# Patient Record
Sex: Male | Born: 1935 | Race: Black or African American | Hispanic: No | Marital: Married | Smoking: Former smoker
Health system: Southern US, Community
[De-identification: ages and names within clinical notes are randomized; demographics above are authoritative.]

## PROBLEM LIST (undated history)

## (undated) DIAGNOSIS — T7840XA Allergy, unspecified, initial encounter: Secondary | ICD-10-CM

## (undated) DIAGNOSIS — K2970 Gastritis, unspecified, without bleeding: Secondary | ICD-10-CM

## (undated) DIAGNOSIS — K449 Diaphragmatic hernia without obstruction or gangrene: Secondary | ICD-10-CM

## (undated) DIAGNOSIS — R7309 Other abnormal glucose: Secondary | ICD-10-CM

## (undated) DIAGNOSIS — K219 Gastro-esophageal reflux disease without esophagitis: Secondary | ICD-10-CM

## (undated) DIAGNOSIS — C159 Malignant neoplasm of esophagus, unspecified: Secondary | ICD-10-CM

## (undated) DIAGNOSIS — Z9289 Personal history of other medical treatment: Secondary | ICD-10-CM

## (undated) DIAGNOSIS — E785 Hyperlipidemia, unspecified: Secondary | ICD-10-CM

## (undated) DIAGNOSIS — Z9221 Personal history of antineoplastic chemotherapy: Secondary | ICD-10-CM

## (undated) DIAGNOSIS — K5730 Diverticulosis of large intestine without perforation or abscess without bleeding: Secondary | ICD-10-CM

## (undated) DIAGNOSIS — I499 Cardiac arrhythmia, unspecified: Secondary | ICD-10-CM

## (undated) DIAGNOSIS — J449 Chronic obstructive pulmonary disease, unspecified: Secondary | ICD-10-CM

## (undated) DIAGNOSIS — E039 Hypothyroidism, unspecified: Secondary | ICD-10-CM

## (undated) DIAGNOSIS — C61 Malignant neoplasm of prostate: Secondary | ICD-10-CM

## (undated) DIAGNOSIS — F419 Anxiety disorder, unspecified: Secondary | ICD-10-CM

## (undated) DIAGNOSIS — I2510 Atherosclerotic heart disease of native coronary artery without angina pectoris: Secondary | ICD-10-CM

## (undated) DIAGNOSIS — K222 Esophageal obstruction: Secondary | ICD-10-CM

## (undated) DIAGNOSIS — Z923 Personal history of irradiation: Secondary | ICD-10-CM

## (undated) HISTORY — PX: OTHER SURGICAL HISTORY: SHX169

## (undated) HISTORY — DX: Other abnormal glucose: R73.09

## (undated) HISTORY — PX: COLONOSCOPY: SHX174

## (undated) HISTORY — DX: Personal history of irradiation: Z92.3

## (undated) HISTORY — DX: Hypothyroidism, unspecified: E03.9

## (undated) HISTORY — PX: TONSILLECTOMY: SUR1361

## (undated) HISTORY — DX: Allergy, unspecified, initial encounter: T78.40XA

## (undated) HISTORY — DX: Personal history of other medical treatment: Z92.89

## (undated) HISTORY — DX: Diaphragmatic hernia without obstruction or gangrene: K44.9

## (undated) HISTORY — DX: Malignant neoplasm of prostate: C61

## (undated) HISTORY — DX: Chronic obstructive pulmonary disease, unspecified: J44.9

## (undated) HISTORY — DX: Hyperlipidemia, unspecified: E78.5

## (undated) HISTORY — PX: POLYPECTOMY: SHX149

## (undated) HISTORY — DX: Gastritis, unspecified, without bleeding: K29.70

## (undated) HISTORY — DX: Gastro-esophageal reflux disease without esophagitis: K21.9

## (undated) HISTORY — DX: Anxiety disorder, unspecified: F41.9

## (undated) HISTORY — DX: Esophageal obstruction: K22.2

## (undated) HISTORY — DX: Diverticulosis of large intestine without perforation or abscess without bleeding: K57.30

## (undated) HISTORY — DX: Malignant neoplasm of esophagus, unspecified: C15.9

## (undated) HISTORY — DX: Cardiac arrhythmia, unspecified: I49.9

## (undated) HISTORY — DX: Atherosclerotic heart disease of native coronary artery without angina pectoris: I25.10

## (undated) HISTORY — PX: UPPER GASTROINTESTINAL ENDOSCOPY: SHX188

---

## 1998-07-26 ENCOUNTER — Ambulatory Visit (HOSPITAL_COMMUNITY): Admission: RE | Admit: 1998-07-26 | Discharge: 1998-07-26 | Payer: Self-pay | Admitting: Internal Medicine

## 1998-07-26 ENCOUNTER — Encounter: Payer: Self-pay | Admitting: Internal Medicine

## 2001-06-08 ENCOUNTER — Encounter: Payer: Self-pay | Admitting: Internal Medicine

## 2001-09-24 ENCOUNTER — Encounter (INDEPENDENT_AMBULATORY_CARE_PROVIDER_SITE_OTHER): Payer: Self-pay | Admitting: *Deleted

## 2001-10-02 ENCOUNTER — Encounter (INDEPENDENT_AMBULATORY_CARE_PROVIDER_SITE_OTHER): Payer: Self-pay | Admitting: *Deleted

## 2001-10-02 ENCOUNTER — Encounter: Payer: Self-pay | Admitting: Gastroenterology

## 2001-10-07 ENCOUNTER — Ambulatory Visit (HOSPITAL_COMMUNITY): Admission: RE | Admit: 2001-10-07 | Discharge: 2001-10-07 | Payer: Self-pay | Admitting: Gastroenterology

## 2001-10-07 ENCOUNTER — Encounter: Payer: Self-pay | Admitting: Gastroenterology

## 2001-10-27 ENCOUNTER — Encounter (INDEPENDENT_AMBULATORY_CARE_PROVIDER_SITE_OTHER): Payer: Self-pay | Admitting: *Deleted

## 2003-01-29 ENCOUNTER — Inpatient Hospital Stay (HOSPITAL_COMMUNITY): Admission: RE | Admit: 2003-01-29 | Discharge: 2003-01-31 | Payer: Self-pay | Admitting: Orthopedic Surgery

## 2004-02-09 ENCOUNTER — Ambulatory Visit: Payer: Self-pay | Admitting: Internal Medicine

## 2004-02-24 ENCOUNTER — Ambulatory Visit: Payer: Self-pay | Admitting: Internal Medicine

## 2005-02-14 ENCOUNTER — Ambulatory Visit: Payer: Self-pay | Admitting: Internal Medicine

## 2005-02-19 ENCOUNTER — Ambulatory Visit: Payer: Self-pay | Admitting: Internal Medicine

## 2006-02-20 ENCOUNTER — Ambulatory Visit: Payer: Self-pay | Admitting: Internal Medicine

## 2006-02-26 ENCOUNTER — Ambulatory Visit: Payer: Self-pay | Admitting: Internal Medicine

## 2006-02-26 LAB — CONVERTED CEMR LAB
AST: 32 units/L (ref 0–37)
Basophils Absolute: 0 10*3/uL (ref 0.0–0.1)
HDL: 56 mg/dL (ref >39.0)
Ketones, ur: NEGATIVE mg/dL
Leukocytes, UA: NEGATIVE
Lymphocytes Relative: 26.2 % (ref 12.0–46.0)
MCV: 87.7 fL (ref 78.0–100.0)
Monocytes Relative: 10.1 % (ref 3.0–11.0)
Neutro Abs: 3.1 10*3/uL (ref 1.4–7.7)
PSA: 1.28 ng/mL (ref 0.10–4.00)
Platelets: 262 10*3/uL (ref 150–400)
Potassium: 3.8 meq/L (ref 3.5–5.1)
Sodium: 137 meq/L (ref 135–145)
TSH: 2.48 microintl units/mL (ref 0.35–5.50)
Triglyceride fasting, serum: 120 mg/dL (ref 0–149)
pH: 5.5 (ref 5.0–8.0)

## 2006-04-03 ENCOUNTER — Ambulatory Visit: Payer: Self-pay | Admitting: Internal Medicine

## 2006-11-25 ENCOUNTER — Emergency Department (HOSPITAL_COMMUNITY): Admission: EM | Admit: 2006-11-25 | Discharge: 2006-11-25 | Payer: Self-pay | Admitting: Emergency Medicine

## 2006-12-03 ENCOUNTER — Emergency Department (HOSPITAL_COMMUNITY): Admission: EM | Admit: 2006-12-03 | Discharge: 2006-12-03 | Payer: Self-pay | Admitting: Emergency Medicine

## 2007-01-27 ENCOUNTER — Encounter: Payer: Self-pay | Admitting: Internal Medicine

## 2007-01-27 DIAGNOSIS — I10 Essential (primary) hypertension: Secondary | ICD-10-CM | POA: Insufficient documentation

## 2007-01-27 DIAGNOSIS — K5730 Diverticulosis of large intestine without perforation or abscess without bleeding: Secondary | ICD-10-CM | POA: Insufficient documentation

## 2007-01-27 DIAGNOSIS — E785 Hyperlipidemia, unspecified: Secondary | ICD-10-CM

## 2007-02-10 ENCOUNTER — Encounter: Payer: Self-pay | Admitting: Internal Medicine

## 2007-02-28 ENCOUNTER — Ambulatory Visit: Payer: Self-pay | Admitting: Internal Medicine

## 2007-02-28 DIAGNOSIS — J019 Acute sinusitis, unspecified: Secondary | ICD-10-CM | POA: Insufficient documentation

## 2007-02-28 DIAGNOSIS — K219 Gastro-esophageal reflux disease without esophagitis: Secondary | ICD-10-CM | POA: Insufficient documentation

## 2007-03-06 ENCOUNTER — Ambulatory Visit: Payer: Self-pay | Admitting: Internal Medicine

## 2007-03-09 LAB — CONVERTED CEMR LAB
Basophils Relative: 0.7 % (ref 0.0–1.0)
Bilirubin Urine: NEGATIVE
Bilirubin, Direct: 0.2 mg/dL (ref 0.0–0.3)
CO2: 33 meq/L — ABNORMAL HIGH (ref 19–32)
Creatinine, Ser: 1.3 mg/dL (ref 0.4–1.5)
Eosinophils Relative: 6.5 % — ABNORMAL HIGH (ref 0.0–5.0)
GFR calc Af Amer: 70 mL/min
Glucose, Bld: 107 mg/dL — ABNORMAL HIGH (ref 70–99)
HCT: 40 % (ref 39.0–52.0)
Hemoglobin: 13.6 g/dL (ref 13.0–17.0)
Leukocytes, UA: NEGATIVE
Lymphocytes Relative: 24.7 % (ref 12.0–46.0)
Monocytes Absolute: 0.6 10*3/uL (ref 0.2–0.7)
Neutro Abs: 3.2 10*3/uL (ref 1.4–7.7)
Neutrophils Relative %: 58.1 % (ref 43.0–77.0)
Nitrite: NEGATIVE
Potassium: 3.7 meq/L (ref 3.5–5.1)
Specific Gravity, Urine: 1.015 (ref 1.000–1.03)
TSH: 2.15 microintl units/mL (ref 0.35–5.50)
Total Bilirubin: 0.8 mg/dL (ref 0.3–1.2)
Total Protein: 7.7 g/dL (ref 6.0–8.3)
Triglycerides: 168 mg/dL — ABNORMAL HIGH (ref 0–149)
Urobilinogen, UA: 0.2 (ref 0.0–1.0)
WBC: 5.6 10*3/uL (ref 4.5–10.5)

## 2007-05-15 ENCOUNTER — Telehealth: Payer: Self-pay | Admitting: Internal Medicine

## 2007-06-05 ENCOUNTER — Encounter: Payer: Self-pay | Admitting: Internal Medicine

## 2007-09-04 ENCOUNTER — Ambulatory Visit: Payer: Self-pay | Admitting: Internal Medicine

## 2007-09-04 DIAGNOSIS — R739 Hyperglycemia, unspecified: Secondary | ICD-10-CM

## 2007-09-04 DIAGNOSIS — R252 Cramp and spasm: Secondary | ICD-10-CM

## 2008-02-05 ENCOUNTER — Ambulatory Visit: Payer: Self-pay | Admitting: Internal Medicine

## 2008-02-05 LAB — CONVERTED CEMR LAB
ALT: 19 units/L (ref 0–53)
AST: 29 units/L (ref 0–37)
Albumin: 4 g/dL (ref 3.5–5.2)
Alkaline Phosphatase: 66 units/L (ref 39–117)
BUN: 14 mg/dL (ref 6–23)
Bacteria, UA: NEGATIVE
Basophils Relative: 0.5 % (ref 0.0–3.0)
CO2: 31 meq/L (ref 19–32)
Chloride: 99 meq/L (ref 96–112)
Cholesterol: 139 mg/dL (ref 0–200)
Creatinine, Ser: 1.4 mg/dL (ref 0.4–1.5)
Eosinophils Absolute: 0.3 10*3/uL (ref 0.0–0.7)
Eosinophils Relative: 5.4 % — ABNORMAL HIGH (ref 0.0–5.0)
GFR calc non Af Amer: 53 mL/min
Glucose, Bld: 119 mg/dL — ABNORMAL HIGH (ref 70–99)
Hgb A1c MFr Bld: 6.5 % — ABNORMAL HIGH (ref 4.6–6.0)
LDL Cholesterol: 71 mg/dL (ref 0–99)
Lymphocytes Relative: 24.3 % (ref 12.0–46.0)
MCV: 89.7 fL (ref 78.0–100.0)
Monocytes Relative: 8.9 % (ref 3.0–12.0)
Neutrophils Relative %: 60.9 % (ref 43.0–77.0)
Nitrite: NEGATIVE
Platelets: 207 10*3/uL (ref 150–400)
Potassium: 3.9 meq/L (ref 3.5–5.1)
RBC: 4.45 M/uL (ref 4.22–5.81)
Specific Gravity, Urine: 1.015 (ref 1.000–1.03)
Total Protein, Urine: NEGATIVE mg/dL
Urine Glucose: NEGATIVE mg/dL
Urobilinogen, UA: 0.2 (ref 0.0–1.0)
WBC: 5.1 10*3/uL (ref 4.5–10.5)

## 2008-03-04 ENCOUNTER — Ambulatory Visit: Payer: Self-pay | Admitting: Internal Medicine

## 2008-03-04 DIAGNOSIS — J309 Allergic rhinitis, unspecified: Secondary | ICD-10-CM | POA: Insufficient documentation

## 2008-03-16 ENCOUNTER — Telehealth: Payer: Self-pay | Admitting: Internal Medicine

## 2008-03-21 ENCOUNTER — Emergency Department (HOSPITAL_COMMUNITY): Admission: EM | Admit: 2008-03-21 | Discharge: 2008-03-21 | Payer: Self-pay | Admitting: Emergency Medicine

## 2008-03-23 ENCOUNTER — Ambulatory Visit: Payer: Self-pay | Admitting: Internal Medicine

## 2008-03-23 DIAGNOSIS — R21 Rash and other nonspecific skin eruption: Secondary | ICD-10-CM

## 2008-07-08 ENCOUNTER — Encounter: Payer: Self-pay | Admitting: Internal Medicine

## 2008-07-24 DIAGNOSIS — Z9289 Personal history of other medical treatment: Secondary | ICD-10-CM

## 2008-07-24 HISTORY — DX: Personal history of other medical treatment: Z92.89

## 2008-08-19 ENCOUNTER — Encounter (INDEPENDENT_AMBULATORY_CARE_PROVIDER_SITE_OTHER): Payer: Self-pay | Admitting: *Deleted

## 2008-08-20 ENCOUNTER — Encounter: Payer: Self-pay | Admitting: Internal Medicine

## 2008-08-24 HISTORY — PX: CARDIAC CATHETERIZATION: SHX172

## 2008-08-31 ENCOUNTER — Encounter: Admission: RE | Admit: 2008-08-31 | Discharge: 2008-08-31 | Payer: Self-pay | Admitting: Cardiovascular Disease

## 2008-09-02 ENCOUNTER — Ambulatory Visit: Payer: Self-pay | Admitting: Internal Medicine

## 2008-09-08 ENCOUNTER — Ambulatory Visit (HOSPITAL_COMMUNITY): Admission: RE | Admit: 2008-09-08 | Discharge: 2008-09-08 | Payer: Self-pay | Admitting: Cardiovascular Disease

## 2008-09-21 ENCOUNTER — Ambulatory Visit: Payer: Self-pay | Admitting: Internal Medicine

## 2008-09-30 ENCOUNTER — Telehealth: Payer: Self-pay | Admitting: Internal Medicine

## 2008-12-22 ENCOUNTER — Encounter: Payer: Self-pay | Admitting: Internal Medicine

## 2009-03-01 ENCOUNTER — Ambulatory Visit: Payer: Self-pay | Admitting: Internal Medicine

## 2009-03-01 ENCOUNTER — Encounter (INDEPENDENT_AMBULATORY_CARE_PROVIDER_SITE_OTHER): Payer: Self-pay | Admitting: *Deleted

## 2009-03-01 DIAGNOSIS — Z87891 Personal history of nicotine dependence: Secondary | ICD-10-CM

## 2009-03-26 DIAGNOSIS — C159 Malignant neoplasm of esophagus, unspecified: Secondary | ICD-10-CM

## 2009-03-26 HISTORY — DX: Malignant neoplasm of esophagus, unspecified: C15.9

## 2009-03-26 HISTORY — PX: CREATION OF CUTANEOUS STOMA: SHX1411

## 2009-04-06 ENCOUNTER — Ambulatory Visit: Payer: Self-pay | Admitting: Gastroenterology

## 2009-04-06 ENCOUNTER — Encounter (INDEPENDENT_AMBULATORY_CARE_PROVIDER_SITE_OTHER): Payer: Self-pay | Admitting: *Deleted

## 2009-04-06 DIAGNOSIS — R634 Abnormal weight loss: Secondary | ICD-10-CM | POA: Insufficient documentation

## 2009-04-06 DIAGNOSIS — K222 Esophageal obstruction: Secondary | ICD-10-CM | POA: Insufficient documentation

## 2009-04-06 DIAGNOSIS — R1319 Other dysphagia: Secondary | ICD-10-CM

## 2009-04-07 ENCOUNTER — Ambulatory Visit: Payer: Self-pay | Admitting: Gastroenterology

## 2009-04-08 ENCOUNTER — Ambulatory Visit: Payer: Self-pay | Admitting: Internal Medicine

## 2009-04-10 LAB — CONVERTED CEMR LAB
ALT: 16 units/L (ref 0–53)
AST: 19 units/L (ref 0–37)
Alkaline Phosphatase: 57 units/L (ref 39–117)
Basophils Absolute: 0.1 10*3/uL (ref 0.0–0.1)
Creatinine, Ser: 1.1 mg/dL (ref 0.4–1.5)
Eosinophils Absolute: 0.1 10*3/uL (ref 0.0–0.7)
HCT: 34 % — ABNORMAL LOW (ref 39.0–52.0)
Hemoglobin: 11.3 g/dL — ABNORMAL LOW (ref 13.0–17.0)
Lymphocytes Relative: 17.1 % (ref 12.0–46.0)
Lymphs Abs: 1.6 10*3/uL (ref 0.7–4.0)
MCHC: 33.2 g/dL (ref 30.0–36.0)
Neutro Abs: 6.7 10*3/uL (ref 1.4–7.7)
Platelets: 383 10*3/uL (ref 150.0–400.0)
Prothrombin Time: 13.6 s — ABNORMAL HIGH (ref 9.1–11.7)
RDW: 13.1 % (ref 11.5–14.6)
Sodium: 130 meq/L — ABNORMAL LOW (ref 135–145)
Total Bilirubin: 0.7 mg/dL (ref 0.3–1.2)

## 2009-04-11 ENCOUNTER — Telehealth: Payer: Self-pay | Admitting: Gastroenterology

## 2009-04-12 ENCOUNTER — Ambulatory Visit: Payer: Self-pay | Admitting: Oncology

## 2009-04-13 ENCOUNTER — Ambulatory Visit: Payer: Self-pay | Admitting: Gastroenterology

## 2009-04-18 ENCOUNTER — Encounter: Payer: Self-pay | Admitting: Gastroenterology

## 2009-04-20 ENCOUNTER — Ambulatory Visit: Admission: RE | Admit: 2009-04-20 | Discharge: 2009-06-16 | Payer: Self-pay | Admitting: Radiation Oncology

## 2009-04-20 ENCOUNTER — Ambulatory Visit (HOSPITAL_COMMUNITY): Admission: RE | Admit: 2009-04-20 | Discharge: 2009-04-20 | Payer: Self-pay | Admitting: Oncology

## 2009-04-21 ENCOUNTER — Encounter: Payer: Self-pay | Admitting: Internal Medicine

## 2009-04-26 ENCOUNTER — Encounter: Payer: Self-pay | Admitting: Gastroenterology

## 2009-04-28 LAB — CBC WITH DIFFERENTIAL/PLATELET
BASO%: 1.7 % (ref 0.0–2.0)
EOS%: 3.9 % (ref 0.0–7.0)
LYMPH%: 15.7 % (ref 14.0–49.0)
MCH: 29.5 pg (ref 27.2–33.4)
MCHC: 33.7 g/dL (ref 32.0–36.0)
MCV: 87.6 fL (ref 79.3–98.0)
MONO#: 0.9 10*3/uL (ref 0.1–0.9)
MONO%: 10.6 % (ref 0.0–14.0)
NEUT%: 68.1 % (ref 39.0–75.0)
Platelets: 384 10*3/uL (ref 140–400)
RBC: 3.63 10*6/uL — ABNORMAL LOW (ref 4.20–5.82)
WBC: 8.2 10*3/uL (ref 4.0–10.3)

## 2009-04-28 LAB — COMPREHENSIVE METABOLIC PANEL
ALT: 10 U/L (ref 0–53)
Alkaline Phosphatase: 61 U/L (ref 39–117)
Creatinine, Ser: 1.21 mg/dL (ref 0.40–1.50)
Sodium: 132 mEq/L — ABNORMAL LOW (ref 135–145)
Total Bilirubin: 0.2 mg/dL — ABNORMAL LOW (ref 0.3–1.2)
Total Protein: 7.8 g/dL (ref 6.0–8.3)

## 2009-05-06 ENCOUNTER — Ambulatory Visit (HOSPITAL_COMMUNITY): Admission: RE | Admit: 2009-05-06 | Discharge: 2009-05-07 | Payer: Self-pay | Admitting: Surgery

## 2009-05-06 ENCOUNTER — Encounter (INDEPENDENT_AMBULATORY_CARE_PROVIDER_SITE_OTHER): Payer: Self-pay | Admitting: *Deleted

## 2009-05-10 ENCOUNTER — Encounter: Payer: Self-pay | Admitting: Gastroenterology

## 2009-05-10 LAB — COMPREHENSIVE METABOLIC PANEL
Albumin: 3.1 g/dL — ABNORMAL LOW (ref 3.5–5.2)
Alkaline Phosphatase: 52 U/L (ref 39–117)
BUN: 18 mg/dL (ref 6–23)
CO2: 31 mEq/L (ref 19–32)
Glucose, Bld: 124 mg/dL — ABNORMAL HIGH (ref 70–99)
Potassium: 4.1 mEq/L (ref 3.5–5.3)
Sodium: 132 mEq/L — ABNORMAL LOW (ref 135–145)
Total Protein: 7.7 g/dL (ref 6.0–8.3)

## 2009-05-10 LAB — CBC WITH DIFFERENTIAL/PLATELET
Basophils Absolute: 0 10*3/uL (ref 0.0–0.1)
Eosinophils Absolute: 0.2 10*3/uL (ref 0.0–0.5)
HGB: 10.4 g/dL — ABNORMAL LOW (ref 13.0–17.1)
LYMPH%: 7.8 % — ABNORMAL LOW (ref 14.0–49.0)
MCV: 84 fL (ref 79.3–98.0)
MONO#: 0.3 10*3/uL (ref 0.1–0.9)
MONO%: 4.4 % (ref 0.0–14.0)
NEUT#: 4.7 10*3/uL (ref 1.5–6.5)
Platelets: 366 10*3/uL (ref 140–400)
RBC: 3.74 10*6/uL — ABNORMAL LOW (ref 4.20–5.82)
RDW: 13.3 % (ref 11.0–14.6)
WBC: 5.7 10*3/uL (ref 4.0–10.3)

## 2009-05-12 ENCOUNTER — Ambulatory Visit: Payer: Self-pay | Admitting: Oncology

## 2009-05-17 LAB — CBC WITH DIFFERENTIAL/PLATELET
BASO%: 0.3 % (ref 0.0–2.0)
Basophils Absolute: 0 10*3/uL (ref 0.0–0.1)
EOS%: 2 % (ref 0.0–7.0)
Eosinophils Absolute: 0.1 10*3/uL (ref 0.0–0.5)
HCT: 33.3 % — ABNORMAL LOW (ref 38.4–49.9)
HGB: 11.3 g/dL — ABNORMAL LOW (ref 13.0–17.1)
LYMPH%: 13.2 % — ABNORMAL LOW (ref 14.0–49.0)
MCH: 29.2 pg (ref 27.2–33.4)
MCHC: 33.8 g/dL (ref 32.0–36.0)
MCV: 86.4 fL (ref 79.3–98.0)
MONO#: 0.3 10*3/uL (ref 0.1–0.9)
MONO%: 10.5 % (ref 0.0–14.0)
NEUT#: 1.9 10*3/uL (ref 1.5–6.5)
NEUT%: 74 % (ref 39.0–75.0)
Platelets: 285 10*3/uL (ref 140–400)
RBC: 3.86 10*6/uL — ABNORMAL LOW (ref 4.20–5.82)
RDW: 14.1 % (ref 11.0–14.6)
WBC: 2.6 10*3/uL — ABNORMAL LOW (ref 4.0–10.3)
lymph#: 0.3 10*3/uL — ABNORMAL LOW (ref 0.9–3.3)

## 2009-05-18 ENCOUNTER — Encounter: Payer: Self-pay | Admitting: Gastroenterology

## 2009-05-24 ENCOUNTER — Encounter: Payer: Self-pay | Admitting: Gastroenterology

## 2009-05-24 LAB — CBC WITH DIFFERENTIAL/PLATELET
Basophils Absolute: 0 10*3/uL (ref 0.0–0.1)
EOS%: 1.7 % (ref 0.0–7.0)
HCT: 32.9 % — ABNORMAL LOW (ref 38.4–49.9)
HGB: 11.1 g/dL — ABNORMAL LOW (ref 13.0–17.1)
MCH: 29.4 pg (ref 27.2–33.4)
MONO#: 0.2 10*3/uL (ref 0.1–0.9)
NEUT#: 1.6 10*3/uL (ref 1.5–6.5)
NEUT%: 79.7 % — ABNORMAL HIGH (ref 39.0–75.0)
RDW: 14.2 % (ref 11.0–14.6)
WBC: 2 10*3/uL — ABNORMAL LOW (ref 4.0–10.3)
lymph#: 0.1 10*3/uL — ABNORMAL LOW (ref 0.9–3.3)

## 2009-05-24 LAB — COMPREHENSIVE METABOLIC PANEL
ALT: 16 U/L (ref 0–53)
AST: 22 U/L (ref 0–37)
Albumin: 3.8 g/dL (ref 3.5–5.2)
BUN: 20 mg/dL (ref 6–23)
CO2: 31 mEq/L (ref 19–32)
Calcium: 9.7 mg/dL (ref 8.4–10.5)
Chloride: 91 mEq/L — ABNORMAL LOW (ref 96–112)
Creatinine, Ser: 0.89 mg/dL (ref 0.40–1.50)
Potassium: 4.3 mEq/L (ref 3.5–5.3)

## 2009-05-25 ENCOUNTER — Encounter: Payer: Self-pay | Admitting: Internal Medicine

## 2009-05-26 ENCOUNTER — Ambulatory Visit (HOSPITAL_COMMUNITY): Admission: RE | Admit: 2009-05-26 | Discharge: 2009-05-26 | Payer: Self-pay | Admitting: Surgery

## 2009-05-31 LAB — CBC WITH DIFFERENTIAL/PLATELET
BASO%: 1.4 % (ref 0.0–2.0)
EOS%: 2.8 % (ref 0.0–7.0)
HGB: 9.9 g/dL — ABNORMAL LOW (ref 13.0–17.1)
MCH: 28.3 pg (ref 27.2–33.4)
MCHC: 34.3 g/dL (ref 32.0–36.0)
MONO#: 0.2 10*3/uL (ref 0.1–0.9)
RDW: 15.1 % — ABNORMAL HIGH (ref 11.0–14.6)
WBC: 1.4 10*3/uL — ABNORMAL LOW (ref 4.0–10.3)
lymph#: 0.1 10*3/uL — ABNORMAL LOW (ref 0.9–3.3)
nRBC: 0 % (ref 0–0)

## 2009-06-03 ENCOUNTER — Ambulatory Visit: Payer: Self-pay | Admitting: Oncology

## 2009-06-03 ENCOUNTER — Inpatient Hospital Stay (HOSPITAL_COMMUNITY): Admission: EM | Admit: 2009-06-03 | Discharge: 2009-06-07 | Payer: Self-pay | Admitting: Emergency Medicine

## 2009-06-13 ENCOUNTER — Ambulatory Visit: Payer: Self-pay | Admitting: Oncology

## 2009-06-13 ENCOUNTER — Ambulatory Visit: Payer: Self-pay | Admitting: Internal Medicine

## 2009-06-13 DIAGNOSIS — R000 Tachycardia, unspecified: Secondary | ICD-10-CM

## 2009-06-13 DIAGNOSIS — C154 Malignant neoplasm of middle third of esophagus: Secondary | ICD-10-CM | POA: Insufficient documentation

## 2009-06-13 DIAGNOSIS — D509 Iron deficiency anemia, unspecified: Secondary | ICD-10-CM | POA: Insufficient documentation

## 2009-06-13 DIAGNOSIS — R509 Fever, unspecified: Secondary | ICD-10-CM | POA: Insufficient documentation

## 2009-06-13 LAB — COMPREHENSIVE METABOLIC PANEL
ALT: 16 U/L (ref 0–53)
Alkaline Phosphatase: 81 U/L (ref 39–117)
Glucose, Bld: 139 mg/dL — ABNORMAL HIGH (ref 70–99)
Sodium: 130 mEq/L — ABNORMAL LOW (ref 135–145)
Total Bilirubin: 0.3 mg/dL (ref 0.3–1.2)
Total Protein: 7 g/dL (ref 6.0–8.3)

## 2009-06-13 LAB — CBC WITH DIFFERENTIAL/PLATELET
BASO%: 0 % (ref 0.0–2.0)
LYMPH%: 7.5 % — ABNORMAL LOW (ref 14.0–49.0)
MCHC: 34.1 g/dL (ref 32.0–36.0)
MCV: 88.8 fL (ref 79.3–98.0)
MONO%: 15.4 % — ABNORMAL HIGH (ref 0.0–14.0)
Platelets: 336 10*3/uL (ref 140–400)
RBC: 3.27 10*6/uL — ABNORMAL LOW (ref 4.20–5.82)

## 2009-06-14 ENCOUNTER — Encounter: Payer: Self-pay | Admitting: Internal Medicine

## 2009-06-14 LAB — CBC WITH DIFFERENTIAL/PLATELET
Eosinophils Absolute: 0 10*3/uL (ref 0.0–0.5)
HCT: 29.4 % — ABNORMAL LOW (ref 38.4–49.9)
LYMPH%: 11.6 % — ABNORMAL LOW (ref 14.0–49.0)
MCV: 86.2 fL (ref 79.3–98.0)
MONO%: 15.2 % — ABNORMAL HIGH (ref 0.0–14.0)
NEUT#: 3.2 10*3/uL (ref 1.5–6.5)
NEUT%: 72.2 % (ref 39.0–75.0)
Platelets: 289 10*3/uL (ref 140–400)
RBC: 3.41 10*6/uL — ABNORMAL LOW (ref 4.20–5.82)

## 2009-06-24 HISTORY — PX: TRANSTHORACIC ECHOCARDIOGRAM: SHX275

## 2009-06-27 ENCOUNTER — Encounter: Payer: Self-pay | Admitting: Gastroenterology

## 2009-06-27 LAB — CBC WITH DIFFERENTIAL/PLATELET
Eosinophils Absolute: 0.1 10*3/uL (ref 0.0–0.5)
HCT: 30.4 % — ABNORMAL LOW (ref 38.4–49.9)
HGB: 10.4 g/dL — ABNORMAL LOW (ref 13.0–17.1)
LYMPH%: 8.8 % — ABNORMAL LOW (ref 14.0–49.0)
MONO#: 0.7 10*3/uL (ref 0.1–0.9)
NEUT#: 3.5 10*3/uL (ref 1.5–6.5)
NEUT%: 72.9 % (ref 39.0–75.0)
Platelets: 321 10*3/uL (ref 140–400)
WBC: 4.8 10*3/uL (ref 4.0–10.3)
lymph#: 0.4 10*3/uL — ABNORMAL LOW (ref 0.9–3.3)

## 2009-06-27 LAB — BASIC METABOLIC PANEL
CO2: 26 mEq/L (ref 19–32)
Calcium: 9.9 mg/dL (ref 8.4–10.5)
Chloride: 90 mEq/L — ABNORMAL LOW (ref 96–112)
Creatinine, Ser: 0.82 mg/dL (ref 0.40–1.50)
Glucose, Bld: 120 mg/dL — ABNORMAL HIGH (ref 70–99)

## 2009-07-01 ENCOUNTER — Ambulatory Visit (HOSPITAL_COMMUNITY): Admission: RE | Admit: 2009-07-01 | Discharge: 2009-07-01 | Payer: Self-pay | Admitting: Oncology

## 2009-07-04 ENCOUNTER — Encounter: Payer: Self-pay | Admitting: Internal Medicine

## 2009-07-05 ENCOUNTER — Ambulatory Visit (HOSPITAL_COMMUNITY): Admission: RE | Admit: 2009-07-05 | Discharge: 2009-07-05 | Payer: Self-pay | Admitting: Oncology

## 2009-07-06 LAB — URINALYSIS, MICROSCOPIC - CHCC
Bilirubin (Urine): NEGATIVE
Blood: NEGATIVE
Ketones: NEGATIVE mg/dL
Protein: 30 mg/dL
Specific Gravity, Urine: 1.005 (ref 1.003–1.035)
pH: 8 (ref 4.6–8.0)

## 2009-07-06 LAB — CBC WITH DIFFERENTIAL/PLATELET
BASO%: 0.1 % (ref 0.0–2.0)
EOS%: 5.8 % (ref 0.0–7.0)
HCT: 30.5 % — ABNORMAL LOW (ref 38.4–49.9)
MCH: 30.1 pg (ref 27.2–33.4)
MCHC: 34 g/dL (ref 32.0–36.0)
MCV: 88.5 fL (ref 79.3–98.0)
MONO%: 7.9 % (ref 0.0–14.0)
NEUT%: 73.7 % (ref 39.0–75.0)
lymph#: 0.7 10*3/uL — ABNORMAL LOW (ref 0.9–3.3)

## 2009-07-06 LAB — COMPREHENSIVE METABOLIC PANEL
ALT: 20 U/L (ref 0–53)
AST: 25 U/L (ref 0–37)
Alkaline Phosphatase: 89 U/L (ref 39–117)
BUN: 18 mg/dL (ref 6–23)
Calcium: 9.7 mg/dL (ref 8.4–10.5)
Chloride: 89 mEq/L — ABNORMAL LOW (ref 96–112)
Creatinine, Ser: 0.83 mg/dL (ref 0.40–1.50)
Total Bilirubin: 0.3 mg/dL (ref 0.3–1.2)

## 2009-07-07 ENCOUNTER — Encounter: Payer: Self-pay | Admitting: Internal Medicine

## 2009-07-07 LAB — URINE CULTURE

## 2009-07-11 ENCOUNTER — Encounter: Payer: Self-pay | Admitting: Gastroenterology

## 2009-07-14 ENCOUNTER — Ambulatory Visit: Payer: Self-pay | Admitting: Oncology

## 2009-07-15 ENCOUNTER — Encounter: Payer: Self-pay | Admitting: Gastroenterology

## 2009-07-15 ENCOUNTER — Ambulatory Visit (HOSPITAL_COMMUNITY): Admission: RE | Admit: 2009-07-15 | Discharge: 2009-07-15 | Payer: Self-pay | Admitting: Oncology

## 2009-07-19 ENCOUNTER — Encounter: Payer: Self-pay | Admitting: Gastroenterology

## 2009-08-03 ENCOUNTER — Ambulatory Visit (HOSPITAL_COMMUNITY): Admission: RE | Admit: 2009-08-03 | Discharge: 2009-08-03 | Payer: Self-pay | Admitting: Radiation Oncology

## 2009-08-04 ENCOUNTER — Encounter: Payer: Self-pay | Admitting: Gastroenterology

## 2009-08-04 ENCOUNTER — Telehealth: Payer: Self-pay | Admitting: Gastroenterology

## 2009-08-05 ENCOUNTER — Ambulatory Visit: Payer: Self-pay | Admitting: Gastroenterology

## 2009-08-10 ENCOUNTER — Encounter: Payer: Self-pay | Admitting: Gastroenterology

## 2009-08-15 ENCOUNTER — Telehealth (INDEPENDENT_AMBULATORY_CARE_PROVIDER_SITE_OTHER): Payer: Self-pay | Admitting: *Deleted

## 2009-08-15 ENCOUNTER — Emergency Department (HOSPITAL_COMMUNITY): Admission: EM | Admit: 2009-08-15 | Discharge: 2009-08-15 | Payer: Self-pay | Admitting: Emergency Medicine

## 2009-08-17 ENCOUNTER — Ambulatory Visit: Payer: Self-pay | Admitting: Internal Medicine

## 2009-08-17 DIAGNOSIS — R259 Unspecified abnormal involuntary movements: Secondary | ICD-10-CM | POA: Insufficient documentation

## 2009-08-24 ENCOUNTER — Ambulatory Visit: Payer: Self-pay | Admitting: Gastroenterology

## 2009-08-31 ENCOUNTER — Ambulatory Visit: Payer: Self-pay | Admitting: Oncology

## 2009-09-02 ENCOUNTER — Encounter: Payer: Self-pay | Admitting: Gastroenterology

## 2009-09-14 ENCOUNTER — Telehealth: Payer: Self-pay | Admitting: Gastroenterology

## 2009-09-24 ENCOUNTER — Emergency Department (HOSPITAL_COMMUNITY): Admission: EM | Admit: 2009-09-24 | Discharge: 2009-09-24 | Payer: Self-pay | Admitting: Emergency Medicine

## 2009-09-30 ENCOUNTER — Ambulatory Visit: Payer: Self-pay | Admitting: Oncology

## 2009-09-30 ENCOUNTER — Encounter: Payer: Self-pay | Admitting: Gastroenterology

## 2009-10-24 ENCOUNTER — Ambulatory Visit: Payer: Self-pay | Admitting: Internal Medicine

## 2009-12-19 ENCOUNTER — Telehealth: Payer: Self-pay | Admitting: Internal Medicine

## 2009-12-20 ENCOUNTER — Ambulatory Visit: Payer: Self-pay | Admitting: Internal Medicine

## 2009-12-20 DIAGNOSIS — L723 Sebaceous cyst: Secondary | ICD-10-CM

## 2009-12-27 ENCOUNTER — Ambulatory Visit: Payer: Self-pay | Admitting: Oncology

## 2010-01-12 ENCOUNTER — Encounter: Payer: Self-pay | Admitting: Internal Medicine

## 2010-01-23 ENCOUNTER — Encounter: Payer: Self-pay | Admitting: Internal Medicine

## 2010-02-10 ENCOUNTER — Ambulatory Visit: Payer: Self-pay | Admitting: Internal Medicine

## 2010-02-17 ENCOUNTER — Ambulatory Visit: Payer: Self-pay | Admitting: Oncology

## 2010-02-24 ENCOUNTER — Ambulatory Visit: Payer: Self-pay | Admitting: Internal Medicine

## 2010-02-24 DIAGNOSIS — R059 Cough, unspecified: Secondary | ICD-10-CM | POA: Insufficient documentation

## 2010-02-24 DIAGNOSIS — R05 Cough: Secondary | ICD-10-CM

## 2010-02-24 DIAGNOSIS — J441 Chronic obstructive pulmonary disease with (acute) exacerbation: Secondary | ICD-10-CM | POA: Insufficient documentation

## 2010-03-16 ENCOUNTER — Telehealth: Payer: Self-pay | Admitting: Internal Medicine

## 2010-04-13 ENCOUNTER — Ambulatory Visit: Payer: Self-pay | Admitting: Oncology

## 2010-04-13 ENCOUNTER — Encounter: Payer: Self-pay | Admitting: Internal Medicine

## 2010-04-16 ENCOUNTER — Encounter: Payer: Self-pay | Admitting: Oncology

## 2010-04-17 ENCOUNTER — Encounter: Payer: Self-pay | Admitting: Internal Medicine

## 2010-04-23 LAB — CONVERTED CEMR LAB
ALT: 20 units/L (ref 0–53)
AST: 31 units/L (ref 0–37)
Hgb A1c MFr Bld: 6.8 % — ABNORMAL HIGH (ref 4.6–6.5)
Total Bilirubin: 0.7 mg/dL (ref 0.3–1.2)
Total Protein: 7.8 g/dL (ref 6.0–8.3)

## 2010-04-25 NOTE — Letter (Signed)
[facility]   Imported By: Sherian Rein 05/06/2009 16:13:27  _____________________________________________________________________  External Attachment:    Type:   Image     Comment:   External Document

## 2010-04-25 NOTE — Letter (Signed)
[facility]   Imported By: Lester [place] 07/19/2009 08:21:20  _____________________________________________________________________  External Attachment:    Type:   Image     Comment:   External Document

## 2010-04-25 NOTE — Op Note (Signed)
Summary: Diagnostic laparoscopy and PEG placement  NAME:  Aaron [name], Aaron [name]             ACCOUNT NO.:  [id number]      MEDICAL RECORD NO.:  [id number]          PATIENT TYPE:  AMB      LOCATION:  DAY                          FACILITY:  [facility]      PHYSICIAN:  Ardeth Sportsman, MD     DATE OF BIRTH:  06/23/1935      DATE OF PROCEDURE:  05/06/2009   DATE OF DISCHARGE:                                  OPERATIVE REPORT      PRIMARY CARE PHYSICIAN:  Dr. Posey Rea.      ONCOLOGIST:  Dr. Nena Alexander.      GASTROENTEROLOGIST:  Dr. Claudette Head.      THORACIC ONCOLOGIC SURGEON:  Dr. Karle Plumber.      RADIATION ONCOLOGIST:  Dr. Marveen Reeks.      SURGEON:  Dr. Karie Soda.      ASSISTANT:  R.N.      PREOPERATIVE DIAGNOSES:  Squamous cell carcinoma of the mid-esophagus   with possible lymphadenopathy and worsening dysphagia.      POSTOPERATIVE DIAGNOSES:  Squamous cell carcinoma of the mid-esophagus   with possible lymphadenopathy and worsening dysphagia.      PROCEDURE PERFORMED:   1. Diagnostic laparoscopy.   2. Laparoscopic ligation of the short gastric and right       gastric blood vessels.   3. Excisional biopsy of retroperitoneal lymph nodes along the right       gastric artery.   4. Placement of an 18-French feeding tube and MIC bolus feeding tube.      ANESTHESIA:   1. General anesthesia.   2. Local anesthetic and field block around the port sites.      ESTIMATED BLOOD LOSS:  Less than 20 mL.      COMPLICATIONS:  None apparent.      INDICATIONS:  Aaron [name] is a pleasant 75 year old gentleman with   worsening symptoms of dysphasia and found by Dr. Russella Dar on endoscopy to   have an obstructing mass in the esophagus.  The biopsy came back   suspicious for squamous cell carcinoma.  He has had evaluations with CAT   scan and suspicion of possible mediastinal lymphadenopathy.      Recommendations were made for neoadjuvant chemoradiation therapy with   followup, to  see if he is a potentially resectable candidate, as he is   not at this time.  The placement of feeding jejunostomy was requested to   tolerate these procedures, as he has poor p.o. tolerance.  The patient   was sent to be.  I recommend diagnostic laparoscopy.  I also recommend   consideration of the ligation of some of the gastric vessels to beef up   his gastroepiploic vessels on the greater curvature, to help beef that   up and decrease the risk of an anastomotic leak, should the patient   undergo esophagogastrectomy with gastric pull-up.      INFORMED CONSENT:  The risks, benefits and alternatives were discussed.   Questions answered and he agreed to proceed.  An informed consent  was   obtained.      OPERATIVE FINDINGS:  He had no diffuse evidence of metastatic disease.   He had some mildly enlarged lymph nodes along his right gastric artery   but not severely suspicious for cancer.  He has clips on the right   gastric artery vessels.  It is an 18-French MIC feeding tube.      DESCRIPTION OF PROCEDURE:  The patient received IV clindamycin and   gentamicin, given his AMOXICILLIN allergy.  He voided just prior to   being taken to the operating room.  He underwent general anesthesia   without any difficulty.  He was positioned supine and a split leg with   the arms tucked.  His abdomen was prepped and draped in a sterile   fashion.  A surgical time-out confirmed our plan.  He had sequential   compression devices active during the entire case.      I placed a 5 mm port in the left upper quadrant using optical entry   technique with the patient in deep Trendelenburg  with the left side up.   This was the site of the anticipated jejunostomy tube site.  Camera   inspection revealed no evidence of diffuse metastatic disease or   peritoneal studding.  I placed 5 mm ports in the left subxiphoid region   and removed that and used an Omega shaped retractor to lift the left   lateral sector of  the liver anteriorly, to better expose the esophageal   hiatus.  Again, I could not see evidence of any diffuse metastatic   disease.  I placed a final port in through the umbilicus and later up-   sized that to a 10 mm port and placed our final ports in the right lower   quadrant and right mid-abdomen.      Camera inspection again revealed no evidence of any disease on the   visceral peritoneal surfaces.  Because of this, therefore I was hopeful   that he could be potentially resectable after neoadjuvant chemoradiation   therapy, I went ahead and made efforts to help beef up his   gastroepiploic vessel on the greater curvature.  Therefore I freed the   greater omentum off the stomach, staying about 5 cm off the greater   curvature, and his structures were thin enough that I could easily see   the gastroepiploic vessel running, and I stayed lateral to that.  I   followed that all the way up from the antrum proximally up to the   fundus, and freed off the splenic artery vessels. I did that until I got up to the level of the   crura.  I did avoid any over-dissection there.  I freed the greater   curvature more distally, until I got into the distal antrum.      Next, I elevated the posterior gastric wall anteriorly and followed the   posterior stomach over towards the lesser curvature.  I did a careful   dissection and easily isolated the right gastric artery and vein.  I was   able to lay the stomach flat and opened the gastrohepatic ligament and   see that again anteriorly.  There was no replaced right vessels or any   abnormalities.  I skeletonized these and ligated these using clips.   While I was looking there I could see some mildly enlarged lymph nodes   running along the right hepatic vessels, right gastric vessels.  Therefore I took those using ultrasonic dissection and placed this in an   EndoCatch bag removed that and sent that off for specimen.      Next, I turned attention  towards placement of the feeding jejunostomy   tube.  I reflected the transverse colon and omentum cephalad.  I easily   identified the ligament of Treitz and measured down 30 cm distal to the   ligament of Treitz.  I secured this lobe to the anterior abdominal wall   and the left upper quadrant for port site, using a 2-0 Vicryl stitch,   using extra portal suturing to help hold it up.  I placed 2-0 silk   interrupted stitches x4 around the edges.      Next, I took initially a #18 gauge, and then upgraded to a #14 gauge   Angiocath, and punctured through the abdominal wall port site, into the   jejunum.  I placed a glide wire and easily fed distally.  I used a 20-   [place] dilator with sheath and placed that into the small bowel over the   wire using a Seldinger technique without any difficulty.  I removed the   dilator out.  I took the 18-French MIC-Key fedding tube and cut the balloon off,   and advanced that inside the sheath.  It was a little stiff in there,   because I think the initial balloon tip had increased the diameter, but   eventually I was able to get it in, and once I got it through the distal   end of the sheath, it fed very easily.  I peeled the sheath away.  The   catheter flushed and aspirated well.  I closed the jejunum around the   opening by wrapping 2-0 silk around the tube itself and tying it down   for an internal tie, to keep it from being accidently pulled out.  I   also placed two more stitches between the pins and the tube, so that   there was six stitches circumferentially covering around the jejunostomy   tube site.  Again, I flushed well.      On camera inspection I found no other abnormalities.  Hemostasis was   excellent up in the spleen and other areas.  I evacuated the   capnoperitoneum and removed the ports.  I secured the jejunostomy tube   using 2-0 silk interrupted stitches x4 on the flange of the MIC key   apparatus.  I also closed the umbilical  wound using 0 Vicryl stitch.  I   closed the skin using 4-0 Monocryl stitch.  A sterile dressing was   applied.      The patient was extubated and taken to the recovery room in stable   condition.  I am about to discuss the postoperative findings with the   patient's family.               Ardeth Sportsman, MD            SCG/MEDQ  D:  05/06/2009  T:  05/06/2009  Job:  [id number]      cc:   Karle Plumber, M.D.      Mancel Bale, M.D.         Electronically Signed by Karie Soda MD on 05/14/2009 08:08:49 AM

## 2010-04-25 NOTE — Letter (Signed)
Summary: [facility]  [facility]   Imported By: Sherian Rein 02/03/2010 08:03:04  _____________________________________________________________________  External Attachment:    Type:   Image     Comment:   External Document

## 2010-04-25 NOTE — Assessment & Plan Note (Signed)
Summary: 2 MO ROV /NWS  #   Vital Signs:  Patient profile:   75 year old male Height:      72 inches Weight:      186 pounds BMI:     25.32 O2 Sat:      98 % on Room air Temp:     98.2 degrees F oral Pulse rate:   64 / minute Pulse rhythm:   regular Resp:     16 per minute BP sitting:   130 / 62  (left arm) Cuff size:   regular  Vitals Entered By: Lanier Prude, CMA(AAMA) (October 24, 2009 1:41 PM)  O2 Flow:  Room air CC: 39mo f/u Is Patient Diabetic? No Comments pt is not taking Loratadine, Fluticasone, Senna, Colace, Prochlorperazine, Zolpidem, Oxycodone, Carafate or Hydroco/Acetamin.  please remove from list   Primary Care Provider:  Sula Soda, MD   CC:  39mo f/u.  History of Present Illness: The patient presents for a follow up of hypertension, CAD, hyperlipidemia, cramps   Current Medications (verified): 1)  Omeprazole 20 Mg Cpdr (Omeprazole) .Marland Kitchen.. 1 Once Daily 2)  Vitamin D3 1000 Unit  Tabs (Cholecalciferol) .Marland Kitchen.. 1 Qd 3)  Adult Aspirin Low Strength 81 Mg  Tbdp (Aspirin) .... Once Daily 4)  Loratadine 10 Mg  Tabs (Loratadine) .... Once Daily As Needed Allergies 5)  Fluticasone Propionate 50 Mcg/act  Susp (Fluticasone Propionate) .... 2 Sprays Each Nostril Once Daily 6)  Vitamin D 1000 Unit Tabs (Cholecalciferol) .... Once Daily 7)  Senna 187 Mg Tabs (Senna) .... As Needed 8)  Colace 100 Mg Caps (Docusate Sodium) .... As Needed 9)  Prochlorperazine Maleate 10 Mg Tabs (Prochlorperazine Maleate) .... As Needed For Nausea 10)  Zolpidem Tartrate 5 Mg Tabs (Zolpidem Tartrate) .... At Bedtime 11)  Oxycodone Hcl 5 Mg Tabs (Oxycodone Hcl) .Marland Kitchen.. 1-2 Per Tube Bid As Needed 12)  Carafate 1 Gm/76ml Susp (Sucralfate) .... Once Daily 13)  Hydrocodone-Acetaminophen 7.5-500 Mg Tabs (Hydrocodone-Acetaminophen) .... As Needed 14)  Simvastatin 20 Mg Tabs (Simvastatin) .Marland Kitchen.. 1 By Mouth Once Daily For Cholesterol 15)  Omeprazole 40 Mg  Cpdr (Omeprazole) .Marland Kitchen.. 1 Each Day 30 Minutes  Before Meal 16)  Lorazepam 1 Mg Tabs (Lorazepam) .Marland Kitchen.. 1 Every 4 To 6 Hours As Needed Anxiety or Shaking  Allergies (verified): 1)  ! 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    Physical Exam  General:  alert and well-nourished.   Ears:  External ear exam shows no significant lesions or deformities.  Otoscopic examination reveals clear canals, tympanic membranes are intact bilaterally without bulging, retraction, inflammation or discharge. Hearing is grossly normal bilaterally. Nose:  External nasal examination shows no deformity or inflammation. Nasal mucosa are pink and moist without lesions or exudates. Mouth:  Oral mucosa and oropharynx without lesions or exudates.  Teeth in good repair. Neck:  No deformities, masses, or  tenderness noted. Lungs:  Normal respiratory effort, chest expands symmetrically. Lungs are clear to auscultation, no crackles or wheezes. Heart:  Normal rate and regular rhythm. S1 and S2 normal without gallop, murmur, click, rub or other extra sounds. Abdomen:  Bowel sounds positive,abdomen soft and non-tender without masses, organomegaly or hernias noted. Msk:  No deformity or scoliosis noted of thoracic or lumbar spine.   Extremities:  no  edema, no ulcers  Neurologic:  No cranial nerve deficits noted. Station and gait are normal. Plantar reflexes are down-going bilaterally. DTRs are symmetrical throughout. Sensory, motor and coordinative functions appear intact. Skin:  mild rash to lower legs only - o/w no rash to head or torso Psych:  Cognition and judgment appear intact. Alert and cooperative with normal attention span and concentration. No apparent delusions, illusions, hallucinations   Impression & Recommendations:  Problem # 1:  INVOLUNTARY MOVEMENTS, ABNORMAL (ICD-781.0) - resolved Assessment Improved  Problem # 2:  MALIGNANT NEOPLASM OF THORACIC ESOPHAGUS (ICD-150.1) Assessment: Comment Only  Problem # 3:  WEIGHT LOSS-ABNORMAL (ICD-783.21) Assessment: Improved PEG tube got out in the shower 2 months ago - eating OK now  Problem # 4:  TACHYCARDIA (ICD-785.0) Assessment: Improved  Problem # 5:  HYPERTENSION (ICD-401.9) Assessment: Unchanged  BP today: 130/62 Prior BP: 100/64 (08/17/2009)  Labs Reviewed: K+: 4.0 (04/07/2009) Creat: : 1.1 (04/07/2009)   Chol: 139 (02/05/2008)   HDL: 45.3 (02/05/2008)   LDL: 71 (02/05/2008)   TG: 112 (02/05/2008)  Complete Medication List: 1)  Omeprazole 20 Mg Cpdr (Omeprazole) .Marland Kitchen.. 1 once daily 2)  Vitamin D3 1000 Unit Tabs (Cholecalciferol) .Marland Kitchen.. 1 qd 3)  Adult Aspirin Low Strength 81 Mg Tbdp (Aspirin) .... Once daily 4)  Loratadine 10 Mg Tabs (Loratadine) .... Once daily as needed allergies 5)  Fluticasone Propionate 50 Mcg/act Susp (Fluticasone propionate) .... 2 sprays each nostril once daily 6)  Vitamin D 1000 Unit Tabs (Cholecalciferol) .... Once daily 7)  Senna 187 Mg Tabs (Senna) .... As needed 8)  Colace 100 Mg Caps (Docusate sodium) .... As needed 9)  Prochlorperazine Maleate 10 Mg Tabs (Prochlorperazine maleate) .... As needed for nausea 10)  Zolpidem Tartrate 5 Mg Tabs (Zolpidem tartrate) .... At bedtime 11)  Oxycodone Hcl 5 Mg Tabs (Oxycodone hcl) .Marland Kitchen.. 1-2 per  tube bid as needed 12)  Carafate 1 Gm/59ml Susp (Sucralfate) .... Once daily 13)  Hydrocodone-acetaminophen 7.5-500 Mg Tabs (Hydrocodone-acetaminophen) .... As needed 14)  Simvastatin 20 Mg Tabs (Simvastatin) .Marland Kitchen.. 1 by mouth once daily for cholesterol 15)  Omeprazole 40 Mg Cpdr (Omeprazole) .Marland Kitchen.. 1 each day 30 minutes before meal 16)  Lorazepam 1 Mg Tabs (Lorazepam) .Marland Kitchen.. 1 every 4 to 6 hours as needed anxiety or shaking  Patient Instructions: 1)  Please schedule a follow-up appointment in 4 months well w/labs v70.0 and Vit B12 782.0.

## 2010-04-25 NOTE — Letter (Signed)
[facility]   Imported By: Lennie Odor 09/22/2009 14:35:48  _____________________________________________________________________  External Attachment:    Type:   Image     Comment:   External Document

## 2010-04-25 NOTE — Letter (Signed)
[facility]   Imported By: Lester [place] 05/14/2009 10:29:33  _____________________________________________________________________  External Attachment:    Type:   Image     Comment:   External Document

## 2010-04-25 NOTE — Procedures (Signed)
Summary: Upper Endoscopy w/DIL  Patient: Aaron [name] Note: All result statuses are Final unless otherwise noted.  Tests: (1) Upper Endoscopy w/DIL (UED)  UED Upper Endoscopy w/DIL                             DONE (C)     [facility]     [address]     [place], [place]  [address]           ENDOSCOPY PROCEDURE REPORT           PATIENT:  Aaron [name], Aaron [name]  MR#:  [id number]     BIRTHDATE:  May 21, 1935, 73 yrs. old  GENDER:  male           ENDOSCOPIST:  Judie Petit T. Russella Dar, MD, [facility]           PROCEDURE DATE:  04/07/2009     PROCEDURE:  EGD with foreign body removal, EGD with biopsy     ASA CLASS:  Class II     INDICATIONS:  1) dysphagia  2) GERD  3) weight loss           MEDICATIONS:  Fentanyl 75 mcg IV, Versed 9 mg IV     TOPICAL ANESTHETIC:  Exactacain Spray           DESCRIPTION OF PROCEDURE:   After the risks benefits and     alternatives of the procedure were thoroughly explained, informed     consent was obtained.  The [id number] endoscope was     introduced through the mouth and advanced to the second portion of     the duodenum, without limitations.  The instrument was slowly     withdrawn as the mucosa was carefully examined.     <<PROCEDUREIMAGES>>           Retained food was present in the proximal esophagus. The retained     food was removed by suctioning and gently advancing the food into     the stomach.  A mass was found in the proximal esophagus. It was     malignant appearing, friable and partially obstructing. It was 10     cm in length from 20-30cm. Multiple biopsies were obtained and     sent to pathology.  Psuedodiverticulosis was noted in the distal     esophagus. A stricture was found in the distal esophagus which was     benign appearing.  The stomach was entered and closely examined.     The pylorus, antrum, angularis, and lesser curvature were well     visualized, including a retroflexed view of the cardia and fundus.     The  stomach wall was normally distensable. The scope passed easily     through the pylorus into the duodenum.  The duodenal bulb was     normal in appearance, as was the postbulbar duodenum.           COMPLICATIONS:  None           ENDOSCOPIC IMPRESSION:     1) Food, retained in the proximal esophagus     2) Mass in the proximal esophagus     3) Stricture in the distal esophagus     4) Esophageal intramural psuedodiverticulosis           RECOMMENDATIONS:     1) await pathology results     2) Chest and abd CT scan  3) CBC, CMET, PT/PTT today           Vedanshi Massaro T. Russella Dar, MD, [facility]           CC:  Linda Hedges. Plotnikov, M.D.           n.     REVISED:  04/13/2009 12:06 PM     eSIGNED:   Judie Petit T. Ahmaya Ostermiller at 04/13/2009 12:06 PM           [name], [phone]  Note: An exclamation mark (!) indicates a result that was not dispersed into the flowsheet. Document Creation Date: 04/13/2009 12:07 PM _______________________________________________________________________  (1) Order result status: Final Collection or observation date-time: 04/07/2009 09:09 Requested date-time:  Receipt date-time:  Reported date-time:  Referring Physician:   Ordering Physician: Claudette Head [phone]) Specimen Source:  Source: Launa Grill Order Number: [id number] Lab site:

## 2010-04-25 NOTE — Letter (Signed)
[facility]   Imported By: Sherian Rein 08/04/2009 13:23:19  _____________________________________________________________________  External Attachment:    Type:   Image     Comment:   External Document

## 2010-04-25 NOTE — Progress Notes (Signed)
Summary: GI Dietitian  GI Office Note/[facility]   Imported By: Sherian Rein 04/08/2009 08:54:54  _____________________________________________________________________  External Attachment:    Type:   Image     Comment:   External Document

## 2010-04-25 NOTE — Letter (Signed)
[facility]   Imported By: Sherian Rein 08/25/2009 11:04:14  _____________________________________________________________________  External Attachment:    Type:   Image     Comment:   External Document

## 2010-04-25 NOTE — Letter (Signed)
Summary: [facility]  [facility]   Imported By: Lennie Odor 02/07/2010 11:04:15  _____________________________________________________________________  External Attachment:    Type:   Image     Comment:   External Document

## 2010-04-25 NOTE — Letter (Signed)
Summary: [facility]  [facility]   Imported By: Sherian Rein 04/11/2009 09:16:51  _____________________________________________________________________  External Attachment:    Type:   Image     Comment:   External Document

## 2010-04-25 NOTE — Letter (Signed)
[facility]   Imported By: Lennie Odor 06/03/2009 13:55:39  _____________________________________________________________________  External Attachment:    Type:   Image     Comment:   External Document

## 2010-04-25 NOTE — Miscellaneous (Signed)
Summary: Cyst on Neck/[facility]  Cyst on Neck/[facility]   Imported By: Sherian Rein 12/23/2009 09:03:49  _____________________________________________________________________  External Attachment:    Type:   Image     Comment:   External Document

## 2010-04-25 NOTE — Procedures (Signed)
Summary: Upper Endoscopy w/DIL  Patient: Aaron [name] Note: All result statuses are Final unless otherwise noted.  Tests: (1) Upper Endoscopy w/DIL (UED)  UED Upper Endoscopy w/DIL                             DONE (C)     [facility]     [address]     [place], [place]  [address]           ENDOSCOPY PROCEDURE REPORT           PATIENT:  Aaron [name], Aaron [name]  MR#:  [id number]     BIRTHDATE:  Nov 10, 1935, 73 yrs. old  GENDER:  male     ENDOSCOPIST:  Judie Petit T. 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  The LB-GIF-H180 [id number] endoscope was     introduced through the mouth and advanced to the second portion of     the duodenum, without limitations.  The instrument was slowly     withdrawn as the mucosa was carefully examined.     <<PROCEDUREIMAGES>>           A stricture was found in the proximal esophagus. It was focal,     benign appearing and circumferential. Liklely radiation induced.     It was 9 mm in diameter and I was unable to pass the stricture. It     was located at 19 cm from incisors. Following the dilation I was     able to easily advance the endoscope.  Nodular mucosa was found in     the mid esophagus from 24-26 cm. There was focal nodularity with     scarring-R/O residual tumor vs. scar tissue. Distal to the nodular     area there were scattered exudates. Multiple biopsies were     obtained and sent to pathology. The stomach was entered and  closely examined. The pylorus, antrum, angularis, and lesser     curvature were well visualized, including a retroflexed view of     the cardia and fundus. The stomach wall was normally distensable.     The scope passed easily through the pylorus into the duodenum. The     duodenal bulb was normal in appearance, as was the postbulbar     duodenum.  Esophagitis was found in the distal esophagus. It was     erosive.  LA Classication Grade A. Dilation was then performed at     the proximal esophagus:           1) Dilator:  Balloon inflated for 30 seconds  Sizes:  10, 11, 12     mm     Resistance:  minimal  Heme:  none  Appearance:  adequate           COMPLICATIONS: desaturation to 60% following completion of the     procedure: corrected with O2, Narcan and Romazicon           ENDOSCOPIC IMPRESSION:     1) 9 mm stricture in the proximal esophagus     2) Nodular mucosa in the mid esophagus     3) Erosive esophagitis in the distal esophagus           RECOMMENDATIONS:     1) await pathology results     2) PPI qam: omperazole 40 mg po qam, #30, 11 refills     3) post dilation instructions     4) repeat endoscopy with dilation in 2-3 weeks     5) Antireflux measures long term           CC: Aaron Bale, MD           Venita Lick. Russella Dar, MD, [facility]           n.     REVISED:  08/05/2009 01:52 PM     eSIGNED:   Venita Lick. Halaina Vanduzer at 08/05/2009 01:52 PM           [name], [id number]  Note: An exclamation mark (!) indicates a result that was not dispersed into the flowsheet. Document Creation Date: 08/05/2009 1:52 PM _______________________________________________________________________  (1) Order result status: Final Collection or observation date-time: 08/05/2009 11:49 Requested date-time:  Receipt date-time:  Reported date-time:  Referring Physician:   Ordering Physician: Claudette Head [phone]) Specimen Source:  Source: Launa Grill Order Number: [id number] Lab site:    Appended Document: Upper Endoscopy w/DIL EGD was scheduled from the [facility] for 08/24/09 11:30.  new instructions mailed to the patient's home.  Patient 's wife is notified I have canceled the pre-visit that was scheduled for 08/15/09

## 2010-04-25 NOTE — Procedures (Signed)
Summary: Colonoscopy   Colonoscopy  Procedure date:  10/02/2001  Findings:      Results: Hemorrhoids.     Results: Diverticulosis.       Location:  [facility].    Procedures Next Due Date:    Colonoscopy: 09/2006  Colonoscopy  Procedure date:  10/02/2001  Findings:      Results: Hemorrhoids.     Results: Diverticulosis.       Location:  [facility].    Procedures Next Due Date:    Colonoscopy: 09/2006 Patient Name: Aaron [name], Aaron [name] MRN:  Procedure Procedures: Colonoscopy CPT: [address].  Colorectal cancer screening, average risk CPT: G0121.  Personnel: Endoscopist: Ulyess Mort, MD.  Referred By: Linda Hedges Plotnikov, MD.  Exam Location: Exam performed in Outpatient Clinic. Outpatient  Patient Consent: Procedure, Alternatives, Risks and Benefits discussed, consent obtained, from patient. Consent was obtained by the RN.  Indications  Average Risk Screening Routine.  History  Pre-Exam Physical: Performed Oct 02, 2001. Rectal exam, Abdominal exam, Extremity exam, Mental status exam WNL.  Exam Exam: Extent of exam reached: Cecum, extent intended: Cecum.  The cecum was identified by appendiceal orifice and IC valve. Colon retroflexion performed. Images were not taken. ASA Classification: II. Tolerance: good.  Monitoring: Pulse and BP monitoring, Oximetry used. Supplemental O2 given.  Colon Prep Prep results: good.  Sedation Meds: Patient assessed and found to be appropriate for moderate (conscious) sedation. Fentanyl 100 mcg. given IV. Versed 7 mg.  Findings - DIVERTICULOSIS: Transverse Colon to Sigmoid Colon. ICD9: Diverticulosis: 562.10. Comments: MILD TO MODERATE DIVERTICULOSIS.  - NOT SEEN ON EXAM: Cecum to Rectum. Polyps, AVM's, Colitis, Tumors, Melanosis, Crohn's,  - HEMORRHOIDS: Internal and External. Size: Grade II. Not bleeding. ICD9: Hemorrhoids, Internal and  External: 455.6. Comments: HEM. TAGS.    Assessment Abnormal examination, see findings above.  Diagnoses: 562.10: Diverticulosis.  455.6: Hemorrhoids, Internal and  External.   Events  Unplanned Interventions: No intervention was required.  Unplanned Events: There were no complications. Plans Medication Plan: Continue current medications.  Patient Education: Patient given standard instructions for: Diverticulosis. Hemorrhoids. Yearly hemoccult testing recommended. Patient instructed to get routine colonoscopy every 5 years.  Disposition: After procedure patient sent to recovery. After recovery patient sent home.  This report was created from the original endoscopy report, which was reviewed and signed by the above listed endoscopist.    cc: Sonda Primes, MD

## 2010-04-25 NOTE — Letter (Signed)
Summary: EGD Instructions  [facility]  [address] [place], [place] [address]   Phone: [phone]  Fax: [phone]       Aaron [name]    25-Nov-1935    MRN: [id number]       Procedure Day /Date: Thursday January 13th, 2011     Arrival Time:  7:30am     Procedure Time: 8:30am     Location of Procedure:                    _  x_ [facility] (4th Floor)    PREPARATION FOR ENDOSCOPY   On 04/07/09  THE DAY OF THE PROCEDURE:  1.   No solid foods, milk or milk products are allowed after midnight the night before your procedure.  2.   Do not drink anything colored red or purple.  Avoid juices with pulp.  No orange juice.  3.  You may drink clear liquids until  6:30am, which is 2 hours before your procedure.                                                                                                CLEAR LIQUIDS INCLUDE: Water Jello Ice Popsicles Tea (sugar ok, no milk/cream) Powdered fruit flavored drinks Coffee (sugar ok, no milk/cream) Gatorade Juice: apple, white grape, white cranberry  Lemonade Clear bullion, consomm, broth Carbonated beverages (any kind) Strained chicken noodle soup Hard Candy   MEDICATION INSTRUCTIONS  Unless otherwise instructed, you should take regular prescription medications with a small sip of water as early as possible the morning of your procedure.           OTHER INSTRUCTIONS  You will need a responsible adult at least 73 years of age to accompany you and drive you home.   This person must remain in the waiting room during your procedure.  Wear loose fitting clothing that is easily removed.  Leave jewelry and other valuables at home.  However, you may wish to bring a book to read or an iPod/MP3 player to listen to music as you wait for your procedure to start.  Remove all body piercing jewelry and leave at home.  Total time from sign-in until discharge is approximately 2-3 hours.  You should  go home directly after your procedure and rest.  You can resume normal activities the day after your procedure.  The day of your procedure you should not:   Drive   Make legal decisions   Operate machinery   Drink alcohol   Return to work  You will receive specific instructions about eating, activities and medications before you leave.    The above instructions have been reviewed and explained to me by   Marchelle Folks.     I fully understand and can verbalize these instructions _____________________________ Date _________

## 2010-04-25 NOTE — Assessment & Plan Note (Signed)
Summary: DYSPHAGIA/YF   History of Present Illness Visit Type: consult  Primary GI MD: Elie Goody MD [facility] Primary Provider: Sula Soda, MD  Requesting Provider: Sula Soda, MD  Chief Complaint: Dysphagia, loss of appetite, vomiting, acid reflux, and heartburn  History of Present Illness:   This is a 75 year old male previously followed by Dr. Victorino Dike relates worsening problems with solid food dysphasia over the past 18 months. He has induced vomiting on several occasions. He notes problems with solid foods, and, particularly with greasy foods. He notes a loss of appetite, and a 20 pound weight loss over the past year and a half. He notes occasional reflux symptoms. He underwent upper endoscopy and colonoscopy in 2003, by Dr. Victorino Dike showing a hiatal hernia, erosive gastritis, hemorrhoids, and diverticulosis.   GI Review of Systems    Reports acid reflux, belching, bloating, dysphagia with solids, heartburn, loss of appetite, and  weight loss.   Weight loss of 20 pounds   Denies abdominal pain, chest pain, dysphagia with liquids, nausea, vomiting, vomiting blood, and  weight gain.        Denies anal fissure, black tarry stools, change in bowel habit, constipation, diarrhea, diverticulosis, fecal incontinence, heme positive stool, hemorrhoids, irritable bowel syndrome, jaundice, light color stool, liver problems, rectal bleeding, and  rectal pain.   Current Medications (verified): 1)  Hydrochlorothiazide 25 Mg  Tabs (Hydrochlorothiazide) .... 1/2qd 2)  Simvastatin 40 Mg  Tabs (Simvastatin) .Marland Kitchen.. 1 Once Daily 3)  Omeprazole 20 Mg Cpdr (Omeprazole) .Marland Kitchen.. 1 Once Daily 4)  Vitamin D3 1000 Unit  Tabs (Cholecalciferol) .Marland Kitchen.. 1 Qd 5)  Fish Oil 1000 Mg  Caps (Omega-3 Fatty Acids) .Marland Kitchen.. 1 Once Daily 6)  Adult Aspirin Low Strength 81 Mg  Tbdp (Aspirin) .... Once Daily 7)  Loratadine 10 Mg  Tabs (Loratadine) .... Once Daily As Needed Allergies 8)  Fluticasone Propionate 50 Mcg/act   Susp (Fluticasone Propionate) .... 2 Sprays Each Nostril Once Daily 9)  Bystolic 5 Mg Tabs (Nebivolol Hcl) .... Once Daily 10)  Tums 500 Mg Chew (Calcium Carbonate Antacid) .... As Needed  Allergies (verified): 1)  ! 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         The pertinent positives and negatives are noted as above and in the HPI. All other ROS were reviewed and were negative.   Vital Signs:  Patient profile:   75 year old male Height:      72 inches Weight:      174 pounds BMI:     23.68 BSA:     2.01 Pulse rate:   88 / minute Pulse rhythm:   regular BP sitting:   110 / 68  (  left arm) Cuff size:   regular  Vitals Entered By: Ok Anis CMA (April 06, 2009 1:49 PM)  Physical Exam  General:  Well developed, well nourished, no acute distress. Head:  Normocephalic and atraumatic. Eyes:  PERRLA, no icterus. Ears:  Normal auditory acuity. Mouth:  No deformity or lesions, dentition normal. Neck:  Supple; no masses or thyromegaly. Lungs:  Clear throughout to auscultation. Heart:  Regular rate and rhythm; no murmurs, rubs,   or bruits. Abdomen:  Soft, nontender and nondistended. No masses, hepatosplenomegaly or hernias noted. Normal bowel sounds. Msk:  Symmetrical with no gross deformities. Normal posture. Pulses:  Normal pulses noted. Extremities:  No clubbing, cyanosis, edema or deformities noted. Neurologic:  Alert and  oriented x4;  grossly normal neurologically. Cervical Nodes:  No significant cervical adenopathy. Inguinal Nodes:  No significant inguinal adenopathy. Psych:  Alert and cooperative. Normal mood and affect.  Impression & Recommendations:  Problem # 1:  DYSPHAGIA (ICD-787.29) Progresive dysphagia, associated with reflux symptoms, anorexia and weight loss. Rule out an esophageal stricture, upper gastrointestinal tract neoplasms, ulcers, and other disorders. Continue omeprazole 20 mg daily. The risks, benefits and alternatives to endoscopy with possible biopsy and possible dilation were discussed with the patient and they consent to proceed. The procedure will be scheduled electively. Orders: EGD SAV (EGD SAV)  Problem # 2:  WEIGHT LOSS-ABNORMAL (ICD-783.21) If no findings to explain anorexia, and weight loss are noted on upper endoscopy consider further evaluation with abdominal and pelvic CT scan and colonoscopy.  Problem # 3:  GERD (ICD-530.81) Continue omeprazole 20 mg daily, along with standard antireflux measures.  Patient Instructions: 1)  Conscious Sedation brochure given.  2)  Upper Endoscopy with Dilatation brochure given.  3)  Copy sent to : Sonda Primes, MD 4)  The medication list was reviewed and reconciled.  All changed / newly prescribed medications were explained.  A complete medication list was provided to the patient / caregiver. 5)  Avoid foods high in acid content ( tomatoes, citrus juices, spicy foods) . Avoid eating within 3 to 4 hours of lying down or before exercising. Do not over eat; try smaller more frequent meals. Elevate head of bed four inches when sleeping.

## 2010-04-25 NOTE — Procedures (Signed)
Summary: Upper Endoscopy w/DIL  Patient: Aaron [name] Note: All result statuses are Final unless otherwise noted.  Tests: (1) Upper Endoscopy w/DIL (UED)  UED Upper Endoscopy w/DIL                             DONE     [facility]     [address]     [place], [place]  [address]           ENDOSCOPY PROCEDURE REPORT           PATIENT:  [name], Aaron  MR#:  [id number]     BIRTHDATE:  [birth year], 73 yrs. old  GENDER:  male           ENDOSCOPIST:  Judie Petit T. Russella Dar, MD, [facility]           PROCEDURE DATE:  04/07/2009     PROCEDURE:  EGD with foreign body removal, EGD with biopsy     ASA CLASS:  Class II     INDICATIONS:  1) dysphagia  2) GERD  3) weight loss           MEDICATIONS:  Fentanyl 75 mcg IV, Versed 9 mg IV     TOPICAL ANESTHETIC:  Exactacain Spray           DESCRIPTION OF PROCEDURE:   After the risks benefits and     alternatives of the procedure were thoroughly explained, informed     consent was obtained.  The LB-GIF-H180 [id number] endoscope was     introduced through the mouth and advanced to the second portion of     the duodenum, without limitations.  The instrument was slowly     withdrawn as the mucosa was carefully examined.     <<PROCEDUREIMAGES>>           Retained food was present in the proximal esophagus. The retained     food was removed by suctioning and gently advancing the food into     the stomach.  A mass was found in the proximal esophagus. It was     malignant appearing, friable and partially obstructing. It was 10     cm in length from 20-30cm. Multiple biopsies were obtained and     sent to pathology.  A stricture was found in the distal esophagus     which was benign appearing.  The stomach was entered and closely     examined. The pylorus, antrum, angularis, and lesser curvature     were well visualized, including a retroflexed view of the cardia     and fundus. The stomach wall was normally distensable. The scope     passed easily  through the pylorus into the duodenum.  The duodenal     bulb was normal in appearance, as was the postbulbar duodenum.           COMPLICATIONS:  None           ENDOSCOPIC IMPRESSION:     1) Food, retained in the proximal esophagus     2) Mass in the proximal esophagus     3) Stricture in the distal esophagus           RECOMMENDATIONS:     1) await pathology results     2) Chest and abd CT scan     3) CBC, CMET, PT/PTT today           Anastyn Ayars T. Russella Dar,  MD, [facility]           CC:  Linda Hedges. Plotnikov, M.D.           n.     eSIGNED:   Venita Lick. Renso Swett at 04/07/2009 09:19 AM           [name], [id number]  Note: An exclamation mark (!) indicates a result that was not dispersed into the flowsheet. Document Creation Date: 04/07/2009 9:20 AM _______________________________________________________________________  (1) Order result status: Final Collection or observation date-time: 04/07/2009 09:09 Requested date-time:  Receipt date-time:  Reported date-time:  Referring Physician:   Ordering Physician: Claudette Head [phone]) Specimen Source:  Source: Launa Grill Order Number: [id number] Lab site:   Appended Document: Orders Update    Clinical Lists Changes  Orders: Added new Referral order of CT Chest/Abdomen w IV and Oral Contrast (CT CH/ABD w IV/Oral ) - Signed Added new Test order of TLB-CBC Platelet - w/Differential (85025-CBCD) - Signed Added new Test order of TLB-CMP (Comprehensive Metabolic Pnl) (80053-COMP) - Signed Added new Test order of TLB-PTT (85730-PTTL) - Signed Added new Test order of TLB-PT (Protime) (85610-PTP) - Signed

## 2010-04-25 NOTE — Progress Notes (Signed)
Summary: Schedule repeat EGD  Phone Note Outgoing Call   Summary of Call: I called pt. about 1120. His wife anwered the phone, the pt. was not home, so I gave the pathology and CT report to her. I explained that this was almost certainly an esophageal squamous cell carcinoma. She will notify him later today. Schedule repeat EGD with biopsies to hopefully establish the diagnosis with certainty. Oncology referral next. EUS may also be needed. Will discuss. Initial call taken by: Meryl Dare MD FACG,  April 11, 2009 11:25 AM  Follow-up for Phone Call        Patient  has been rescheduled for direct EGD with bx 04-13-09 11:30.  Appt was scheduled with the patient's wife.  I have faxed referral info to [facility] to start referral. Follow-up by: Darcey Nora RN, CGRN,  April 11, 2009 1:15 PM     Appended Document: Schedule repeat EGD    Clinical Lists Changes  Orders: Added new Test order of [facility]) - Signed

## 2010-04-25 NOTE — Progress Notes (Signed)
Summary: GI Risk manager  GI Office Visit/[facility]   Imported By: Sherian Rein 04/08/2009 08:53:09  _____________________________________________________________________  External Attachment:    Type:   Image     Comment:   External Document

## 2010-04-25 NOTE — Progress Notes (Signed)
Summary: PT feels blockage in throat  Phone Note Call from Patient   Caller: Aaron [name] @ Dr Kalman Drape office [phone] Call For: Dr Russella Dar Summary of Call: Pt feels like there is a blockage in his throat and wanted to schedule him for Endo as soon as possible.  Initial call taken by: Leanor Kail [facility],  Aug 04, 2009 11:21 AM  Follow-up for Phone Call        Dr Russella Dar is this ok to schedule a direct EGD or do you want to see him in the office first?   Follow-up by: Darcey Nora RN, CGRN,  Aug 04, 2009 11:32 AM  Additional Follow-up for Phone Call Additional follow up Details #1::        Direct EGD at [facility] or [facility]. Additional Follow-up by: Meryl Dare MD [facility],  Aug 04, 2009 11:58 AM    Additional Follow-up for Phone Call Additional follow up Details #2::    Left message for patient to call back Darcey Nora RN, [facility]  Aug 04, 2009 12:04 PM  Patient  is scheduled for EGD 08-05-09 11:00 [facility].  I have given verbal instructions to the patient's wife.  He will hold his tube feeds after midnight. Follow-up by: Darcey Nora RN, CGRN,  Aug 04, 2009 1:17 PM

## 2010-04-25 NOTE — Procedures (Signed)
Summary: Upper Endoscopy  Patient: Aaron [name] Note: All result statuses are Final unless otherwise noted.  Tests: (1) Upper Endoscopy (EGD)   EGD Upper Endoscopy       DONE     [facility]     [address]     [place], [place]  [address]           ENDOSCOPY PROCEDURE REPORT           PATIENT:  Aaron, [name]  MR#:  [id number]     BIRTHDATE:  [birth year], 73 yrs. old  GENDER:  male           ENDOSCOPIST:  Judie Petit T. Russella Dar, MD, [facility]     Referred by:           PROCEDURE DATE:  04/13/2009     PROCEDURE:  Esophagoscopy with biopsy     ASA CLASS:  Class II     INDICATIONS:  dysphagia, abnormal imaging-CT scan, esophgeal     mass-suspect malignancy     however biopsies were not diagnostic.           MEDICATIONS:  Fentanyl 50 mcg IV, Versed 6 mg IV     TOPICAL ANESTHETIC:  Exactacain Spray           DESCRIPTION OF PROCEDURE:   After the risks benefits and     alternatives of the procedure were thoroughly explained, informed     consent was obtained.  The [facility] [id number]-H180 [id number] endoscope was     introduced through the mouth and advanced to the stomach cardia,     limited by food. The instrument was slowly withdrawn as the mucosa     was fully examined.     <<PROCEDUREIMAGES>>           Retained food was present in the proximal esophagus. It was gently     advanced into the stomach. A mass was found in the proximal     esophagus. It was malignant appearing, very friable, soft and     partially obstructing. It extended from 20-30cm from the incisors.     Multiple biopsies were obtained and sent to pathology.  Exudate in     the distal esophagus. It was white. R/O candida. Multiple biopsies     were obtained and sent to pathology.  A stricture was found in the     distal esophagus. It was benign appearing.  Pseudodiviticulosis in     the distal esophagus.  Retroflexed views revealed not done. The     scope was then withdrawn from the patient and the procedure  completed.           COMPLICATIONS:  None           ENDOSCOPIC IMPRESSION:     1) Food, retained in the proximal esophagus     2) Mass in the proximal esophagus     3) Exudate in the distal esophagus     4) Stricture in the distal esophagus     5) Esophageal intramural pseudodiverticulosis           RECOMMENDATIONS:     1) await pathology results     2) full liquid diet     3) continue PPI           Bandon Sherwin T. Russella Dar, MD, [facility]           CC:  Zackery Barefoot, MD     Rolm Baptise, MD  n.     eSIGNED:   Jyles Sontag T. Taelar Gronewold at 04/13/2009 12:03 PM           [name], [id number]  Note: An exclamation mark (!) indicates a result that was not dispersed into the flowsheet. Document Creation Date: 04/13/2009 12:06 PM _______________________________________________________________________  (1) Order result status: Final Collection or observation date-time: 04/13/2009 11:52 Requested date-time:  Receipt date-time:  Reported date-time:  Referring Physician:   Ordering Physician: Claudette Head [phone]) Specimen Source:  Source: Launa Grill Order Number: [id number] Lab site:

## 2010-04-25 NOTE — Letter (Signed)
Summary: EGD Instructions  [facility]  [address] [place], [place] [address]   Phone: [phone]  Fax: [phone]       Aaron [name]    Mar 30, 1935    MRN: [id number]       Procedure Day Dorna Bloom: Wednesday 08-24-09     Arrival Time: 10:30 am     Procedure Time: 11:30 am     Location of Procedure:                    _X  _ [facility] (4th Floor)  PREPARATION FOR ENDOSCOPY   On 08-24-09  THE DAY OF THE PROCEDURE:  1.   No solid foods, milk or milk products are allowed after midnight the night before your procedure.  2.   Do not drink anything colored red or purple.  Avoid juices with pulp.  No orange juice.  3.  You may drink clear liquids until 9:30 am, which is 2 hours before your procedure.                                                                                                CLEAR LIQUIDS INCLUDE: Water Jello Ice Popsicles Tea (sugar ok, no milk/cream) Powdered fruit flavored drinks Coffee (sugar ok, no milk/cream) Gatorade Juice: apple, white grape, white cranberry  Lemonade Clear bullion, consomm, broth Carbonated beverages (any kind) Strained chicken noodle soup Hard Candy   MEDICATION INSTRUCTIONS  Unless otherwise instructed, you should take regular prescription medications with a small sip of water as early as possible the morning of your procedure.  Diabetic patients - see separate instructions.           OTHER INSTRUCTIONS  You will need a responsible adult at least 73 years of age to accompany you and drive you home.   This person must remain in the waiting room during your procedure.  Wear loose fitting clothing that is easily removed.  Leave jewelry and other valuables at home.  However, you may wish to bring a book to read or an iPod/MP3 player to listen to music as you wait for your procedure to start.  Remove all body piercing jewelry and leave at home.  Total time from sign-in until discharge is  approximately 2-3 hours.  You should go home directly after your procedure and rest.  You can resume normal activities the day after your procedure.  The day of your procedure you should not:   Drive   Make legal decisions   Operate machinery   Drink alcohol   Return to work  You will receive specific instructions about eating, activities and medications before you leave.    The above instructions have been reviewed and explained to me by   Darcey Nora, RN     I fully understand and can verbalize these instructions mailed instructions to the patient' s home Date 08/05/09     Appended Document: EGD Instructions mailed to patient's home

## 2010-04-25 NOTE — Letter (Signed)
[facility]   Imported By: Lennie Odor 07/12/2009 16:22:04  _____________________________________________________________________  External Attachment:    Type:   Image     Comment:   External Document

## 2010-04-25 NOTE — Assessment & Plan Note (Signed)
Summary: PER FLAG/SD--PT SEEN IN ER/TREMBLES-PHONE/WIFE-STC   Vital Signs:  Patient profile:   75 year old male Height:      72 inches Weight:      165 pounds BMI:     22.46 O2 Sat:      97 % on Room air Temp:     98.1 degrees F oral Pulse rate:   93 / minute BP sitting:   100 / 64  (left arm) Cuff size:   regular  Vitals Entered By: Bill Salinas CMA (Aug 17, 2009 3:14 PM)  O2 Flow:  Room air CC: pt here for hosp f/u, pt states he no longer takes fluticasone, carafate or hydrocodone/ab   Primary Care Provider:  Sula Soda, MD   CC:  pt here for hosp f/u, pt states he no longer takes fluticasone, and carafate or hydrocodone/ab.  History of Present Illness: C/o shakes and jerks in the upper body episode on Sun - he went to ER. F/u GERD and esophageal cancer w/esophagitis; wt loss  Current Medications (verified): 1)  Omeprazole 20 Mg Cpdr (Omeprazole) .Marland Kitchen.. 1 Once Daily 2)  Vitamin D3 1000 Unit  Tabs (Cholecalciferol) .Marland Kitchen.. 1 Qd 3)  Adult Aspirin Low Strength 81 Mg  Tbdp (Aspirin) .... Once Daily 4)  Loratadine 10 Mg  Tabs (Loratadine) .... Once Daily As Needed Allergies 5)  Fluticasone Propionate 50 Mcg/act  Susp (Fluticasone Propionate) .... 2 Sprays Each Nostril Once Daily 6)  Vitamin D 1000 Unit Tabs (Cholecalciferol) .... Once Daily 7)  Senna 187 Mg Tabs (Senna) .... As Needed 8)  Colace 100 Mg Caps (Docusate Sodium) .... As Needed 9)  Prochlorperazine Maleate 10 Mg Tabs (Prochlorperazine Maleate) .... As Needed For Nausea 10)  Zolpidem Tartrate 5 Mg Tabs (Zolpidem Tartrate) .... At Bedtime 11)  Oxycodone Hcl 5 Mg Tabs (Oxycodone Hcl) .Marland Kitchen.. 1-2 Per Tube Bid As Needed 12)  Carafate 1 Gm/32ml Susp (Sucralfate) .... Once Daily 13)  Hydrocodone-Acetaminophen 7.5-500 Mg Tabs (Hydrocodone-Acetaminophen) .... As Needed 14)  Simvastatin 20 Mg Tabs (Simvastatin) .Marland Kitchen.. 1 By Mouth Once Daily For Cholesterol 15)  Omeprazole 40 Mg  Cpdr (Omeprazole) .Marland Kitchen.. 1 Each Day 30 Minutes Before  Meal 16)  Lorazepam 1 Mg Tabs (Lorazepam) .Marland Kitchen.. 1 Every 4 To 6 Hours  Allergies (verified): 1)  ! Amoxicillin 2)  Ceftin 3)  Diflucan (Fluconazole)  Past History:  Past Medical History: Last updated: 06/13/2009 Diverticulosis, colon Gastritis Hemorrhoids Hiatal hernia GERD Hyperlipidemia  Dr Tresa Endo Hypertension Elevated glucose/glucose intolerance Tobacco-quit 2002 Allergic rhinitis Esophageal Ca 2011  Past Surgical History: Last updated: 06/13/2009 lower back cyst removed per [place] [facility]  Stoma 2011  Family History: Last updated: 04/06/2009 No History Hypertension No CAD m-grandmother with stroke at 35yo father with prostate cancer in his 18's brother with prostate cancer at 56yo brother with prostate cancer at 63yo No FH of Colon Cancer: Family History of Diabetes: Father   Social History: Last updated: 04/06/2009 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  The patient denies chest pain, dyspnea on exertion, abdominal pain, and melena.    Physical Exam  General:  alert and thin  Nose:  WNL  Mouth:  Oral mucosa and oropharynx without lesions or exudates.  Teeth in good repair. Neck:  No deformities, masses, or  tenderness noted. Lungs:  Normal respiratory effort, chest expands symmetrically. Lungs are clear to auscultation, no crackles or wheezes. Heart:  Normal rate and regular rhythm. S1 and S2 normal without gallop, murmur, click, rub or other extra sounds. Abdomen:  Bowel sounds positive,abdomen soft and non-tender without masses, organomegaly or hernias noted. Msk:  No deformity or scoliosis noted of thoracic or lumbar spine.   Neurologic:  No cranial nerve deficits noted. Station and gait are normal. Plantar reflexes are down-going bilaterally. DTRs  are symmetrical throughout. Sensory, motor and coordinative functions appear intact. Skin:  mild rash to lower legs only - o/w no rash to head or torso Psych:  Cognition and judgment appear intact. Alert and cooperative with normal attention span and concentration. No apparent delusions, illusions, hallucinations   Impression & Recommendations:  Problem # 1:  INVOLUNTARY MOVEMENTS, ABNORMAL (ICD-781.0) Assessment New Poss due to Diflucan, it is stopped. Will use Nystatin if needed. I spoke w/Dr Estell Harpin.  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 1 once daily    Carafate 1 Gm/86ml Susp (Sucralfate) ..... Once daily    Omeprazole 40 Mg Cpdr (Omeprazole) .Marland Kitchen... 1 each day 30 minutes before meal  Complete Medication List: 1)  Omeprazole 20 Mg Cpdr (Omeprazole) .Marland Kitchen.. 1 once daily 2)  Vitamin D3 1000 Unit Tabs (Cholecalciferol) .Marland Kitchen.. 1 qd 3)  Adult Aspirin Low Strength 81 Mg Tbdp (Aspirin) .... Once daily 4)  Loratadine 10 Mg Tabs (Loratadine) .... Once daily as needed allergies 5)  Fluticasone Propionate 50 Mcg/act Susp (Fluticasone propionate) .... 2 sprays each nostril once daily 6)  Vitamin D 1000 Unit Tabs (Cholecalciferol) .... Once daily 7)  Senna 187 Mg Tabs (Senna) .... As needed 8)  Colace 100 Mg Caps (Docusate sodium) .... As needed 9)  Prochlorperazine Maleate 10 Mg Tabs (Prochlorperazine maleate) .... As needed for nausea 10)  Zolpidem Tartrate 5 Mg Tabs (Zolpidem tartrate) .... At bedtime 11)  Oxycodone Hcl 5 Mg Tabs (Oxycodone hcl) .Marland Kitchen.. 1-2 per tube bid as needed 12)  Carafate 1 Gm/24ml Susp (Sucralfate) .... Once daily 13)  Hydrocodone-acetaminophen 7.5-500 Mg Tabs (Hydrocodone-acetaminophen)  .... As needed 14)  Simvastatin 20 Mg Tabs (Simvastatin) .Marland Kitchen.. 1 by mouth once daily for cholesterol 15)  Omeprazole 40 Mg Cpdr (Omeprazole) .Marland Kitchen.. 1 each day 30 minutes before meal 16)  Lorazepam 1 Mg Tabs (Lorazepam) .Marland Kitchen.. 1 every 4 to 6 hours as needed anxiety or shaking  Patient Instructions: 1)  Please schedule a follow-up appointment in 2 months. 2)  BMP prior to visit, ICD-9: 3)  Hepatic Panel prior to visit, ICD-9: 4)  TSH prior to visit, ICD-9: 5)  CBC w/ Diff prior to visit, ICD-9: 6)  HbgA1C prior to visit, ICD-9: 7)  Vit D 8)  Vit B12  995.20  401.1

## 2010-04-25 NOTE — Procedures (Signed)
Summary: EGD   EGD  Procedure date:  10/02/2001  Findings:      Findings: Duodenitis  Findings: Esophagitis  Findings: Gastritis  Location: [facility]    EGD  Procedure date:  10/02/2001  Findings:      Findings: Duodenitis  Findings: Esophagitis  Findings: Gastritis  Location: [facility]   Patient Name: Aaron [name], Aaron [name] MRN:  Procedure Procedures: Panendoscopy (EGD) CPT: 43235.    with biopsy(s)/brushing(s). CPT: D1846139.  Personnel: Endoscopist: Ulyess Mort, MD.  Referred By: Linda Hedges Plotnikov, MD.  Exam Location: Exam performed in Outpatient Clinic. Outpatient  Patient Consent: Procedure, Alternatives, Risks and Benefits discussed, consent obtained, from patient. Consent was obtained by the RN.  Indications Symptoms: Dysphagia. Reflux symptoms  History  Pre-Exam Physical: Performed Oct 02, 2001  Abdominal exam, Extremity exam, Mental status exam WNL.  Exam Exam Info: Maximum depth of insertion Duodenum, intended Duodenum. Vocal cords visualized. Gastric retroflexion performed. Images were not taken. ASA Classification: II. Tolerance: good.  Sedation Meds: Patient assessed and found to be appropriate for moderate (conscious) sedation. Fentanyl 50 mcg. given IV. Versed 6 mg. given IV. Cetacaine Spray 1 sprays given aerosolized.  Monitoring: BP and pulse monitoring done. Oximetry used. Supplemental O2 given  Findings - HIATAL HERNIA: 4 cms. in length. Biopsy/Hiatal Hernia taken.  ICD9: Esophagitis: 530.10.Comments: ??? EOSINOPHILIC ESOPHAGITIS.  - MUCOSAL ABNORMALITY: Fundus to Antrum. Erosions present. Erythematous mucosa. Granular mucosa. RUT done, results pending. ICD9: Gastritis, Chronic: 535.10.  - MUCOSAL ABNORMALITY: Duodenal Bulb to Jejunum. Granular mucosa. ICD9: Duodenitis without Hemorrhage: 535.60.   Assessment Abnormal examination, see findings above.  Diagnoses: 530.10: Esophagitis.  535.10: Gastritis,  Chronic.  535.60: Duodenitis without Hemorrhage.   Events  Unplanned Intervention: No unplanned interventions were required.  Unplanned Events: There were no complications. Plans Medication(s): Await pathology. Continue current medications.  Patient Education: Patient given standard instructions for: Hiatal Hernia. Reflux. Mucosal Abnormality. NEEDS SAVARY DILATION.  Disposition: After procedure patient sent to recovery. After recovery patient sent home.    This report was created from the original endoscopy report, which was reviewed and signed by the above listed endoscopist.    cc: Sonda Primes, MD

## 2010-04-25 NOTE — Letter (Signed)
[facility]   Imported By: Lennie Odor 09/22/2009 14:27:03  _____________________________________________________________________  External Attachment:    Type:   Image     Comment:   External Document

## 2010-04-25 NOTE — Assessment & Plan Note (Signed)
Summary: post hosp/[facility]es [facility]/2 wk f/u per pt/cd   Vital Signs:  Patient profile:   75 year old male Weight:      164 pounds Temp:     99.1 degrees F oral Pulse rate:   114 / minute BP sitting:   92 / 70  (left arm)  Vitals Entered By: Tora Perches (June 13, 2009 10:23 AM) CC: post hosp Is Patient Diabetic? No   Primary Care Provider:  Sula Soda, MD   CC:  post hosp.  History of Present Illness: The patient presents for a post-hospital visit for:  1. Culture negative neutropenic fever, now resolved.   2. Hyponatremia, improving.   3. Chronic anemia.   4. Esophageal cancer with the patient undergoing both chemotherapy and       radiation therapy.   5. Generalized weakness, improved.   6. Dysphagia and odynophagia.  The patient has a feeding tube in       place.   7. Dehydration, resolved.   8. Mucositis secondary to radiation therapy.   9. Malnutrition, the patient is on tube feeds.   Preventive Screening-Counseling & Management  Alcohol-Tobacco     Smoking Status: quit  Current Medications (verified): 1)  Simvastatin 40 Mg  Tabs (Simvastatin) .Marland Kitchen.. 1 Once Daily 2)  Omeprazole 20 Mg Cpdr (Omeprazole) .Marland Kitchen.. 1 Once Daily 3)  Vitamin D3 1000 Unit  Tabs (Cholecalciferol) .Marland Kitchen.. 1 Qd 4)  Adult Aspirin Low Strength 81 Mg  Tbdp (Aspirin) .... Once Daily 5)  Loratadine 10 Mg  Tabs (Loratadine) .... Once Daily As Needed Allergies 6)  Fluticasone Propionate 50 Mcg/act  Susp (Fluticasone Propionate) .... 2 Sprays Each Nostril Once Daily 7)  Vitamin D 1000 Unit Tabs (Cholecalciferol) .... Once Daily 8)  Senna 187 Mg Tabs (Senna) .... As Needed 9)  Colace 100 Mg Caps (Docusate Sodium) .... As Needed 10)  Prochlorperazine Maleate 10 Mg Tabs (Prochlorperazine Maleate) .... As Needed For Nausea 11)  Zolpidem Tartrate 5 Mg Tabs (Zolpidem Tartrate) .... At Bedtime 12)  Oxycodone Hcl 5 Mg Tabs (Oxycodone Hcl) .... As Needed 13)  Carafate 1 Gm/30ml Susp (Sucralfate) .... Once  Daily 14)  Hydrocodone-Acetaminophen 7.5-500 Mg Tabs (Hydrocodone-Acetaminophen) .... As Needed  Allergies: 1)  ! 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  The patient denies fever, abdominal pain, and melena.     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 He is due labwork later today w/a f/u appt w/Dr Truett Perna tomorrow.  Problem # 5:  FEVER UNSPECIFIED (ICD-780.60) - netropenic Assessment: Improved Hospital records reviewed The  office visit took longer than 45 min with patient councelling for more than 50% of the 45 min   Complete Medication List: 1)  Omeprazole 20 Mg Cpdr (Omeprazole) .Marland Kitchen.. 1 once daily 2)  Vitamin D3 1000 Unit Tabs (Cholecalciferol) .Marland Kitchen.. 1 qd 3)  Adult Aspirin Low Strength 81 Mg Tbdp (Aspirin) .... Once daily 4)  Loratadine 10 Mg Tabs (Loratadine) .... Once daily as needed allergies 5)  Fluticasone Propionate 50 Mcg/act Susp (Fluticasone propionate) .... 2 sprays each  nostril once daily 6)  Vitamin D 1000 Unit Tabs (Cholecalciferol) .... Once daily 7)  Senna 187 Mg Tabs (Senna) .... As needed 8)  Colace 100 Mg Caps (Docusate sodium) .... As needed 9)  Prochlorperazine Maleate 10 Mg Tabs (Prochlorperazine maleate) .... As needed for nausea 10)  Zolpidem Tartrate 5 Mg Tabs (Zolpidem tartrate) .... At bedtime 11)  Oxycodone Hcl 5 Mg Tabs (Oxycodone hcl) .Marland Kitchen.. 1-2 per tube bid as needed 12)  Carafate 1 Gm/79ml Susp (Sucralfate) .... Once daily 13)  Hydrocodone-acetaminophen 7.5-500 Mg Tabs (Hydrocodone-acetaminophen) .... As needed 14)  Simvastatin 20 Mg Tabs (Simvastatin) .Marland Kitchen.. 1 by mouth once daily for cholesterol  Patient Instructions: 1)  Please schedule a follow-up appointment in 2 months. 2)  Keep your appointment w/ Dr Truett Perna tomorrow. Get Labs drawn later  today. 3)  Call if you are not better in a reasonable amount of time or if worse. Go to ER if feeling really bad!  Prescriptions: OXYCODONE HCL 5 MG TABS (OXYCODONE HCL) 1-2 per tube bid as needed  #120 x 0   Entered and Authorized by:   Tresa Garter MD   Signed by:   Tresa Garter MD on 06/13/2009   Method used:   Print then Give to Patient   RxID:   [id number] ZOLPIDEM TARTRATE 5 MG TABS (ZOLPIDEM TARTRATE) at bedtime  #30 x 6   Entered and Authorized by:   Tresa Garter MD   Signed by:   Tresa Garter MD on 06/13/2009   Method used:   Print then Give to Patient   RxID:   [id number] SIMVASTATIN 20 MG TABS (SIMVASTATIN) 1 by mouth once daily for cholesterol  #30 x 12   Entered and Authorized by:   Tresa Garter MD   Signed by:   Tresa Garter MD on 06/13/2009   Method used:   Print then Give to Patient   RxID:   [id number]

## 2010-04-25 NOTE — Letter (Signed)
Summary: [facility]   Imported By: Sherian Rein 06/01/2009 13:11:56  _____________________________________________________________________  External Attachment:    Type:   Image     Comment:   External Document

## 2010-04-25 NOTE — Letter (Signed)
[facility]   Imported By: Sherian Rein 07/22/2009 14:27:25  _____________________________________________________________________  External Attachment:    Type:   Image     Comment:   External Document

## 2010-04-25 NOTE — Progress Notes (Signed)
----   Converted from flag ---- ---- 08/15/2009 8:58 AM, Lamar Sprinkles, CMA wrote: Per Dr Macario Golds, pt needs office visit w/him this wednesday. Please help THANK YOU ------------------------------  Gave pt appt:  08/17/09 @ 3p w/Dr Plotnikov--phone/wife

## 2010-04-25 NOTE — Assessment & Plan Note (Signed)
Summary: 4 MO ROV /NWS  #   Vital Signs:  Patient profile:   75 year old male Height:      72 inches Weight:      197 pounds BMI:     26.81 Temp:     98.3 degrees F oral Pulse rate:   64 / minute Pulse rhythm:   regular Resp:     16 per minute BP sitting:   102 / 50  (left arm) Cuff size:   regular  Vitals Entered By: Lanier Prude, CMA(AAMA) (February 24, 2010 1:41 PM) CC: 4 mo f/u c/o lingering cough Is Patient Diabetic? No   Primary Care Provider:  Sula Soda, MD   CC:  4 mo f/u c/o lingering cough.  History of Present Illness: The patient presents for a follow up of  HTN, elev glu and esoph ca C/o chronic cough  Current Medications (verified): 1)  Omeprazole 20 Mg Cpdr (Omeprazole) .Marland Kitchen.. 1 Once Daily 2)  Vitamin D3 1000 Unit  Tabs (Cholecalciferol) .Marland Kitchen.. 1 Qd 3)  Adult Aspirin Low Strength 81 Mg  Tbdp (Aspirin) .... Once Daily 4)  Loratadine 10 Mg  Tabs (Loratadine) .... Once Daily As Needed Allergies 5)  Fluticasone Propionate 50 Mcg/act  Susp (Fluticasone Propionate) .... 2 Sprays Each Nostril Once Daily 6)  Vitamin D 1000 Unit Tabs (Cholecalciferol) .... Once Daily 7)  Senna 187 Mg Tabs (Senna) .... As Needed 8)  Colace 100 Mg Caps (Docusate Sodium) .... As Needed 9)  Prochlorperazine Maleate 10 Mg Tabs (Prochlorperazine Maleate) .... As Needed For Nausea 10)  Zolpidem Tartrate 5 Mg Tabs (Zolpidem Tartrate) .... At Bedtime 11)  Oxycodone Hcl 5 Mg Tabs (Oxycodone Hcl) .Marland Kitchen.. 1-2 Per Tube Bid As Needed 12)  Carafate 1 Gm/78ml Susp (Sucralfate) .... Once Daily 13)  Hydrocodone-Acetaminophen 7.5-500 Mg Tabs (Hydrocodone-Acetaminophen) .Marland Kitchen.. 1 Po Bid As Needed 14)  Simvastatin 20 Mg Tabs (Simvastatin) .Marland Kitchen.. 1 By Mouth Once Daily For Cholesterol 15)  Omeprazole 40 Mg  Cpdr (Omeprazole) .Marland Kitchen.. 1 Each Day 30 Minutes Before Meal 16)  Lorazepam 1 Mg Tabs (Lorazepam) .Marland Kitchen.. 1 Every 4 To 6 Hours As Needed Anxiety or Shaking 17)  Mupirocin 2 % Oint (Mupirocin) .... Use Two Times A  Day W/dressing Change  Allergies (verified): 1)  ! 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  The patient denies fever, weight loss, syncope, and abdominal pain.    Physical Exam  General:  alert and well-nourished.   Nose:  External nasal examination shows no deformity or inflammation. Nasal mucosa are pink and moist without lesions or exudates. Mouth:  Oral mucosa and oropharynx without lesions or exudates.  Teeth in good repair. Neck:  No deformities, masses, or tenderness noted. Lungs:  Normal respiratory effort, chest expands symmetrically. Lungs are clear to auscultation, no crackles or wheezes. Heart:  Normal rate and regular rhythm. S1 and S2 normal without gallop, murmur, click, rub or other extra sounds. Abdomen:  Bowel sounds positive,abdomen soft and non-tender without masses, organomegaly or  hernias noted. Msk:  No deformity or scoliosis noted of thoracic or lumbar spine.   Neurologic:  No cranial nerve deficits noted. Station and gait are normal. Plantar reflexes are down-going bilaterally. DTRs are symmetrical throughout. Sensory, motor and coordinative functions appear intact. Skin:  1.5x1.0 cm infected cyst at the R neck base Cervical Nodes:  No lymphadenopathy noted Psych:  Cognition and judgment appear intact. Alert and cooperative with normal attention span and concentration. 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 1inh bid  Complete Medication List: 1)  Omeprazole 20 Mg Cpdr (Omeprazole) .Marland Kitchen.. 1 once daily 2)  Vitamin D3 1000 Unit Tabs (Cholecalciferol) .Marland Kitchen.. 1 qd 3)  Adult Aspirin Low Strength 81 Mg Tbdp (Aspirin) .... Once daily 4)  Loratadine 10 Mg Tabs (Loratadine) .... Once daily as needed allergies 5)  Fluticasone Propionate 50 Mcg/act Susp (Fluticasone propionate) .... 2 sprays each nostril once daily 6)  Vitamin D 1000 Unit Tabs (Cholecalciferol) .... Once daily 7)  Senna 187 Mg Tabs (Senna) .... As needed 8)  Colace 100 Mg Caps (Docusate sodium) .... As needed 9)  Prochlorperazine Maleate 10 Mg Tabs (Prochlorperazine maleate) .... As needed for nausea 10)  Zolpidem Tartrate 5 Mg Tabs (Zolpidem tartrate) .... At bedtime 11)  Oxycodone Hcl 5 Mg Tabs (Oxycodone hcl) .Marland Kitchen.. 1-2 per tube bid as needed 12)  Carafate 1 Gm/23ml Susp (Sucralfate) .... Once daily 13)  Hydrocodone-acetaminophen 7.5-500 Mg Tabs (Hydrocodone-acetaminophen) .Marland Kitchen.. 1 po bid as needed 14)  Simvastatin 20 Mg Tabs (Simvastatin) .Marland Kitchen.. 1 by mouth once daily for cholesterol 15)  Omeprazole 40 Mg Cpdr (Omeprazole) .Marland Kitchen.. 1 each day 30 minutes before meal 16)  Lorazepam 1 Mg Tabs (Lorazepam) .Marland Kitchen.. 1 every 4 to 6 hours as needed anxiety or shaking 17)  Mupirocin 2 % Oint (Mupirocin) .... Use two times a day w/dressing change 18)  Dulera 200-5 Mcg/act Aero (Mometasone furo-formoterol fum) .Marland Kitchen.. 1inh bid  Patient Instructions: 1)  Please schedule a follow-up  appointment in 3 months well w/labs. 2)  BMP prior to visit, ICD-9: 3)  Hepatic Panel prior to visit, ICD-9:790.29  99520  401.1 4)  CBC w/ Diff prior to visit, ICD-9: 5)  HbgA1C prior to visit, ICD-9: 6)  TSH prior to visit, ICD-9: 7)  Lipid Panel prior to visit, ICD-9: 8)  Urine-dip prior to visit, ICD-9: 9)  PSA prior to visit, ICD-9: Prescriptions: DULERA 200-5 MCG/ACT AERO (MOMETASONE FURO-FORMOTEROL FUM) 1inh bid  #1 x 3   Entered and Authorized by:   Tresa Garter MD   Signed by:   Tresa Garter MD on 02/24/2010   Method used:   Print then Give to Patient   RxID:   [id number]    Orders Added: 1)  Est. Patient Level IV [56213]   Immunization History:  Influenza Immunization History:    Influenza:  historical (01/04/2010)   Immunization History:  Influenza Immunization History:    Influenza:  Historical (01/04/2010)

## 2010-04-25 NOTE — Letter (Signed)
Summary: [facility]   Imported By: Lester Richardton 06/22/2009 10:48:39  _____________________________________________________________________  External Attachment:    Type:   Image     Comment:   External Document

## 2010-04-25 NOTE — Letter (Signed)
[facility]   Imported By: Sherian Rein 10/20/2009 08:23:07  _____________________________________________________________________  External Attachment:    Type:   Image     Comment:   External Document

## 2010-04-25 NOTE — Progress Notes (Signed)
Summary: sooner appt  Phone Note Call from Patient Call back at Home Phone [phone]   Caller: Patient Call For: Russella Dar Reason for Call: Talk to Nurse Summary of Call: Patient wants to be seen before first available appt 8-8 because he states that Dr Russella Dar wanted to see him 2 weeks after his procedure Initial call taken by: Tawni Levy,  September 14, 2009 1:58 PM  Follow-up for Phone Call        Patient  reports he has no dysphagia at this time.  I reviewed the EGD report with his to follow up with [place] as needed.  He will call back if he needs repeat dilation. Follow-up by: Darcey Nora RN, CGRN,  September 14, 2009 2:11 PM

## 2010-04-25 NOTE — Letter (Signed)
[facility]   Imported By: Sherian Rein 05/23/2009 11:02:36  _____________________________________________________________________  External Attachment:    Type:   Image     Comment:   External Document

## 2010-04-25 NOTE — Progress Notes (Signed)
Summary: appt request.  Phone Note Call from Patient   Caller: Patient Summary of Call: pt stated that since he was diagnosed with cancer in January, June things are fine. But last week found a cyst on outside of throat. It came to a head and busted. Can feel a needle sensation down neck. Wants to see about getting an appt to discuss with Dr.  Initial call taken by: Alysia Penna,  December 19, 2009 3:57 PM  Follow-up for Phone Call        Pt scheduled for office visit tomorrow Follow-up by: Lamar Sprinkles, CMA,  December 19, 2009 4:51 PM

## 2010-04-25 NOTE — Letter (Signed)
Summary: Patient [facility] Biopsy Results  [facility]  [address] [place], [place] [address]   Phone: [phone]  Fax: [phone]        Aug 10, 2009 MRN: [id number]    Aaron [name] [address] [place], [place]  [address]    Dear Aaron [name],  I am pleased to inform you that the biopsies taken during your recent endoscopic examination did not show any evidence of cancer upon pathologic examination. The biopsy showed ulceration and candida.  Continue with the treatment plan as outlined on the day of your      exam.  Please call us if you are having persistent problems or have questions about your condition that have not been fully answered at this time.  Sincerely,  Meryl Dare MD [facility]  This letter has been electronically signed by your physician.  Appended Document: Patient Notice-Endo Biopsy Results letter mailed

## 2010-04-25 NOTE — Letter (Signed)
[facility]   Imported By: Sherian Rein 08/04/2009 13:24:03  _____________________________________________________________________  External Attachment:    Type:   Image     Comment:   External Document

## 2010-04-25 NOTE — Letter (Signed)
[facility]   Imported By: Sherian Rein 07/28/2009 11:27:15  _____________________________________________________________________  External Attachment:    Type:   Image     Comment:   External Document

## 2010-04-25 NOTE — Assessment & Plan Note (Signed)
Summary: cyst in throat?/SD   Vital Signs:  Patient profile:   75 year old male Height:      72 inches Weight:      193 pounds BMI:     26.27 Temp:     99.3 degrees F oral Pulse rate:   72 / minute Pulse rhythm:   regular Resp:     16 per minute BP sitting:   98 / 70  (left arm) Cuff size:   regular  Vitals Entered By: Lanier Prude, Beverly Gust) (December 20, 2009 3:53 PM)  Procedure Note Last Tetanus: given (08/26/2006)  Incision & Drainage: The patient complains of pain, inflammation, and swelling. Indication: infected lesion Consent signed: yes  Procedure # 1: I & D with packing    Size (in cm): 1.5 x 1.0    Region: medial    Location: R neck base    Comment: Risks including but not limited by incomplete procedure, bleeding, infection, recurrence were discussed with the patient. Consent form was signed. 1 cm incision was made.   1  cc of purulent and cheese likematerial was evacuated. Wound cavity was probed w/blunt forceps and cleaned.  1" of packing was inserted. Silvadine cream and TELFA pad applied under gause dressing.     Instrument used: #10 blade    Anesthesia: 1.5 ml 1% lidocaine w/epinephrine  Cleaned and prepped with: alcohol and betadine Wound dressing: bulky gauze dressing Instructions: daily dressing changes  CC: changing lesion on Right side of neck Is Patient Diabetic? No   Primary Care Provider:  Sula Soda, MD   CC:  changing lesion on Right side of neck.  History of Present Illness: C/o growth on R neck at base x 7 days, pusy d/c and pain  Preventive Screening-Counseling & Management  Alcohol-Tobacco     Smoking Status: quit  Current Medications (verified): 1)  Omeprazole 20 Mg Cpdr (Omeprazole) .Marland Kitchen.. 1 Once Daily 2)  Vitamin D3 1000 Unit  Tabs (Cholecalciferol) .Marland Kitchen.. 1 Qd 3)  Adult Aspirin Low Strength 81 Mg  Tbdp (Aspirin) .... Once Daily 4)  Loratadine 10 Mg  Tabs (Loratadine) .... Once Daily As Needed Allergies 5)  Fluticasone  Propionate 50 Mcg/act  Susp (Fluticasone Propionate) .... 2 Sprays Each Nostril Once Daily 6)  Vitamin D 1000 Unit Tabs (Cholecalciferol) .... Once Daily 7)  Senna 187 Mg Tabs (Senna) .... As Needed 8)  Colace 100 Mg Caps (Docusate Sodium) .... As Needed 9)  Prochlorperazine Maleate 10 Mg Tabs (Prochlorperazine Maleate) .... As Needed For Nausea 10)  Zolpidem Tartrate 5 Mg Tabs (Zolpidem Tartrate) .... At Bedtime 11)  Oxycodone Hcl 5 Mg Tabs (Oxycodone Hcl) .Marland Kitchen.. 1-2 Per Tube Bid As Needed 12)  Carafate 1 Gm/30ml Susp (Sucralfate) .... Once Daily 13)  Hydrocodone-Acetaminophen 7.5-500 Mg Tabs (Hydrocodone-Acetaminophen) .... As Needed 14)  Simvastatin 20 Mg Tabs (Simvastatin) .Marland Kitchen.. 1 By Mouth Once Daily For Cholesterol 15)  Omeprazole 40 Mg  Cpdr (Omeprazole) .Marland Kitchen.. 1 Each Day 30 Minutes Before Meal 16)  Lorazepam 1 Mg Tabs (Lorazepam) .Marland Kitchen.. 1 Every 4 To 6 Hours As Needed Anxiety or Shaking  Allergies (verified): 1)  ! 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    Physical Exam  General:  alert and well-nourished.   Lungs:  Normal respiratory effort, chest expands symmetrically. Lungs are clear to auscultation, no crackles or wheezes. Heart:  Normal rate and regular rhythm. S1 and S2 normal without gallop, murmur, click, rub or other extra sounds. Skin:  1.5x1.0 cm infected cyst at the R neck base Cervical Nodes:  No lymphadenopathy  noted   Impression & Recommendations:  Problem # 1:  SEBACEOUS CYST, INFECTED (ICD-706.2) R neck base Assessment New Dressing change instr. provided Doxy x 10 d Mupirocin w/dressing changes Orders: I&D Abscess, Complex (10061)  Complete Medication List: 1)  Omeprazole 20 Mg Cpdr (Omeprazole) .Marland Kitchen.. 1 once daily 2)  Vitamin D3 1000 Unit Tabs (Cholecalciferol) .Marland Kitchen.. 1 qd 3)  Adult Aspirin Low Strength 81 Mg Tbdp (Aspirin) .... Once daily 4)  Loratadine 10 Mg Tabs (Loratadine) .... Once daily as needed allergies 5)  Fluticasone Propionate 50 Mcg/act Susp (Fluticasone propionate) .... 2 sprays each nostril once daily 6)  Vitamin D 1000 Unit Tabs (Cholecalciferol) .... Once daily 7)  Senna 187 Mg Tabs (Senna) .... As needed 8)  Colace 100 Mg Caps (Docusate sodium) .... As needed 9)  Prochlorperazine Maleate 10 Mg Tabs (Prochlorperazine maleate) .... As needed for nausea 10)  Zolpidem Tartrate 5 Mg Tabs (Zolpidem tartrate) .... At bedtime 11)  Oxycodone Hcl 5 Mg Tabs (Oxycodone hcl) .Marland Kitchen.. 1-2 per tube bid as needed 12)  Carafate 1 Gm/19ml Susp (Sucralfate) .... Once daily 13)  Hydrocodone-acetaminophen 7.5-500 Mg Tabs (Hydrocodone-acetaminophen) .Marland Kitchen.. 1 po bid as needed 14)  Simvastatin 20 Mg Tabs (Simvastatin) .Marland Kitchen.. 1 by mouth once daily for cholesterol 15)  Omeprazole 40 Mg Cpdr (Omeprazole) .Marland Kitchen.. 1 each day 30 minutes before meal 16)  Lorazepam 1 Mg Tabs (Lorazepam) .Marland Kitchen.. 1 every 4 to 6 hours as needed anxiety or shaking 17)  Doxycycline Hyclate 100 Mg Caps (Doxycycline hyclate) .Marland Kitchen.. 1 by mouth two times a day with a glass of water 18)  Mupirocin 2 % Oint (Mupirocin) .... Use two times a day w/dressing change  Patient Instructions: 1)  Call if you are not better in a reasonable amount of time or if worse.  Prescriptions: MUPIROCIN 2 % OINT (MUPIROCIN) use two times a day w/dressing change  #30 g x 0   Entered and Authorized by:   Tresa Garter MD   Signed by:   Tresa Garter MD  on 12/20/2009   Method used:   Print then Give to Patient   RxID:   [id number] DOXYCYCLINE HYCLATE 100 MG CAPS (DOXYCYCLINE HYCLATE) 1 by mouth two times a day with a glass of water  #20 x 0   Entered and Authorized by:   Tresa Garter MD   Signed by:   Tresa Garter MD on 12/20/2009   Method used:   Print then Give to Patient   RxID:   [id number] HYDROCODONE-ACETAMINOPHEN 7.5-500 MG TABS (HYDROCODONE-ACETAMINOPHEN) 1 po bid as needed  #30 x 0   Entered and Authorized by:   Tresa Garter MD   Signed by:   Tresa Garter MD on 12/20/2009   Method used:   Print then Give to Patient   RxID:   [id number]

## 2010-04-25 NOTE — Miscellaneous (Signed)
Summary: rx omperazole  Clinical Lists Changes  Medications: Added new medication of OMEPRAZOLE 40 MG  CPDR (OMEPRAZOLE) 1 each day 30 minutes before meal - Signed Rx of OMEPRAZOLE 40 MG  CPDR (OMEPRAZOLE) 1 each day 30 minutes before meal;  #30 x 11;  Signed;  Entered by: Sherren Kerns RN;  Authorized by: Meryl Dare MD [facility];  Method used: Electronically to [facility] #[id number]*, [address] [place], [place]  [address], Ph: [phone], Fax: [phone] Observations: Added new observation of ALLERGY REV: Done (08/05/2009 12:55)    Prescriptions: OMEPRAZOLE 40 MG  CPDR (OMEPRAZOLE) 1 each day 30 minutes before meal  #30 x 11   Entered by:   Sherren Kerns RN   Authorized by:   Meryl Dare MD [facility]   Signed by:   Sherren Kerns RN on 08/05/2009   Method used:   Electronically to        [id number]* (retail)       [address] [place], [place]  [address]       Ph: [phone]       Fax: [phone]   RxID:   [id number]

## 2010-04-25 NOTE — Procedures (Signed)
Summary: Upper Endoscopy w/DIL  Patient: Eligha Kmetz Note: All result statuses are Final unless otherwise noted.  Tests: (1) Upper Endoscopy w/DIL (UED)  UED Upper Endoscopy w/DIL                             DONE     [facility]     [address]     [place], [place]  [address]           ENDOSCOPY PROCEDURE REPORT           PATIENT:  Rigo, Letts  MR#:  [id number]     BIRTHDATE:  [birth year], 73 yrs. old  GENDER:  male     ENDOSCOPIST:  Judie Petit T. Russella Dar, MD, [facility]           PROCEDURE DATE:  08/24/2009     PROCEDURE:  EGD with dilatation over guidewire     ASA CLASS:  Class III     INDICATIONS:  1) dilation of esophageal stricture  2) dysphagia     MEDICATIONS:  Fentanyl 50 mcg IV, Versed 4 mg IV     TOPICAL ANESTHETIC:  Exactacain Spray     DESCRIPTION OF PROCEDURE:   After the risks benefits and     alternatives of the procedure were thoroughly explained, informed     consent was obtained.  The [id number] endoscope was     introduced through the mouth and advanced to the second portion of     the duodenum, without limitations.  The instrument was slowly     withdrawn as the mucosa was carefully examined.     <<PROCEDUREIMAGES>>     A stricture was found in the proximal esophagus. It was focal,     benign appearing and circumferential. It was 12 mm in diameter.     Nodular mucosa was found in the mid esophagus. Likely scarring from     radiation. The stomach was entered and closely examined. The     pylorus, antrum, angularis, and lesser curvature were well     visualized, including a retroflexed view of the cardia and fundus.     The stomach wall was normally distensable. The scope passed easily     through the pylorus into the duodenum.  The duodenal bulb was     normal in appearance, as was the postbulbar duodenum.  Dilation     was then performed at the proximal esophagus           1) Dilator:  Savary over guidewire  Size:  13 mm     Resistance:   none  Heme:  yes, minimal           2) Dilator:  Savary over guidewire  Size:  14 mm     Resistance:  minimal  Heme:  yes, minimal           3) Dilator:  Savary over guidewire  Size:  15 mm     Resistance:  minimal  Heme:  yes, minimal           4) Dilator:  Savary over guidewire  Size:  16 mm     Resistance:  minimal  Heme:  yes, minimal           COMPLICATIONS:  None           ENDOSCOPIC IMPRESSION:     1) 12 mm stricture in the proximal esophagus  2) Nodular mucosa in the mid esophagus           RECOMMENDATIONS:     1) post dilation instructions     2) dilatations PRN     3) follow-up: referring MD and primary care MD as planned, GI     follow-up prn     4) continue PPI and antireflux measures long term           Lynore Coscia T. Russella Dar, MD, [facility]           CC: Mancel Bale, MD           n.     Rosalie DoctorVenita Lick. Antwone Capozzoli at 08/24/2009 12:16 PM           [name], [phone]  Note: An exclamation mark (!) indicates a result that was not dispersed into the flowsheet. Document Creation Date: 08/24/2009 12:17 PM _______________________________________________________________________  (1) Order result status: Final Collection or observation date-time: 08/24/2009 12:09 Requested date-time:  Receipt date-time:  Reported date-time:  Referring Physician:   Ordering Physician: Claudette Head [phone]) Specimen Source:  Source: Launa Grill Order Number: [id number] Lab site:

## 2010-04-25 NOTE — Letter (Signed)
[facility]   Imported By: Sherian Rein 07/22/2009 14:26:39  _____________________________________________________________________  External Attachment:    Type:   Image     Comment:   External Document

## 2010-04-27 NOTE — Progress Notes (Signed)
Summary: REQ FOR R  Phone Note Call from Patient Call back at Home Phone [phone]   Summary of Call: Pt c/o sinus pain & pressure & slightly productive cough He is req rx for antibiotic for what he feels is a sinus infection. OTC meds have given little relief.  Initial call taken by: Lamar Sprinkles, CMA,  March 16, 2010 2:50 PM  Follow-up for Phone Call        Use over-the-counter medicines for "cold": Tylenol  650mg  or Advil 400mg  every 6 hours  for fever; Delsym or Robutussin for cough. Mucinex or Mucinex D for congestion. Ricola or Halls for sore throat. OV today or tomorrow Follow-up by: Tresa Garter MD,  March 17, 2010 1:11 PM  Additional Follow-up for Phone Call Additional follow up Details #1::        Per spouse pt is feeling much better but will use meds if needed and call back for OV if not resloved. Additional Follow-up by: Margaret Pyle, CMA,  March 17, 2010 1:46 PM

## 2010-05-11 NOTE — Letter (Signed)
Summary: [facility]  [facility]   Imported By: Sherian Rein 05/03/2010 09:37:59  _____________________________________________________________________  External Attachment:    Type:   Image     Comment:   External Document

## 2010-05-17 NOTE — Letter (Signed)
Summary: [facility]  [facility]   Imported By: Lester Fuquay-Varina 05/09/2010 10:48:47  _____________________________________________________________________  External Attachment:    Type:   Image     Comment:   External Document

## 2010-05-25 ENCOUNTER — Encounter: Payer: Self-pay | Admitting: Internal Medicine

## 2010-05-25 ENCOUNTER — Ambulatory Visit: Payer: Medicare Other | Attending: Radiation Oncology | Admitting: Radiation Oncology

## 2010-05-29 ENCOUNTER — Other Ambulatory Visit: Payer: Self-pay | Admitting: Internal Medicine

## 2010-05-29 ENCOUNTER — Other Ambulatory Visit: Payer: Medicare Other

## 2010-05-29 ENCOUNTER — Encounter (INDEPENDENT_AMBULATORY_CARE_PROVIDER_SITE_OTHER): Payer: Self-pay | Admitting: *Deleted

## 2010-05-29 DIAGNOSIS — Z0000 Encounter for general adult medical examination without abnormal findings: Secondary | ICD-10-CM

## 2010-05-29 DIAGNOSIS — I10 Essential (primary) hypertension: Secondary | ICD-10-CM

## 2010-05-29 DIAGNOSIS — R7309 Other abnormal glucose: Secondary | ICD-10-CM

## 2010-05-29 DIAGNOSIS — T887XXA Unspecified adverse effect of drug or medicament, initial encounter: Secondary | ICD-10-CM

## 2010-05-29 LAB — URINALYSIS, ROUTINE W REFLEX MICROSCOPIC
Bilirubin Urine: NEGATIVE
Ketones, ur: NEGATIVE
Leukocytes, UA: NEGATIVE
Specific Gravity, Urine: 1.02 (ref 1.000–1.030)
Urine Glucose: NEGATIVE
Urobilinogen, UA: 0.2 (ref 0.0–1.0)

## 2010-05-29 LAB — CBC WITH DIFFERENTIAL/PLATELET
Eosinophils Relative: 5.4 % — ABNORMAL HIGH (ref 0.0–5.0)
HCT: 35.7 % — ABNORMAL LOW (ref 39.0–52.0)
Hemoglobin: 12.1 g/dL — ABNORMAL LOW (ref 13.0–17.0)
Lymphs Abs: 0.9 10*3/uL (ref 0.7–4.0)
MCV: 90.5 fl (ref 78.0–100.0)
Monocytes Absolute: 0.4 10*3/uL (ref 0.1–1.0)
Monocytes Relative: 11.9 % (ref 3.0–12.0)
Neutro Abs: 2 10*3/uL (ref 1.4–7.7)
Platelets: 181 10*3/uL (ref 150.0–400.0)
RDW: 16.1 % — ABNORMAL HIGH (ref 11.5–14.6)
WBC: 3.5 10*3/uL — ABNORMAL LOW (ref 4.5–10.5)

## 2010-05-29 LAB — BASIC METABOLIC PANEL
BUN: 15 mg/dL (ref 6–23)
CO2: 29 mEq/L (ref 19–32)
Chloride: 98 mEq/L (ref 96–112)
GFR: 60.96 mL/min (ref >60.00)
Glucose, Bld: 101 mg/dL — ABNORMAL HIGH (ref 70–99)
Potassium: 3.9 mEq/L (ref 3.5–5.1)
Sodium: 134 mEq/L — ABNORMAL LOW (ref 135–145)

## 2010-05-29 LAB — LIPID PANEL
HDL: 51.5 mg/dL (ref >39.00)
Total CHOL/HDL Ratio: 3
VLDL: 17.6 mg/dL (ref 0.0–40.0)

## 2010-05-29 LAB — HEMOGLOBIN A1C: Hgb A1c MFr Bld: 7 % — ABNORMAL HIGH (ref 4.6–6.5)

## 2010-05-29 LAB — TSH: TSH: 150.65 u[IU]/mL — ABNORMAL HIGH (ref 0.35–5.50)

## 2010-05-29 LAB — PSA: PSA: 1.81 ng/mL (ref 0.10–4.00)

## 2010-05-29 LAB — HEPATIC FUNCTION PANEL
AST: 35 U/L (ref 0–37)
Alkaline Phosphatase: 76 U/L (ref 39–117)
Total Bilirubin: 0.5 mg/dL (ref 0.3–1.2)

## 2010-06-01 ENCOUNTER — Encounter: Payer: Self-pay | Admitting: Internal Medicine

## 2010-06-02 ENCOUNTER — Encounter: Payer: Self-pay | Admitting: Internal Medicine

## 2010-06-02 ENCOUNTER — Ambulatory Visit (INDEPENDENT_AMBULATORY_CARE_PROVIDER_SITE_OTHER): Payer: Medicare Other | Admitting: Internal Medicine

## 2010-06-02 DIAGNOSIS — L910 Hypertrophic scar: Secondary | ICD-10-CM | POA: Insufficient documentation

## 2010-06-02 DIAGNOSIS — E039 Hypothyroidism, unspecified: Secondary | ICD-10-CM

## 2010-06-02 DIAGNOSIS — I10 Essential (primary) hypertension: Secondary | ICD-10-CM

## 2010-06-06 NOTE — Assessment & Plan Note (Signed)
Summary: 3 MO FU /NWS  #   Vital Signs:  Patient profile:   75 year old male Height:      72 inches Weight:      202 pounds BMI:     27.50 Temp:     98.8 degrees F oral Pulse rate:   88 / minute Pulse rhythm:   regular Resp:     16 per minute BP sitting:   120 / 68  (left arm) Cuff size:   regular  Vitals Entered By: Lanier Prude, CMA(AAMA) (June 02, 2010 1:16 PM)  Procedure Note Last Tetanus: given (08/26/2006)  Injections: The patient complains of swelling. Indication: keloid scar Consent signed: yes  Procedure # 1: keloid injection    Region: medial    Location: R neck base    Technique: 25 g needle    Medication: 5-10 mg depomedrol    Anesthesia: 0.5 ml 1% lidocaine w/o epinephrine    Comment: Risks including but not limited by incomplete procedure, bleeding, infection, recurrence were discussed with the patient. Consent form was signed.   Cleaned and prepped with: alcohol and betadine Wound dressing: bandaid Instructions: daily dressing changes  CC: f/u Is Patient Diabetic? No   Primary Care Provider:  Sula Soda, MD   CC:  f/u.  History of Present Illness: The patient presents for a follow up of GERD, wt loss, dysphagia, lipids We detected a very high TSH in recent labs  Current Medications (verified): 1)  Omeprazole 20 Mg Cpdr (Omeprazole) .Marland Kitchen.. 1 Once Daily 2)  Vitamin D3 1000 Unit  Tabs (Cholecalciferol) .Marland Kitchen.. 1 Qd 3)  Adult Aspirin Low Strength 81 Mg  Tbdp (Aspirin) .... Once Daily 4)  Loratadine 10 Mg  Tabs (Loratadine) .... Once Daily As Needed Allergies 5)  Fluticasone Propionate 50 Mcg/act  Susp (Fluticasone Propionate) .... 2 Sprays Each Nostril Once Daily 6)  Vitamin D 1000 Unit Tabs (Cholecalciferol) .... Once Daily 7)  Senna 187 Mg Tabs (Senna) .... As Needed 8)  Colace 100 Mg Caps (Docusate Sodium) .... As Needed 9)  Prochlorperazine Maleate 10 Mg Tabs (Prochlorperazine Maleate) .... As Needed For Nausea 10)  Zolpidem Tartrate 5 Mg  Tabs (Zolpidem Tartrate) .... At Bedtime 11)  Oxycodone Hcl 5 Mg Tabs (Oxycodone Hcl) .Marland Kitchen.. 1-2 Per Tube Bid As Needed 12)  Carafate 1 Gm/29ml Susp (Sucralfate) .... Once Daily 13)  Hydrocodone-Acetaminophen 7.5-500 Mg Tabs (Hydrocodone-Acetaminophen) .Marland Kitchen.. 1 Po Bid As Needed 14)  Simvastatin 20 Mg Tabs (Simvastatin) .Marland Kitchen.. 1 By Mouth Once Daily For Cholesterol 15)  Omeprazole 40 Mg  Cpdr (Omeprazole) .Marland Kitchen.. 1 Each Day 30 Minutes Before Meal 16)  Lorazepam 1 Mg Tabs (Lorazepam) .Marland Kitchen.. 1 Every 4 To 6 Hours As Needed Anxiety or Shaking 17)  Mupirocin 2 % Oint (Mupirocin) .... Use Two Times A Day W/dressing Change 18)  Dulera 200-5 Mcg/act Aero (Mometasone Furo-Formoterol Fum) .Marland Kitchen.. 1inh Bid 19)  Synthroid 200 Mcg Tabs (Levothyroxine Sodium) .Marland Kitchen.. 1 By Mouth Once Daily  Allergies (verified): 1)  ! 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         tired  Physical Exam  General:  alert and well-nourished.   Eyes:  No corneal or conjunctival inflammation noted. EOMI. Perrla. Nose:  External nasal examination shows no deformity or inflammation. Nasal mucosa are pink and moist without lesions or exudates. Mouth:  Oral mucosa and oropharynx without lesions or exudates.   Teeth in good repair. Neck:  No deformities, masses, or tenderness noted. Lungs:  Normal respiratory effort, chest expands symmetrically. Lungs are clear to auscultation, no crackles or wheezes. Heart:  Normal rate and regular rhythm. S1 and S2 normal without gallop, murmur, click, rub or other extra sounds. Abdomen:  Bowel sounds positive,abdomen soft and non-tender without masses, organomegaly or hernias noted. Msk:  No deformity or scoliosis noted of thoracic or lumbar spine.   Extremities:  no edema, no ulcers  Neurologic:  No cranial nerve deficits noted. Station and gait are normal. Plantar reflexes are down-going bilaterally. DTRs are symmetrical throughout. Sensory, motor and coordinative functions appear intact. Skin:  0.5x1.0 cm keloid scar at the R neck base Psych:  Cognition and judgment appear intact. Alert and cooperative with normal attention span and concentration. No apparent delusions, illusions, hallucinations   Impression & Recommendations:  Problem # 1:  HYPOTHYROIDISM (ICD-244.9) Assessment New Sonography Report: Indication: Clinical bedside ultrasound was performed using Terason 3000 machine with a linear transducer.  The images were stored in Terason.  His updated medication list for this problem includes:    Synthroid 200 Mcg Tabs (Levothyroxine sodium) .Marland Kitchen... 1 by mouth once daily  Problem # 2:  KELOID SCAR (ICD-701.4) Assessment: New  Injected  Orders: Depo-Medrol 20mg  (J1020) EMR Misc Charge Code [facility])  Problem # 3:  WEIGHT LOSS-ABNORMAL (ICD-783.21) Assessment: Improved  Problem # 4:  DYSPHAGIA UNSPECIFIED (ICD-787.20) Assessment: Improved  Problem # 5:  HYPERLIPIDEMIA (ICD-272.4) Assessment: Improved  His updated medication list for this problem includes:    Simvastatin 20 Mg Tabs (Simvastatin) .Marland Kitchen... 1 by mouth once daily for cholesterol  Labs Reviewed: SGOT: 35 (05/29/2010)   SGPT: 18 (05/29/2010)   HDL:51.50 (05/29/2010), 45.3  (02/05/2008)  LDL:80 (05/29/2010), 71 (02/05/2008)  Chol:149 (05/29/2010), 139 (02/05/2008)  Trig:88.0 (05/29/2010), 112 (02/05/2008)  Complete Medication List: 1)  Vitamin D3 1000 Unit Tabs (Cholecalciferol) .Marland Kitchen.. 1 qd 2)  Adult Aspirin Low Strength 81 Mg Tbdp (Aspirin) .... Once daily 3)  Loratadine 10 Mg Tabs (Loratadine) .... Once daily as needed allergies 4)  Fluticasone Propionate 50 Mcg/act Susp (Fluticasone propionate) .... 2 sprays each nostril once daily 5)  Vitamin D 1000 Unit Tabs (Cholecalciferol) .... Once daily 6)  Senna 187 Mg Tabs (Senna) .... As needed 7)  Colace 100 Mg Caps (Docusate sodium) .... As needed 8)  Prochlorperazine Maleate 10 Mg Tabs (Prochlorperazine maleate) .... As needed for nausea 9)  Zolpidem Tartrate 5 Mg Tabs (Zolpidem tartrate) .... At bedtime 10)  Oxycodone Hcl 5 Mg Tabs (Oxycodone hcl) .Marland Kitchen.. 1-2 per tube bid as needed 11)  Carafate 1 Gm/61ml Susp (Sucralfate) .... Once daily 12)  Hydrocodone-acetaminophen 7.5-500 Mg Tabs (Hydrocodone-acetaminophen) .Marland Kitchen.. 1 po bid as needed 13)  Simvastatin 20 Mg Tabs (Simvastatin) .Marland Kitchen.. 1 by mouth once daily for cholesterol 14)  Omeprazole 40 Mg Cpdr (Omeprazole) .Marland Kitchen.. 1 each day 30 minutes before meal 15)  Lorazepam 1 Mg Tabs (Lorazepam) .Marland KitchenMarland KitchenMarland Kitchen  1 every 4 to 6 hours as needed anxiety or shaking 16)  Mupirocin 2 % Oint (Mupirocin) .... Use two times a day w/dressing change 17)  Dulera 200-5 Mcg/act Aero (Mometasone furo-formoterol fum) .Marland Kitchen.. 1inh bid 18)  Synthroid 200 Mcg Tabs (Levothyroxine sodium) .Marland Kitchen.. 1 by mouth once daily 19)  Omeprazole 40 Mg Cpdr (Omeprazole) .Marland Kitchen.. 1 by mouth qam for indigestion  Patient Instructions: 1)  Low level of thyroid hormone - HYPOTHYROIDISM 2)  Please schedule a follow-up appointment in 4-6 weeks. 3)  TSH prior to visit, ICD-9: 244.80 Prescriptions: OMEPRAZOLE 40 MG CPDR (OMEPRAZOLE) 1 by mouth qam for indigestion  #90 x 3   Entered and Authorized by:   Tresa Garter MD   Signed by:    Tresa Garter MD on 06/02/2010   Method used:   Print then Give to Patient   RxID:   [id number]    Orders Added: 1)  Est. Patient Level IV [88416] 2)  Depo-Medrol 20mg  [J1020] 3)  EMR Misc Charge Code [EMRMisc]

## 2010-06-06 NOTE — Miscellaneous (Signed)
Summary: Synthroid  Clinical Lists Changes  Medications: Added new medication of SYNTHROID 200 MCG TABS (LEVOTHYROXINE SODIUM) 1 by mouth once daily - Signed Rx of SYNTHROID 200 MCG TABS (LEVOTHYROXINE SODIUM) 1 by mouth once daily;  #30 x 5;  Signed;  Entered by: Lanier Prude, CMA(AAMA);  Authorized by: Tresa Garter MD;  Method used: Electronically to [id number] [place], [place]  [address], Ph: [phone], Fax: [phone]    Prescriptions: SYNTHROID 200 MCG TABS (LEVOTHYROXINE SODIUM) 1 by mouth once daily  #30 x 5   Entered by:   Lanier Prude, [facility])   Authorized by:   Tresa Garter MD   Signed by:   Lanier Prude, CMA(AAMA) on 06/01/2010   Method used:   Electronically to        [facility] [id number] (retail)       [address] [place], [place]  [address]       Ph: [phone]       Fax: [phone]   RxID:   [id number]

## 2010-06-12 LAB — CBC
HCT: 32.5 % — ABNORMAL LOW (ref 39.0–52.0)
Hemoglobin: 10.7 g/dL — ABNORMAL LOW (ref 13.0–17.0)
MCHC: 32.8 g/dL (ref 30.0–36.0)
MCV: 92.3 fL (ref 78.0–100.0)
RBC: 3.53 MIL/uL — ABNORMAL LOW (ref 4.22–5.81)

## 2010-06-12 LAB — BASIC METABOLIC PANEL
CO2: 26 mEq/L (ref 19–32)
Chloride: 96 mEq/L (ref 96–112)
Creatinine, Ser: 0.83 mg/dL (ref 0.4–1.5)
GFR calc Af Amer: >60 mL/min (ref >60)
Potassium: 5 mEq/L (ref 3.5–5.1)

## 2010-06-12 LAB — DIFFERENTIAL
Basophils Relative: 0 % (ref 0–1)
Eosinophils Absolute: 0.1 10*3/uL (ref 0.0–0.7)
Eosinophils Relative: 1 % (ref 0–5)
Monocytes Absolute: 1 10*3/uL (ref 0.1–1.0)
Monocytes Relative: 15 % — ABNORMAL HIGH (ref 3–12)
Neutrophils Relative %: 65 % (ref 43–77)

## 2010-06-13 NOTE — Miscellaneous (Signed)
Summary: Scar Injection/[facility]  Scar Injection/[facility]   Imported By: Sherian Rein 06/07/2010 12:37:23  _____________________________________________________________________  External Attachment:    Type:   Image     Comment:   External Document

## 2010-06-13 NOTE — Letter (Signed)
Summary: [facility]  [facility]   Imported By: Sherian Rein 06/07/2010 12:19:17  _____________________________________________________________________  External Attachment:    Type:   Image     Comment:   External Document

## 2010-06-14 LAB — DIFFERENTIAL
Basophils Absolute: 0 10*3/uL (ref 0.0–0.1)
Basophils Relative: 0 % (ref 0–1)
Eosinophils Absolute: 0 10*3/uL (ref 0.0–0.7)
Neutro Abs: 8.5 10*3/uL — ABNORMAL HIGH (ref 1.7–7.7)
Neutrophils Relative %: 89 % — ABNORMAL HIGH (ref 43–77)

## 2010-06-14 LAB — CBC
MCHC: 33.3 g/dL (ref 30.0–36.0)
Platelets: 415 10*3/uL — ABNORMAL HIGH (ref 150–400)
RBC: 3.75 MIL/uL — ABNORMAL LOW (ref 4.22–5.81)
RDW: 13.9 % (ref 11.5–15.5)

## 2010-06-18 LAB — CBC
HCT: 24.2 % — ABNORMAL LOW (ref 39.0–52.0)
HCT: 25 % — ABNORMAL LOW (ref 39.0–52.0)
HCT: 25.2 % — ABNORMAL LOW (ref 39.0–52.0)
HCT: 27.1 % — ABNORMAL LOW (ref 39.0–52.0)
HCT: 27.7 % — ABNORMAL LOW (ref 39.0–52.0)
Hemoglobin: 8 g/dL — ABNORMAL LOW (ref 13.0–17.0)
Hemoglobin: 8.2 g/dL — ABNORMAL LOW (ref 13.0–17.0)
Hemoglobin: 8.8 g/dL — ABNORMAL LOW (ref 13.0–17.0)
MCHC: 31.9 g/dL (ref 30.0–36.0)
MCHC: 32.5 g/dL (ref 30.0–36.0)
MCV: 87.7 fL (ref 78.0–100.0)
MCV: 88.8 fL (ref 78.0–100.0)
MCV: 89.3 fL (ref 78.0–100.0)
Platelets: 178 10*3/uL (ref 150–400)
Platelets: 194 10*3/uL (ref 150–400)
Platelets: 213 10*3/uL (ref 150–400)
Platelets: 221 10*3/uL (ref 150–400)
RBC: 2.77 MIL/uL — ABNORMAL LOW (ref 4.22–5.81)
RBC: 3.16 MIL/uL — ABNORMAL LOW (ref 4.22–5.81)
RDW: 15.6 % — ABNORMAL HIGH (ref 11.5–15.5)
RDW: 16 % — ABNORMAL HIGH (ref 11.5–15.5)
RDW: 16.2 % — ABNORMAL HIGH (ref 11.5–15.5)
RDW: 16.2 % — ABNORMAL HIGH (ref 11.5–15.5)
RDW: 16.4 % — ABNORMAL HIGH (ref 11.5–15.5)
WBC: 1.1 10*3/uL — CL (ref 4.0–10.5)
WBC: 1.7 10*3/uL — ABNORMAL LOW (ref 4.0–10.5)

## 2010-06-18 LAB — CULTURE, BLOOD (ROUTINE X 2)
Culture: NO GROWTH
Culture: NO GROWTH

## 2010-06-18 LAB — URINALYSIS, ROUTINE W REFLEX MICROSCOPIC
Nitrite: NEGATIVE
Protein, ur: 30 mg/dL — AB
Specific Gravity, Urine: 1.02 (ref 1.005–1.030)
Urobilinogen, UA: 1 mg/dL (ref 0.0–1.0)

## 2010-06-18 LAB — URINE CULTURE
Colony Count: NO GROWTH
Culture: NO GROWTH
Special Requests: NEGATIVE

## 2010-06-18 LAB — COMPREHENSIVE METABOLIC PANEL
AST: 20 U/L (ref 0–37)
Albumin: 3.1 g/dL — ABNORMAL LOW (ref 3.5–5.2)
Alkaline Phosphatase: 60 U/L (ref 39–117)
Alkaline Phosphatase: 62 U/L (ref 39–117)
BUN: 12 mg/dL (ref 6–23)
CO2: 31 mEq/L (ref 19–32)
Chloride: 88 mEq/L — ABNORMAL LOW (ref 96–112)
Creatinine, Ser: 0.87 mg/dL (ref 0.4–1.5)
Creatinine, Ser: 0.92 mg/dL (ref 0.4–1.5)
GFR calc Af Amer: >60 mL/min (ref >60)
GFR calc non Af Amer: >60 mL/min (ref >60)
Glucose, Bld: 111 mg/dL — ABNORMAL HIGH (ref 70–99)
Potassium: 3.6 mEq/L (ref 3.5–5.1)
Potassium: 3.6 mEq/L (ref 3.5–5.1)
Total Bilirubin: 0.4 mg/dL (ref 0.3–1.2)
Total Bilirubin: 0.5 mg/dL (ref 0.3–1.2)
Total Protein: 7.3 g/dL (ref 6.0–8.3)

## 2010-06-18 LAB — CROSSMATCH
ABO/RH(D): B POS
Antibody Screen: NEGATIVE

## 2010-06-18 LAB — TSH: TSH: 1.777 u[IU]/mL (ref 0.350–4.500)

## 2010-06-18 LAB — URINE MICROSCOPIC-ADD ON

## 2010-06-18 LAB — DIFFERENTIAL
Basophils Absolute: 0 10*3/uL (ref 0.0–0.1)
Basophils Relative: 0 % (ref 0–1)
Basophils Relative: 1 % (ref 0–1)
Eosinophils Absolute: 0 10*3/uL (ref 0.0–0.7)
Eosinophils Absolute: 0 10*3/uL (ref 0.0–0.7)
Eosinophils Relative: 1 % (ref 0–5)
Lymphs Abs: 0.1 10*3/uL — ABNORMAL LOW (ref 0.7–4.0)
Lymphs Abs: 0.1 10*3/uL — ABNORMAL LOW (ref 0.7–4.0)
Monocytes Absolute: 0.3 10*3/uL (ref 0.1–1.0)
Monocytes Relative: 24 % — ABNORMAL HIGH (ref 3–12)
Neutro Abs: 0.7 10*3/uL — ABNORMAL LOW (ref 1.7–7.7)
Neutro Abs: 1 10*3/uL — ABNORMAL LOW (ref 1.7–7.7)
Neutrophils Relative %: 63 % (ref 43–77)

## 2010-06-18 LAB — BASIC METABOLIC PANEL
CO2: 28 mEq/L (ref 19–32)
Calcium: 8.4 mg/dL (ref 8.4–10.5)
GFR calc Af Amer: >60 mL/min (ref >60)
GFR calc non Af Amer: >60 mL/min (ref >60)
Glucose, Bld: 162 mg/dL — ABNORMAL HIGH (ref 70–99)
Potassium: 3.8 mEq/L (ref 3.5–5.1)
Sodium: 130 mEq/L — ABNORMAL LOW (ref 135–145)
Sodium: 131 mEq/L — ABNORMAL LOW (ref 135–145)

## 2010-06-18 LAB — LACTIC ACID, PLASMA: Lactic Acid, Venous: 1.5 mmol/L (ref 0.5–2.2)

## 2010-06-18 LAB — SODIUM, URINE, RANDOM: Sodium, Ur: 29 mEq/L

## 2010-06-26 ENCOUNTER — Other Ambulatory Visit: Payer: Self-pay | Admitting: Internal Medicine

## 2010-07-03 ENCOUNTER — Other Ambulatory Visit: Payer: Medicare Other

## 2010-07-03 ENCOUNTER — Other Ambulatory Visit: Payer: Self-pay | Admitting: Internal Medicine

## 2010-07-03 DIAGNOSIS — E038 Other specified hypothyroidism: Secondary | ICD-10-CM

## 2010-07-03 LAB — BASIC METABOLIC PANEL
BUN: 13 mg/dL (ref 6–23)
Calcium: 10.2 mg/dL (ref 8.4–10.5)
Creatinine, Ser: 1.2 mg/dL (ref 0.4–1.5)
GFR calc non Af Amer: 60 mL/min — ABNORMAL LOW (ref >60)
Potassium: 3.9 mEq/L (ref 3.5–5.1)

## 2010-07-05 ENCOUNTER — Other Ambulatory Visit (INDEPENDENT_AMBULATORY_CARE_PROVIDER_SITE_OTHER): Payer: Medicare Other

## 2010-07-05 DIAGNOSIS — E038 Other specified hypothyroidism: Secondary | ICD-10-CM

## 2010-07-05 LAB — TSH: TSH: 0.11 u[IU]/mL — ABNORMAL LOW (ref 0.35–5.50)

## 2010-07-06 ENCOUNTER — Encounter: Payer: Self-pay | Admitting: Internal Medicine

## 2010-07-06 ENCOUNTER — Ambulatory Visit (INDEPENDENT_AMBULATORY_CARE_PROVIDER_SITE_OTHER): Payer: Medicare Other | Admitting: Internal Medicine

## 2010-07-06 DIAGNOSIS — E039 Hypothyroidism, unspecified: Secondary | ICD-10-CM

## 2010-07-06 DIAGNOSIS — E785 Hyperlipidemia, unspecified: Secondary | ICD-10-CM

## 2010-07-06 DIAGNOSIS — R7309 Other abnormal glucose: Secondary | ICD-10-CM

## 2010-07-06 DIAGNOSIS — L910 Hypertrophic scar: Secondary | ICD-10-CM

## 2010-07-06 MED ORDER — LEVOTHYROXINE SODIUM 175 MCG PO TABS: 175.0000 ug | ORAL_TABLET | Freq: Every day | ORAL | Status: DC

## 2010-07-06 NOTE — Assessment & Plan Note (Signed)
Better w/treatment we did

## 2010-07-06 NOTE — Assessment & Plan Note (Signed)
Cont w/meds 

## 2010-07-06 NOTE — Assessment & Plan Note (Signed)
Cont to monitor

## 2010-07-06 NOTE — Assessment & Plan Note (Signed)
Will adjust meds. Labs reviewed

## 2010-07-06 NOTE — Progress Notes (Signed)
  Subjective:    Patient ID: Aaron [name], male    DOB: 08-18-1935, 75 y.o.   MRN: [id number]  HPI The patient presents for a follow-up of  chronic hypertension, chronic dyslipidemia, type 2 prediabetes controlled with diet and low thyroid function Wt Readings from Last 3 Encounters:  07/06/10 196 lb (88.905 kg)  06/02/10 202 lb (91.627 kg)  02/24/10 197 lb (89.359 kg)     Review of Systems  Constitutional: Positive for appetite change and fatigue (not too bad). Negative for chills.  Respiratory: Negative for cough.   Genitourinary: Negative for dysuria and testicular pain.  Musculoskeletal: Negative for gait problem.  Neurological: Negative for numbness.  Psychiatric/Behavioral: Positive for dysphoric mood. Negative for behavioral problems.       Objective:   Physical Exam  Constitutional: He is oriented to person, place, and time. He appears well-developed.  HENT:  Mouth/Throat: Oropharynx is clear and moist.  Eyes: Conjunctivae are normal. Pupils are equal, round, and reactive to light.  Neck: Normal range of motion. No JVD present. No thyromegaly present.  Cardiovascular: Normal rate, regular rhythm, normal heart sounds and intact distal pulses.  Exam reveals no gallop and no friction rub.   No murmur heard. Pulmonary/Chest: Effort normal and breath sounds normal. No respiratory distress. He has no wheezes. He has no rales. He exhibits no tenderness.  Abdominal: Soft. Bowel sounds are normal. He exhibits no distension and no mass. There is no tenderness. There is no rebound and no guarding.  Musculoskeletal: Normal range of motion. He exhibits no edema and no tenderness.  Lymphadenopathy:    He has no cervical adenopathy.  Neurological: He is alert and oriented to person, place, and time. He has normal reflexes. No cranial nerve deficit. He exhibits normal muscle tone. Coordination normal.  Skin: Skin is warm and dry. No rash noted.       R neck keloid is better    Psychiatric: He has a normal mood and affect. His behavior is normal. Judgment and thought content normal.          Assessment & Plan:  ABNORMAL GLUCOSE NEC Cont to monitor  HYPERLIPIDEMIA Cont w/meds  HYPOTHYROIDISM Will adjust meds. Labs reviewed  Keloid scar Better w/treatment we did

## 2010-07-26 ENCOUNTER — Other Ambulatory Visit: Payer: Self-pay | Admitting: Internal Medicine

## 2010-08-02 ENCOUNTER — Telehealth: Payer: Self-pay | Admitting: *Deleted

## 2010-08-02 NOTE — Telephone Encounter (Signed)
Pt c/o on going sinus "infection" and drainage. Advised him to restart his clartin and call in the am for "same day" apt.

## 2010-08-03 ENCOUNTER — Ambulatory Visit (INDEPENDENT_AMBULATORY_CARE_PROVIDER_SITE_OTHER): Payer: Medicare Other | Admitting: Internal Medicine

## 2010-08-03 ENCOUNTER — Encounter: Payer: Self-pay | Admitting: Internal Medicine

## 2010-08-03 VITALS — BP 102/80 | HR 76 | Temp 98.7°F | Resp 16 | Ht 75.5 in | Wt 196.0 lb

## 2010-08-03 DIAGNOSIS — J019 Acute sinusitis, unspecified: Secondary | ICD-10-CM

## 2010-08-03 MED ORDER — DOXYCYCLINE HYCLATE 50 MG PO CAPS: 50.0000 mg | ORAL_CAPSULE | Freq: Two times a day (BID) | ORAL | Status: AC

## 2010-08-03 NOTE — Progress Notes (Signed)
  Subjective:    Patient ID: Aaron [name], male    DOB: 03/08/1936, 75 y.o.   MRN: [id number]  HPI   HPI  C/o URI sx's x   10-14 days. C/o ST, cough, weakness. Not better with OTC medicines. Actually, the patient is getting worse. Review of Systems  Constitutional: Positive for fever, chills and fatigue.  HENT: Positive for congestion, rhinorrhea  and postnasal drip.   Eyes: Positive for photophobia and pain. Negative for discharge and visual disturbance.  Respiratory: Positive for cough and wheezing.   Positive for chest pain.  Gastrointestinal: Negative for vomiting, abdominal pain, diarrhea and abdominal distention.  Genitourinary: Negative for dysuria and difficulty urinating.  Skin: Negative for rash.  Neurological: Positive for dizziness, weakness and light-headedness.      Review of Systems     Objective:   Physical Exam  Constitutional: He is oriented to person, place, and time. He appears well-developed.  HENT:  Mouth/Throat: Oropharynx is clear and moist.       Nasal d/c wight  Eyes: Conjunctivae are normal. Pupils are equal, round, and reactive to light.  Neck: Normal range of motion. No JVD present. No thyromegaly present.  Cardiovascular: Normal rate, regular rhythm, normal heart sounds and intact distal pulses.  Exam reveals no gallop and no friction rub.   No murmur heard. Pulmonary/Chest: Effort normal and breath sounds normal. No respiratory distress. He has no wheezes. He has no rales. He exhibits no tenderness.  Abdominal: Soft. Bowel sounds are normal. He exhibits no distension and no mass. There is no tenderness. There is no rebound and no guarding.  Musculoskeletal: Normal range of motion. He exhibits no edema and no tenderness.  Lymphadenopathy:    He has no cervical adenopathy.  Neurological: He is alert and oriented to person, place, and time. He has normal reflexes. No cranial nerve deficit. He exhibits normal muscle tone. Coordination normal.    Skin: Skin is warm and dry. No rash noted.  Psychiatric: He has a normal mood and affect. His behavior is normal. Judgment and thought content normal.          Assessment & Plan:  SINUSITIS, ACUTE Doxycycline x 10 d

## 2010-08-03 NOTE — Assessment & Plan Note (Signed)
Doxycycline x 10 d

## 2010-08-08 NOTE — Cardiovascular Report (Signed)
NAMERITIK, STAVOLA NO.:  [id number]   MEDICAL RECORD NO.:  [id number]          PATIENT TYPE:  OIB   LOCATION:  2899                         FACILITY:  [facility]   PHYSICIAN:  Nicki Guadalajara, M.D.     DATE OF BIRTH:  [birth year]   DATE OF PROCEDURE:  09/08/2008  DATE OF DISCHARGE:  09/08/2008                            CARDIAC CATHETERIZATION   PROCEDURE:  Cine coronary angiography; biplane cine left  ventriculography; distal aortography.   PROFILE:  [name] is a 75 year old African American male who  has a history of hypertension with diastolic dysfunction, and  hyperlipidemia.  He has noticed some vague episodes of chest pain.  He  recently underwent a nuclear perfusion study on Aug 18, 2008, which was  changed from our prior study of 2006 and suggested mild-to-moderate  region of scar involving the anterior wall and apical inferolateral  segment.  Because of his change in his nuclear study with his  symptomatology, definitive catheterization was recommended.   PROCEDURE:  After premedication with Valium 5 mg intravenously, the  patient was prepped and draped in usual fashion.  His right femoral  artery was punctured anteriorly and a 5-French sheath was inserted.  Diagnostic cardiac catheterization was done utilizing 5-French Judkins 4  left and right coronary catheters.  A 5-French pigtail catheter was used  for biplane cine left ventriculography.  With the patient's hypertensive  history and questionable claudication symptomatology, distal aortography  was also performed.  He tolerated the procedure well and returned to his  room in satisfactory condition.   HEMODYNAMIC DATA:  Central aortic pressure was 103/59.  Left ventricular  pressure was 103/11.   ANGIOGRAPHIC DATA:  Left main coronary artery was angiographically  normal and bifurcated into the LAD and left circumflex system.   The LAD had smooth ostial narrowing of 30% and was a large  vessel, which  wrapped around the LV apex.  The LAD gave rise to one major bifurcating  diagonal vessel and several septal perforating arteries.  The remainder  of the LAD is free of significant disease.   The circumflex vessel is a dominant vessel that had 20% narrowing in the  OM-1 vessel.  The distal circumflex ended in a PLA-like vessel.   Right coronary was a small vessel, which supplied a small PDA-like  system.  The RCA and its branches were normal.   Biplane cine left ventriculography revealed normal LV function.  There  was a suggestion of concentric LVH.  There was no significant wall  motion abnormality.  Specifically, the apex did contract.   Distal aortography revealed widely patent renal arteries.  There was no  significant aortoiliac disease.   IMPRESSION:  1. Normal left ventricular function.  2. Mild coronary obstructive disease with smooth 30% ostial narrowing      of the left anterior descending artery and 20% narrowing of the      circumflex marginal 1 vessel.   RECOMMENDATIONS:  Medical therapy with continued aggressive lipid  management and blood pressure control.           ______________________________  Nicki Guadalajara,  M.D.     TK/MEDQ  D:  09/08/2008  T:  09/08/2008  Job:  [id number]

## 2010-08-11 NOTE — Assessment & Plan Note (Signed)
[facility]                           PRIMARY CARE OFFICE NOTE   NAME:Aaron [name], Aaron [name]                      MRN:          [id number]  DATE:04/03/2006                            DOB:          1936/02/17    PROCEDURE:  Cryosurgery.   INDICATION:  Warts on the face.   The risks including incomplete procedure, scar formation, recurrence and  others as well as benefits were explained to the patient in detail, and  he agreed to proceed.   A total number of 4 warts on the face and 1 on the right lateral neck  were treated with liquid nitrogen in the usual fashion.  He tolerated  well.   COMPLICATIONS:  None.     Georgina Quint. Plotnikov, MD  Electronically Signed    AVP/MedQ  DD: 04/06/2006  DT: 04/07/2006  Job #: [id number]

## 2010-08-11 NOTE — Discharge Summary (Signed)
NAMESHAMON, COTHRAN                         ACCOUNT NO.:  [id number]   MEDICAL RECORD NO.:  [id number]                   PATIENT TYPE:  INP   LOCATION:  0481                                 FACILITY:  [facility]   PHYSICIAN:  Georges Lynch. Darrelyn Hillock, M.D.             DATE OF BIRTH:  03-27-1935   DATE OF ADMISSION:  01/29/2003  DATE OF DISCHARGE:  01/31/2003                                 DISCHARGE SUMMARY   ADMISSION DIAGNOSES:  1. Spinal stenosis.  2. Synovial cyst.  3. Hypertension.  4. Gastroesophageal reflux disease.   DISCHARGE DIAGNOSES:  1. Excision of synovial cyst.  2. Decompression L4-L5 for spinal stenosis.  3. Hypertension.  4. Gastroesophageal reflux disease.   PROCEDURE:  The patient was taken to the operating room on January 29, 2003  to undergo central decompression L4-L5 for spinal stenosis and excision of a  large synovial cyst L4-L5 on the right.  He also had a partial facetectomy  of the inferior facet L4-L5 on the right, foraminotomy of the L5 root on the  right.  Surgeon Georges Lynch. Darrelyn Hillock, M.D.  Assistant Marlowe Kays, M.D.  Surgery was performed under general anesthesia.   CONSULTS:  PT.   BRIEF HISTORY:  This patient is a 75 year old male who has had back pain for  some time.  Back pain has been worsening causing pain radiating into his  lower extremities, more the right than the left.  He had an MRI which  revealed a large synovial cyst L4-L5 on the right compressing the L5 nerve  root and spinal stenosis at the same level.  Due to the severity of the pain  in his right leg he elected to proceed with surgery.  The patient was  admitted to [facility] for same.   LABORATORY DATA:  Admission hemoglobin 13.4, hematocrit 39.3.  Admission  chemistry normal except for slightly elevated glucose at 123, calcium  slightly elevated at 10.8.  Admission EKG revealed a normal sinus rhythm,  rate of 68.  Admission chest x-ray was negative for active  disease.   HOSPITAL COURSE:  The patient was admitted to [facility] and taken  to the operating room.  He underwent the above stated procedure without  complications.  He tolerated the procedure well and was allowed to return to  the recovery room and then to the orthopedic floor to continue his  postoperative care.  The patient was placed on PC analgesia for pain  control.  The patient was weaned over to oral analgesics.  Within 24 hours  PCA was discontinued.  Physical therapy was consulted for gait training,  ambulation.  The patient progressed very well and was allowed to be  discharged home by postoperative day two.   DISPOSITION:  Discharge date January 31, 2003.   DISCHARGE MEDICATIONS:  1. The patient is to resume home medications.  2. Percocet 5/325 one pill q.6h. as needed for  pain.  3. Robaxin 500 mg one pill q.8h. as needed for muscle spasm.  4. Keflex 250 mg one q.i.d. as needed for muscle spasm.   DIET:  As tolerated.   ACTIVITY:  No bending at the waist.  No lifting.   WOUND CARE:  Dressing changed daily.  Keep dry and clean.   FOLLOWUP:  The patient is to follow up with Windy Fast A. Gioffre, M.D. in the  office in one week.  He should call the office to schedule the appointment.   CONDITION ON DISCHARGE:  Improved.     Ebbie Ridge. Paitsel, P.A.                     Ronald A. Darrelyn Hillock, M.D.    Tilden Dome  D:  03/01/2003  T:  03/01/2003  Job:  [id number]

## 2010-08-11 NOTE — Assessment & Plan Note (Signed)
[facility]                           PRIMARY CARE OFFICE NOTE   NAME:DONNELLJavanni, Maring                      MRN:          [id number]  DATE:02/20/2006                            DOB:          [birth year]    HISTORY OF PRESENT ILLNESS:  The patient is a 75 year old male who  presents for examination.  Past medical history, family history, social  history as per February 18, 2005, note.   CURRENT MEDICATIONS:  Reviewed.   ALLERGIES:  Reviewed with the patient.   REVIEW OF SYSTEMS:  Negative for chest pain or shortness of breath,  nausea, IA-like symptoms, growth in the right side of his neck.  The  rest of the 10-point review of systems is negative.   PHYSICAL EXAMINATION:  VITAL SIGNS:  Blood pressure 118/72, pulse 80,  temperature 98.1, weight 202 pounds, looks well.  HEENT:  Moist mucosa.  NECK:  Supple.  No thyromegaly or bruits.  Growth in the right lateral  neck.  LUNGS:  Clear.  No wheezing or rales.  HEART:  S1, S2.  No murmur or gallops.  ABDOMEN:  Soft and nontender.  No organomegaly, mass or hernia.  SKIN:  Without edema.  NEUROLOGICAL:  Alert and appropriate.  Denies any distress.  RECTAL:  Slightly enlarged prostate, no nodules.  Stool guaiac negative.   LABORATORY DATA:  EKG was normal sinus rhythm.   ASSESSMENT/PLAN:  1. Normal wellness examination.  Age/health-related issues discussed.      Healthy lifestyle discussed.  Obtained workup appropriate for age.      She has had a flu shot.  Obtained chest x-ray; information      regarding Zostavax provided.  2. Hypertension.  Continue current therapy.  3. Dyslipidemia. Continue current therapy.  Obtain lab work.  4. Chronic obstructive pulmonary disease, doing well.  5. Skin lesion.  Will obtain a biopsy.     Georgina Quint. Plotnikov, MD  Electronically Signed    AVP/MedQ  DD: 02/23/2006  DT: 02/25/2006  Job #: [id number]

## 2010-08-11 NOTE — Op Note (Signed)
Aaron [name], Aaron [name]                         ACCOUNT NO.:  [id number]   MEDICAL RECORD NO.:  [id number]                   PATIENT TYPE:  AMB   LOCATION:  DAY                                  FACILITY:  [facility]   PHYSICIAN:  Ronald A. Darrelyn Hillock, M.D.             DATE OF BIRTH:  [birth year]   DATE OF PROCEDURE:  01/29/2003  DATE OF DISCHARGE:                                 OPERATIVE REPORT   SURGEON:  Georges Lynch. Darrelyn Hillock, M.D.   ASSISTANT:  Marlowe Kays, M.D.   PREOPERATIVE DIAGNOSES:  1. Spinal stenosis at L4-5.  2. Large synovial cyst at L4-5 on the right depressing the L5 nerve root     with weakness of his toe extensors on the right.  All of  his symptoms     were on the right.   POSTOPERATIVE DIAGNOSES:  1. Spinal stenosis at L4-5.  2. Large synovial cyst at L4-5 on the right compressing the L5 nerve root     with weakness of his toe extensors on the right.  All of  his symptoms     were on the right.   OPERATION/PROCEDURE:  1. Central decompression at L4-5 for spinal stenosis.  2. Partial facetectomy of the inferior facet of L4-5 on the right.  3. Foraminotomy of the L5 root on the right.  4. Excision of a large synovial cyst at L4-5 on the right.   DESCRIPTION OF PROCEDURE:  Under general anesthesia, routine orthopedic prep  and draping of the back was carried out.  The patient had 1 g of IV Ancef.  At this time, two needles were placed in the back for localization purposes,  and an x-ray was taken.  Once we identified the L4-5 space, incision was  made over the L4-5 space and carried proximally and distally.  Bleeders were  then applied and cauterized.  At this time I stripped the muscles from the  underlying spinous process and lamina.  I then identified the L3 and the L4  spinous process utilized Kocher clamps on each and another x-ray was taken.  Once we identified the L4-5 interspace, I then carried out a complete  bilateral central decompression at L4-5.  Another  x-ray was taken to verify  the exact disk space region.  Following this, we then removed the ligamentum  flavum in the usual fashion by way of the use of the microscope.  I first  identified the dura and then identified the lateral recess area.  The disk  was flat.  There was no herniated disk.  I identified the L5 nerve root on  the right. There was a large synovial cyst that came out of the inferior  facet.  I did a partial facetectomy and removed the cyst.  We were able to  easily trace the root out into the foramen and the axillary region of the  root was intact as well.  I  thoroughly irrigated out the area.  Good  hemostasis was maintained.  We loosely applied the thrombin-soaked Gelfoam  and I inserted a quarter-inch Penrose drain for drainage purposes.  The  wound then was closed in layers in the usual fashion.  A small portion of  the deep portion of the muscle region was left open for drainage purposes.  The main part of the wound was closed in layers in the usual fashion.  The  skin was closed with metal staples.  A sterile Neosporin dressing was  applied.                                               Ronald A. Darrelyn Hillock, M.D.    RAG/MEDQ  D:  01/29/2003  T:  01/29/2003  Job:  [id number]

## 2010-08-12 ENCOUNTER — Other Ambulatory Visit: Payer: Self-pay | Admitting: Gastroenterology

## 2010-08-14 NOTE — Telephone Encounter (Signed)
NEEDS OFFICE VISIT.

## 2010-08-17 ENCOUNTER — Encounter (HOSPITAL_BASED_OUTPATIENT_CLINIC_OR_DEPARTMENT_OTHER): Payer: Medicare Other | Admitting: Oncology

## 2010-08-17 DIAGNOSIS — R1310 Dysphagia, unspecified: Secondary | ICD-10-CM

## 2010-08-17 DIAGNOSIS — C154 Malignant neoplasm of middle third of esophagus: Secondary | ICD-10-CM

## 2010-08-23 ENCOUNTER — Telehealth: Payer: Self-pay | Admitting: *Deleted

## 2010-08-23 NOTE — Telephone Encounter (Signed)
Pt c/o continued sinus pressure and pain - also ear pressure. Patient requesting alt rx for "stronger" abx.

## 2010-08-24 MED ORDER — MOXIFLOXACIN HCL 400 MG PO TABS: 400.0000 mg | ORAL_TABLET | Freq: Every day | ORAL | Status: AC

## 2010-08-24 MED ORDER — MOXIFLOXACIN HCL 400 MG PO TABS: 400.0000 mg | ORAL_TABLET | Freq: Every day | ORAL | Status: DC

## 2010-08-24 NOTE — Telephone Encounter (Signed)
Pt's son aware, rx sent

## 2010-08-24 NOTE — Telephone Encounter (Signed)
Ok avelox thx

## 2010-08-25 ENCOUNTER — Other Ambulatory Visit: Payer: Self-pay | Admitting: Internal Medicine

## 2010-09-21 ENCOUNTER — Other Ambulatory Visit: Payer: Self-pay | Admitting: Internal Medicine

## 2010-09-25 ENCOUNTER — Ambulatory Visit (INDEPENDENT_AMBULATORY_CARE_PROVIDER_SITE_OTHER): Payer: Medicare Other | Admitting: Internal Medicine

## 2010-09-25 ENCOUNTER — Telehealth: Payer: Self-pay | Admitting: Internal Medicine

## 2010-09-25 ENCOUNTER — Other Ambulatory Visit (INDEPENDENT_AMBULATORY_CARE_PROVIDER_SITE_OTHER): Payer: Medicare Other

## 2010-09-25 ENCOUNTER — Encounter: Payer: Self-pay | Admitting: Internal Medicine

## 2010-09-25 DIAGNOSIS — R7309 Other abnormal glucose: Secondary | ICD-10-CM

## 2010-09-25 DIAGNOSIS — K222 Esophageal obstruction: Secondary | ICD-10-CM

## 2010-09-25 DIAGNOSIS — E039 Hypothyroidism, unspecified: Secondary | ICD-10-CM

## 2010-09-25 DIAGNOSIS — K219 Gastro-esophageal reflux disease without esophagitis: Secondary | ICD-10-CM

## 2010-09-25 DIAGNOSIS — C154 Malignant neoplasm of middle third of esophagus: Secondary | ICD-10-CM

## 2010-09-25 DIAGNOSIS — R739 Hyperglycemia, unspecified: Secondary | ICD-10-CM

## 2010-09-25 DIAGNOSIS — I10 Essential (primary) hypertension: Secondary | ICD-10-CM

## 2010-09-25 LAB — BASIC METABOLIC PANEL
BUN: 17 mg/dL (ref 6–23)
Calcium: 9.8 mg/dL (ref 8.4–10.5)
Creatinine, Ser: 1.3 mg/dL (ref 0.4–1.5)
GFR: 72.19 mL/min (ref >60.00)
Glucose, Bld: 99 mg/dL (ref 70–99)
Potassium: 4.2 mEq/L (ref 3.5–5.1)

## 2010-09-25 LAB — TSH: TSH: 0.04 u[IU]/mL — ABNORMAL LOW (ref 0.35–5.50)

## 2010-09-25 MED ORDER — LEVOTHYROXINE SODIUM 150 MCG PO TABS: 150.0000 ug | ORAL_TABLET | Freq: Every day | ORAL | Status: DC

## 2010-09-25 MED ORDER — ALPRAZOLAM 1 MG PO TABS: 1.0000 mg | ORAL_TABLET | Freq: Every evening | ORAL | Status: DC | PRN

## 2010-09-25 NOTE — Progress Notes (Signed)
  Subjective:    Patient ID: Aaron [name], male    DOB: 1935/10/12, 75 y.o.   MRN: [id number]  HPI    HPI   F/u HTN, fatigue, elev glu, cancer   Review of Systems  Constitutional: Negative for appetite change, fatigue and unexpected weight change.  HENT: Negative for nosebleeds, congestion, sore throat, sneezing, trouble swallowing and neck pain.   Eyes: Negative for itching and visual disturbance.  Respiratory: Negative for cough.   Cardiovascular: Negative for chest pain, palpitations and leg swelling.  Gastrointestinal: Negative for nausea, diarrhea, blood in stool and abdominal distention.  Genitourinary: Negative for frequency and hematuria.  Musculoskeletal: Negative for back pain, joint swelling and gait problem.  Skin: Negative for rash.  Neurological: Negative for dizziness, tremors, speech difficulty and weakness.  Psychiatric/Behavioral: Negative for sleep disturbance, dysphoric mood and agitation. The patient is not nervous/anxious.        Objective:   Physical Exam  Constitutional: He is oriented to person, place, and time. He appears well-developed. No distress.  HENT:  Mouth/Throat: Oropharynx is clear and moist.       Nasal d/c wight  Eyes: Conjunctivae are normal. Pupils are equal, round, and reactive to light. Left eye exhibits no discharge.  Neck: Normal range of motion. No JVD present. No thyromegaly present.  Cardiovascular: Normal rate, regular rhythm, normal heart sounds and intact distal pulses.  Exam reveals no gallop and no friction rub.   No murmur heard. Pulmonary/Chest: Effort normal and breath sounds normal. No respiratory distress. He has no wheezes. He has no rales. He exhibits no tenderness.  Abdominal: Soft. Bowel sounds are normal. He exhibits no distension and no mass. There is no tenderness. There is no rebound and no guarding.  Musculoskeletal: Normal range of motion. He exhibits no tenderness.  Lymphadenopathy:    He has no cervical  adenopathy.  Neurological: He is alert and oriented to person, place, and time. He has normal reflexes. No cranial nerve deficit. He exhibits normal muscle tone. Coordination normal.  Skin: Skin is warm and dry. No rash noted.  Psychiatric: He has a normal mood and affect. His behavior is normal. Judgment and thought content normal.       Lab Results  Component Value Date   WBC 3.5* 05/29/2010   HGB 12.1* 05/29/2010   HCT 35.7* 05/29/2010   PLT 181.0 05/29/2010   CHOL 149 05/29/2010   TRIG 88.0 05/29/2010   HDL 51.50 05/29/2010   ALT 18 05/29/2010   AST 35 05/29/2010   NA 134* 05/29/2010   K 3.9 05/29/2010   CL 98 05/29/2010   CREATININE 1.5 05/29/2010   BUN 15 05/29/2010   CO2 29 05/29/2010   TSH 0.11* 07/05/2010   PSA 1.81 05/29/2010   INR 1.19 04/28/2009   HGBA1C 7.0* 05/29/2010       Assessment & Plan:

## 2010-09-25 NOTE — Assessment & Plan Note (Signed)
On Rx 

## 2010-09-25 NOTE — Assessment & Plan Note (Signed)
Resolved

## 2010-09-25 NOTE — Assessment & Plan Note (Signed)
F/u with [facility]

## 2010-09-25 NOTE — Telephone Encounter (Signed)
Aaron [name] , please, inform the patient: labs are OK, except for abn Thyroid. New synthroid dose is  150 mcg/d   Please, keep  next office visit appointment.   Thank you !

## 2010-09-26 NOTE — Telephone Encounter (Signed)
Pt informed

## 2010-10-07 ENCOUNTER — Other Ambulatory Visit: Payer: Self-pay | Admitting: Gastroenterology

## 2010-10-12 ENCOUNTER — Other Ambulatory Visit: Payer: Self-pay | Admitting: *Deleted

## 2010-10-12 NOTE — Telephone Encounter (Signed)
Pt is requesting a refill of Lorazepam to help with shakiness and anxiety-pt states that med helps him to calm down-please advise

## 2010-10-12 NOTE — Telephone Encounter (Signed)
He has alprazolam - a similar med (ok to ref x3) Thx

## 2010-10-13 IMAGING — CT CT CHEST W/ CM
2 of 4 series · 15 of 36 positions shown, 18 images · IV contrast (agent unspecified)
Comparison: 04/08/2009

CLINICAL DATA: Follow-up esophageal carcinoma.  Fever.  Previous
chemotherapy and radiation therapy.

CT CHEST WITH CONTRAST
TECHNIQUE: Multidetector CT imaging of the chest was performed
following the standard protocol during bolus administration of
intravenous contrast.
Contrast: 80 ml Bmnipaque-UUU

[Series 2: chest with st · axial · 0.70mm/px · z∈[-218,+82]mm · 12 of 71 slices shown, 15 images]
[im 6/71  mediastinal]
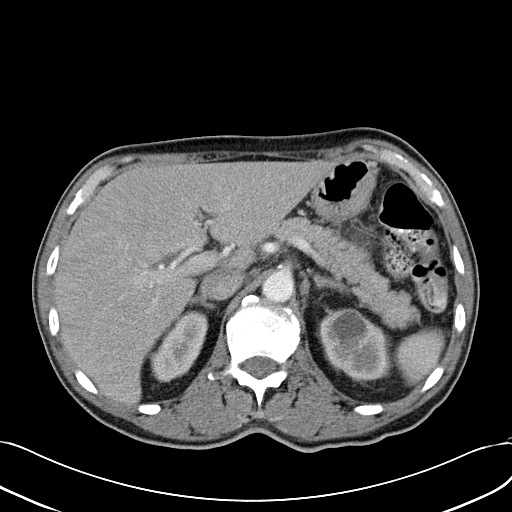
[im 6/71  lung]
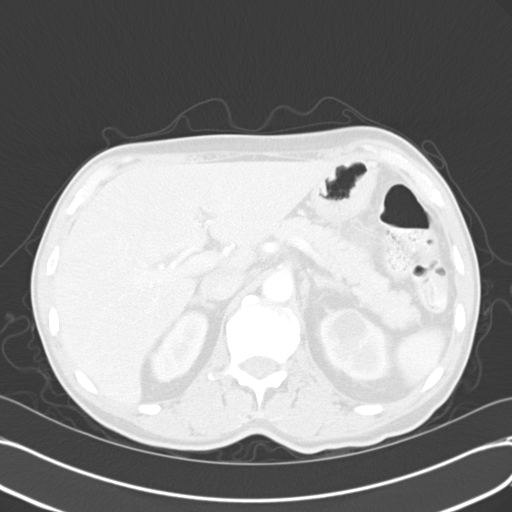
[im 11/71  lung]
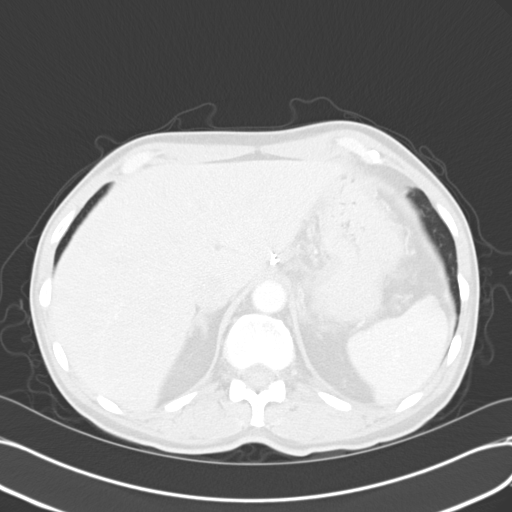
[im 16/71  lung]
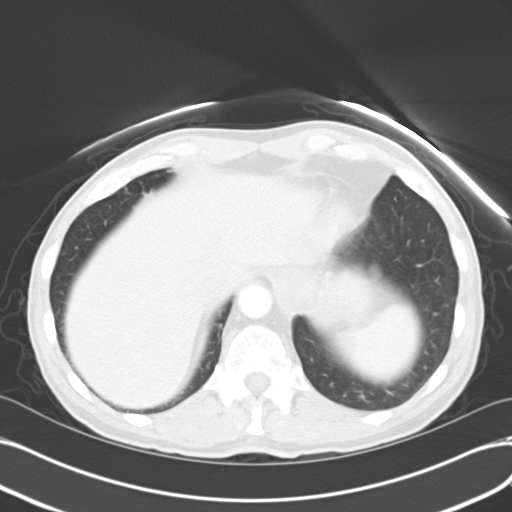
[im 21/71  lung]
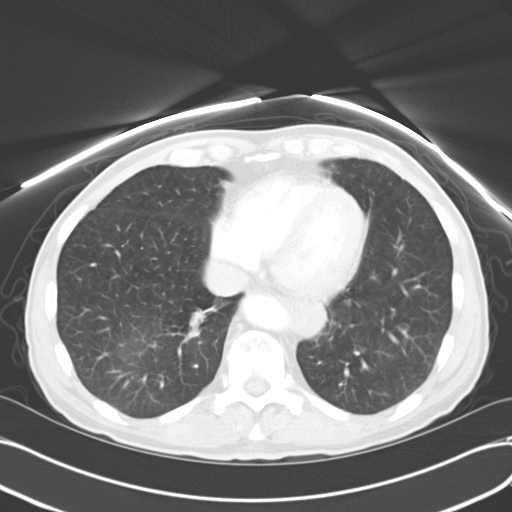
[im 26/71  mediastinal]
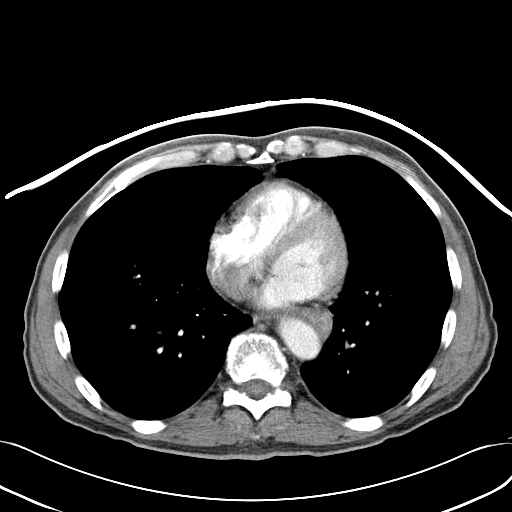
[im 26/71  lung]
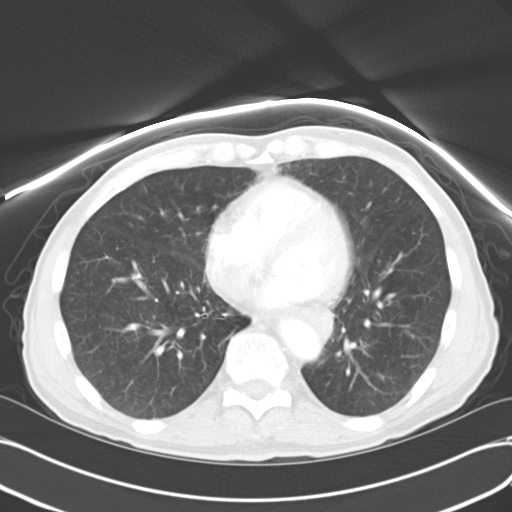
[im 31/71  lung]
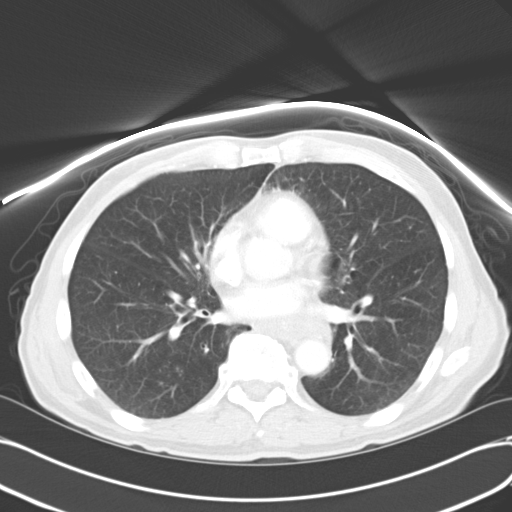
[im 41/71  lung]
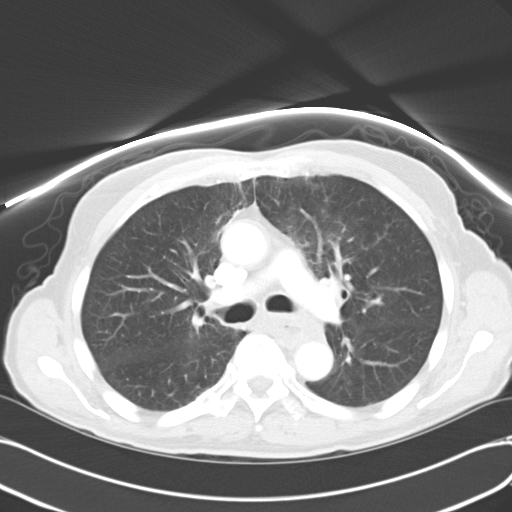
[im 46/71  lung]
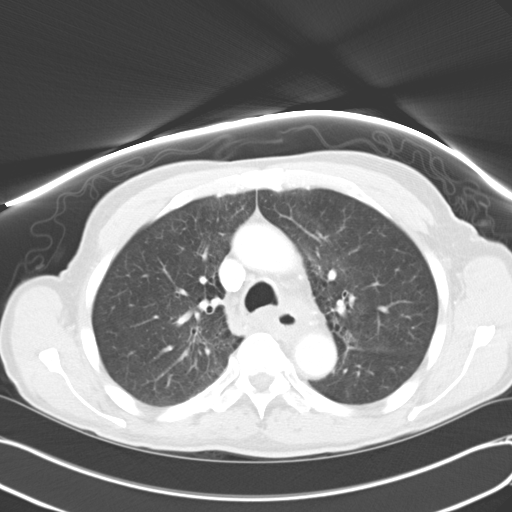
[im 51/71  mediastinal]
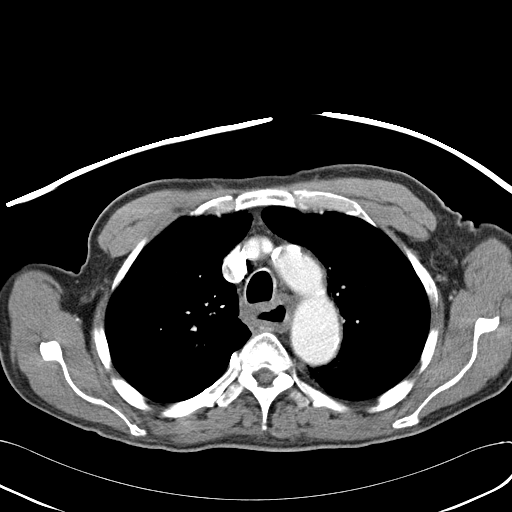
[im 51/71  lung]
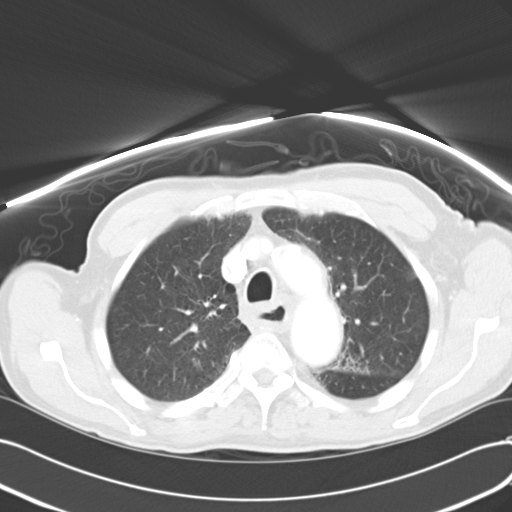
[im 56/71  lung]
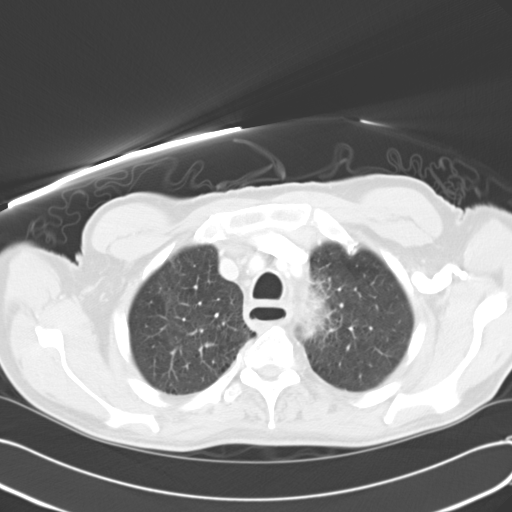
[im 61/71  lung]
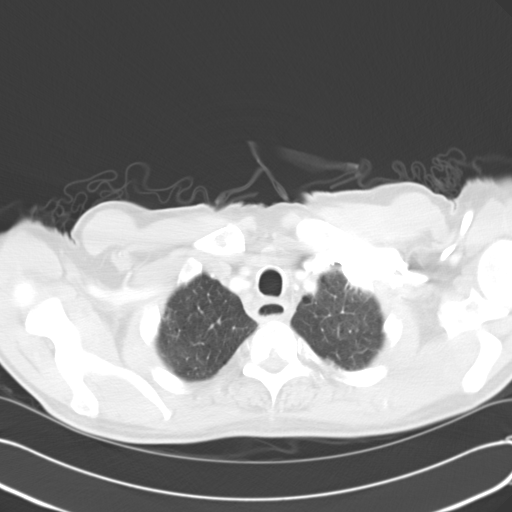
[im 66/71  lung]
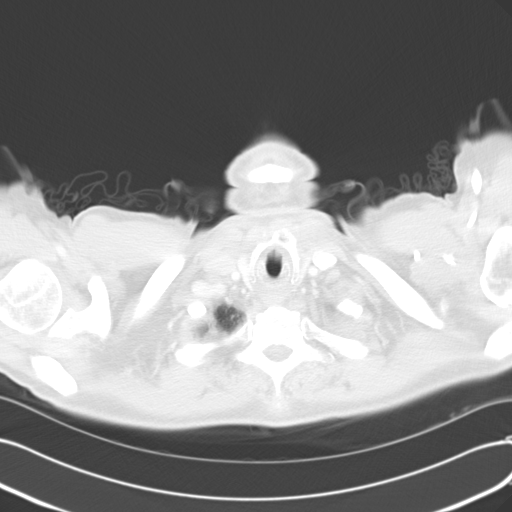

[Series 602: <mpr thick range> · coronal · 0.70mm/px · 3 of 81 slices shown]
[im 17/81  lung]
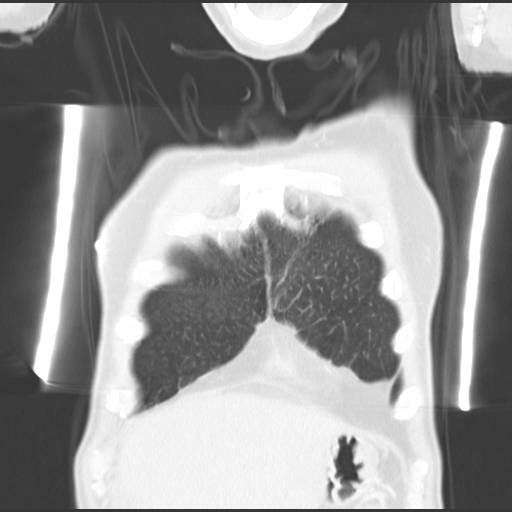
[im 33/81  lung]
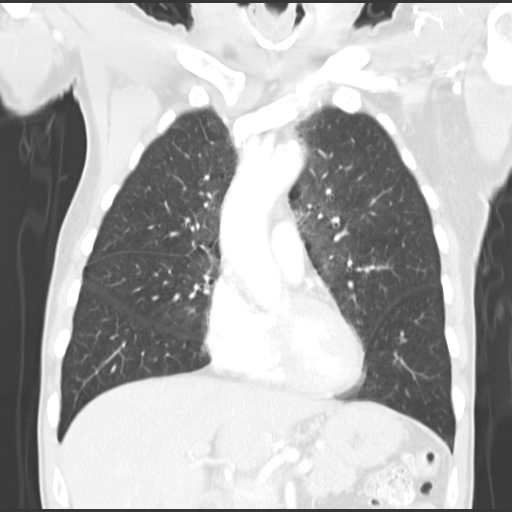
[im 49/81  lung]
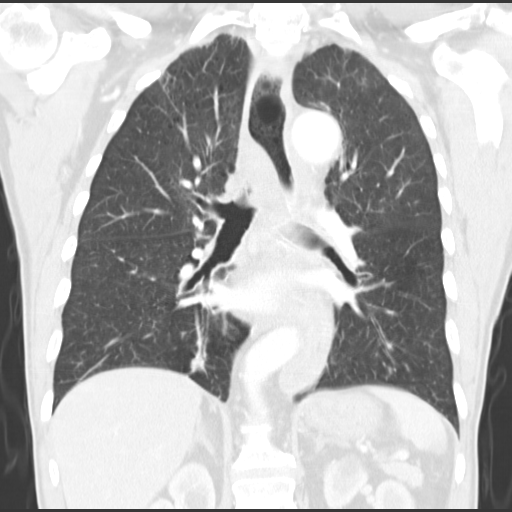

[15 of 36 positions shown; findings below may reference images not displayed]

FINDINGS: Intraluminal mass in the upper thoracic esophagus is no
longer visualized.  Decreased diffuse esophageal wall thickening is
seen compared to previous study.

Mediastinal lymph node in the high right paratracheal region on
image 12 has decreased now measuring 0.9 x 0.8 cm compared with
x 1.0 cm on prior study.  No other pathologically enlarged nodes
are seen in the mediastinum or hilar regions.  No evidence of
axillary lymphadenopathy or chest wall mass.

There is no evidence of pleural or pericardial effusion.  Mild
radiation changes are seen in the paramediastinal lung zones which
are new since previous study.  No evidence of pulmonary mass or
consolidation.  No suspicious bone lesions are identified.
IMPRESSION: 1.  Resolution of upper thoracic esophageal mass, and decreased
diffuse esophageal wall thickening since prior exam.
2.  Decreased mild high right paratracheal lymphadenopathy.
3.  No new or progressive neoplastic disease seen within the
thorax.
4.  Mild radiation pneumonitis in paramediastinal lung zones.

## 2010-10-13 NOTE — Telephone Encounter (Signed)
Left message for pt to callback office.  

## 2010-10-17 NOTE — Telephone Encounter (Signed)
Unable to leave message (no answer/machine did not pick up)

## 2010-10-18 NOTE — Telephone Encounter (Signed)
Left message for pt to callback office.  

## 2010-10-19 NOTE — Telephone Encounter (Signed)
Pt informed of MD's advisement via VM and to callback office with any questions or concerns.

## 2010-10-21 ENCOUNTER — Other Ambulatory Visit: Payer: Self-pay | Admitting: Internal Medicine

## 2010-11-24 ENCOUNTER — Other Ambulatory Visit: Payer: Self-pay | Admitting: Internal Medicine

## 2010-11-30 ENCOUNTER — Ambulatory Visit
Admission: RE | Admit: 2010-11-30 | Discharge: 2010-11-30 | Disposition: A | Discharge status: Home / Self Care | Payer: Medicare Other | Source: Ambulatory Visit | Attending: Radiation Oncology | Admitting: Radiation Oncology

## 2010-12-03 ENCOUNTER — Other Ambulatory Visit: Payer: Self-pay | Admitting: Gastroenterology

## 2010-12-14 ENCOUNTER — Other Ambulatory Visit: Payer: Self-pay | Admitting: *Deleted

## 2010-12-14 MED ORDER — OMEPRAZOLE 40 MG PO CPDR: 40.0000 mg | DELAYED_RELEASE_CAPSULE | Freq: Every day | ORAL | Status: DC

## 2010-12-25 ENCOUNTER — Other Ambulatory Visit (INDEPENDENT_AMBULATORY_CARE_PROVIDER_SITE_OTHER): Payer: Medicare Other

## 2010-12-25 DIAGNOSIS — R7309 Other abnormal glucose: Secondary | ICD-10-CM

## 2010-12-25 DIAGNOSIS — R739 Hyperglycemia, unspecified: Secondary | ICD-10-CM

## 2010-12-25 DIAGNOSIS — I10 Essential (primary) hypertension: Secondary | ICD-10-CM

## 2010-12-25 DIAGNOSIS — C154 Malignant neoplasm of middle third of esophagus: Secondary | ICD-10-CM

## 2010-12-25 DIAGNOSIS — K222 Esophageal obstruction: Secondary | ICD-10-CM

## 2010-12-25 LAB — COMPREHENSIVE METABOLIC PANEL
ALT: 16 U/L (ref 0–53)
BUN: 13 mg/dL (ref 6–23)
CO2: 29 mEq/L (ref 19–32)
Calcium: 9.5 mg/dL (ref 8.4–10.5)
Chloride: 102 mEq/L (ref 96–112)
Creatinine, Ser: 1.2 mg/dL (ref 0.4–1.5)
GFR: 73.49 mL/min (ref >60.00)

## 2010-12-25 LAB — CBC WITH DIFFERENTIAL/PLATELET
Basophils Absolute: 0 10*3/uL (ref 0.0–0.1)
Hemoglobin: 13 g/dL (ref 13.0–17.0)
Lymphocytes Relative: 23.3 % (ref 12.0–46.0)
Monocytes Relative: 14.8 % — ABNORMAL HIGH (ref 3.0–12.0)
Neutro Abs: 2.4 10*3/uL (ref 1.4–7.7)
Neutrophils Relative %: 55.3 % (ref 43.0–77.0)
Platelets: 190 10*3/uL (ref 150.0–400.0)
RDW: 15.3 % — ABNORMAL HIGH (ref 11.5–14.6)

## 2010-12-26 ENCOUNTER — Other Ambulatory Visit: Payer: Self-pay | Admitting: Internal Medicine

## 2010-12-27 ENCOUNTER — Ambulatory Visit (INDEPENDENT_AMBULATORY_CARE_PROVIDER_SITE_OTHER): Payer: Medicare Other | Admitting: Internal Medicine

## 2010-12-27 ENCOUNTER — Encounter: Payer: Self-pay | Admitting: Internal Medicine

## 2010-12-27 VITALS — BP 102/70 | HR 76 | Temp 99.4°F | Resp 16 | Wt 200.0 lb

## 2010-12-27 DIAGNOSIS — D509 Iron deficiency anemia, unspecified: Secondary | ICD-10-CM

## 2010-12-27 DIAGNOSIS — E785 Hyperlipidemia, unspecified: Secondary | ICD-10-CM

## 2010-12-27 DIAGNOSIS — J449 Chronic obstructive pulmonary disease, unspecified: Secondary | ICD-10-CM

## 2010-12-27 DIAGNOSIS — I10 Essential (primary) hypertension: Secondary | ICD-10-CM

## 2010-12-27 DIAGNOSIS — R000 Tachycardia, unspecified: Secondary | ICD-10-CM

## 2010-12-27 DIAGNOSIS — M25569 Pain in unspecified knee: Secondary | ICD-10-CM

## 2010-12-27 DIAGNOSIS — C154 Malignant neoplasm of middle third of esophagus: Secondary | ICD-10-CM

## 2010-12-27 DIAGNOSIS — R634 Abnormal weight loss: Secondary | ICD-10-CM

## 2010-12-27 DIAGNOSIS — M25562 Pain in left knee: Secondary | ICD-10-CM | POA: Insufficient documentation

## 2010-12-27 DIAGNOSIS — Z23 Encounter for immunization: Secondary | ICD-10-CM

## 2010-12-27 MED ORDER — LEVOTHYROXINE SODIUM 150 MCG PO TABS: 150.0000 ug | ORAL_TABLET | Freq: Every day | ORAL | Status: DC

## 2010-12-27 MED ORDER — CLONAZEPAM 1 MG PO TABS: 1.0000 mg | ORAL_TABLET | Freq: Two times a day (BID) | ORAL | Status: DC | PRN

## 2010-12-27 NOTE — Assessment & Plan Note (Signed)
Resolved

## 2010-12-27 NOTE — Assessment & Plan Note (Signed)
resolved 

## 2010-12-27 NOTE — Assessment & Plan Note (Signed)
Doing good now.   

## 2010-12-27 NOTE — Assessment & Plan Note (Signed)
Continue with current prescription therapy as reflected on the Med list.  

## 2010-12-27 NOTE — Progress Notes (Signed)
  Subjective:    Patient ID: Aaron [name], male    DOB: Oct 07, 1935, 75 y.o.   MRN: [id number]  HPI The patient presents for a follow-up of  chronic hypertension, chronic dyslipidemia controlled with medicines, wt loss F/u insomnia  Wt Readings from Last 3 Encounters:  12/27/10 200 lb (90.719 kg)  09/25/10 197 lb (89.359 kg)  08/03/10 196 lb (88.905 kg)        Review of Systems  Constitutional: Negative for appetite change, fatigue and unexpected weight change.  HENT: Negative for nosebleeds, congestion, sore throat, sneezing, trouble swallowing and neck pain.   Eyes: Negative for itching and visual disturbance.  Respiratory: Negative for cough.   Cardiovascular: Negative for chest pain, palpitations and leg swelling.  Gastrointestinal: Negative for nausea, diarrhea, blood in stool and abdominal distention.  Genitourinary: Negative for frequency and hematuria.  Musculoskeletal: Positive for arthralgias (L knee pain) and gait problem. Negative for back pain and joint swelling.  Skin: Negative for rash.  Neurological: Negative for dizziness, tremors, speech difficulty and weakness.  Psychiatric/Behavioral: Positive for sleep disturbance. Negative for dysphoric mood and agitation. The patient is not nervous/anxious.        Objective:   Physical Exam  Constitutional: He is oriented to person, place, and time. He appears well-developed.  HENT:  Mouth/Throat: Oropharynx is clear and moist.  Eyes: Conjunctivae are normal. Pupils are equal, round, and reactive to light.  Neck: Normal range of motion. No JVD present. No thyromegaly present.  Cardiovascular: Normal rate, regular rhythm, normal heart sounds and intact distal pulses.  Exam reveals no gallop and no friction rub.   No murmur heard. Pulmonary/Chest: Effort normal and breath sounds normal. No respiratory distress. He has no wheezes. He has no rales. He exhibits no tenderness.  Abdominal: Soft. Bowel sounds are normal. He  exhibits no distension and no mass. There is no tenderness. There is no rebound and no guarding.  Musculoskeletal: Normal range of motion. He exhibits tenderness (L knee is a little tender). He exhibits no edema.  Lymphadenopathy:    He has no cervical adenopathy.  Neurological: He is alert and oriented to person, place, and time. He has normal reflexes. No cranial nerve deficit. He exhibits normal muscle tone. Coordination normal.  Skin: Skin is warm and dry. No rash noted.  Psychiatric: He has a normal mood and affect. His behavior is normal. Judgment and thought content normal.      Lab Results  Component Value Date   WBC 4.4* 12/25/2010   HGB 13.0 12/25/2010   HCT 39.8 12/25/2010   PLT 190.0 12/25/2010   CHOL 149 05/29/2010   TRIG 88.0 05/29/2010   HDL 51.50 05/29/2010   ALT 16 12/25/2010   AST 25 12/25/2010   NA 138 12/25/2010   K 3.6 12/25/2010   CL 102 12/25/2010   CREATININE 1.2 12/25/2010   BUN 13 12/25/2010   CO2 29 12/25/2010   TSH 0.04* 12/25/2010   PSA 1.81 05/29/2010   INR 1.19 04/28/2009   HGBA1C 6.6* 12/25/2010       Assessment & Plan:

## 2010-12-27 NOTE — Assessment & Plan Note (Signed)
OTC meds 

## 2010-12-29 LAB — DIFFERENTIAL
Basophils Relative: 0 % (ref 0–1)
Eosinophils Relative: 9 % — ABNORMAL HIGH (ref 0–5)
Lymphs Abs: 0.5 10*3/uL — ABNORMAL LOW (ref 0.7–4.0)
Monocytes Relative: 3 % (ref 3–12)
Neutro Abs: 19.8 10*3/uL — ABNORMAL HIGH (ref 1.7–7.7)

## 2010-12-29 LAB — CBC
HCT: 38.9 % — ABNORMAL LOW (ref 39.0–52.0)
Hemoglobin: 12.9 g/dL — ABNORMAL LOW (ref 13.0–17.0)
RBC: 4.33 MIL/uL (ref 4.22–5.81)
WBC: 23.1 10*3/uL — ABNORMAL HIGH (ref 4.0–10.5)

## 2011-01-30 NOTE — Progress Notes (Signed)
This encounter was created in error - please disregard.

## 2011-02-22 ENCOUNTER — Telehealth: Payer: Self-pay | Admitting: Oncology

## 2011-02-22 NOTE — Telephone Encounter (Signed)
Mailed pt appt for 04/10/11 @ 9.45am r/s'd from 11/15 appt cx;d due to Epic

## 2011-02-26 ENCOUNTER — Other Ambulatory Visit (INDEPENDENT_AMBULATORY_CARE_PROVIDER_SITE_OTHER): Payer: Medicare Other

## 2011-02-26 DIAGNOSIS — R000 Tachycardia, unspecified: Secondary | ICD-10-CM

## 2011-02-26 LAB — TSH: TSH: 0.58 u[IU]/mL (ref 0.35–5.50)

## 2011-03-01 ENCOUNTER — Ambulatory Visit (INDEPENDENT_AMBULATORY_CARE_PROVIDER_SITE_OTHER): Payer: Medicare Other | Admitting: Internal Medicine

## 2011-03-01 ENCOUNTER — Telehealth: Payer: Self-pay | Admitting: Oncology

## 2011-03-01 ENCOUNTER — Encounter: Payer: Self-pay | Admitting: Internal Medicine

## 2011-03-01 VITALS — BP 108/80 | HR 80 | Temp 98.3°F | Resp 16 | Wt 206.0 lb

## 2011-03-01 DIAGNOSIS — I10 Essential (primary) hypertension: Secondary | ICD-10-CM

## 2011-03-01 DIAGNOSIS — C154 Malignant neoplasm of middle third of esophagus: Secondary | ICD-10-CM

## 2011-03-01 DIAGNOSIS — R7309 Other abnormal glucose: Secondary | ICD-10-CM

## 2011-03-01 DIAGNOSIS — E039 Hypothyroidism, unspecified: Secondary | ICD-10-CM

## 2011-03-01 DIAGNOSIS — J449 Chronic obstructive pulmonary disease, unspecified: Secondary | ICD-10-CM

## 2011-03-01 DIAGNOSIS — E785 Hyperlipidemia, unspecified: Secondary | ICD-10-CM

## 2011-03-01 MED ORDER — IPRATROPIUM-ALBUTEROL 20-100 MCG/ACT IN AERS: 2.0000 | INHALATION_SPRAY | Freq: Three times a day (TID) | RESPIRATORY_TRACT | Status: DC

## 2011-03-01 MED ORDER — IPRATROPIUM BROMIDE 0.02 % IN SOLN: 500.0000 ug | Freq: Four times a day (QID) | RESPIRATORY_TRACT | Status: DC

## 2011-03-01 NOTE — Assessment & Plan Note (Signed)
Treated. No sx's now

## 2011-03-01 NOTE — Telephone Encounter (Signed)
Pt called to r/s his jan 2013 appt from Tuesday to thursday

## 2011-03-01 NOTE — Assessment & Plan Note (Signed)
Monitoring

## 2011-03-01 NOTE — Assessment & Plan Note (Signed)
Continue with current prescription therapy as reflected on the Med list.  

## 2011-03-01 NOTE — Assessment & Plan Note (Signed)
  On diet  

## 2011-03-01 NOTE — Progress Notes (Signed)
  Subjective:    Patient ID: Aaron [name], male    DOB: Sep 07, 1935, 75 y.o.   MRN: [id number]  HPI  The patient presents for a follow-up of  chronic hypertension, chronic dyslipidemia, type 2 pre-diabetes, wt loss controlled with medicines, diet. C/o cough at times - COPD....    Review of Systems  Constitutional: Negative for appetite change, fatigue and unexpected weight change.  HENT: Negative for nosebleeds, congestion, sore throat, sneezing, trouble swallowing and neck pain.   Eyes: Negative for itching and visual disturbance.  Respiratory: Negative for cough.   Cardiovascular: Negative for chest pain, palpitations and leg swelling.  Gastrointestinal: Negative for nausea, diarrhea, blood in stool and abdominal distention.  Genitourinary: Negative for frequency and hematuria.  Musculoskeletal: Negative for back pain, joint swelling and gait problem.  Skin: Negative for rash.  Neurological: Negative for dizziness, tremors, speech difficulty and weakness.  Psychiatric/Behavioral: Negative for suicidal ideas, sleep disturbance, dysphoric mood and agitation. The patient is not nervous/anxious.    Wt Readings from Last 3 Encounters:  03/01/11 206 lb (93.441 kg)  12/27/10 200 lb (90.719 kg)  09/25/10 197 lb (89.359 kg)       Objective:   Physical Exam  Constitutional: He is oriented to person, place, and time. He appears well-developed.  HENT:  Mouth/Throat: Oropharynx is clear and moist.  Eyes: Conjunctivae are normal. Pupils are equal, round, and reactive to light.  Neck: Normal range of motion. No JVD present. No thyromegaly present.  Cardiovascular: Normal rate, regular rhythm, normal heart sounds and intact distal pulses.  Exam reveals no gallop and no friction rub.   No murmur heard. Pulmonary/Chest: Effort normal and breath sounds normal. No respiratory distress. He has no wheezes. He has no rales. He exhibits no tenderness.  Abdominal: Soft. Bowel sounds are normal. He  exhibits no distension and no mass. There is no tenderness. There is no rebound and no guarding.  Musculoskeletal: Normal range of motion. He exhibits no edema and no tenderness.  Lymphadenopathy:    He has no cervical adenopathy.  Neurological: He is alert and oriented to person, place, and time. He has normal reflexes. No cranial nerve deficit. He exhibits normal muscle tone. Coordination normal.  Skin: Skin is warm and dry. No rash noted.  Psychiatric: He has a normal mood and affect. His behavior is normal. Judgment and thought content normal.   Lab Results  Component Value Date   WBC 4.4* 12/25/2010   HGB 13.0 12/25/2010   HCT 39.8 12/25/2010   PLT 190.0 12/25/2010   GLUCOSE 111* 12/25/2010   CHOL 149 05/29/2010   TRIG 88.0 05/29/2010   HDL 51.50 05/29/2010   LDLCALC 80 05/29/2010   ALT 16 12/25/2010   AST 25 12/25/2010   NA 138 12/25/2010   K 3.6 12/25/2010   CL 102 12/25/2010   CREATININE 1.2 12/25/2010   BUN 13 12/25/2010   CO2 29 12/25/2010   TSH 0.58 02/26/2011   PSA 1.81 05/29/2010   INR 1.19 04/28/2009   HGBA1C 6.6* 12/25/2010   I personally provided the inhaler use teaching. After the teaching patient was able to demonstrate it's use effectively. All questions were answered         Assessment & Plan:

## 2011-03-01 NOTE — Assessment & Plan Note (Signed)
Controlled on current dose Continue with current prescription therapy as reflected on the Med list.

## 2011-03-26 ENCOUNTER — Other Ambulatory Visit: Payer: Self-pay | Admitting: Internal Medicine

## 2011-04-10 ENCOUNTER — Ambulatory Visit: Payer: Medicare Other | Admitting: Nurse Practitioner

## 2011-04-12 ENCOUNTER — Ambulatory Visit (HOSPITAL_BASED_OUTPATIENT_CLINIC_OR_DEPARTMENT_OTHER): Payer: Medicare Other | Admitting: Nurse Practitioner

## 2011-04-12 ENCOUNTER — Telehealth: Payer: Self-pay | Admitting: Oncology

## 2011-04-12 VITALS — BP 116/73 | HR 79 | Temp 96.9°F | Ht 74.0 in | Wt 206.7 lb

## 2011-04-12 DIAGNOSIS — C159 Malignant neoplasm of esophagus, unspecified: Secondary | ICD-10-CM

## 2011-04-12 DIAGNOSIS — I10 Essential (primary) hypertension: Secondary | ICD-10-CM | POA: Diagnosis not present

## 2011-04-12 DIAGNOSIS — C154 Malignant neoplasm of middle third of esophagus: Secondary | ICD-10-CM

## 2011-04-12 NOTE — Progress Notes (Signed)
OFFICE PROGRESS NOTE  Interval history:  Mr. Doughtie returns as scheduled. He feels well. He has no complaints. No interim illnesses or infections. He has a good appetite and good energy level. He denies pain. No dysphagia. No nausea or vomiting. Bowels moving regularly. No shortness of breath or cough.   Objective: Blood pressure 116/73, pulse 79, temperature 96.9 F (36.1 C), temperature source Oral, height 6\' 2"  (1.88 m), weight 206 lb 11.2 oz (93.759 kg).  Oropharynx is without thrush or ulceration. No palpable cervical, supraclavicular, axillary or inguinal lymph nodes. Lungs are clear. No wheezes or rales. Regular cardiac rhythm. Abdomen is soft and nontender. No organomegaly. Extremities are without edema.  Lab Results: Lab Results  Component Value Date   WBC 4.4* 12/25/2010   HGB 13.0 12/25/2010   HCT 39.8 12/25/2010   MCV 89.2 12/25/2010   PLT 190.0 12/25/2010    Chemistry:    Chemistry      Component Value Date/Time   NA 138 12/25/2010 0809   K 3.6 12/25/2010 0809   CL 102 12/25/2010 0809   CO2 29 12/25/2010 0809   BUN 13 12/25/2010 0809   CREATININE 1.2 12/25/2010 0809      Component Value Date/Time   CALCIUM 9.5 12/25/2010 0809   ALKPHOS 95 12/25/2010 0809   AST 25 12/25/2010 0809   ALT 16 12/25/2010 0809   BILITOT 0.6 12/25/2010 0809       Studies/Results: No results found.  Medications: I have reviewed the patient's current medications.  Assessment/Plan:  1. Squamous cell carcinoma of the esophagus, status post an endoscopic biopsy, 04/07/2009, with the pathology confirming at least superficial invasion. 2. PET scan, 04/20/2009, confirmed hypermetabolic activity associated with the upper and thoracic esophagus mass with a single hypermetabolic right paratracheal lymph node.  He began concurrent radiation and weekly Taxol/carboplatin chemotherapy 05/03/2009.  The last treatment with Taxol/carboplatin was given 06/14/2009.  He completed radiation 06/16/2009.     3. Solid/liquid dysphagia secondary to the proximal esophageal mass, resolved.  He is status post an esophageal dilatation for management of a stricture 08/24/2009. 4. Borderline enlarged mediastinal and gastrohepatic lymph nodes on staging CT scans.  A PET scan 04/20/2009 confirmed hypermetabolic activity associated with the right paratracheal lymph node. 5. Hypertension. 6. Status post placement of a feeding jejunostomy tube by Dr. Michaell Cowing, the jejunostomy tube has been removed. 7. Admission with neutropenia and anemia, March 2011.    8. Fever of unknown origin, April 2011, resolved.  He was treated with prednisone beginning on 07/13/2009 for presumed radiation pneumonitis.  He developed episodes of rigors on prednisone, and the prednisone was discontinued, 07/15/2009.  A CT scan did not reveal evidence for an infectious process.  Disposition-Mr. Vieth appears well. He remains in clinical remission from the esophagus cancer. He will return for a followup visit in 6 months. He will contact the office in the interim with any problems.  Plan reviewed with Dr. Truett Perna.  Lonna Cobb ANP/GNP-BC

## 2011-04-12 NOTE — Telephone Encounter (Signed)
appt made and printed for 7/18   aom

## 2011-05-15 ENCOUNTER — Other Ambulatory Visit: Payer: Self-pay | Admitting: Internal Medicine

## 2011-05-25 ENCOUNTER — Encounter: Payer: Self-pay | Admitting: *Deleted

## 2011-05-25 DIAGNOSIS — C159 Malignant neoplasm of esophagus, unspecified: Secondary | ICD-10-CM | POA: Insufficient documentation

## 2011-05-25 DIAGNOSIS — I499 Cardiac arrhythmia, unspecified: Secondary | ICD-10-CM | POA: Insufficient documentation

## 2011-05-31 ENCOUNTER — Encounter: Payer: Self-pay | Admitting: Radiation Oncology

## 2011-05-31 ENCOUNTER — Ambulatory Visit
Admission: RE | Admit: 2011-05-31 | Discharge: 2011-05-31 | Disposition: A | Discharge status: Home / Self Care | Payer: Medicare Other | Source: Ambulatory Visit | Attending: Radiation Oncology | Admitting: Radiation Oncology

## 2011-05-31 VITALS — BP 97/65 | HR 96 | Temp 97.6°F | Resp 20 | Wt 207.0 lb

## 2011-05-31 DIAGNOSIS — C154 Malignant neoplasm of middle third of esophagus: Secondary | ICD-10-CM

## 2011-05-31 NOTE — Progress Notes (Signed)
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  He denies swallowing trouble.                              ALLERGIES:  is allergic to amoxicillin; cefuroxime axetil; and fluconazole.  Meds: Current Outpatient Prescriptions  Medication Sig Dispense Refill  . aspirin 81 MG EC tablet Take 81 mg by mouth daily.        . Cholecalciferol 1000 UNITS tablet Take 1,000 Units by mouth daily.        . clonazePAM (KLONOPIN) 1 MG tablet Take 1 tablet (1 mg total) by mouth 2 (two) times daily as needed for anxiety. Can use for shaking, anxiety and for insomnia. Can take 1/2 or 1 tablet each time.  60 tablet  5  . fluticasone (FLONASE) 50 MCG/ACT nasal spray 2 sprays by Nasal route daily.        Marland Kitchen ipratropium (ATROVENT) 0.02 % nebulizer solution Take 2.5 mLs (500 mcg total) by nebulization 4 (four) times daily.  75 mL  12  . Ipratropium-Albuterol (COMBIVENT RESPIMAT) 20-100 MCG/ACT AERS Inhale 2 Act into the lungs 4 (four) times daily - after meals and at bedtime.  1 Inhaler  3  . levothyroxine (SYNTHROID) 150 MCG tablet Take 1 tablet (150 mcg total) by mouth daily.  30 tablet  11  . loratadine (CLARITIN) 10 MG tablet Take 10 mg by mouth daily as needed.        . Mometasone Furo-Formoterol Fum (DULERA) 200-5 MCG/ACT AERO Inhale 2 puffs into the lungs as needed.       Marland Kitchen omeprazole (PRILOSEC) 40 MG capsule TAKE ONE CAPSULE BY MOUTH DAILY  90 capsule  1  . simvastatin (ZOCOR) 20 MG tablet TAKE 1 TABLET BY MOUTH DAILY FOR CHOLESTEROL  30 tablet  5  . docusate sodium  (COLACE) 100 MG capsule Take 100 mg by mouth as needed.        . Omega-3 Fatty Acids (FISH OIL) 1000 MG CAPS Take 1 capsule by mouth daily.          Physical Findings: The patient is in no acute distress. Patient is alert and oriented.  weight is 207 lb (93.895 kg). His oral temperature is 97.6 F (36.4 C). His blood pressure is 97/65 and his pulse is 96. His respiration is 20. .  As you your region is free of adenopathy. Lungs are clear throughout heart sounds are distant but regular abdomen is soft and nontender. There is no hepatomegaly noted. Extremities are free of cyanosis clubbing or edema. Motor strength is intact nipple extremities. Speech is fluent. Gait is unremarkable.  Lab Findings: Lab Results  Component Value Date   WBC 4.4* 12/25/2010   HGB 13.0 12/25/2010   HCT 39.8 12/25/2010   MCV 89.2 12/25/2010   PLT 190.0 12/25/2010     Radiographic Findings: No results found.  Impression:  The patient has no evidence of recurrence and no significant dysphagia, after dilatation.  Plan:  Follow-up in one year.  _____________________________________  Sheral Apley Tammi Klippel, M.D.

## 2011-05-31 NOTE — Progress Notes (Signed)
Pt alert and oriented x 3, steady gait, eating and drinking well, vitals=97.6,b/p=97/65,p=96-rr=20, 97% room air sats, no c/o pain or discomfort, no difficulty swallowing, stated patient, did stop taking fish oil 2 months ago, sees primary Md next week,

## 2011-06-05 ENCOUNTER — Other Ambulatory Visit (INDEPENDENT_AMBULATORY_CARE_PROVIDER_SITE_OTHER): Payer: Medicare Other

## 2011-06-05 DIAGNOSIS — E785 Hyperlipidemia, unspecified: Secondary | ICD-10-CM | POA: Diagnosis not present

## 2011-06-05 DIAGNOSIS — C154 Malignant neoplasm of middle third of esophagus: Secondary | ICD-10-CM

## 2011-06-05 DIAGNOSIS — E039 Hypothyroidism, unspecified: Secondary | ICD-10-CM | POA: Diagnosis not present

## 2011-06-05 DIAGNOSIS — I10 Essential (primary) hypertension: Secondary | ICD-10-CM

## 2011-06-05 DIAGNOSIS — R7309 Other abnormal glucose: Secondary | ICD-10-CM | POA: Diagnosis not present

## 2011-06-05 LAB — CBC WITH DIFFERENTIAL/PLATELET
Basophils Absolute: 0 10*3/uL (ref 0.0–0.1)
Basophils Relative: 0.5 % (ref 0.0–3.0)
Eosinophils Absolute: 0.2 10*3/uL (ref 0.0–0.7)
Lymphocytes Relative: 24.6 % (ref 12.0–46.0)
MCHC: 33.3 g/dL (ref 30.0–36.0)
Monocytes Absolute: 0.6 10*3/uL (ref 0.1–1.0)
Neutrophils Relative %: 56.2 % (ref 43.0–77.0)
Platelets: 238 10*3/uL (ref 150.0–400.0)
RBC: 4.3 Mil/uL (ref 4.22–5.81)
WBC: 4.4 10*3/uL — ABNORMAL LOW (ref 4.5–10.5)

## 2011-06-05 LAB — BASIC METABOLIC PANEL
BUN: 15 mg/dL (ref 6–23)
CO2: 29 mEq/L (ref 19–32)
Calcium: 9.5 mg/dL (ref 8.4–10.5)
Creatinine, Ser: 1.6 mg/dL — ABNORMAL HIGH (ref 0.4–1.5)

## 2011-06-05 LAB — TSH: TSH: 21.71 u[IU]/mL — ABNORMAL HIGH (ref 0.35–5.50)

## 2011-06-07 ENCOUNTER — Encounter: Payer: Self-pay | Admitting: Internal Medicine

## 2011-06-07 ENCOUNTER — Ambulatory Visit (INDEPENDENT_AMBULATORY_CARE_PROVIDER_SITE_OTHER): Payer: Medicare Other | Admitting: Internal Medicine

## 2011-06-07 VITALS — BP 120/70 | HR 80 | Temp 99.1°F | Resp 16 | Wt 206.0 lb

## 2011-06-07 DIAGNOSIS — E785 Hyperlipidemia, unspecified: Secondary | ICD-10-CM | POA: Diagnosis not present

## 2011-06-07 DIAGNOSIS — I10 Essential (primary) hypertension: Secondary | ICD-10-CM

## 2011-06-07 DIAGNOSIS — E039 Hypothyroidism, unspecified: Secondary | ICD-10-CM | POA: Diagnosis not present

## 2011-06-07 DIAGNOSIS — C159 Malignant neoplasm of esophagus, unspecified: Secondary | ICD-10-CM

## 2011-06-07 DIAGNOSIS — J449 Chronic obstructive pulmonary disease, unspecified: Secondary | ICD-10-CM | POA: Diagnosis not present

## 2011-06-07 MED ORDER — LEVOTHYROXINE SODIUM 175 MCG PO TABS: 175.0000 ug | ORAL_TABLET | Freq: Every day | ORAL | Status: DC

## 2011-06-07 MED ORDER — ACLIDINIUM BROMIDE 400 MCG/ACT IN AEPB: 1.0000 | INHALATION_SPRAY | Freq: Two times a day (BID) | RESPIRATORY_TRACT | Status: DC

## 2011-06-07 NOTE — Assessment & Plan Note (Signed)
Continue with current prescription therapy prn as reflected on the Med list.  

## 2011-06-07 NOTE — Assessment & Plan Note (Addendum)
He was taking 1/2 tab a day for some reason... Increase the dose

## 2011-06-07 NOTE — Assessment & Plan Note (Signed)
Jan 2011: squamous cell ca S/p XRT x25 and chemo

## 2011-06-07 NOTE — Assessment & Plan Note (Signed)
Fish oil liquid

## 2011-06-07 NOTE — Assessment & Plan Note (Signed)
Continue with current prescription therapy as reflected on the Med list.  

## 2011-06-07 NOTE — Progress Notes (Signed)
Patient ID: Aaron [name], male   DOB: 03-12-1936, 76 y.o.   MRN: [id number]  Subjective:    Patient ID: Aaron [name], male    DOB: 11-17-1935, 76 y.o.   MRN: [id number]  HPI  The patient presents for a follow-up of  chronic hypertension, chronic dyslipidemia, type 2 pre-diabetes, wt loss controlled with medicines, diet. C/o cough at times - COPD....  He choked on a fish oil caps   Review of Systems  Constitutional: Negative for appetite change, fatigue and unexpected weight change.  HENT: Negative for nosebleeds, congestion, sore throat, sneezing, trouble swallowing and neck pain.   Eyes: Negative for itching and visual disturbance.  Respiratory: Negative for cough.   Cardiovascular: Negative for chest pain, palpitations and leg swelling.  Gastrointestinal: Negative for nausea, diarrhea, blood in stool and abdominal distention.  Genitourinary: Negative for frequency and hematuria.  Musculoskeletal: Negative for back pain, joint swelling and gait problem.  Skin: Negative for rash.  Neurological: Negative for dizziness, tremors, speech difficulty and weakness.  Psychiatric/Behavioral: Negative for suicidal ideas, sleep disturbance, dysphoric mood and agitation. The patient is not nervous/anxious.    Wt Readings from Last 3 Encounters:  06/07/11 206 lb (93.441 kg)  05/31/11 207 lb (93.895 kg)  04/12/11 206 lb 11.2 oz (93.759 kg)   BP Readings from Last 3 Encounters:  06/07/11 120/70  05/31/11 97/65  04/12/11 116/73        Objective:   Physical Exam  Constitutional: He is oriented to person, place, and time. He appears well-developed.  HENT:  Mouth/Throat: Oropharynx is clear and moist.  Eyes: Conjunctivae are normal. Pupils are equal, round, and reactive to light.  Neck: Normal range of motion. No JVD present. No thyromegaly present.  Cardiovascular: Normal rate, regular rhythm, normal heart sounds and intact distal pulses.  Exam reveals no gallop and no friction rub.     No murmur heard. Pulmonary/Chest: Effort normal and breath sounds normal. No respiratory distress. He has no wheezes. He has no rales. He exhibits no tenderness.  Abdominal: Soft. Bowel sounds are normal. He exhibits no distension and no mass. There is no tenderness. There is no rebound and no guarding.  Musculoskeletal: Normal range of motion. He exhibits no edema and no tenderness.  Lymphadenopathy:    He has no cervical adenopathy.  Neurological: He is alert and oriented to person, place, and time. He has normal reflexes. No cranial nerve deficit. He exhibits normal muscle tone. Coordination normal.  Skin: Skin is warm and dry. No rash noted.  Psychiatric: He has a normal mood and affect. His behavior is normal. Judgment and thought content normal.   Lab Results  Component Value Date   WBC 4.4* 06/05/2011   HGB 12.9* 06/05/2011   HCT 38.6* 06/05/2011   PLT 238.0 06/05/2011   GLUCOSE 109* 06/05/2011   CHOL 149 05/29/2010   TRIG 88.0 05/29/2010   HDL 51.50 05/29/2010   LDLCALC 80 05/29/2010   ALT 16 12/25/2010   AST 25 12/25/2010   NA 136 06/05/2011   K 3.8 06/05/2011   CL 100 06/05/2011   CREATININE 1.6* 06/05/2011   BUN 15 06/05/2011   CO2 29 06/05/2011   TSH 21.71* 06/05/2011   PSA 1.81 05/29/2010   INR 1.19 04/28/2009   HGBA1C 6.6* 12/25/2010            Assessment & Plan:

## 2011-07-24 ENCOUNTER — Other Ambulatory Visit: Payer: Self-pay | Admitting: Internal Medicine

## 2011-07-24 NOTE — Telephone Encounter (Signed)
Pt has f/u 08/2011, please advise # of refills:  clonazePAM (KLONOPIN) 1 MG tablet [Pharmacy Med Name: CLONAZEPAM 1MG  TABLETS] TAKE 1/2 TO 1 TABLET BY MOUTH TWICE DAILY AS NEEDED FOR ANXIETY CAN USE FOR SHAKING, ANXIETY, AND INSOMNIA Disp: 60 tablet R: 0 Start: 07/24/2011 Class: Normal Requested on: 07/24/2011 Originally ordered on: 12/27/2010 Last refill: 06/08/2011

## 2011-07-31 NOTE — Telephone Encounter (Signed)
Pt called again requesting refills of Klonopin. Pt will be out tomorrow, please advise.

## 2011-08-01 ENCOUNTER — Other Ambulatory Visit: Payer: Self-pay | Admitting: Internal Medicine

## 2011-08-01 ENCOUNTER — Telehealth: Payer: Self-pay | Admitting: *Deleted

## 2011-08-01 MED ORDER — CLONAZEPAM 1 MG PO TABS: 1.0000 mg | ORAL_TABLET | Freq: Every day | ORAL | Status: DC

## 2011-08-01 NOTE — Telephone Encounter (Signed)
Rf req for Clonazepam 1 mg 1 po qhs. Ok to Rf?

## 2011-08-01 NOTE — Telephone Encounter (Signed)
PA for Aaron [name] is approved until 03/25/12. Pharmacy informed. I called pt- spoke to a male who states he is at work and to call back after 2 pm.

## 2011-08-01 NOTE — Telephone Encounter (Signed)
Pt informed

## 2011-08-01 NOTE — Telephone Encounter (Signed)
Done

## 2011-08-01 NOTE — Telephone Encounter (Signed)
OK to fill this prescription with additional refills x3 Thank you!  

## 2011-08-02 NOTE — Telephone Encounter (Signed)
Pt last seen 06/07/11 and has appt on 09/06/11. Clonazepam last filled on 06/08/11. Please advise if ok to refill and if so how many times?

## 2011-08-14 ENCOUNTER — Encounter: Payer: Self-pay | Admitting: Gastroenterology

## 2011-08-30 ENCOUNTER — Other Ambulatory Visit (INDEPENDENT_AMBULATORY_CARE_PROVIDER_SITE_OTHER): Payer: Medicare Other

## 2011-08-30 DIAGNOSIS — E039 Hypothyroidism, unspecified: Secondary | ICD-10-CM | POA: Diagnosis not present

## 2011-08-30 LAB — TSH: TSH: 0.05 u[IU]/mL — ABNORMAL LOW (ref 0.35–5.50)

## 2011-09-06 ENCOUNTER — Encounter: Payer: Self-pay | Admitting: Internal Medicine

## 2011-09-06 ENCOUNTER — Ambulatory Visit (INDEPENDENT_AMBULATORY_CARE_PROVIDER_SITE_OTHER): Payer: Medicare Other | Admitting: Internal Medicine

## 2011-09-06 VITALS — BP 120/70 | HR 72 | Temp 98.2°F | Resp 16 | Wt 206.0 lb

## 2011-09-06 DIAGNOSIS — E785 Hyperlipidemia, unspecified: Secondary | ICD-10-CM

## 2011-09-06 DIAGNOSIS — C154 Malignant neoplasm of middle third of esophagus: Secondary | ICD-10-CM

## 2011-09-06 DIAGNOSIS — I10 Essential (primary) hypertension: Secondary | ICD-10-CM

## 2011-09-06 DIAGNOSIS — E039 Hypothyroidism, unspecified: Secondary | ICD-10-CM

## 2011-09-06 DIAGNOSIS — J449 Chronic obstructive pulmonary disease, unspecified: Secondary | ICD-10-CM

## 2011-09-06 DIAGNOSIS — C159 Malignant neoplasm of esophagus, unspecified: Secondary | ICD-10-CM | POA: Diagnosis not present

## 2011-09-06 DIAGNOSIS — J4489 Other specified chronic obstructive pulmonary disease: Secondary | ICD-10-CM

## 2011-09-06 NOTE — Assessment & Plan Note (Signed)
Regular f/u w/Oncology

## 2011-09-06 NOTE — Progress Notes (Signed)
Patient ID: Ashyr Hedgepath, male   DOB: [birth year], 76 y.o.   MRN: [id number] Patient ID: Gurney Balthazor, male   DOB: 12-22-1935, 76 y.o.   MRN: [id number]  Subjective:    Patient ID: Derral Colucci, male    DOB: 10/31/1935, 76 y.o.   MRN: [id number]  HPI  The patient presents for a follow-up of  chronic hypertension, chronic dyslipidemia, type 2 pre-diabetes, wt loss controlled with medicines, diet. C/o cough at times - COPD.... No dysphagia     Review of Systems  Constitutional: Negative for appetite change, fatigue and unexpected weight change.  HENT: Negative for nosebleeds, congestion, sore throat, sneezing, trouble swallowing and neck pain.   Eyes: Negative for itching and visual disturbance.  Respiratory: Negative for cough.   Cardiovascular: Negative for chest pain, palpitations and leg swelling.  Gastrointestinal: Negative for nausea, diarrhea, blood in stool and abdominal distention.  Genitourinary: Negative for frequency and hematuria.  Musculoskeletal: Negative for back pain, joint swelling and gait problem.  Skin: Negative for rash.  Neurological: Negative for dizziness, tremors, speech difficulty and weakness.  Psychiatric/Behavioral: Negative for suicidal ideas, disturbed wake/sleep cycle, dysphoric mood and agitation. The patient is not nervous/anxious.    Wt Readings from Last 3 Encounters:  09/06/11 206 lb (93.441 kg)  06/07/11 206 lb (93.441 kg)  05/31/11 207 lb (93.895 kg)   BP Readings from Last 3 Encounters:  09/06/11 120/70  06/07/11 120/70  05/31/11 97/65        Objective:   Physical Exam  Constitutional: He is oriented to person, place, and time. He appears well-developed.  HENT:  Mouth/Throat: Oropharynx is clear and moist.  Eyes: Conjunctivae are normal. Pupils are equal, round, and reactive to light.  Neck: Normal range of motion. No JVD present. No thyromegaly present.  Cardiovascular: Normal rate, regular rhythm, normal heart sounds and intact  distal pulses.  Exam reveals no gallop and no friction rub.   No murmur heard. Pulmonary/Chest: Effort normal and breath sounds normal. No respiratory distress. He has no wheezes. He has no rales. He exhibits no tenderness.  Abdominal: Soft. Bowel sounds are normal. He exhibits no distension and no mass. There is no tenderness. There is no rebound and no guarding.  Musculoskeletal: Normal range of motion. He exhibits no edema and no tenderness.  Lymphadenopathy:    He has no cervical adenopathy.  Neurological: He is alert and oriented to person, place, and time. He has normal reflexes. No cranial nerve deficit. He exhibits normal muscle tone. Coordination normal.  Skin: Skin is warm and dry. No rash noted.  Psychiatric: He has a normal mood and affect. His behavior is normal. Judgment and thought content normal.   Lab Results  Component Value Date   WBC 4.4* 06/05/2011   HGB 12.9* 06/05/2011   HCT 38.6* 06/05/2011   PLT 238.0 06/05/2011   GLUCOSE 109* 06/05/2011   CHOL 149 05/29/2010   TRIG 88.0 05/29/2010   HDL 51.50 05/29/2010   LDLCALC 80 05/29/2010   ALT 16 12/25/2010   AST 25 12/25/2010   NA 136 06/05/2011   K 3.8 06/05/2011   CL 100 06/05/2011   CREATININE 1.6* 06/05/2011   BUN 15 06/05/2011   CO2 29 06/05/2011   TSH 0.05* 08/30/2011   PSA 1.81 05/29/2010   INR 1.19 04/28/2009   HGBA1C 6.6* 12/25/2010            Assessment & Plan:

## 2011-09-06 NOTE — Assessment & Plan Note (Signed)
Continue with current prescription therapy as reflected on the Med list.  

## 2011-09-06 NOTE — Assessment & Plan Note (Signed)
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

## 2011-09-06 NOTE — Assessment & Plan Note (Signed)
Better - recheck TSH in 3 mo

## 2011-09-21 ENCOUNTER — Encounter (HOSPITAL_COMMUNITY): Payer: Self-pay | Admitting: *Deleted

## 2011-09-21 ENCOUNTER — Other Ambulatory Visit: Payer: Self-pay | Admitting: Internal Medicine

## 2011-09-21 ENCOUNTER — Emergency Department (HOSPITAL_COMMUNITY)
Admission: EM | Admit: 2011-09-21 | Discharge: 2011-09-22 | Disposition: A | Discharge status: Home / Self Care | Payer: Medicare Other | Attending: Emergency Medicine | Admitting: Emergency Medicine

## 2011-09-21 DIAGNOSIS — Z923 Personal history of irradiation: Secondary | ICD-10-CM | POA: Diagnosis not present

## 2011-09-21 DIAGNOSIS — Z87891 Personal history of nicotine dependence: Secondary | ICD-10-CM | POA: Insufficient documentation

## 2011-09-21 DIAGNOSIS — I10 Essential (primary) hypertension: Secondary | ICD-10-CM | POA: Insufficient documentation

## 2011-09-21 DIAGNOSIS — R5381 Other malaise: Secondary | ICD-10-CM | POA: Insufficient documentation

## 2011-09-21 DIAGNOSIS — R6889 Other general symptoms and signs: Secondary | ICD-10-CM | POA: Diagnosis not present

## 2011-09-21 DIAGNOSIS — E038 Other specified hypothyroidism: Secondary | ICD-10-CM | POA: Diagnosis not present

## 2011-09-21 DIAGNOSIS — R066 Hiccough: Secondary | ICD-10-CM

## 2011-09-21 DIAGNOSIS — J449 Chronic obstructive pulmonary disease, unspecified: Secondary | ICD-10-CM | POA: Diagnosis not present

## 2011-09-21 DIAGNOSIS — Z79899 Other long term (current) drug therapy: Secondary | ICD-10-CM | POA: Insufficient documentation

## 2011-09-21 DIAGNOSIS — Z7982 Long term (current) use of aspirin: Secondary | ICD-10-CM | POA: Insufficient documentation

## 2011-09-21 DIAGNOSIS — J4489 Other specified chronic obstructive pulmonary disease: Secondary | ICD-10-CM | POA: Insufficient documentation

## 2011-09-21 LAB — GLUCOSE, CAPILLARY: Glucose-Capillary: 152 mg/dL — ABNORMAL HIGH (ref 70–99)

## 2011-09-21 MED ORDER — CHLORPROMAZINE HCL 25 MG PO TABS: 25.0000 mg | ORAL_TABLET | Freq: Three times a day (TID) | ORAL | Status: DC

## 2011-09-21 MED ORDER — CHLORPROMAZINE HCL 25 MG PO TABS
25.0000 mg | ORAL_TABLET | Freq: Once | ORAL | Status: AC
  Administered 2011-09-21: 25 mg via ORAL
  Filled 2011-09-21: qty 1

## 2011-09-21 NOTE — ED Provider Notes (Addendum)
History     CSN: [id number]  Arrival date & time 09/21/11  1840   First MD Initiated Contact with Patient 09/21/11 2201      Chief Complaint  Patient presents with  . Weakness  . hiccups     (Consider location/radiation/quality/duration/timing/severity/associated sxs/prior treatment) HPI Comments: The patient complains of hiccoughs constantly for 3 days. No shortness of breath, chest pain, N, V or abdominal pain. He has no difficulty swallowing. He reports increased salivation. He has a history of esophageal cancer that was successfully treated with radiation 2 years ago. Last routine appointment with oncology was one year ago.  The history is provided by the patient and the spouse.    Past Medical History  Diagnosis Date  . Diverticulosis of colon   . Gastritis   . Hemorrhoids   . GERD (gastroesophageal reflux disease)   . Hiatal hernia   . GERD (gastroesophageal reflux disease)   . Hyperlipidemia     Dr Tresa Endo  . HTN (hypertension)   . Elevated glucose   . COPD (chronic obstructive pulmonary disease)   . Esophageal cancer 2011    squamous cell ca  . History of radiation therapy 05/03/09-06/16/09    squamous cell ca of esophagus  . Thyroid disease     hypthyroidism s/p radiotherapy  . Arrhythmia   . Allergic rhinitis     Past Surgical History  Procedure Date  . Lower back cyst removed     [facility]  . Creation of cutaneous stoma 2011  . Tonsillectomy     age 76    Family History  Problem Relation Age of Onset  . Stroke Maternal Grandfather 35  . Prostate cancer Father     64's  . Diabetes Father   . Prostate cancer Brother 6  . Prostate cancer Brother 48  . Coronary artery disease Neg Hx   . Colon cancer Neg Hx   . Hypertension Neg Hx   . Stroke Mother     aneurism    History  Substance Use Topics  . Smoking status: Former Smoker -- 1.0 packs/day for 40 years  . Smokeless tobacco: Not on file   Comment: Quit 2002  . Alcohol Use: Yes     Social        Review of Systems  Constitutional: Negative for fever.  HENT:       See HPI.  Respiratory: Negative for cough and shortness of breath.   Cardiovascular: Negative for chest pain.  Gastrointestinal: Negative for nausea and abdominal pain.    Allergies  Amoxicillin; Cefuroxime axetil; and Fluconazole  Home Medications   Current Outpatient Rx  Name Route Sig Dispense Refill  . ACLIDINIUM BROMIDE 400 MCG/ACT IN AEPB Inhalation Inhale 1 Act into the lungs 2 (two) times daily.    . ASPIRIN 81 MG PO TBEC Oral Take 81 mg by mouth daily.      Marland Kitchen CALCIUM CARBONATE ANTACID 500 MG PO CHEW Oral Chew 2 tablets by mouth 2 (two) times daily as needed. Acid reflux/gas    . CHOLECALCIFEROL 1000 UNITS PO TABS Oral Take 1,000 Units by mouth daily.      Marland Kitchen CLONAZEPAM 1 MG PO TABS Oral Take 0.5-1 mg by mouth 3 (three) times daily as needed. Takes 1/2 tablet prn anxiety and 1 tablet at night    . IPRATROPIUM BROMIDE 0.02 % IN SOLN Nebulization Take 500 mcg by nebulization 2 (two) times daily.    Marland Kitchen LEVOTHYROXINE SODIUM 175 MCG PO TABS Oral  Take 175 mcg by mouth daily.    Marland Kitchen LORATADINE 10 MG PO TABS Oral Take 10 mg by mouth daily.     Marland Kitchen OMEPRAZOLE 40 MG PO CPDR Oral Take 40 mg by mouth daily.    Marland Kitchen SIMVASTATIN 20 MG PO TABS Oral Take 20 mg by mouth every evening.      BP 137/81  Pulse 99  Temp 98.8 F (37.1 C) (Oral)  Resp 18  SpO2 97%  Physical Exam  Constitutional: He is oriented to person, place, and time. He appears well-developed and well-nourished. No distress.       Actively hiccoughing.  HENT:  Head: Normocephalic.  Mouth/Throat: Oropharynx is clear and moist.  Neck: Normal range of motion. Neck supple. No thyromegaly present.  Cardiovascular: Normal rate and regular rhythm.   No murmur heard. Pulmonary/Chest: Effort normal. He has no wheezes. He has no rales.  Abdominal: Soft. There is no tenderness.  Neurological: He is alert and oriented to person, place, and time.  Skin: Skin  is warm and dry.    ED Course  Procedures (including critical care time)  Labs Reviewed  GLUCOSE, CAPILLARY - Abnormal; Notable for the following:    Glucose-Capillary 152 (*)     All other components within normal limits   No results found.  Date: 12/22/2011  Rate: 103  Rhythm: sinus tachycardia  QRS Axis: normal  Intervals: normal  ST/T Wave abnormalities: normal  Conduction Disutrbances:none  Narrative Interpretation:   Old EKG Reviewed: unchanged    No diagnosis found. 1. Hiccoughs    MDM  Thorazine with improvement. Discussed need for follow up with Dr. Truett Perna on Monday to discuss the possibility that recurrence of esophageal cancer is causing symptoms hiccoughs. Patient states he is ready for discharge.        Rodena Medin, PA-C 09/21/11 2332  Rodena Medin, PA-C 12/22/11 1538

## 2011-09-21 NOTE — Discharge Instructions (Signed)
CALL DR. SHERRILL ON Monday TO SCHEDULE A TIME TO BE SEEN FOR FURTHER EVALUATION OF HICCOUGHS. TAKE MEDICATION AS PRESCRIBED. RETURN HERE AS NEEDED.  Hiccups Hiccups are caused by a sudden contraction of the muscles between the ribs and the muscle under your lungs (diaphragm). When you hiccup, the top of your windpipe (glottis) closes immediately after your diaphragm contracts. This makes the typical 'hic' sound. A hiccup is a reflex that you cannot stop. Unlike other reflexes such as coughing and sneezing, hiccups do not seem to have any useful purpose. There are 3 types of hiccups:   Benign bouts: last less than 48 hours.   Persistent: last more than 48 hours but less than 1 month.   Intractable: last more than 1 month.  CAUSES  Most people have bouts of hiccups from time to time. They start for no apparent reason, last a short while, and then stop. Sometimes they are due to:  A temporary swollen stomach caused by overeating or eating too fast, eating spicy foods, drinking fizzy drinks, or swallowing air.   A sudden change in temperature (very hot or cold foods or drinks, a cold shower).   Drinking alcohol or using tobacco.  There are no particular tests used to diagnose hiccups. Hiccups are usually considered harmless and do not point to a serious medical condition. However, there can be underlying medical problems that may cause hiccups, such as pneumonia, diabetes, metabolic problems, tumors, abdominal infections or injuries, and neurologic problems.You must follow up with your caregiver if your symptoms persist or become a frequent problem. TREATMENT   Most cases need no treatment. A bout of benign hiccups usually does not last long.   Medicine is sometimes needed to stop persistent hiccups. Medicine may be given intravenously (IV) or by mouth.   Hypnosis or acupuncture may be suggested.   Surgery to affect the nerve that supplies the diaphragm may be tried in severe cases.    Treatment of an underlying cause is needed in some cases.  HOME CARE INSTRUCTIONS  Popular remedies that may stop a bout of hiccups include:  Gargling ice water.   Swallowing granulated sugar.   Biting on a lemon.   Holding your breath, breathing fast, or breathing into a paper bag.   Bearing down.   Gasping after a sudden fright.   Pulling your tongue gently.   Distraction.  SEEK MEDICAL CARE IF:   Hiccups last for more than 48 hours.   You are given medicine but your hiccups do not get better.   New symptoms show up.   You cannot sleep or eat due to the hiccups.   You have unexpected weight loss.   You have trouble breathing or swallowing.   You develop severe pain in your abdomen or other areas.   You develop numbness, tingling, or weakness.  Document Released: 05/21/2001 Document Revised: 03/01/2011 Document Reviewed: 05/03/2010 [facility] Patient Information 2012 [place], [place].

## 2011-09-21 NOTE — ED Notes (Signed)
Per ems pt is from home. Alert and oriented x4, ambulatory. Pt walked to ems truck with steady gait.  Pt reports weakness/lethargy, and hiccups x 2 days. Pt has hx of esophageal cancer. Pt called 911 because he "did not want to deal with the hiccups for the rest of the night". Pt reports that "he had been laying around the house all day, and that was not normal for him."

## 2011-09-21 NOTE — ED Notes (Addendum)
PA at bedside.

## 2011-09-21 NOTE — ED Notes (Signed)
JYN:[id number]<AO> Expected date:<BR> Expected time:<BR> Means of arrival:<BR> Comments:<BR> EMS 41 GC, 75 yom weakness with lethargy

## 2011-09-22 NOTE — ED Notes (Signed)
Patient discharge via wheelchair with wife. Respirations equal and unlabored. Skin warm and dry. No acute distress noted.

## 2011-09-24 ENCOUNTER — Telehealth: Payer: Self-pay | Admitting: Internal Medicine

## 2011-09-24 ENCOUNTER — Ambulatory Visit (INDEPENDENT_AMBULATORY_CARE_PROVIDER_SITE_OTHER): Payer: Medicare Other | Admitting: Internal Medicine

## 2011-09-24 ENCOUNTER — Encounter: Payer: Self-pay | Admitting: Internal Medicine

## 2011-09-24 ENCOUNTER — Ambulatory Visit (INDEPENDENT_AMBULATORY_CARE_PROVIDER_SITE_OTHER)
Admission: RE | Admit: 2011-09-24 | Discharge: 2011-09-24 | Disposition: A | Discharge status: Home / Self Care | Payer: Medicare Other | Source: Ambulatory Visit | Attending: Internal Medicine | Admitting: Internal Medicine

## 2011-09-24 VITALS — BP 120/68 | HR 121 | Temp 99.6°F | Ht 75.0 in

## 2011-09-24 DIAGNOSIS — C154 Malignant neoplasm of middle third of esophagus: Secondary | ICD-10-CM

## 2011-09-24 DIAGNOSIS — R05 Cough: Secondary | ICD-10-CM | POA: Diagnosis not present

## 2011-09-24 DIAGNOSIS — R066 Hiccough: Secondary | ICD-10-CM

## 2011-09-24 DIAGNOSIS — J449 Chronic obstructive pulmonary disease, unspecified: Secondary | ICD-10-CM | POA: Diagnosis not present

## 2011-09-24 MED ORDER — BACLOFEN 10 MG PO TABS: 5.0000 mg | ORAL_TABLET | Freq: Three times a day (TID) | ORAL | Status: DC

## 2011-09-24 NOTE — Patient Instructions (Signed)
It was good to see you today. We have reviewed your prior records including labs and tests today Stop thorazine and use baclofen for hiccups - Your prescription(s) have been submitted to your pharmacy. Please take as directed and contact our office if you believe you are having problem(s) with the medication(s). Test(s) ordered today. Your results will be called to you after review (48-72hours after test completion). If any changes need to be made, you will be notified at that time. Followup with Dr Russella Dar and Truett Perna as panned - let them know about your hiccups if not improved with this medication

## 2011-09-24 NOTE — Progress Notes (Signed)
  Subjective:    Patient ID: Aaron [name], male    DOB: May 22, 1935, 76 y.o.   MRN: [id number]  HPI  Here for hiccups Onset 5 days ago Seen for same ER 6/28 - given thorazine 25mg  with improvement, but not resolved Denies prior hx same  Past Medical History  Diagnosis Date  . Diverticulosis of colon   . Gastritis   . Hemorrhoids   . GERD (gastroesophageal reflux disease)   . Hiatal hernia   . GERD (gastroesophageal reflux disease)   . Hyperlipidemia     Dr Tresa Endo  . HTN (hypertension)   . Elevated glucose   . COPD (chronic obstructive pulmonary disease)   . Esophageal cancer 2011    squamous cell ca  . History of radiation therapy 05/03/09-06/16/09    squamous cell ca of esophagus  . Hypothyroid     s/p radiotherapy  . Arrhythmia   . Allergic rhinitis     Review of Systems  Constitutional: Negative for fever and fatigue.  HENT: Positive for sore throat. Negative for trouble swallowing, voice change and postnasal drip.   Respiratory: Negative for cough and shortness of breath.        Objective:   Physical Exam BP 120/68  Pulse 121  Temp 99.6 F (37.6 C) (Oral)  Ht 6\' 3"  (1.905 m)  SpO2 97% Wt Readings from Last 3 Encounters:  09/06/11 206 lb (93.441 kg)  06/07/11 206 lb (93.441 kg)  05/31/11 207 lb (93.895 kg)   Constitutional:  He appears well-developed and well-nourished. No distress. Silent but frequent hiccups HENT: NCAT, OP clear Neck: Normal range of motion. Neck supple. No JVD present. No thyromegaly present.  Cardiovascular: slightly increased rate, regular rhythm and normal heart sounds.  No murmur heard. no BLE edema Pulmonary/Chest: Effort normal and breath sounds normal. No respiratory distress. no wheezes.  Neurological: he is alert and oriented to person, place, and time. No cranial nerve deficit. Coordination normal.  Skin: Skin is warm and dry.  No erythema or ulceration.  Psychiatric: he has a normal mood and affect. behavior is normal.  Judgment and thought content normal.   Lab Results  Component Value Date   WBC 4.4* 06/05/2011   HGB 12.9* 06/05/2011   HCT 38.6* 06/05/2011   PLT 238.0 06/05/2011   GLUCOSE 109* 06/05/2011   CHOL 149 05/29/2010   TRIG 88.0 05/29/2010   HDL 51.50 05/29/2010   LDLCALC 80 05/29/2010   ALT 16 12/25/2010   AST 25 12/25/2010   NA 136 06/05/2011   K 3.8 06/05/2011   CL 100 06/05/2011   CREATININE 1.6* 06/05/2011   BUN 15 06/05/2011   CO2 29 06/05/2011   TSH 0.05* 08/30/2011   PSA 1.81 05/29/2010   INR 1.19 04/28/2009   HGBA1C 6.6* 12/25/2010        Assessment & Plan:  Hiccups - ongoing >5d - ER eval for same reviewed Change compazine to baclofen Check CXR given esoph cancer hx and recent COPD flare

## 2011-09-24 NOTE — Assessment & Plan Note (Signed)
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

## 2011-09-24 NOTE — Telephone Encounter (Signed)
Caller: Brenda/Spouse; PCP: Plotnikov, Alex; CB#: [phone]; ; ; Call regarding Sore Throat After Food Lodged 09/20/2011;  Choked on big pill on 09/18/2011.  Choked again  on squash on 6/27, took finger and pushed it down, blood came back.  Developed hiccups.  Could not stop hiccups so went to ER Fri night 6/28.  Given Rx Chlorpronazine.  Hiccups come back if not taking med and med makes him drowsy.   Eating very little, drinking less than usual.  Still feels like something in throat, that throat may be trying to  closing up again.  Hiccups heard on phone, deep voice sounds different than usual.  Scratchy feeling in throat and throat seems to be getting sore.  Emergency appt 10:00 with Dr Felicity Coyer.  Reviewed 911 Sx with caller, will not eat or drink further until seen in office now.

## 2011-09-26 ENCOUNTER — Other Ambulatory Visit: Payer: Self-pay | Admitting: Internal Medicine

## 2011-09-26 ENCOUNTER — Telehealth: Payer: Self-pay | Admitting: *Deleted

## 2011-09-26 ENCOUNTER — Telehealth: Payer: Self-pay | Admitting: Oncology

## 2011-09-26 NOTE — Telephone Encounter (Signed)
Call from pt reporting he is concerned about his health. Having difficulty swallowing for about 10 days. Increasingly difficult to swallow pills. Went to ED for hiccoughs that would not stop. Has noticed his stools are dark as well. Pt reports he has called Dr. Ardell Isaacs office for sooner appt. Was told MD is out of office.  Pt stated he had not mentioned swallowing difficulty when he called. Instructed him to follow up with Dr. Russella Dar for this. May need endoscopic exam or dilation. Will make Dr. Truett Perna aware of call. He voiced understanding. Next scheduled visit 10/11/11 with Dr. Truett Perna.

## 2011-09-26 NOTE — Telephone Encounter (Signed)
Pt lmonvm re having difficulty swallowing again and talked about having an appt w/Dr. Russella Dar. Message to desk nurse. I called pt back re above but the phone keep being answer by a small child.

## 2011-09-26 NOTE — Telephone Encounter (Signed)
Patient is c/o dysphagia to liquids and solids.  He reports that he has also had a pill "that got lodged" last week.  I have advised him to start on a soft diet avoid chicken, bread, or large pieces of red meat.  Dr. Russella Dar he had his last dilation 08/2009.  Is it ok to set him up for a direct EGD? If yes can it be done during your hospital week

## 2011-09-27 NOTE — ED Provider Notes (Signed)
Medical screening examination/treatment/procedure(s) were performed by non-physician practitioner and as supervising physician I was immediately available for consultation/collaboration.  Jamarion Jumonville, MD 09/27/11 [phone] 

## 2011-09-30 NOTE — Telephone Encounter (Signed)
OK for direct EGD/Savary and I can do during hosp week

## 2011-10-01 ENCOUNTER — Other Ambulatory Visit: Payer: Self-pay

## 2011-10-01 NOTE — Telephone Encounter (Signed)
I have scheduled this for this Wed 10/03/11 @ [facility] he is to register at 7:30 in outpatient registration.  His wife is advised that he needs to be NPO after midnight.

## 2011-10-03 ENCOUNTER — Encounter (HOSPITAL_COMMUNITY): Payer: Self-pay | Admitting: Gastroenterology

## 2011-10-03 ENCOUNTER — Encounter (HOSPITAL_COMMUNITY)
Admission: RE | Disposition: A | Discharge status: Home / Self Care | Payer: Self-pay | Source: Ambulatory Visit | Attending: Gastroenterology

## 2011-10-03 ENCOUNTER — Ambulatory Visit (HOSPITAL_COMMUNITY)
Admission: RE | Admit: 2011-10-03 | Discharge: 2011-10-03 | Disposition: A | Discharge status: Home / Self Care | Payer: Medicare Other | Source: Ambulatory Visit | Attending: Gastroenterology | Admitting: Gastroenterology

## 2011-10-03 ENCOUNTER — Other Ambulatory Visit: Payer: Self-pay | Admitting: *Deleted

## 2011-10-03 DIAGNOSIS — K208 Other esophagitis without bleeding: Secondary | ICD-10-CM | POA: Insufficient documentation

## 2011-10-03 DIAGNOSIS — R1310 Dysphagia, unspecified: Secondary | ICD-10-CM | POA: Diagnosis not present

## 2011-10-03 DIAGNOSIS — Z923 Personal history of irradiation: Secondary | ICD-10-CM | POA: Insufficient documentation

## 2011-10-03 DIAGNOSIS — C154 Malignant neoplasm of middle third of esophagus: Secondary | ICD-10-CM | POA: Diagnosis not present

## 2011-10-03 DIAGNOSIS — E039 Hypothyroidism, unspecified: Secondary | ICD-10-CM | POA: Insufficient documentation

## 2011-10-03 DIAGNOSIS — I10 Essential (primary) hypertension: Secondary | ICD-10-CM | POA: Insufficient documentation

## 2011-10-03 DIAGNOSIS — K222 Esophageal obstruction: Secondary | ICD-10-CM | POA: Diagnosis not present

## 2011-10-03 DIAGNOSIS — R066 Hiccough: Secondary | ICD-10-CM | POA: Insufficient documentation

## 2011-10-03 DIAGNOSIS — K219 Gastro-esophageal reflux disease without esophagitis: Secondary | ICD-10-CM

## 2011-10-03 DIAGNOSIS — J4489 Other specified chronic obstructive pulmonary disease: Secondary | ICD-10-CM | POA: Insufficient documentation

## 2011-10-03 DIAGNOSIS — E785 Hyperlipidemia, unspecified: Secondary | ICD-10-CM | POA: Diagnosis not present

## 2011-10-03 DIAGNOSIS — J449 Chronic obstructive pulmonary disease, unspecified: Secondary | ICD-10-CM | POA: Insufficient documentation

## 2011-10-03 HISTORY — PX: SAVORY DILATION: SHX5439

## 2011-10-03 HISTORY — PX: ESOPHAGOGASTRODUODENOSCOPY: SHX5428

## 2011-10-03 SURGERY — EGD (ESOPHAGOGASTRODUODENOSCOPY)
Anesthesia: Moderate Sedation

## 2011-10-03 MED ORDER — MIDAZOLAM HCL 10 MG/2ML IJ SOLN
INTRAMUSCULAR | Status: DC | PRN
  Administered 2011-10-03 (×3): 2 mg via INTRAVENOUS

## 2011-10-03 MED ORDER — FENTANYL CITRATE 0.05 MG/ML IJ SOLN
INTRAMUSCULAR | Status: AC
  Filled 2011-10-03: qty 4

## 2011-10-03 MED ORDER — SODIUM CHLORIDE 0.9 % IV SOLN: Freq: Once | INTRAVENOUS | Status: DC

## 2011-10-03 MED ORDER — BUTAMBEN-TETRACAINE-BENZOCAINE 2-2-14 % EX AERO
INHALATION_SPRAY | CUTANEOUS | Status: DC | PRN
  Administered 2011-10-03: 2 via TOPICAL

## 2011-10-03 MED ORDER — FENTANYL CITRATE 0.05 MG/ML IJ SOLN
INTRAMUSCULAR | Status: DC | PRN
  Administered 2011-10-03 (×3): 25 ug via INTRAVENOUS

## 2011-10-03 MED ORDER — ACYCLOVIR 800 MG PO TABS: 800.0000 mg | ORAL_TABLET | Freq: Every day | ORAL | Status: AC

## 2011-10-03 MED ORDER — SODIUM CHLORIDE 0.9 % IV SOLN
INTRAVENOUS | Status: DC
  Administered 2011-10-03: 500 mL via INTRAVENOUS

## 2011-10-03 MED ORDER — MIDAZOLAM HCL 10 MG/2ML IJ SOLN
INTRAMUSCULAR | Status: AC
  Filled 2011-10-03: qty 4

## 2011-10-03 MED ORDER — DIPHENHYDRAMINE HCL 50 MG/ML IJ SOLN
INTRAMUSCULAR | Status: AC
  Filled 2011-10-03: qty 1

## 2011-10-03 NOTE — Op Note (Signed)
[facility] [address] [place], [place]  [address]  ENDOSCOPY PROCEDURE REPORT PATIENT:  Aaron [name], Aaron [name]  MR#:  [id number] BIRTHDATE:  12-Feb-1936, 75 yrs. old  GENDER:  male ENDOSCOPIST:  Judie Petit T. Russella Dar, MD, [facility]  PROCEDURE DATE:  10/03/2011 PROCEDURE:  EGD with dilatation over guidewire ASA CLASS:  Class II INDICATIONS:  dysphagia, gerd, dilation of esophageal stricture MEDICATIONS:  These medications were titrated to patient response per physician's verbal order, Fentanyl 75 mg IV, Versed 6 mg IV TOPICAL ANESTHETIC:  Cetacaine Spray DESCRIPTION OF PROCEDURE:   After the risks benefits and alternatives of the procedure were thoroughly explained, informed consent was obtained.  The Pentax Gastroscope [id number] endoscope was introduced through the mouth and advanced to the second portion of the duodenum, without limitations.  The instrument was slowly withdrawn as the mucosa was fully examined. <<PROCEDUREIMAGES>> A stricture was found in the proximal esophagus. It was friable and  benign appearing. It was located at 21 cm and measured 12 mm in diameter. Savary / guidewire with 14 mm, 15 mm and 16 mm dilators with minimal resistance and heme on all 3 dilators. Esophagitis was found in the distal esophagus. It was erosive. LA Class Grade C. Otherwise normal esophagus.  The stomach was entered and closely examined. The pylorus, antrum, angularis, and lesser curvature were well visualized, including a retroflexed view of the cardia and fundus. The stomach wall was normally distensable. The scope passed easily through the pylorus into the duodenum.  The duodenal bulb was normal in appearance, as was the postbulbar duodenum.    Retroflexed views revealed no abnormalities.    The scope was then withdrawn from the patient and the procedure completed.  COMPLICATIONS:  None  ENDOSCOPIC IMPRESSION: 1) Stricture in the proximal esophagus 2) Erosive  esophagitis  RECOMMENDATIONS: 1) Anti-reflux regimen long term 2) PPI qam long term 3) post dilation instructions 4) follow-up ov in 1 year and as needed  Kamaron Deskins T. Russella Dar, MD, [facility]  n. eSIGNED:   Venita Lick. Dari Carpenito at 10/03/2011 09:04 AM  [name], [phone]

## 2011-10-03 NOTE — Progress Notes (Signed)
Pt in recovery. While giving wife d/c instructions, she informs that pt has rash on chest and back.  Assessed pt's chest and back - rash was red, raised bumps that extended from under mid-right breast to under the right underarm and on the back from the mid back to under the right underarm.  Pt informed that the rash burned when he scratched it but he stated, "other than that it does not hurt".  Asked another RN to assess for 2nd opinion Marshell Garfinkel, RN).  Notified Dr. Russella Dar of rash.  Per Dr. Russella Dar, pt needs to f/u with his PCP.  Pt's wife requested that we call from the unit...she stated that she felt he would be seen sooner if we contacted Dr. Loren Racer office instead of her.  Called Dr. Posey Rea and informed him of the above.  He informed that he will f/u with an acyclovir rx and office visit.  Informed pt and wife of conversation with Dr. Posey Rea.

## 2011-10-03 NOTE — H&P (Signed)
HPI  Here for hiccups and progressive solid food dysphagia. Has history of a proximal esophageal stricture secondary to radiation therapy for esophageal cancer. Dysphagia progressive over 2 months.  Seen for hiccups ER 6/28 - given thorazine 25mg  with improvement, but not resolved  Denies prior hx same  Past Medical History   Diagnosis  Date   .  Diverticulosis of colon    .  Gastritis    .  Hemorrhoids    .  GERD (gastroesophageal reflux disease)    .  Hiatal hernia    .  GERD (gastroesophageal reflux disease)    .  Hyperlipidemia      Dr Tresa Endo   .  HTN (hypertension)    .  Elevated glucose    .  COPD (chronic obstructive pulmonary disease)    .  Esophageal cancer  2011     squamous cell ca   .  History of radiation therapy  05/03/09-06/16/09     squamous cell ca of esophagus   .  Hypothyroid      s/p radiotherapy   .  Arrhythmia    .  Allergic rhinitis     Review of Systems  Constitutional: Negative for fever and fatigue.  HENT: Positive for sore throat. Negative for trouble swallowing, voice change and postnasal drip.  Respiratory: Negative for cough and shortness of breath.    Objective:   Physical Exam  BP 120/68  Pulse 121  Temp 99.6 F (37.6 C) (Oral)  Ht 6\' 3"  (1.905 m)  SpO2 97%  Wt Readings from Last 3 Encounters:   09/06/11  206 lb (93.441 kg)   06/07/11  206 lb (93.441 kg)   05/31/11  207 lb (93.895 kg)    Constitutional: He appears well-developed and well-nourished. No distress.   HENT: NCAT, OP clear  Neck: Normal range of motion. Neck supple. No JVD present. No thyromegaly present.  Cardiovascular: slightly increased rate, regular rhythm and normal heart sounds. No murmur heard. no BLE edema  Pulmonary/Chest: Effort normal and breath sounds normal. No respiratory distress. no wheezes. Abdomen: soft, NT, ND, NABS, no HSM/masses/hernias  Neurological: he is alert and oriented to person, place, and time. No cranial nerve deficit. Coordination normal.    Skin: Skin is warm and dry. No erythema or ulceration.  Psychiatric: he has a normal mood and affect. behavior is normal. Judgment and thought content normal.  Lab Results   Component  Value  Date    WBC  4.4*  06/05/2011    HGB  12.9*  06/05/2011    HCT  38.6*  06/05/2011    PLT  238.0  06/05/2011    GLUCOSE  109*  06/05/2011    CHOL  149  05/29/2010    TRIG  88.0  05/29/2010    HDL  51.50  05/29/2010    LDLCALC  80  05/29/2010    ALT  16  12/25/2010    AST  25  12/25/2010    NA  136  06/05/2011    K  3.8  06/05/2011    CL  100  06/05/2011    CREATININE  1.6*  06/05/2011    BUN  15  06/05/2011    CO2  29  06/05/2011    TSH  0.05*  08/30/2011    PSA  1.81  05/29/2010    INR  1.19  04/28/2009    HGBA1C  6.6*  12/25/2010     Assessment & Plan:  Hiccups - Changed compazine to baclofen. Hiccups have now resolved.  Malignant neoplasm of thoracic esophagus. 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 Suspect recurrent esophageal stricture. The risks, benefits, and alternatives to endoscopy with possible biopsy and possible dilation were discussed with the patient and they consent to proceed.

## 2011-10-03 NOTE — Interval H&P Note (Signed)
History and Physical Interval Note:  10/03/2011 8:43 AM  Aaron [name]  has presented today for surgery, with the diagnosis of dysphagia  The various methods of treatment have been discussed with the patient and family. After consideration of risks, benefits and other options for treatment, the patient has consented to  Procedure(s) (LRB): ESOPHAGOGASTRODUODENOSCOPY (EGD) (N/A) [name] DILATION (N/A) as a surgical intervention .  The patient's history has been reviewed, patient examined, no change in status, stable for surgery.  I have reviewed the patients' chart and labs.  Questions were answered to the patient's satisfaction.     Venita Lick. Russella Dar MD [facility]

## 2011-10-04 ENCOUNTER — Encounter (HOSPITAL_COMMUNITY): Payer: Self-pay

## 2011-10-08 ENCOUNTER — Ambulatory Visit: Payer: Medicare Other | Admitting: Gastroenterology

## 2011-10-11 ENCOUNTER — Ambulatory Visit (HOSPITAL_BASED_OUTPATIENT_CLINIC_OR_DEPARTMENT_OTHER): Payer: Medicare Other | Admitting: Oncology

## 2011-10-11 ENCOUNTER — Telehealth: Payer: Self-pay | Admitting: Oncology

## 2011-10-11 VITALS — BP 101/65 | HR 97 | Temp 97.7°F | Ht 76.0 in | Wt 191.4 lb

## 2011-10-11 DIAGNOSIS — C154 Malignant neoplasm of middle third of esophagus: Secondary | ICD-10-CM

## 2011-10-11 DIAGNOSIS — C159 Malignant neoplasm of esophagus, unspecified: Secondary | ICD-10-CM

## 2011-10-11 DIAGNOSIS — R21 Rash and other nonspecific skin eruption: Secondary | ICD-10-CM | POA: Diagnosis not present

## 2011-10-11 DIAGNOSIS — R599 Enlarged lymph nodes, unspecified: Secondary | ICD-10-CM | POA: Diagnosis not present

## 2011-10-11 DIAGNOSIS — I10 Essential (primary) hypertension: Secondary | ICD-10-CM

## 2011-10-11 NOTE — Progress Notes (Signed)
   [facility]    OFFICE PROGRESS NOTE   INTERVAL HISTORY:   He returns as scheduled. He reports a 6 month history of increasing dysphagia. He saw Dr. Russella Dar and was taken to an upper endoscopy procedure on 10/03/2011. He was found to have a proximal esophageal stricture and erosive esophagitis. He underwent a dilatation procedure. The dysphagia has resolved. He reports feeling "weak" prior to the procedure. The weakness has resolved. He has no other complaint. He plans to return to work next week. He has a rash over the right back and chest. He reports being diagnosed with "shingles "by doctors Russella Dar and was started on acyclovir. The rash is resolving. Objective:  Vital signs in last 24 hours:  Blood pressure 101/65, pulse 97, temperature 97.7 F (36.5 C), temperature source Oral, height 6\' 4"  (1.93 m), weight 191 lb 6.4 oz (86.818 kg).    HEENT: Neck without mass Lymphatics: No cervical, supraclavicular, axillary, or inguinal lymph nodes Resp: Lungs clear bilaterally Cardio: Regular rate and rhythm GI: No hepatomegaly, no mass, nontender Vascular: No leg edema  Skin: There is a zoster rash at the right mid back and anterior chest, the vesicles appear to be crusting over      Medications: I have reviewed the patient's current medications.  Assessment/Plan: 1. Squamous cell carcinoma of the esophagus, status post an endoscopic biopsy, 04/07/2009, with the pathology confirming at least superficial invasion. 2. PET scan, 04/20/2009, confirmed hypermetabolic activity associated with the upper and thoracic esophagus mass with a single hypermetabolic right paratracheal lymph node.  He began concurrent radiation and weekly Taxol/carboplatin chemotherapy, 05/03/2009.  The last treatment with Taxol/carboplatin was given 06/14/2009.  He completed radiation, 06/16/2009.    3. Solid/liquid dysphagia secondary to the proximal esophageal mass, resolved.  He is status post an  esophageal dilatation for management of a stricture, 08/24/2009, repeat esophageal dilatation 10/03/2011 4. Borderline enlarged mediastinal and gastrohepatic lymph nodes on staging CT scans.  A PET scan, 04/20/2009, confirmed hypermetabolic activity associated with the right paratracheal lymph node. 5. Hypertension. 6. Status post placement of a feeding jejunostomy tube by Dr. Michaell Cowing, the jejunostomy tube has been removed. 7. Admission with neutropenia and anemia, March 2011.    8. Fever of unknown origin, April 2011, resolved.  He was treated with prednisone beginning on 07/13/2009 for presumed radiation pneumonitis.  He developed episodes of rigors on prednisone, and the prednisone was discontinued, 07/15/2009.  A CT scan did not reveal evidence for an infectious process. 9.     Zoster rash at the right back and anterior chest July 2013  Disposition:  Aaron [name] remains in clinical remission from esophagus cancer. He will followup with Dr. Russella Dar if the dysphagia returns. He will return for an office visit here in 6 months.   Aaron Papas, MD  10/11/2011  12:55 PM

## 2011-10-11 NOTE — Telephone Encounter (Signed)
appts made and printed for pt aom °

## 2011-11-29 DIAGNOSIS — E782 Mixed hyperlipidemia: Secondary | ICD-10-CM | POA: Diagnosis not present

## 2011-11-29 DIAGNOSIS — I2510 Atherosclerotic heart disease of native coronary artery without angina pectoris: Secondary | ICD-10-CM | POA: Diagnosis not present

## 2011-12-06 ENCOUNTER — Other Ambulatory Visit (INDEPENDENT_AMBULATORY_CARE_PROVIDER_SITE_OTHER): Payer: Medicare Other

## 2011-12-06 ENCOUNTER — Encounter: Payer: Self-pay | Admitting: Gastroenterology

## 2011-12-06 DIAGNOSIS — E785 Hyperlipidemia, unspecified: Secondary | ICD-10-CM | POA: Diagnosis not present

## 2011-12-06 DIAGNOSIS — C159 Malignant neoplasm of esophagus, unspecified: Secondary | ICD-10-CM

## 2011-12-06 DIAGNOSIS — I10 Essential (primary) hypertension: Secondary | ICD-10-CM | POA: Diagnosis not present

## 2011-12-06 DIAGNOSIS — E039 Hypothyroidism, unspecified: Secondary | ICD-10-CM

## 2011-12-06 DIAGNOSIS — J449 Chronic obstructive pulmonary disease, unspecified: Secondary | ICD-10-CM

## 2011-12-07 ENCOUNTER — Telehealth: Payer: Self-pay | Admitting: Internal Medicine

## 2011-12-07 LAB — BASIC METABOLIC PANEL
BUN: 20 mg/dL (ref 6–23)
CO2: 25 mEq/L (ref 19–32)
Calcium: 9.8 mg/dL (ref 8.4–10.5)
Glucose, Bld: 95 mg/dL (ref 70–99)
Sodium: 138 mEq/L (ref 135–145)

## 2011-12-07 NOTE — Telephone Encounter (Signed)
Take Levothyroxine 1/2 tab/day TSH in 6 wks Thx

## 2011-12-10 NOTE — Telephone Encounter (Signed)
Pt informed

## 2011-12-13 ENCOUNTER — Encounter: Payer: Self-pay | Admitting: Internal Medicine

## 2011-12-13 ENCOUNTER — Ambulatory Visit (INDEPENDENT_AMBULATORY_CARE_PROVIDER_SITE_OTHER): Payer: Medicare Other | Admitting: Internal Medicine

## 2011-12-13 VITALS — BP 110/72 | HR 76 | Temp 98.1°F | Resp 16 | Wt 197.0 lb

## 2011-12-13 DIAGNOSIS — R1319 Other dysphagia: Secondary | ICD-10-CM | POA: Diagnosis not present

## 2011-12-13 DIAGNOSIS — Z23 Encounter for immunization: Secondary | ICD-10-CM | POA: Diagnosis not present

## 2011-12-13 DIAGNOSIS — E039 Hypothyroidism, unspecified: Secondary | ICD-10-CM | POA: Diagnosis not present

## 2011-12-13 DIAGNOSIS — K219 Gastro-esophageal reflux disease without esophagitis: Secondary | ICD-10-CM | POA: Diagnosis not present

## 2011-12-13 NOTE — Assessment & Plan Note (Signed)
Resolved Continue with current prescription therapy as reflected on the Med list.  

## 2011-12-13 NOTE — Assessment & Plan Note (Signed)
Continue with current prescription therapy as reflected on the Med list.  

## 2011-12-13 NOTE — Assessment & Plan Note (Signed)
Now on 1/2 tab of 175 mcg tab a day x 1 wk SH in 1 mo

## 2011-12-13 NOTE — Progress Notes (Signed)
   Subjective:    Patient ID: Aaron [name], male    DOB: June 29, 1935, 76 y.o.   MRN: [id number]  HPI  The patient presents for a follow-up of  chronic hypertension, chronic dyslipidemia, type 2 pre-diabetes, wt loss controlled with medicines, diet. C/o cough at times - COPD.... No dysphagia. No hiccups. F/u esoph tumor     Review of Systems  Constitutional: Negative for appetite change, fatigue and unexpected weight change.  HENT: Negative for nosebleeds, congestion, sore throat, sneezing, trouble swallowing and neck pain.   Eyes: Negative for itching and visual disturbance.  Respiratory: Negative for cough.   Cardiovascular: Negative for chest pain, palpitations and leg swelling.  Gastrointestinal: Negative for nausea, diarrhea, blood in stool and abdominal distention.  Genitourinary: Negative for frequency and hematuria.  Musculoskeletal: Negative for back pain, joint swelling and gait problem.  Skin: Negative for rash.  Neurological: Negative for dizziness, tremors, speech difficulty and weakness.  Psychiatric/Behavioral: Negative for suicidal ideas, disturbed wake/sleep cycle, dysphoric mood and agitation. The patient is not nervous/anxious.     Wt Readings from Last 3 Encounters:  12/13/11 197 lb (89.359 kg)  10/11/11 191 lb 6.4 oz (86.818 kg)  09/06/11 206 lb (93.441 kg)   BP Readings from Last 3 Encounters:  12/13/11 110/72  10/11/11 101/65  10/03/11 101/70        Objective:   Physical Exam  Constitutional: He is oriented to person, place, and time. He appears well-developed.  HENT:  Mouth/Throat: Oropharynx is clear and moist.  Eyes: Conjunctivae normal are normal. Pupils are equal, round, and reactive to light.  Neck: Normal range of motion. No JVD present. No thyromegaly present.  Cardiovascular: Normal rate, regular rhythm, normal heart sounds and intact distal pulses.  Exam reveals no gallop and no friction rub.   No murmur heard. Pulmonary/Chest: Effort  normal and breath sounds normal. No respiratory distress. He has no wheezes. He has no rales. He exhibits no tenderness.  Abdominal: Soft. Bowel sounds are normal. He exhibits no distension and no mass. There is no tenderness. There is no rebound and no guarding.  Musculoskeletal: Normal range of motion. He exhibits no edema and no tenderness.  Lymphadenopathy:    He has no cervical adenopathy.  Neurological: He is alert and oriented to person, place, and time. He has normal reflexes. No cranial nerve deficit. He exhibits normal muscle tone. Coordination normal.  Skin: Skin is warm and dry. No rash noted.  Psychiatric: He has a normal mood and affect. His behavior is normal. Judgment and thought content normal.   Lab Results  Component Value Date   WBC 4.4* 06/05/2011   HGB 12.9* 06/05/2011   HCT 38.6* 06/05/2011   PLT 238.0 06/05/2011   GLUCOSE 95 12/06/2011   CHOL 149 05/29/2010   TRIG 88.0 05/29/2010   HDL 51.50 05/29/2010   LDLCALC 80 05/29/2010   ALT 16 12/25/2010   AST 25 12/25/2010   NA 138 12/06/2011   K 4.1 12/06/2011   CL 104 12/06/2011   CREATININE 1.3 12/06/2011   BUN 20 12/06/2011   CO2 25 12/06/2011   TSH 0.05* 12/06/2011   PSA 1.81 05/29/2010   INR 1.19 04/28/2009   HGBA1C 6.6* 12/25/2010            Assessment & Plan:

## 2011-12-20 DIAGNOSIS — H35379 Puckering of macula, unspecified eye: Secondary | ICD-10-CM | POA: Diagnosis not present

## 2011-12-20 DIAGNOSIS — H1045 Other chronic allergic conjunctivitis: Secondary | ICD-10-CM | POA: Diagnosis not present

## 2011-12-20 DIAGNOSIS — H43819 Vitreous degeneration, unspecified eye: Secondary | ICD-10-CM | POA: Diagnosis not present

## 2011-12-20 DIAGNOSIS — H40029 Open angle with borderline findings, high risk, unspecified eye: Secondary | ICD-10-CM | POA: Diagnosis not present

## 2011-12-20 DIAGNOSIS — H2510 Age-related nuclear cataract, unspecified eye: Secondary | ICD-10-CM | POA: Diagnosis not present

## 2011-12-22 NOTE — ED Provider Notes (Signed)
Medical screening examination/treatment/procedure(s) were performed by non-physician practitioner and as supervising physician I was immediately available for consultation/collaboration.  Raeford Razor, MD 12/22/11 [phone]

## 2012-01-17 ENCOUNTER — Ambulatory Visit (AMBULATORY_SURGERY_CENTER): Payer: Medicare Other | Admitting: *Deleted

## 2012-01-17 VITALS — Ht 76.0 in | Wt 199.6 lb

## 2012-01-17 DIAGNOSIS — Z1211 Encounter for screening for malignant neoplasm of colon: Secondary | ICD-10-CM

## 2012-01-17 MED ORDER — MOVIPREP 100 G PO SOLR: ORAL | Status: DC

## 2012-01-31 ENCOUNTER — Ambulatory Visit (AMBULATORY_SURGERY_CENTER): Payer: Medicare Other | Admitting: Gastroenterology

## 2012-01-31 ENCOUNTER — Encounter: Payer: Self-pay | Admitting: Gastroenterology

## 2012-01-31 VITALS — BP 100/61 | HR 68 | Temp 96.3°F | Resp 18

## 2012-01-31 DIAGNOSIS — E785 Hyperlipidemia, unspecified: Secondary | ICD-10-CM | POA: Diagnosis not present

## 2012-01-31 DIAGNOSIS — D649 Anemia, unspecified: Secondary | ICD-10-CM | POA: Diagnosis not present

## 2012-01-31 DIAGNOSIS — J449 Chronic obstructive pulmonary disease, unspecified: Secondary | ICD-10-CM | POA: Diagnosis not present

## 2012-01-31 DIAGNOSIS — Z1211 Encounter for screening for malignant neoplasm of colon: Secondary | ICD-10-CM

## 2012-01-31 DIAGNOSIS — D126 Benign neoplasm of colon, unspecified: Secondary | ICD-10-CM | POA: Diagnosis not present

## 2012-01-31 DIAGNOSIS — I10 Essential (primary) hypertension: Secondary | ICD-10-CM | POA: Diagnosis not present

## 2012-01-31 DIAGNOSIS — K219 Gastro-esophageal reflux disease without esophagitis: Secondary | ICD-10-CM | POA: Diagnosis not present

## 2012-01-31 DIAGNOSIS — E079 Disorder of thyroid, unspecified: Secondary | ICD-10-CM | POA: Diagnosis not present

## 2012-01-31 MED ORDER — SODIUM CHLORIDE 0.9 % IV SOLN: 500.0000 mL | INTRAVENOUS | Status: DC

## 2012-01-31 NOTE — Progress Notes (Signed)
Propofol per S [place] CRNA, see scanned intra procedure report. ewm 

## 2012-01-31 NOTE — Op Note (Signed)
[facility] [address] [place], [address]   COLONOSCOPY PROCEDURE REPORT  PATIENT: Sims, Laday  MR#: [id number] BIRTHDATE: [birth year] , 76  yrs. old GENDER: Male ENDOSCOPIST: Meryl Dare, MD, [facility] PROCEDURE DATE:  01/31/2012 PROCEDURE:   Colonoscopy with snare polypectomy ASA CLASS:   Class II INDICATIONS: average risk screening. MEDICATIONS: MAC sedation, administered by CRNA and propofol (Diprivan) 100mg  IV DESCRIPTION OF PROCEDURE:   After the risks benefits and alternatives of the procedure were thoroughly explained, informed consent was obtained.  A digital rectal exam revealed no abnormalities of the rectum.   The LB [id number]  endoscope was introduced through the anus and advanced to the cecum, which was identified by both the appendix and ileocecal valve. No adverse events experienced.   The quality of the prep was good, using MoviPrep  The instrument was then slowly withdrawn as the colon was fully examined.  COLON FINDINGS: A sessile polyp measuring 7 mm in size was found in the transverse colon.  A polypectomy was performed with a cold snare.  The resection was complete and the polyp tissue was completely retrieved.   Moderate diverticulosis was noted in the sigmoid colon and descending colon.   The colon was otherwise normal.  There was no diverticulosis, inflammation, polyps or cancers unless previously stated.  Retroflexed views revealed moderate internal hemorrhoids and hypertrophied anal papillae. The time to cecum=3 minutes 18 seconds.  Withdrawal time=8 minutes 20 seconds.  The scope was withdrawn and the procedure completed. COMPLICATIONS: There were no complications.  ENDOSCOPIC IMPRESSION: 1.   Sessile polyp in the transverse colon; polypectomy  performed with a cold snare 2.   Moderate diverticulosis was noted in the sigmoid colon and descending colon 3.   Internal hemorrhoids and hypertrophied anal  papillae  RECOMMENDATIONS: 1.  Await pathology results 2.  Repeat colonoscopy in 5 years if polyp adenomatous; otherwise given your age, you will not need another colonoscopy for colon cancer screening or polyp surveillance.  These types of tests usually stop around the age 5. 4.  High fiber diet with liberal fluid intake.   eSigned:  Meryl Dare, MD, [facility] 01/31/2012 10:00 AM

## 2012-01-31 NOTE — Progress Notes (Signed)
Patient did not experience any of the following events: a burn prior to discharge; a fall within the facility; wrong site/side/patient/procedure/implant event; or a hospital transfer or hospital admission upon discharge from the facility. (G8907) Patient did not have preoperative order for IV antibiotic SSI prophylaxis. (G8918)  

## 2012-01-31 NOTE — Patient Instructions (Addendum)
Discharge instructions given with verbal understanding. Handouts on polyps,diverticulosis and a high fiber diet given. Resume previous medications. YOU HAD AN ENDOSCOPIC PROCEDURE TODAY AT THE [facility]: Refer to the procedure report that was given to you for any specific questions about what was found during the examination.  If the procedure report does not answer your questions, please call your gastroenterologist to clarify.  If you requested that your care partner not be given the details of your procedure findings, then the procedure report has been included in a sealed envelope for you to review at your convenience later.  YOU SHOULD EXPECT: Some feelings of bloating in the abdomen. Passage of more gas than usual.  Walking can help get rid of the air that was put into your GI tract during the procedure and reduce the bloating. If you had a lower endoscopy (such as a colonoscopy or flexible sigmoidoscopy) you may notice spotting of blood in your stool or on the toilet paper. If you underwent a bowel prep for your procedure, then you may not have a normal bowel movement for a few days.  DIET: Your first meal following the procedure should be a light meal and then it is ok to progress to your normal diet.  A half-sandwich or bowl of soup is an example of a good first meal.  Heavy or fried foods are harder to digest and may make you feel nauseous or bloated.  Likewise meals heavy in dairy and vegetables can cause extra gas to form and this can also increase the bloating.  Drink plenty of fluids but you should avoid alcoholic beverages for 24 hours.  ACTIVITY: Your care partner should take you home directly after the procedure.  You should plan to take it easy, moving slowly for the rest of the day.  You can resume normal activity the day after the procedure however you should NOT DRIVE or use heavy machinery for 24 hours (because of the sedation medicines used during the test).     SYMPTOMS TO REPORT IMMEDIATELY: A gastroenterologist can be reached at any hour.  During normal business hours, 8:30 AM to 5:00 PM Monday through Friday, call [phone].  After hours and on weekends, please call the GI answering service at [phone] who will take a message and have the physician on call contact you.   Following lower endoscopy (colonoscopy or flexible sigmoidoscopy):  Excessive amounts of blood in the stool  Significant tenderness or worsening of abdominal pains  Swelling of the abdomen that is new, acute  Fever of 100F or higher FOLLOW UP: If any biopsies were taken you will be contacted by phone or by letter within the next 1-3 weeks.  Call your gastroenterologist if you have not heard about the biopsies in 3 weeks.  Our staff will call the home number listed on your records the next business day following your procedure to check on you and address any questions or concerns that you may have at that time regarding the information given to you following your procedure. This is a courtesy call and so if there is no answer at the home number and we have not heard from you through the emergency physician on call, we will assume that you have returned to your regular daily activities without incident.  SIGNATURES/CONFIDENTIALITY: You and/or your care partner have signed paperwork which will be entered into your electronic medical record.  These signatures attest to the fact that that the information above on your  After Visit Summary has been reviewed and is understood.  Full responsibility of the confidentiality of this discharge information lies with you and/or your care-partner.

## 2012-02-01 ENCOUNTER — Telehealth: Payer: Self-pay | Admitting: *Deleted

## 2012-02-01 NOTE — Telephone Encounter (Signed)
  Follow up Call-  Call back number 01/31/2012  Post procedure Call Back phone  # [phone]  Permission to leave phone message Yes     Patient questions:  Do you have a fever, pain , or abdominal swelling? no Pain Score  0 *  Have you tolerated food without any problems? yes  Have you been able to return to your normal activities? yes  Do you have any questions about your discharge instructions: Diet   no Medications  no Follow up visit  no  Do you have questions or concerns about your Care? no  Actions: * If pain score is 4 or above: No action needed, pain <4.  Information provided via wife pt. Gone to work.

## 2012-02-05 ENCOUNTER — Encounter: Payer: Self-pay | Admitting: Gastroenterology

## 2012-02-09 ENCOUNTER — Other Ambulatory Visit: Payer: Self-pay | Admitting: Internal Medicine

## 2012-02-11 NOTE — Telephone Encounter (Signed)
Ok to Rf? 

## 2012-02-13 NOTE — Telephone Encounter (Signed)
Pt called to check status of refill, please advise  

## 2012-02-16 ENCOUNTER — Other Ambulatory Visit: Payer: Self-pay | Admitting: Internal Medicine

## 2012-03-05 ENCOUNTER — Other Ambulatory Visit: Payer: Self-pay | Admitting: *Deleted

## 2012-03-05 MED ORDER — SIMVASTATIN 20 MG PO TABS: 20.0000 mg | ORAL_TABLET | Freq: Every day | ORAL | Status: DC

## 2012-03-06 ENCOUNTER — Other Ambulatory Visit (INDEPENDENT_AMBULATORY_CARE_PROVIDER_SITE_OTHER): Payer: Medicare Other

## 2012-03-06 DIAGNOSIS — E039 Hypothyroidism, unspecified: Secondary | ICD-10-CM

## 2012-03-13 ENCOUNTER — Ambulatory Visit (INDEPENDENT_AMBULATORY_CARE_PROVIDER_SITE_OTHER): Payer: Medicare Other | Admitting: Internal Medicine

## 2012-03-13 ENCOUNTER — Encounter: Payer: Self-pay | Admitting: Internal Medicine

## 2012-03-13 VITALS — BP 110/76 | HR 76 | Temp 97.9°F | Resp 16 | Wt 207.0 lb

## 2012-03-13 DIAGNOSIS — K219 Gastro-esophageal reflux disease without esophagitis: Secondary | ICD-10-CM

## 2012-03-13 DIAGNOSIS — K130 Diseases of lips: Secondary | ICD-10-CM | POA: Insufficient documentation

## 2012-03-13 DIAGNOSIS — J4489 Other specified chronic obstructive pulmonary disease: Secondary | ICD-10-CM

## 2012-03-13 DIAGNOSIS — R1319 Other dysphagia: Secondary | ICD-10-CM

## 2012-03-13 DIAGNOSIS — J449 Chronic obstructive pulmonary disease, unspecified: Secondary | ICD-10-CM

## 2012-03-13 DIAGNOSIS — R7309 Other abnormal glucose: Secondary | ICD-10-CM

## 2012-03-13 DIAGNOSIS — E039 Hypothyroidism, unspecified: Secondary | ICD-10-CM | POA: Diagnosis not present

## 2012-03-13 DIAGNOSIS — I10 Essential (primary) hypertension: Secondary | ICD-10-CM

## 2012-03-13 MED ORDER — CLOTRIMAZOLE-BETAMETHASONE 1-0.05 % EX CREA: TOPICAL_CREAM | Freq: Two times a day (BID) | CUTANEOUS | Status: DC

## 2012-03-13 NOTE — Progress Notes (Signed)
Patient ID: Aaron [name], male   DOB: 1935/04/20, 76 y.o.   MRN: [id number]   Subjective:    Patient ID: Aaron [name], male    DOB: 1935-10-09, 76 y.o.   MRN: [id number]  HPI  The patient presents for a follow-up of  chronic hypertension, chronic dyslipidemia, type 2 pre-diabetes, wt loss controlled with medicines, diet. C/o cough at times - COPD.... No dysphagia. No hiccups. C/o lip rash in the corners F/u esoph tumor     Review of Systems  Constitutional: Negative for appetite change, fatigue and unexpected weight change.  HENT: Negative for nosebleeds, congestion, sore throat, sneezing, trouble swallowing and neck pain.   Eyes: Negative for itching and visual disturbance.  Respiratory: Negative for cough.   Cardiovascular: Negative for chest pain, palpitations and leg swelling.  Gastrointestinal: Negative for nausea, diarrhea, blood in stool and abdominal distention.  Genitourinary: Negative for frequency and hematuria.  Musculoskeletal: Negative for back pain, joint swelling and gait problem.  Skin: Negative for rash.  Neurological: Negative for dizziness, tremors, speech difficulty and weakness.  Psychiatric/Behavioral: Negative for suicidal ideas, sleep disturbance, dysphoric mood and agitation. The patient is not nervous/anxious.     Wt Readings from Last 3 Encounters:  03/13/12 207 lb (93.895 kg)  01/17/12 199 lb 9.6 oz (90.538 kg)  12/13/11 197 lb (89.359 kg)   BP Readings from Last 3 Encounters:  03/13/12 110/76  01/31/12 100/61  12/13/11 110/72        Objective:   Physical Exam  Constitutional: He is oriented to person, place, and time. He appears well-developed.  HENT:  Mouth/Throat: Oropharynx is clear and moist.  Eyes: Conjunctivae normal are normal. Pupils are equal, round, and reactive to light.  Neck: Normal range of motion. No JVD present. No thyromegaly present.  Cardiovascular: Normal rate, regular rhythm, normal heart sounds and intact distal  pulses.  Exam reveals no gallop and no friction rub.   No murmur heard. Pulmonary/Chest: Effort normal and breath sounds normal. No respiratory distress. He has no wheezes. He has no rales. He exhibits no tenderness.  Abdominal: Soft. Bowel sounds are normal. He exhibits no distension and no mass. There is no tenderness. There is no rebound and no guarding.  Musculoskeletal: Normal range of motion. He exhibits no edema and no tenderness.  Lymphadenopathy:    He has no cervical adenopathy.  Neurological: He is alert and oriented to person, place, and time. He has normal reflexes. No cranial nerve deficit. He exhibits normal muscle tone. Coordination normal.  Skin: Skin is warm and dry. No rash noted.  Psychiatric: He has a normal mood and affect. His behavior is normal. Judgment and thought content normal.  angular stomatitis  Lab Results  Component Value Date   WBC 4.4* 06/05/2011   HGB 12.9* 06/05/2011   HCT 38.6* 06/05/2011   PLT 238.0 06/05/2011   GLUCOSE 95 12/06/2011   CHOL 149 05/29/2010   TRIG 88.0 05/29/2010   HDL 51.50 05/29/2010   LDLCALC 80 05/29/2010   ALT 16 12/25/2010   AST 25 12/25/2010   NA 138 12/06/2011   K 4.1 12/06/2011   CL 104 12/06/2011   CREATININE 1.3 12/06/2011   BUN 20 12/06/2011   CO2 25 12/06/2011   TSH 3.82 03/06/2012   PSA 1.81 05/29/2010   INR 1.19 04/28/2009   HGBA1C 6.6* 12/25/2010            Assessment & Plan:

## 2012-03-13 NOTE — Assessment & Plan Note (Signed)
Continue with current prescription therapy as reflected on the Med list.  

## 2012-03-13 NOTE — Assessment & Plan Note (Signed)
He can chek CBG at home given a machine

## 2012-03-13 NOTE — Assessment & Plan Note (Signed)
Lotrisone bid prn 

## 2012-03-13 NOTE — Assessment & Plan Note (Signed)
Doing good now.   

## 2012-03-29 ENCOUNTER — Other Ambulatory Visit: Payer: Self-pay | Admitting: Internal Medicine

## 2012-03-31 NOTE — Telephone Encounter (Signed)
Ok to Rf? 

## 2012-04-10 ENCOUNTER — Telehealth: Payer: Self-pay | Admitting: Oncology

## 2012-04-10 ENCOUNTER — Ambulatory Visit (HOSPITAL_BASED_OUTPATIENT_CLINIC_OR_DEPARTMENT_OTHER): Payer: Medicare Other | Admitting: Nurse Practitioner

## 2012-04-10 VITALS — BP 110/71 | HR 89 | Temp 98.1°F | Resp 18 | Ht 76.0 in | Wt 206.2 lb

## 2012-04-10 DIAGNOSIS — C159 Malignant neoplasm of esophagus, unspecified: Secondary | ICD-10-CM

## 2012-04-10 DIAGNOSIS — C154 Malignant neoplasm of middle third of esophagus: Secondary | ICD-10-CM | POA: Diagnosis not present

## 2012-04-10 NOTE — Progress Notes (Signed)
OFFICE PROGRESS NOTE  Interval history:  Aaron [name] returns as scheduled. He feels well. He states he feels the best that he has in 4 years. No interim illnesses or infections. He continues working part-time. He has a good appetite. He denies dysphagia. No nausea or vomiting. No pain. He occasionally notes discomfort at the left neck when he yawns. He denies mouth sores or mouth discomfort.   Objective: Blood pressure 110/71, pulse 89, temperature 98.1 F (36.7 C), temperature source Oral, resp. rate 18, height 6\' 4"  (1.93 m), weight 206 lb 3.2 oz (93.532 kg).  Patchy erythema at the posterior palate and posterior buccal regions. No palpable cervical, supraclavicular or axillary lymph nodes. Lungs are clear. Regular cardiac rhythm. Abdomen is soft and nontender. No organomegaly. Extremities are without edema.  Lab Results: Lab Results  Component Value Date   WBC 4.4* 06/05/2011   HGB 12.9* 06/05/2011   HCT 38.6* 06/05/2011   MCV 89.9 06/05/2011   PLT 238.0 06/05/2011    Chemistry:    Chemistry      Component Value Date/Time   NA 138 12/06/2011 0904   K 4.1 12/06/2011 0904   CL 104 12/06/2011 0904   CO2 25 12/06/2011 0904   BUN 20 12/06/2011 0904   CREATININE 1.3 12/06/2011 0904      Component Value Date/Time   CALCIUM 9.8 12/06/2011 0904   ALKPHOS 95 12/25/2010 0809   AST 25 12/25/2010 0809   ALT 16 12/25/2010 0809   BILITOT 0.6 12/25/2010 0809       Studies/Results: No results found.  Medications: I have reviewed the patient's current medications.  Assessment/Plan:  1. Squamous cell carcinoma of the esophagus, status post an endoscopic biopsy, 04/07/2009, with the pathology confirming at least superficial invasion. 2. PET scan, 04/20/2009, confirmed hypermetabolic activity associated with the upper and thoracic esophagus mass with a single hypermetabolic right paratracheal lymph node. He began concurrent radiation and weekly Taxol/carboplatin chemotherapy, 05/03/2009. The last  treatment with Taxol/carboplatin was given 06/14/2009. He completed radiation on 06/16/2009.  3. Solid/liquid dysphagia secondary to the proximal esophageal mass, resolved. He is status post an esophageal dilatation for management of a stricture, 08/24/2009, repeat esophageal dilatation 10/03/2011. 4. Borderline enlarged mediastinal and gastrohepatic lymph nodes on staging CT scans. PET scan 04/20/2009 confirmed hypermetabolic activity associated with the right paratracheal lymph node. 5. Hypertension. 6. Status post placement of a feeding jejunostomy tube by Dr. Michaell Cowing. The jejunostomy tube has been removed. 7. Admission with neutropenia and anemia, March 2011.  8. Fever of unknown origin, April 2011, resolved. He was treated with prednisone beginning on 07/13/2009 for presumed radiation pneumonitis. He developed episodes of rigors on prednisone, and the prednisone was discontinued, 07/15/2009. A CT scan did not reveal evidence for an infectious process. 9. Zoster rash at the right back and anterior chest July 2013. 10. Patchy erythema at the posterior palate and bilateral posterior buccal regions. Question etiology. He will contact the office if he develops pain.  Disposition-Aaron [name] appears stable. He remains in clinical remission from the esophagus cancer. He will return for a followup visit in 6 months. He will contact the office in the interim with any problems.  Plan reviewed with Dr. Truett Perna.   Lonna Cobb ANP/GNP-BC

## 2012-04-10 NOTE — Telephone Encounter (Signed)
Gave pt apt for MD only on July 16th

## 2012-05-22 DIAGNOSIS — H40029 Open angle with borderline findings, high risk, unspecified eye: Secondary | ICD-10-CM | POA: Diagnosis not present

## 2012-05-22 DIAGNOSIS — H04129 Dry eye syndrome of unspecified lacrimal gland: Secondary | ICD-10-CM | POA: Diagnosis not present

## 2012-05-26 ENCOUNTER — Encounter: Payer: Self-pay | Admitting: Radiation Oncology

## 2012-05-29 ENCOUNTER — Encounter: Payer: Self-pay | Admitting: Radiation Oncology

## 2012-05-29 ENCOUNTER — Ambulatory Visit
Admission: RE | Admit: 2012-05-29 | Discharge: 2012-05-29 | Disposition: A | Discharge status: Home / Self Care | Payer: Medicare Other | Source: Ambulatory Visit | Attending: Radiation Oncology | Admitting: Radiation Oncology

## 2012-05-29 VITALS — BP 114/71 | HR 80 | Temp 97.4°F | Resp 20 | Wt 212.2 lb

## 2012-05-29 DIAGNOSIS — C154 Malignant neoplasm of middle third of esophagus: Secondary | ICD-10-CM

## 2012-05-29 HISTORY — DX: Personal history of antineoplastic chemotherapy: Z92.21

## 2012-05-29 NOTE — Progress Notes (Signed)
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  He denies swallowing trouble. Has a congestive cough, takes self made cough concotion of makes a quart of A(moonshine,rock candy-2 boxes , honey 1/2 pint,and bag of ricola cough drops, ), drinks 1 ounce weekly prn, no c/o pain, difficulty swallowing or chewing, no nausea, still works part time, no fatigue, eating well, last TSH=03/06/12=3.82, 99% room iar                             ALLERGIES:  is allergic to amoxicillin; cefuroxime axetil; and fluconazole.  Meds: Current Outpatient Prescriptions  Medication Sig Dispense Refill  . Aclidinium Bromide 400 MCG/ACT AEPB Inhale 1 Act into the lungs 2 (two) times daily.      Marland Kitchen aspirin 81 MG EC tablet Take 81 mg by mouth daily.        . baclofen (LIORESAL) 10 MG tablet Take 10 mg by mouth Daily.      . calcium carbonate (TUMS - DOSED IN MG ELEMENTAL CALCIUM) 500 MG chewable tablet Chew 2 tablets by mouth 2 (two) times daily as needed. Acid reflux/gas      . Cholecalciferol 1000 UNITS tablet Take 1,000 Units by mouth daily.        . clotrimazole-betamethasone (LOTRISONE) cream Apply topically 2 (two) times daily.  15 g  2  . guaiFENesin (MUCINEX) 600 MG 12 hr tablet Take 1,200 mg by mouth as needed for congestion.      . Ipratropium-Albuterol (COMBIVENT IN) Inhale into the lungs as needed.      Marland Kitchen levothyroxine (SYNTHROID, LEVOTHROID) 175 MCG tablet Take 175 mcg by mouth daily. 1/2 tab daily      . loratadine (CLARITIN) 10 MG tablet Take 10 mg by mouth daily.       .  mineral oil-hydrophilic petrolatum (AQUAPHOR) ointment Apply 1 application topically as needed.      . Mometasone Furo-Formoterol Fum (DULERA IN) Inhale into the lungs as needed.      Marland Kitchen omeprazole (PRILOSEC) 40 MG capsule TAKE ONE CAPSULE BY MOUTH DAILY  90 capsule  2  . simvastatin (ZOCOR) 20 MG tablet Take 1 tablet (20 mg total) by mouth daily.  30 tablet  5  . clonazePAM (KLONOPIN) 1 MG tablet TAKE 1/2 TO 1 TABLET BY MOUTH TWICE DAILY AS NEEDED FOR ANXIETY , SHAKING OR INSOMNIA  60 tablet  3   No current facility-administered medications for this encounter.    Physical Findings: The patient is in no acute distress. Patient is alert and oriented.  weight is 212 lb 3.2 oz (96.253 kg). His oral temperature is 97.4 F (36.3 C). His blood pressure is 114/71 and his pulse is 80. His respiration is 20 and oxygen saturation is 99%. .  Weight Up:  No significant changes.  Lab Findings: Lab Results  Component Value Date   WBC 4.4* 06/05/2011   HGB 12.9* 06/05/2011   HCT 38.6* 06/05/2011   MCV 89.9 06/05/2011   PLT 238.0 06/05/2011  Impression:  The patient has no evidence to suggest recurrence.  Plan:  Follow-up in 1 year  _____________________________________  Artist Pais. Kathrynn Running, M.D.

## 2012-05-29 NOTE — Progress Notes (Signed)
Patient here follow up s/p rad esophagus  05/03/09-06/16/09 Has a congestive cough, takes self made cough  concotion of makes a quart of A(moonshine,rock candy-2 boxes , honey 1/2 pint,and bag of ricola cough drops, ), drinks 1 ounce weekly prn , no c/o pain, difficulty swallowing or chewing, no nausea, still works part time, no fatigue, eating well, last TSH=03/06/12=3.82, 99% room iar,

## 2012-06-12 ENCOUNTER — Other Ambulatory Visit (INDEPENDENT_AMBULATORY_CARE_PROVIDER_SITE_OTHER): Payer: Medicare Other

## 2012-06-12 ENCOUNTER — Encounter: Payer: Self-pay | Admitting: Internal Medicine

## 2012-06-12 ENCOUNTER — Ambulatory Visit (INDEPENDENT_AMBULATORY_CARE_PROVIDER_SITE_OTHER): Payer: Medicare Other | Admitting: Internal Medicine

## 2012-06-12 VITALS — BP 110/80 | HR 76 | Temp 97.8°F | Wt 211.0 lb

## 2012-06-12 DIAGNOSIS — E785 Hyperlipidemia, unspecified: Secondary | ICD-10-CM

## 2012-06-12 DIAGNOSIS — C159 Malignant neoplasm of esophagus, unspecified: Secondary | ICD-10-CM

## 2012-06-12 DIAGNOSIS — J4489 Other specified chronic obstructive pulmonary disease: Secondary | ICD-10-CM

## 2012-06-12 DIAGNOSIS — I10 Essential (primary) hypertension: Secondary | ICD-10-CM

## 2012-06-12 DIAGNOSIS — R7309 Other abnormal glucose: Secondary | ICD-10-CM

## 2012-06-12 DIAGNOSIS — R1319 Other dysphagia: Secondary | ICD-10-CM

## 2012-06-12 DIAGNOSIS — J449 Chronic obstructive pulmonary disease, unspecified: Secondary | ICD-10-CM | POA: Diagnosis not present

## 2012-06-12 DIAGNOSIS — K219 Gastro-esophageal reflux disease without esophagitis: Secondary | ICD-10-CM

## 2012-06-12 DIAGNOSIS — E039 Hypothyroidism, unspecified: Secondary | ICD-10-CM

## 2012-06-12 LAB — BASIC METABOLIC PANEL
BUN: 17 mg/dL (ref 6–23)
Calcium: 9.7 mg/dL (ref 8.4–10.5)
Creatinine, Ser: 1.4 mg/dL (ref 0.4–1.5)
GFR: 65.24 mL/min (ref >60.00)
Glucose, Bld: 90 mg/dL (ref 70–99)
Sodium: 137 mEq/L (ref 135–145)

## 2012-06-12 NOTE — Assessment & Plan Note (Signed)
Continue with current prescription therapy as reflected on the Med list.  

## 2012-06-12 NOTE — Assessment & Plan Note (Signed)
No relapse 

## 2012-06-12 NOTE — Assessment & Plan Note (Signed)
Doing ok.

## 2012-06-12 NOTE — Progress Notes (Signed)
   Subjective:    HPI  The patient presents for a follow-up of  chronic hypertension, chronic dyslipidemia, type 2 pre-diabetes, wt loss controlled with medicines, diet. C/o cough at times - COPD.... No dysphagia. No hiccups. F/u wt loss - gained wt F/u esoph tumor     Review of Systems  Constitutional: Negative for appetite change, fatigue and unexpected weight change.  HENT: Negative for nosebleeds, congestion, sore throat, sneezing, trouble swallowing and neck pain.   Eyes: Negative for itching and visual disturbance.  Respiratory: Negative for cough.   Cardiovascular: Negative for chest pain, palpitations and leg swelling.  Gastrointestinal: Negative for nausea, diarrhea, blood in stool and abdominal distention.  Genitourinary: Negative for frequency and hematuria.  Musculoskeletal: Negative for back pain, joint swelling and gait problem.  Skin: Negative for rash.  Neurological: Negative for dizziness, tremors, speech difficulty and weakness.  Psychiatric/Behavioral: Negative for suicidal ideas, sleep disturbance, dysphoric mood and agitation. The patient is not nervous/anxious.     Wt Readings from Last 3 Encounters:  06/12/12 211 lb 0.6 oz (95.727 kg)  05/29/12 212 lb 3.2 oz (96.253 kg)  04/10/12 206 lb 3.2 oz (93.532 kg)   BP Readings from Last 3 Encounters:  06/12/12 110/80  05/29/12 114/71  04/10/12 110/71        Objective:   Physical Exam  Constitutional: He is oriented to person, place, and time. He appears well-developed.  HENT:  Mouth/Throat: Oropharynx is clear and moist.  Eyes: Conjunctivae are normal. Pupils are equal, round, and reactive to light.  Neck: Normal range of motion. No JVD present. No thyromegaly present.  Cardiovascular: Normal rate, regular rhythm, normal heart sounds and intact distal pulses.  Exam reveals no gallop and no friction rub.   No murmur heard. Pulmonary/Chest: Effort normal and breath sounds normal. No respiratory distress.  He has no wheezes. He has no rales. He exhibits no tenderness.  Abdominal: Soft. Bowel sounds are normal. He exhibits no distension and no mass. There is no tenderness. There is no rebound and no guarding.  Musculoskeletal: Normal range of motion. He exhibits no edema and no tenderness.  Lymphadenopathy:    He has no cervical adenopathy.  Neurological: He is alert and oriented to person, place, and time. He has normal reflexes. No cranial nerve deficit. He exhibits normal muscle tone. Coordination normal.  Skin: Skin is warm and dry. No rash noted.  Psychiatric: He has a normal mood and affect. His behavior is normal. Judgment and thought content normal.   Lab Results  Component Value Date   WBC 4.4* 06/05/2011   HGB 12.9* 06/05/2011   HCT 38.6* 06/05/2011   PLT 238.0 06/05/2011   GLUCOSE 95 12/06/2011   CHOL 149 05/29/2010   TRIG 88.0 05/29/2010   HDL 51.50 05/29/2010   LDLCALC 80 05/29/2010   ALT 16 12/25/2010   AST 25 12/25/2010   NA 138 12/06/2011   K 4.1 12/06/2011   CL 104 12/06/2011   CREATININE 1.3 12/06/2011   BUN 20 12/06/2011   CO2 25 12/06/2011   TSH 3.82 03/06/2012   PSA 1.81 05/29/2010   INR 1.19 04/28/2009   HGBA1C 6.6* 12/25/2010            Assessment & Plan:

## 2012-06-12 NOTE — Assessment & Plan Note (Signed)
Watching  

## 2012-06-16 ENCOUNTER — Telehealth: Payer: Self-pay | Admitting: Internal Medicine

## 2012-06-16 DIAGNOSIS — E039 Hypothyroidism, unspecified: Secondary | ICD-10-CM

## 2012-06-16 NOTE — Telephone Encounter (Signed)
How much Synthroid is he taking Thx

## 2012-06-16 NOTE — Telephone Encounter (Signed)
He is taking 175 mcg 1/2 qd

## 2012-06-17 NOTE — Telephone Encounter (Signed)
Take 112 mcg a day. OK to call in  A new Rx TSH in 2 mo Thx

## 2012-06-18 MED ORDER — LEVOTHYROXINE SODIUM 112 MCG PO TABS: 112.0000 ug | ORAL_TABLET | Freq: Every day | ORAL | Status: DC

## 2012-06-18 NOTE — Telephone Encounter (Signed)
Pt informed. Rx sent. Lab ordered.

## 2012-08-14 ENCOUNTER — Other Ambulatory Visit (INDEPENDENT_AMBULATORY_CARE_PROVIDER_SITE_OTHER): Payer: Medicare Other

## 2012-08-14 DIAGNOSIS — E039 Hypothyroidism, unspecified: Secondary | ICD-10-CM

## 2012-08-14 LAB — TSH: TSH: 2.01 u[IU]/mL (ref 0.35–5.50)

## 2012-08-21 ENCOUNTER — Encounter: Payer: Self-pay | Admitting: Internal Medicine

## 2012-08-21 ENCOUNTER — Ambulatory Visit (INDEPENDENT_AMBULATORY_CARE_PROVIDER_SITE_OTHER): Payer: Medicare Other | Admitting: Internal Medicine

## 2012-08-21 VITALS — BP 108/80 | HR 68 | Temp 97.6°F | Resp 16 | Wt 209.0 lb

## 2012-08-21 DIAGNOSIS — I10 Essential (primary) hypertension: Secondary | ICD-10-CM | POA: Diagnosis not present

## 2012-08-21 DIAGNOSIS — K222 Esophageal obstruction: Secondary | ICD-10-CM

## 2012-08-21 DIAGNOSIS — E785 Hyperlipidemia, unspecified: Secondary | ICD-10-CM | POA: Diagnosis not present

## 2012-08-21 DIAGNOSIS — E039 Hypothyroidism, unspecified: Secondary | ICD-10-CM

## 2012-08-21 MED ORDER — OMEPRAZOLE 40 MG PO CPDR: 40.0000 mg | DELAYED_RELEASE_CAPSULE | Freq: Every day | ORAL | Status: DC

## 2012-08-21 MED ORDER — CLONAZEPAM 1 MG PO TABS: 0.5000 mg | ORAL_TABLET | Freq: Two times a day (BID) | ORAL | Status: DC | PRN

## 2012-08-21 MED ORDER — SIMVASTATIN 20 MG PO TABS: 20.0000 mg | ORAL_TABLET | Freq: Every day | ORAL | Status: DC

## 2012-08-21 MED ORDER — LEVOTHYROXINE SODIUM 112 MCG PO TABS: 112.0000 ug | ORAL_TABLET | Freq: Every day | ORAL | Status: DC

## 2012-08-21 NOTE — Assessment & Plan Note (Signed)
Continue with current prescription therapy as reflected on the Med list.  

## 2012-08-21 NOTE — Assessment & Plan Note (Signed)
Lab Results  Component Value Date   TSH 2.01 08/14/2012  Continue with current prescription therapy as reflected on the Med list.

## 2012-08-21 NOTE — Assessment & Plan Note (Signed)
No sx's lately 

## 2012-08-21 NOTE — Progress Notes (Signed)
Patient ID: Aaron [name], male   DOB: [birth year], 77 y.o.   MRN: [id number]   Subjective:    HPI  The patient presents for a follow-up of  chronic hypertension, chronic dyslipidemia, type 2 pre-diabetes, wt loss controlled with medicines, diet. C/o cough at times - COPD.... No dysphagia. No hiccups. F/u wt loss - gained wt F/u esoph tumor     Review of Systems  Constitutional: Negative for appetite change, fatigue and unexpected weight change.  HENT: Negative for nosebleeds, congestion, sore throat, sneezing, trouble swallowing and neck pain.   Eyes: Negative for itching and visual disturbance.  Respiratory: Negative for cough.   Cardiovascular: Negative for chest pain, palpitations and leg swelling.  Gastrointestinal: Negative for nausea, diarrhea, blood in stool and abdominal distention.  Genitourinary: Negative for frequency and hematuria.  Musculoskeletal: Negative for back pain, joint swelling and gait problem.  Skin: Negative for rash.  Neurological: Negative for dizziness, tremors, speech difficulty and weakness.  Psychiatric/Behavioral: Negative for suicidal ideas, sleep disturbance, dysphoric mood and agitation. The patient is not nervous/anxious.     Wt Readings from Last 3 Encounters:  08/21/12 209 lb (94.802 kg)  06/12/12 211 lb 0.6 oz (95.727 kg)  05/29/12 212 lb 3.2 oz (96.253 kg)   BP Readings from Last 3 Encounters:  08/21/12 108/80  06/12/12 110/80  05/29/12 114/71        Objective:   Physical Exam  Constitutional: He is oriented to person, place, and time. He appears well-developed.  HENT:  Mouth/Throat: Oropharynx is clear and moist.  Eyes: Conjunctivae are normal. Pupils are equal, round, and reactive to light.  Neck: Normal range of motion. No JVD present. No thyromegaly present.  Cardiovascular: Normal rate, regular rhythm, normal heart sounds and intact distal pulses.  Exam reveals no gallop and no friction rub.   No murmur  heard. Pulmonary/Chest: Effort normal and breath sounds normal. No respiratory distress. He has no wheezes. He has no rales. He exhibits no tenderness.  Abdominal: Soft. Bowel sounds are normal. He exhibits no distension and no mass. There is no tenderness. There is no rebound and no guarding.  Musculoskeletal: Normal range of motion. He exhibits no edema and no tenderness.  Lymphadenopathy:    He has no cervical adenopathy.  Neurological: He is alert and oriented to person, place, and time. He has normal reflexes. No cranial nerve deficit. He exhibits normal muscle tone. Coordination normal.  Skin: Skin is warm and dry. No rash noted.  Psychiatric: He has a normal mood and affect. His behavior is normal. Judgment and thought content normal.   Lab Results  Component Value Date   WBC 4.4* 06/05/2011   HGB 12.9* 06/05/2011   HCT 38.6* 06/05/2011   PLT 238.0 06/05/2011   GLUCOSE 90 06/12/2012   CHOL 149 05/29/2010   TRIG 88.0 05/29/2010   HDL 51.50 05/29/2010   LDLCALC 80 05/29/2010   ALT 16 12/25/2010   AST 25 12/25/2010   NA 137 06/12/2012   K 4.4 06/12/2012   CL 100 06/12/2012   CREATININE 1.4 06/12/2012   BUN 17 06/12/2012   CO2 29 06/12/2012   TSH 2.01 08/14/2012   PSA 1.81 05/29/2010   INR 1.19 04/28/2009   HGBA1C 6.6* 12/25/2010            Assessment & Plan:

## 2012-10-09 ENCOUNTER — Ambulatory Visit (HOSPITAL_BASED_OUTPATIENT_CLINIC_OR_DEPARTMENT_OTHER): Payer: Medicare Other | Admitting: Oncology

## 2012-10-09 ENCOUNTER — Telehealth: Payer: Self-pay | Admitting: Oncology

## 2012-10-09 VITALS — BP 124/64 | HR 81 | Temp 97.6°F | Resp 20 | Ht 76.0 in | Wt 210.1 lb

## 2012-10-09 DIAGNOSIS — C159 Malignant neoplasm of esophagus, unspecified: Secondary | ICD-10-CM

## 2012-10-09 NOTE — Progress Notes (Signed)
   [facility]    OFFICE PROGRESS NOTE   INTERVAL HISTORY:   He returns as scheduled. He feels well. No dysphagia. Good appetite. He continues to work. Chronic cough.  Objective:  Vital signs in last 24 hours:  Blood pressure 124/64, pulse 81, temperature 97.6 F (36.4 C), temperature source Oral, resp. rate 20, height 6\' 4"  (1.93 m), weight 210 lb 1.6 oz (95.301 kg).    HEENT: Neck without mass Lymphatics: No cervical, supraclavicular, or axillary nodes Resp: Lungs clear bilaterally Cardio: Regular rate and rhythm GI: No hepatomegaly Vascular: No leg edema  Medications: I have reviewed the patient's current medications.  Assessment/Plan: 1. Squamous cell carcinoma of the esophagus, status post an endoscopic biopsy, 04/07/2009, with the pathology confirming at least superficial invasion. 2. PET scan, 04/20/2009, confirmed hypermetabolic activity associated with the upper and thoracic esophagus mass with a single hypermetabolic right paratracheal lymph node. He began concurrent radiation and weekly Taxol/carboplatin chemotherapy, 05/03/2009. The last treatment with Taxol/carboplatin was given 06/14/2009. He completed radiation on 06/16/2009.  3. Solid/liquid dysphagia secondary to the proximal esophageal mass, resolved. He is status post an esophageal dilatation for management of a stricture, 08/24/2009, repeat esophageal dilatation 10/03/2011. 4. Borderline enlarged mediastinal and gastrohepatic lymph nodes on staging CT scans. PET scan 04/20/2009 confirmed hypermetabolic activity associated with the right paratracheal lymph node. 5. Hypertension. 6. Status post placement of a feeding jejunostomy tube by Dr. Michaell Cowing. The jejunostomy tube has been removed. 7. Admission with neutropenia and anemia, March 2011.  8. Fever of unknown origin, April 2011, resolved. He was treated with prednisone beginning on 07/13/2009 for presumed radiation pneumonitis. He developed  episodes of rigors on prednisone, and the prednisone was discontinued, 07/15/2009. A CT scan did not reveal evidence for an infectious process. 9. Zoster rash at the right back and anterior chest July 2013.    Disposition:  Mr. Marciano remains in clinical remission from esophagus cancer. He will return for an office visit in 6 months.   Thornton Papas, MD  10/09/2012  7:09 PM

## 2012-10-09 NOTE — Telephone Encounter (Signed)
gv and printed appt sched and avs for pt  °

## 2012-10-16 ENCOUNTER — Ambulatory Visit (INDEPENDENT_AMBULATORY_CARE_PROVIDER_SITE_OTHER): Payer: Medicare Other | Admitting: Internal Medicine

## 2012-10-16 ENCOUNTER — Encounter: Payer: Self-pay | Admitting: Internal Medicine

## 2012-10-16 VITALS — BP 130/80 | HR 80 | Temp 98.3°F | Resp 16 | Wt 207.0 lb

## 2012-10-16 DIAGNOSIS — N320 Bladder-neck obstruction: Secondary | ICD-10-CM

## 2012-10-16 DIAGNOSIS — H6120 Impacted cerumen, unspecified ear: Secondary | ICD-10-CM

## 2012-10-16 DIAGNOSIS — R7309 Other abnormal glucose: Secondary | ICD-10-CM

## 2012-10-16 DIAGNOSIS — I10 Essential (primary) hypertension: Secondary | ICD-10-CM

## 2012-10-16 DIAGNOSIS — C159 Malignant neoplasm of esophagus, unspecified: Secondary | ICD-10-CM | POA: Diagnosis not present

## 2012-10-16 DIAGNOSIS — H6121 Impacted cerumen, right ear: Secondary | ICD-10-CM

## 2012-10-16 DIAGNOSIS — R1319 Other dysphagia: Secondary | ICD-10-CM

## 2012-10-16 DIAGNOSIS — Z0000 Encounter for general adult medical examination without abnormal findings: Secondary | ICD-10-CM

## 2012-10-16 DIAGNOSIS — E039 Hypothyroidism, unspecified: Secondary | ICD-10-CM

## 2012-10-16 DIAGNOSIS — D509 Iron deficiency anemia, unspecified: Secondary | ICD-10-CM

## 2012-10-16 NOTE — Assessment & Plan Note (Signed)
See procedure 

## 2012-10-16 NOTE — Assessment & Plan Note (Signed)
F/u w/Oncology 

## 2012-10-16 NOTE — Assessment & Plan Note (Signed)
No relapse 

## 2012-10-16 NOTE — Progress Notes (Signed)
Subjective:    HPI  The patient presents for a follow-up of  chronic hypertension, chronic dyslipidemia, type 2 pre-diabetes, wt loss controlled with medicines, diet. C/o cough at times - COPD.... No dysphagia. No hiccups. F/u wt loss - gained wt F/u esoph tumor - doing well     Review of Systems  Constitutional: Negative for appetite change, fatigue and unexpected weight change.  HENT: Negative for nosebleeds, congestion, sore throat, sneezing, trouble swallowing and neck pain.   Eyes: Negative for itching and visual disturbance.  Respiratory: Negative for cough.   Cardiovascular: Negative for chest pain, palpitations and leg swelling.  Gastrointestinal: Negative for nausea, diarrhea, blood in stool and abdominal distention.  Genitourinary: Negative for frequency and hematuria.  Musculoskeletal: Negative for back pain, joint swelling and gait problem.  Skin: Negative for rash.  Neurological: Negative for dizziness, tremors, speech difficulty and weakness.  Psychiatric/Behavioral: Negative for suicidal ideas, sleep disturbance, dysphoric mood and agitation. The patient is not nervous/anxious.     Wt Readings from Last 3 Encounters:  10/16/12 207 lb (93.895 kg)  10/09/12 210 lb 1.6 oz (95.301 kg)  08/21/12 209 lb (94.802 kg)   BP Readings from Last 3 Encounters:  10/16/12 130/80  10/09/12 124/64  08/21/12 108/80        Objective:   Physical Exam  Constitutional: He is oriented to person, place, and time. He appears well-developed.  HENT:  Mouth/Throat: Oropharynx is clear and moist.  Eyes: Conjunctivae are normal. Pupils are equal, round, and reactive to light.  Neck: Normal range of motion. No JVD present. No thyromegaly present.  Cardiovascular: Normal rate, regular rhythm, normal heart sounds and intact distal pulses.  Exam reveals no gallop and no friction rub.   No murmur heard. Pulmonary/Chest: Effort normal and breath sounds normal. No respiratory distress.  He has no wheezes. He has no rales. He exhibits no tenderness.  Abdominal: Soft. Bowel sounds are normal. He exhibits no distension and no mass. There is no tenderness. There is no rebound and no guarding.  Musculoskeletal: Normal range of motion. He exhibits no edema and no tenderness.  Lymphadenopathy:    He has no cervical adenopathy.  Neurological: He is alert and oriented to person, place, and time. He has normal reflexes. No cranial nerve deficit. He exhibits normal muscle tone. Coordination normal.  Skin: Skin is warm and dry. No rash noted.  Psychiatric: He has a normal mood and affect. His behavior is normal. Judgment and thought content normal.  R wax  Lab Results  Component Value Date   WBC 4.4* 06/05/2011   HGB 12.9* 06/05/2011   HCT 38.6* 06/05/2011   PLT 238.0 06/05/2011   GLUCOSE 90 06/12/2012   CHOL 149 05/29/2010   TRIG 88.0 05/29/2010   HDL 51.50 05/29/2010   LDLCALC 80 05/29/2010   ALT 16 12/25/2010   AST 25 12/25/2010   NA 137 06/12/2012   K 4.4 06/12/2012   CL 100 06/12/2012   CREATININE 1.4 06/12/2012   BUN 17 06/12/2012   CO2 29 06/12/2012   TSH 2.01 08/14/2012   PSA 1.81 05/29/2010   INR 1.19 04/28/2009   HGBA1C 6.6* 12/25/2010     Procedure Note :     Procedure :  Ear irrigation   Indication:  Cerumen impaction R   Risks, including pain, dizziness, eardrum perforation, bleeding, infection and others as well as benefits were explained to the patient in detail. Verbal consent was obtained and the patient agreed to proceed.  We used "The Elephant Ear Irrigation Device" filled with lukewarm water for irrigation. A large amount wax was recovered. Procedure has also required manual wax removal with an ear loop.   Tolerated well. Complications: None.   Postprocedure instructions :  Call if problems.          Assessment & Plan:

## 2012-10-16 NOTE — Assessment & Plan Note (Signed)
On NAS diet  

## 2012-10-16 NOTE — Assessment & Plan Note (Signed)
Continue with current prescription therapy as reflected on the Med list.  

## 2012-10-16 NOTE — Assessment & Plan Note (Signed)
CBC

## 2012-10-16 NOTE — Assessment & Plan Note (Signed)
Labs

## 2012-11-21 ENCOUNTER — Encounter: Payer: Self-pay | Admitting: *Deleted

## 2012-11-27 ENCOUNTER — Ambulatory Visit (INDEPENDENT_AMBULATORY_CARE_PROVIDER_SITE_OTHER): Payer: Medicare Other | Admitting: Cardiovascular Disease

## 2012-11-27 ENCOUNTER — Encounter: Payer: Self-pay | Admitting: Cardiovascular Disease

## 2012-11-27 VITALS — BP 120/60 | HR 60 | Ht 75.0 in | Wt 209.0 lb

## 2012-11-27 DIAGNOSIS — I2510 Atherosclerotic heart disease of native coronary artery without angina pectoris: Secondary | ICD-10-CM

## 2012-11-27 DIAGNOSIS — E785 Hyperlipidemia, unspecified: Secondary | ICD-10-CM | POA: Diagnosis not present

## 2012-11-27 DIAGNOSIS — K219 Gastro-esophageal reflux disease without esophagitis: Secondary | ICD-10-CM | POA: Diagnosis not present

## 2012-11-27 DIAGNOSIS — I10 Essential (primary) hypertension: Secondary | ICD-10-CM | POA: Diagnosis not present

## 2012-11-27 NOTE — Progress Notes (Signed)
Patient ID: Aaron [name], male   DOB: 04/14/1935, 77 y.o.   MRN: [id number]     HPI: Aaron [name], is a 77 y.o. male presents to the office today for one-year cardiology evaluation.  Aaron [name] is now 77 years old. In June 2010 cardiac catheterization revealed mild CAD with 30% ostial LAD stenosis and 20% OM1 narrowing. Additional problems include hypertension with documented mild diastolic dysfunction, hyperlipidemia, and history of esophageal cancer, status post chemotherapy radiation therapy, history of hypothyroidism for which she is on thyroid replacement therapy. Over the past year, Aaron [name] has continued to do well. He is working part-time approximately 20 hours per week which does give him some exercise. He denies exercise induced chest tightness. He denies shortness of breath. He tells me Dr. Posey Rea will  be checking a complete set of laboratory next month. His last echo Doppler study was in April 2001 which showed an ejection fraction greater than 55%. Mitral valve E/A ratio was 0.71. Normal RV systolic pressures.  Past Medical History  Diagnosis Date  . Diverticulosis of colon   . Gastritis   . Hemorrhoids   . GERD (gastroesophageal reflux disease)   . Hiatal hernia   . GERD (gastroesophageal reflux disease)   . Hyperlipidemia     Dr Tresa Endo  . HTN (hypertension)     mild diastolic dysfunction   . Elevated glucose   . COPD (chronic obstructive pulmonary disease)   . 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 History of radiation therapy 05/03/09-06/16/09    squamous cell ca of esophagus  . Hypothyroid     s/p radiotherapy  . Arrhythmia   . Allergic rhinitis   . History of chemotherapy 05/03/09-06/16/09    Taxol/carboplatin  . CAD (coronary artery disease)     mild  . History of nuclear stress test 07/2008    exercise; mild-mod perfusion dfect in basal inf/mid inf/apical inf regions; EKG negative for ischemia    Past  Surgical History  Procedure Laterality Date  . Lower back cyst removed      [facility]  . Creation of cutaneous stoma  2011  . Tonsillectomy      age 68  . Esophagogastroduodenoscopy  10/03/2011    Procedure: ESOPHAGOGASTRODUODENOSCOPY (EGD);  Surgeon: Meryl Dare, MD,FACG;  Location: [facility];  Service: Endoscopy;  Laterality: N/A;  no fluro needed  . [name] dilation  10/03/2011    Procedure: [name] DILATION;  Surgeon: Meryl Dare, MD,FACG;  Location: [facility];  Service: Endoscopy;  Laterality: N/A;  . Cardiac catheterization  08/2008    mild nonobstructive CAD with 30% LAD stenosis prox & 20% OM1 narrowing   . Transthoracic echocardiogram  06/2009    EF=>55%; trace MR/TR    Allergies  Allergen Reactions  . Amoxicillin     REACTION: rash  . Cefuroxime Axetil     REACTION: nit feeling well  . Fluconazole     REACTION: shaking    Current Outpatient Prescriptions  Medication Sig Dispense Refill  . Aclidinium Bromide 400 MCG/ACT AEPB Inhale 1 Act into the lungs 2 (two) times daily.      Marland Kitchen aspirin 81 MG EC tablet Take 81 mg by mouth daily.        . baclofen (LIORESAL) 10 MG tablet Take 10 mg by mouth Daily.      . calcium carbonate (TUMS - DOSED IN MG ELEMENTAL CALCIUM) 500 MG chewable tablet  Chew 2 tablets by mouth 2 (two) times daily as needed. Acid reflux/gas      . Cholecalciferol 1000 UNITS tablet Take 1,000 Units by mouth daily.        . clonazePAM (KLONOPIN) 1 MG tablet Take 0.5-1 mg by mouth daily.      . fish oil-omega-3 fatty acids 1000 MG capsule Take 1 g by mouth daily.      Marland Kitchen guaiFENesin (MUCINEX) 600 MG 12 hr tablet Take 1,200 mg by mouth as needed for congestion.      . Ipratropium-Albuterol (COMBIVENT IN) Inhale into the lungs as needed.      Marland Kitchen levothyroxine (SYNTHROID, LEVOTHROID) 112 MCG tablet Take 1 tablet (112 mcg total) by mouth daily.  30 tablet  11  . loratadine (CLARITIN) 10 MG tablet Take 10 mg by mouth daily.       . Mometasone Furo-Formoterol  Fum (DULERA IN) Inhale into the lungs as needed.      Marland Kitchen omeprazole (PRILOSEC) 40 MG capsule Take 1 capsule (40 mg total) by mouth daily.  90 capsule  3  . simvastatin (ZOCOR) 20 MG tablet Take 1 tablet (20 mg total) by mouth daily.  90 tablet  3   No current facility-administered medications for this visit.    History   Social History  . Marital Status: Married    Spouse Name: N/A    Number of Children: 3  . Years of Education: 12th   Occupational History  . 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 Smoking status: Former Smoker -- 1.00 packs/day for 40 years    Quit date: 01/16/2001  . Smokeless tobacco: Never Used     Comment: Quit 2002  . Alcohol Use: 0.6 oz/week    1 Glasses of wine per week  . Drug Use: No     Comment: quit smoking 2002  . Sexual Activity: Yes   Other Topics Concern  . Not on file   Social History Narrative   Daily Caffeine - 1          Family History  Problem Relation Age of Onset  . Stroke Maternal Grandfather 35  . Prostate cancer Father     57's  . Diabetes Father   . Prostate cancer Brother 70  . Prostate cancer Brother 22  . Coronary artery disease Neg Hx   . Colon cancer Neg Hx   . Hypertension Neg Hx   . Stroke Mother     aneurism    ROS is negative for fevers, chills or night sweats. He denies presyncope or syncope. He denies palpitations. He denies shortness of breath. There is no chest pressure. He denies bleeding. He denies abdominal pain. There is no change in bowel or bladder habits. He denies significant edema. He denies myalgias or  Other system review is negative.  PE BP 120/60  Pulse 60  Ht 6\' 3"  (1.905 m)  Wt 209 lb (94.802 kg)  BMI 26.12 kg/m2  Repeat blood pressure by me was 112/64 supine and 102/60 standing. General: Alert, oriented, no distress.  Skin: normal turgor, no rashes HEENT: Normocephalic, atraumatic. Pupils round and reactive; sclera anicteric;no lid lag.    Nose without nasal septal hypertrophy Mouth/Parynx benign; Mallinpatti scale 2 Neck: No JVD, no carotid briuts Lungs: clear to ausculatation and percussion; no wheezing or rales Heart: RRR, s1 s2 normal 1/6 systolic murmur Abdomen: Very mild diastases recti; soft, nontender; no hepatosplenomehaly, BS+;  abdominal aorta nontender and not dilated by palpation. Pulses 2+ Extremities: no clubbing cyanosis or edema, Homan's sign negative  Neurologic: grossly nonfocal  ECG: Normal sinus rhythm at 60 beats per minute. Intervals are normal.  LABS:  BMET    Component Value Date/Time   NA 137 06/12/2012 1040   K 4.4 06/12/2012 1040   CL 100 06/12/2012 1040   CO2 29 06/12/2012 1040   GLUCOSE 90 06/12/2012 1040   GLUCOSE 100* 02/26/2006 1420   BUN 17 06/12/2012 1040   CREATININE 1.4 06/12/2012 1040   CALCIUM 9.7 06/12/2012 1040   GFRNONAA >60 08/15/2009 0634   GFRAA  Value: >60        The eGFR has been calculated using the MDRD equation. This calculation has not been validated in all clinical situations. eGFR's persistently <60 mL/min signify possible Chronic Kidney Disease. 08/15/2009 0634     Hepatic Function Panel     Component Value Date/Time   PROT 7.7 12/25/2010 0809   ALBUMIN 4.1 12/25/2010 0809   AST 25 12/25/2010 0809   ALT 16 12/25/2010 0809   ALKPHOS 95 12/25/2010 0809   BILITOT 0.6 12/25/2010 0809   BILIDIR 0.1 05/29/2010 0829     CBC    Component Value Date/Time   WBC 4.4* 06/05/2011 0755   WBC 5.6 07/06/2009 1058   RBC 4.30 06/05/2011 0755   RBC 3.45* 07/06/2009 1058   HGB 12.9* 06/05/2011 0755   HGB 10.4* 07/06/2009 1058   HCT 38.6* 06/05/2011 0755   HCT 30.5* 07/06/2009 1058   PLT 238.0 06/05/2011 0755   PLT 273 07/06/2009 1058   MCV 89.9 06/05/2011 0755   MCV 88.5 07/06/2009 1058   MCH 30.1 07/06/2009 1058   MCHC 33.3 06/05/2011 0755   MCHC 34.0 07/06/2009 1058   RDW 14.0 06/05/2011 0755   RDW 19.5* 07/06/2009 1058   LYMPHSABS 1.1 06/05/2011 0755   LYMPHSABS 0.7* 07/06/2009 1058    MONOABS 0.6 06/05/2011 0755   MONOABS 0.4 07/06/2009 1058   EOSABS 0.2 06/05/2011 0755   EOSABS 0.3 07/06/2009 1058   BASOSABS 0.0 06/05/2011 0755   BASOSABS 0.0 07/06/2009 1058     BNP No results found for this basename: probnp    Lipid Panel     Component Value Date/Time   CHOL 149 05/29/2010 0829   TRIG 88.0 05/29/2010 0829   HDL 51.50 05/29/2010 0829   CHOLHDL 3 05/29/2010 0829   VLDL 17.6 05/29/2010 0829   LDLCALC 80 05/29/2010 0829     RADIOLOGY: No results found.    ASSESSMENT AND PLAN: Mr. Thrun continues to do exceptionally well on his current medical therapy. His blood pressure today is well controlled. He denies recent chest pain. He does have mild nonobstructive CAD. I reviewed the laboratory when completed by Dr Posey Rea. He denies shortness of breath. ECG is stable. I will see him in one year for followup evaluation or sooner if problems arise.     Lennette Bihari, MD, [facility]  11/27/2012 11:10 AM

## 2012-11-27 NOTE — Patient Instructions (Signed)
Your physician recommends that you schedule a follow-up appointment in: 1 YEAR. No changes were made today. 

## 2012-12-19 DIAGNOSIS — Z23 Encounter for immunization: Secondary | ICD-10-CM | POA: Diagnosis not present

## 2012-12-25 ENCOUNTER — Ambulatory Visit: Payer: Medicare Other | Admitting: Internal Medicine

## 2012-12-25 DIAGNOSIS — H04209 Unspecified epiphora, unspecified lacrimal gland: Secondary | ICD-10-CM | POA: Diagnosis not present

## 2012-12-25 DIAGNOSIS — H35379 Puckering of macula, unspecified eye: Secondary | ICD-10-CM | POA: Diagnosis not present

## 2012-12-25 DIAGNOSIS — H1045 Other chronic allergic conjunctivitis: Secondary | ICD-10-CM | POA: Diagnosis not present

## 2012-12-25 DIAGNOSIS — H2510 Age-related nuclear cataract, unspecified eye: Secondary | ICD-10-CM | POA: Diagnosis not present

## 2012-12-25 DIAGNOSIS — H40029 Open angle with borderline findings, high risk, unspecified eye: Secondary | ICD-10-CM | POA: Diagnosis not present

## 2012-12-25 DIAGNOSIS — H01009 Unspecified blepharitis unspecified eye, unspecified eyelid: Secondary | ICD-10-CM | POA: Diagnosis not present

## 2012-12-25 DIAGNOSIS — H43399 Other vitreous opacities, unspecified eye: Secondary | ICD-10-CM | POA: Diagnosis not present

## 2013-01-01 IMAGING — CR DG CHEST 2V
3 series · 3 of 3 positions shown · non-contrast
Comparison: Previous examinations.

CLINICAL DATA: Cough. Hiccoughs.  History of esophageal cancer.

CHEST - 2 VIEW

[view not recorded (1 of 3)]
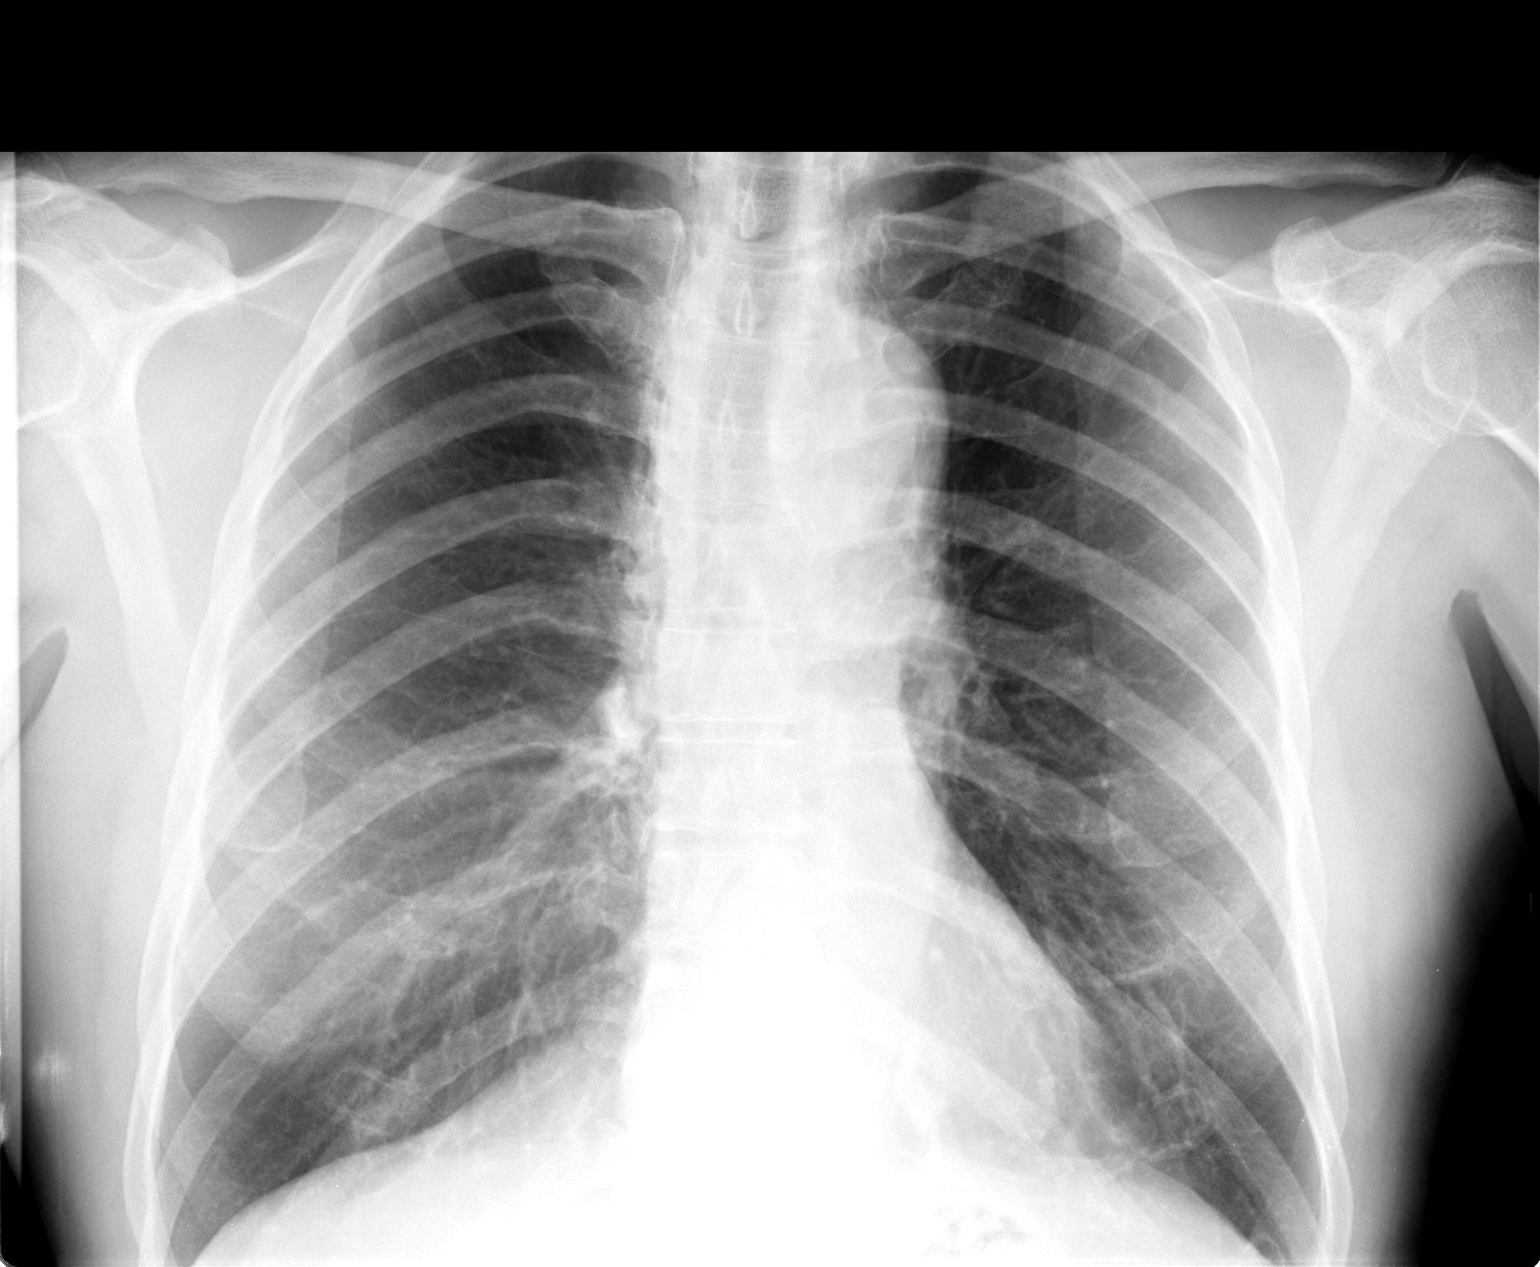

[view not recorded (2 of 3)]
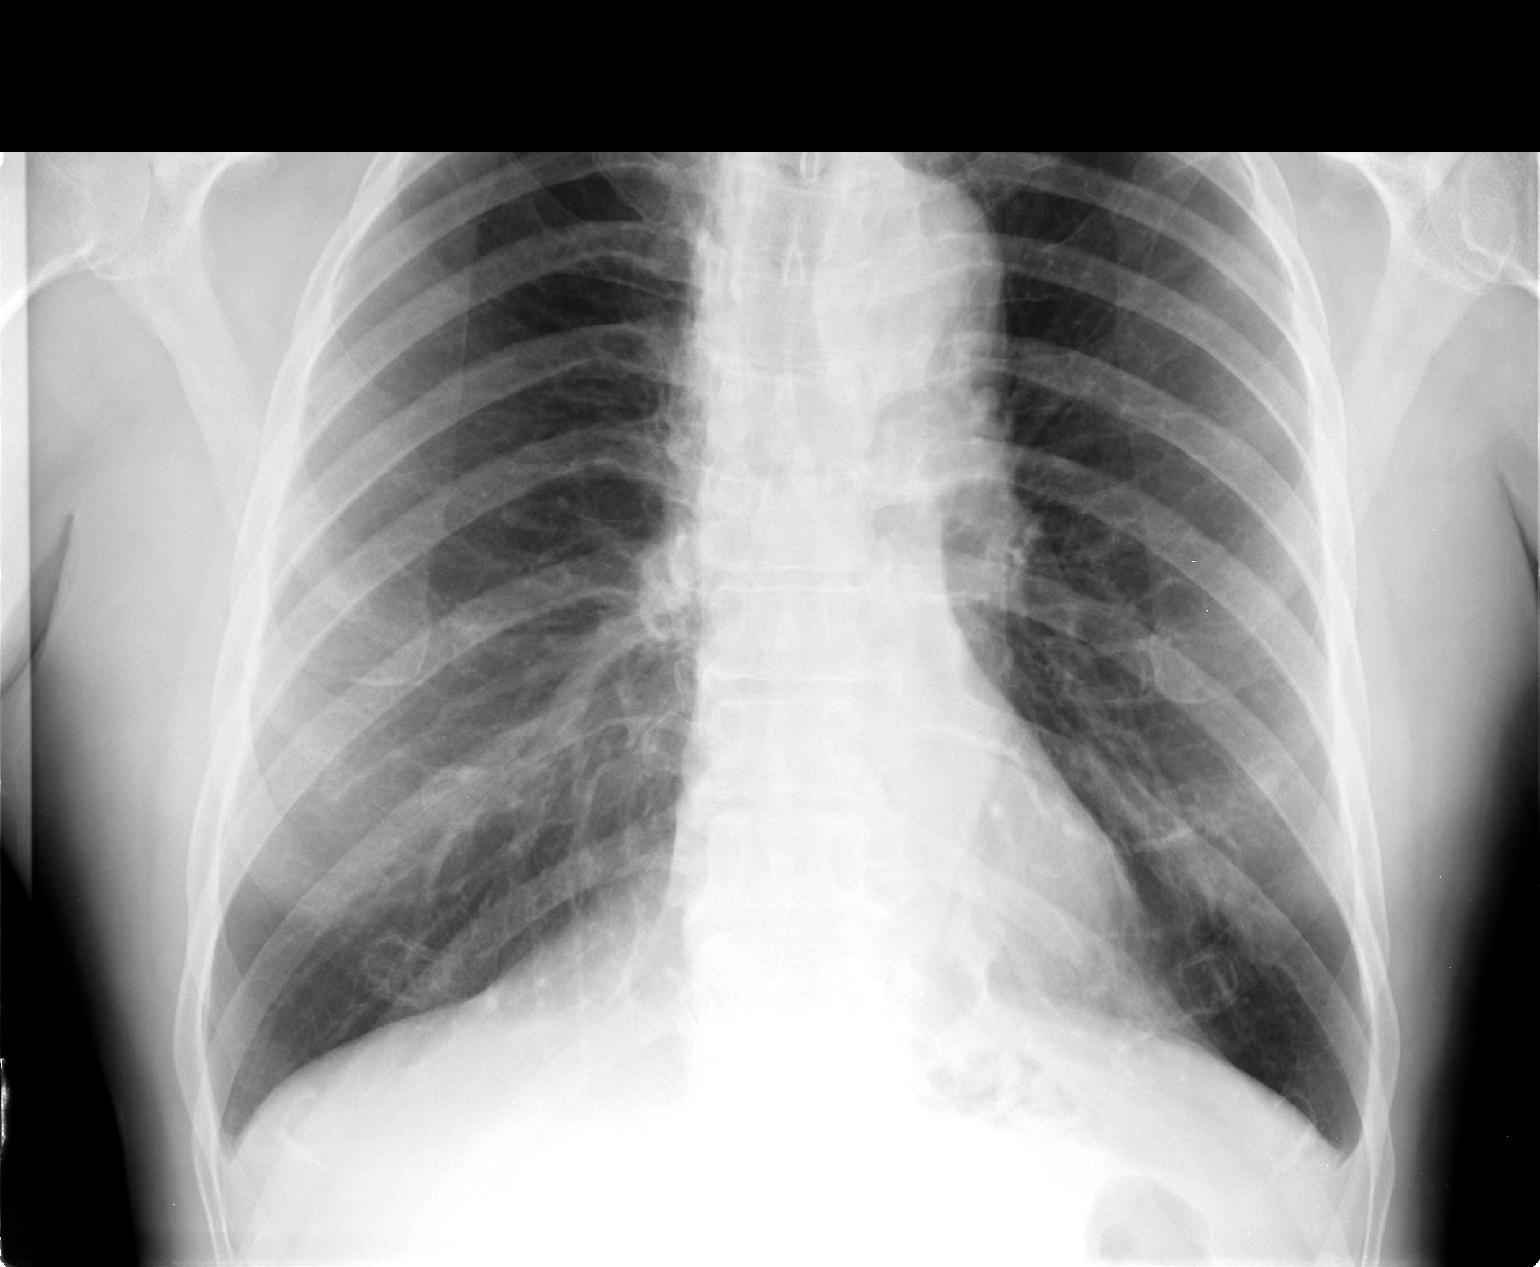

[view not recorded (3 of 3)]
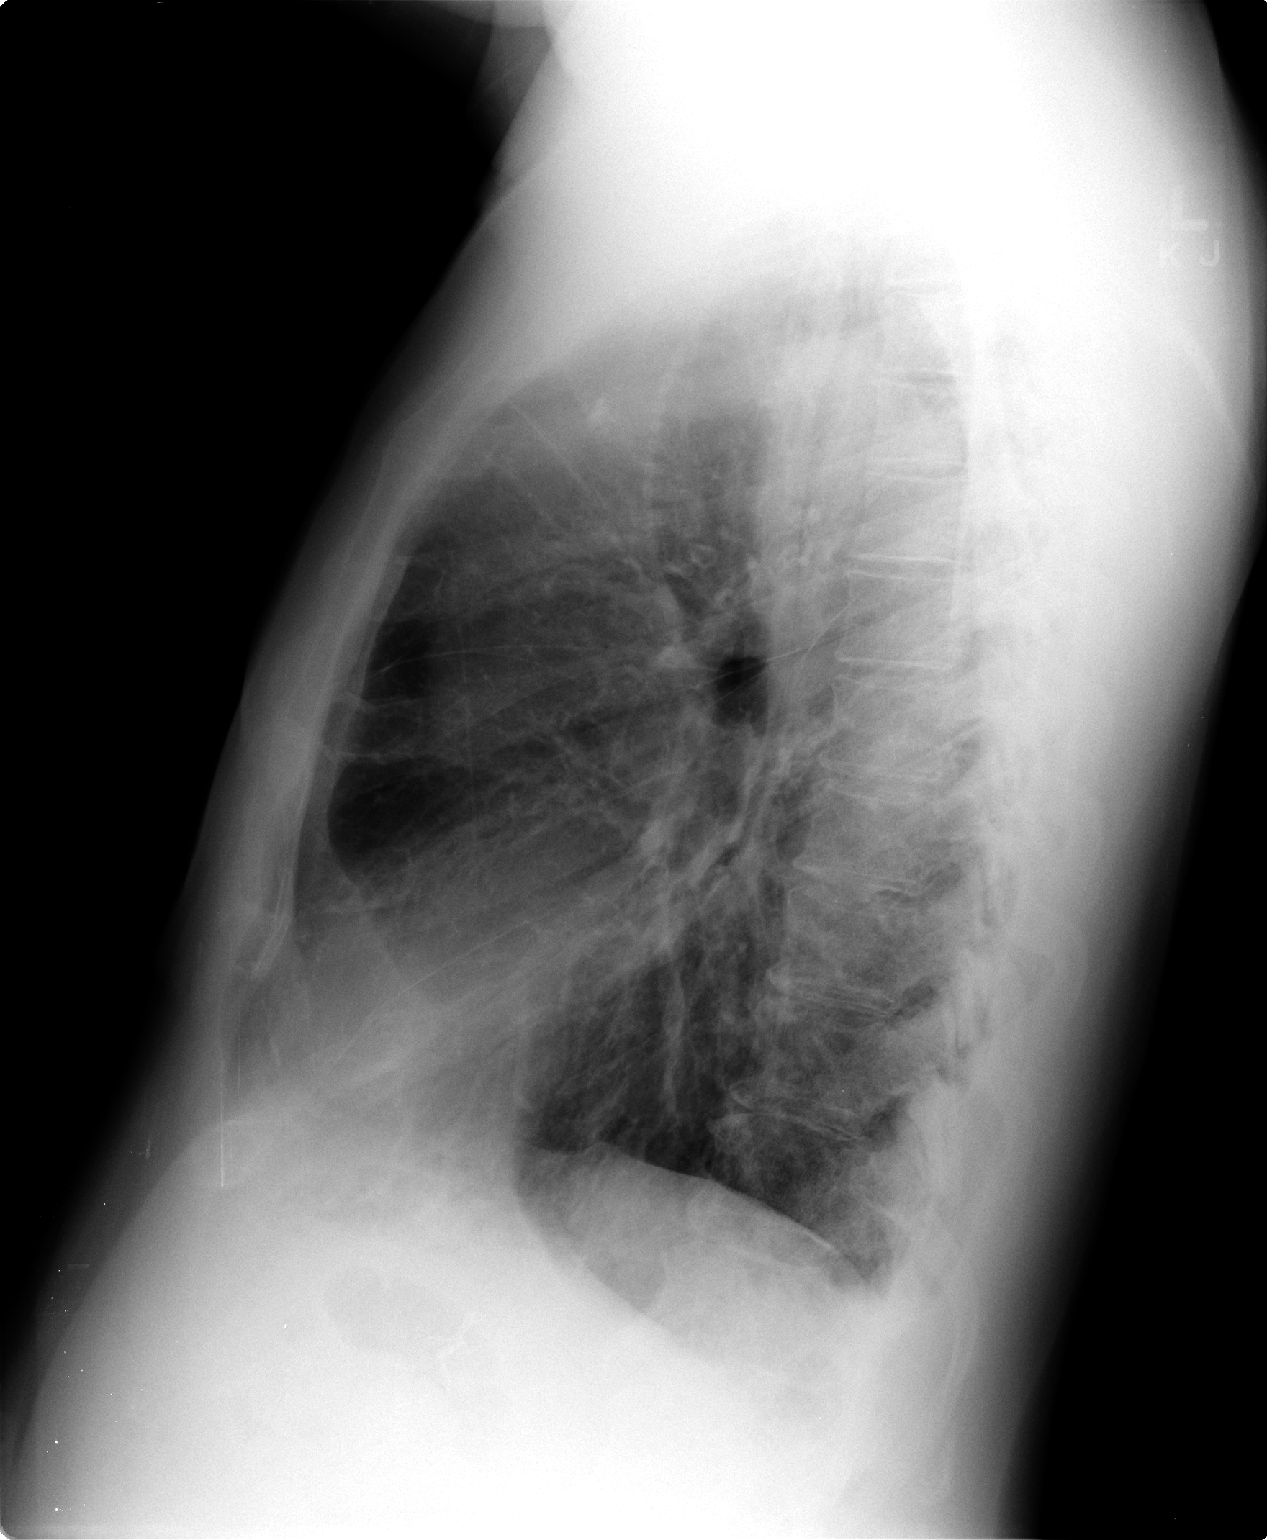

[3 of 3 positions shown; findings below may reference images not displayed]

FINDINGS: The heart remains normal in size.  The lungs remain clear
and hyperexpanded.  Left nipple shadow without significant change
since 06/03/2009.  Mild thoracic spine degenerative changes.
IMPRESSION: Stable changes of COPD.  No acute abnormality.

## 2013-01-15 ENCOUNTER — Encounter: Payer: Medicare Other | Admitting: Internal Medicine

## 2013-01-22 ENCOUNTER — Encounter: Payer: Self-pay | Admitting: Internal Medicine

## 2013-01-22 ENCOUNTER — Ambulatory Visit (INDEPENDENT_AMBULATORY_CARE_PROVIDER_SITE_OTHER): Payer: Medicare Other | Admitting: Internal Medicine

## 2013-01-22 VITALS — BP 130/80 | HR 80 | Temp 97.7°F | Resp 16 | Ht 76.0 in | Wt 210.0 lb

## 2013-01-22 DIAGNOSIS — J449 Chronic obstructive pulmonary disease, unspecified: Secondary | ICD-10-CM

## 2013-01-22 DIAGNOSIS — E039 Hypothyroidism, unspecified: Secondary | ICD-10-CM | POA: Diagnosis not present

## 2013-01-22 DIAGNOSIS — Z0000 Encounter for general adult medical examination without abnormal findings: Secondary | ICD-10-CM | POA: Diagnosis not present

## 2013-01-22 DIAGNOSIS — I10 Essential (primary) hypertension: Secondary | ICD-10-CM

## 2013-01-22 DIAGNOSIS — D485 Neoplasm of uncertain behavior of skin: Secondary | ICD-10-CM | POA: Insufficient documentation

## 2013-01-22 DIAGNOSIS — K219 Gastro-esophageal reflux disease without esophagitis: Secondary | ICD-10-CM

## 2013-01-22 DIAGNOSIS — Z23 Encounter for immunization: Secondary | ICD-10-CM

## 2013-01-22 NOTE — Progress Notes (Signed)
   Subjective:    HPI  He appears well, in no apparent distress.  Alert and oriented times three, pleasant and cooperative. Vital signs are as documented in vital signs section.  F/u COPD.... No dysphagia. No hiccups. F/u wt loss - gained wt F/u esoph tumor - doing well     Review of Systems  Constitutional: Negative for appetite change, fatigue and unexpected weight change.  HENT: Negative for congestion, nosebleeds, sneezing, sore throat and trouble swallowing.   Eyes: Negative for itching and visual disturbance.  Respiratory: Negative for cough.   Cardiovascular: Negative for chest pain, palpitations and leg swelling.  Gastrointestinal: Negative for nausea, diarrhea, blood in stool and abdominal distention.  Genitourinary: Negative for frequency and hematuria.  Musculoskeletal: Negative for back pain, gait problem, joint swelling and neck pain.  Skin: Negative for rash.  Neurological: Negative for dizziness, tremors, speech difficulty and weakness.  Psychiatric/Behavioral: Negative for suicidal ideas, sleep disturbance, dysphoric mood and agitation. The patient is not nervous/anxious.     Wt Readings from Last 3 Encounters:  01/22/13 210 lb (95.255 kg)  11/27/12 209 lb (94.802 kg)  10/16/12 207 lb (93.895 kg)   BP Readings from Last 3 Encounters:  01/22/13 130/80  11/27/12 120/60  10/16/12 130/80        Objective:   Physical Exam  Constitutional: He is oriented to person, place, and time. He appears well-developed.  HENT:  Mouth/Throat: Oropharynx is clear and moist.  Eyes: Conjunctivae are normal. Pupils are equal, round, and reactive to light.  Neck: Normal range of motion. No JVD present. No thyromegaly present.  Cardiovascular: Normal rate, regular rhythm, normal heart sounds and intact distal pulses.  Exam reveals no gallop and no friction rub.   No murmur heard. Pulmonary/Chest: Effort normal and breath sounds normal. No respiratory distress. He has no  wheezes. He has no rales. He exhibits no tenderness.  Abdominal: Soft. Bowel sounds are normal. He exhibits no distension and no mass. There is no tenderness. There is no rebound and no guarding.  Musculoskeletal: Normal range of motion. He exhibits no edema and no tenderness.  Lymphadenopathy:    He has no cervical adenopathy.  Neurological: He is alert and oriented to person, place, and time. He has normal reflexes. No cranial nerve deficit. He exhibits normal muscle tone. Coordination normal.  Skin: Skin is warm and dry. No rash noted.  Psychiatric: He has a normal mood and affect. His behavior is normal. Judgment and thought content normal.    Lab Results  Component Value Date   WBC 4.4* 06/05/2011   HGB 12.9* 06/05/2011   HCT 38.6* 06/05/2011   PLT 238.0 06/05/2011   GLUCOSE 90 06/12/2012   CHOL 149 05/29/2010   TRIG 88.0 05/29/2010   HDL 51.50 05/29/2010   LDLCALC 80 05/29/2010   ALT 16 12/25/2010   AST 25 12/25/2010   NA 137 06/12/2012   K 4.4 06/12/2012   CL 100 06/12/2012   CREATININE 1.4 06/12/2012   BUN 17 06/12/2012   CO2 29 06/12/2012   TSH 2.01 08/14/2012   PSA 1.81 05/29/2010   INR 1.19 04/28/2009   HGBA1C 6.6* 12/25/2010            Assessment & Plan:

## 2013-01-22 NOTE — Assessment & Plan Note (Signed)
Continue with current prescription therapy as reflected on the Med list.  

## 2013-01-22 NOTE — Assessment & Plan Note (Signed)

## 2013-01-22 NOTE — Assessment & Plan Note (Signed)
10/14 face and neck Skin bx

## 2013-01-29 ENCOUNTER — Other Ambulatory Visit (INDEPENDENT_AMBULATORY_CARE_PROVIDER_SITE_OTHER): Payer: Medicare Other

## 2013-01-29 DIAGNOSIS — R7309 Other abnormal glucose: Secondary | ICD-10-CM

## 2013-01-29 DIAGNOSIS — C159 Malignant neoplasm of esophagus, unspecified: Secondary | ICD-10-CM | POA: Diagnosis not present

## 2013-01-29 DIAGNOSIS — I10 Essential (primary) hypertension: Secondary | ICD-10-CM

## 2013-01-29 DIAGNOSIS — N320 Bladder-neck obstruction: Secondary | ICD-10-CM

## 2013-01-29 DIAGNOSIS — Z125 Encounter for screening for malignant neoplasm of prostate: Secondary | ICD-10-CM

## 2013-01-29 DIAGNOSIS — E039 Hypothyroidism, unspecified: Secondary | ICD-10-CM

## 2013-01-29 DIAGNOSIS — Z0000 Encounter for general adult medical examination without abnormal findings: Secondary | ICD-10-CM

## 2013-01-29 DIAGNOSIS — R1319 Other dysphagia: Secondary | ICD-10-CM | POA: Diagnosis not present

## 2013-01-29 DIAGNOSIS — D509 Iron deficiency anemia, unspecified: Secondary | ICD-10-CM

## 2013-01-29 LAB — CBC WITH DIFFERENTIAL/PLATELET
Eosinophils Relative: 6.5 % — ABNORMAL HIGH (ref 0.0–5.0)
HCT: 39.7 % (ref 39.0–52.0)
Hemoglobin: 13.4 g/dL (ref 13.0–17.0)
Lymphs Abs: 1.1 10*3/uL (ref 0.7–4.0)
MCV: 85.8 fl (ref 78.0–100.0)
Monocytes Absolute: 0.5 10*3/uL (ref 0.1–1.0)
Monocytes Relative: 11 % (ref 3.0–12.0)
Neutro Abs: 2.7 10*3/uL (ref 1.4–7.7)
RDW: 14.5 % (ref 11.5–14.6)

## 2013-01-29 LAB — HEPATIC FUNCTION PANEL
Bilirubin, Direct: 0.1 mg/dL (ref 0.0–0.3)
Total Bilirubin: 0.4 mg/dL (ref 0.3–1.2)

## 2013-01-29 LAB — BASIC METABOLIC PANEL
CO2: 29 mEq/L (ref 19–32)
Calcium: 9.8 mg/dL (ref 8.4–10.5)
Creatinine, Ser: 1.4 mg/dL (ref 0.4–1.5)
Glucose, Bld: 102 mg/dL — ABNORMAL HIGH (ref 70–99)

## 2013-01-29 LAB — URINALYSIS
Bilirubin Urine: NEGATIVE
Ketones, ur: NEGATIVE
Leukocytes, UA: NEGATIVE
Nitrite: NEGATIVE
Urobilinogen, UA: 0.2 (ref 0.0–1.0)

## 2013-01-29 LAB — IBC PANEL
Iron: 112 ug/dL (ref 42–165)
Saturation Ratios: 29.4 % (ref 20.0–50.0)
Transferrin: 271.8 mg/dL (ref 212.0–360.0)

## 2013-01-29 LAB — LIPID PANEL: Total CHOL/HDL Ratio: 3

## 2013-01-29 LAB — PSA: PSA: 3.28 ng/mL (ref 0.10–4.00)

## 2013-01-29 LAB — TSH: TSH: 1.87 u[IU]/mL (ref 0.35–5.50)

## 2013-01-29 LAB — HEMOGLOBIN A1C: Hgb A1c MFr Bld: 6.9 % — ABNORMAL HIGH (ref 4.6–6.5)

## 2013-01-30 ENCOUNTER — Telehealth: Payer: Self-pay

## 2013-01-30 NOTE — Telephone Encounter (Signed)
Patient informed of results.  

## 2013-01-30 NOTE — Telephone Encounter (Signed)
Message copied by Bethann Punches E on Fri Jan 30, 2013  9:11 AM ------      Message from: Tresa Garter      Created: Thu Jan 29, 2013  7:39 PM       Misty Stanley, please, inform patient that all labs are OK      Thank you!       ------

## 2013-03-28 ENCOUNTER — Other Ambulatory Visit: Payer: Self-pay | Admitting: Internal Medicine

## 2013-04-09 ENCOUNTER — Ambulatory Visit (HOSPITAL_BASED_OUTPATIENT_CLINIC_OR_DEPARTMENT_OTHER): Payer: Medicare Other | Admitting: Nurse Practitioner

## 2013-04-09 ENCOUNTER — Telehealth: Payer: Self-pay | Admitting: Oncology

## 2013-04-09 VITALS — BP 112/68 | HR 94 | Temp 98.0°F | Resp 18 | Ht 76.0 in | Wt 211.1 lb

## 2013-04-09 DIAGNOSIS — C159 Malignant neoplasm of esophagus, unspecified: Secondary | ICD-10-CM

## 2013-04-09 DIAGNOSIS — K1370 Unspecified lesions of oral mucosa: Secondary | ICD-10-CM | POA: Diagnosis not present

## 2013-04-09 DIAGNOSIS — I10 Essential (primary) hypertension: Secondary | ICD-10-CM

## 2013-04-09 NOTE — Telephone Encounter (Signed)
gave pt appt for MD on July, talked to [facility] @ Dr. [name] office , pt's case will be reviewed and they will call pt, gave pt telephone numberr to Dr. [name]

## 2013-04-09 NOTE — Progress Notes (Addendum)
OFFICE PROGRESS NOTE  Interval history:  Aaron [name] returns for followup of esophagus cancer. He feels well. No dysphagia. He reports a good appetite. He is tolerating a regular diet. He works 4-6 hours per day 4-5 days a week. No interim illnesses or infections. He occasionally notes a "crampy" sensation at the left neck when he yawns.   Objective: Filed Vitals:   04/09/13 1015  BP: 112/68  Pulse: 94  Temp: 98 F (36.7 C)  Resp: 18   Mucosal irregularity/white patch at the right posterior buccal mucosa, raised soft tissue at the right lower posterior gumline-smooth. No palpable cervical, supraclavicular, axillary or inguinal lymph nodes. Lungs are clear. Regular cardiac rhythm. Abdomen soft and nontender. No hepatomegaly. No leg edema.   Lab Results: Lab Results  Component Value Date   WBC 4.6 01/29/2013   HGB 13.4 01/29/2013   HCT 39.7 01/29/2013   MCV 85.8 01/29/2013   PLT 260.0 01/29/2013   NEUTROABS 2.7 01/29/2013    Chemistry:    Chemistry      Component Value Date/Time   NA 138 01/29/2013 0957   K 4.5 01/29/2013 0957   CL 102 01/29/2013 0957   CO2 29 01/29/2013 0957   BUN 14 01/29/2013 0957   CREATININE 1.4 01/29/2013 0957      Component Value Date/Time   CALCIUM 9.8 01/29/2013 0957   ALKPHOS 66 01/29/2013 0957   AST 25 01/29/2013 0957   ALT 11 01/29/2013 0957   BILITOT 0.4 01/29/2013 0957       Studies/Results: No results found.  Medications: I have reviewed the patient's current medications.  Assessment/Plan: 1. Squamous cell carcinoma of the esophagus, status post an endoscopic biopsy, 04/07/2009, with the pathology confirming at least superficial invasion. 2. PET scan, 04/20/2009, confirmed hypermetabolic activity associated with the upper and thoracic esophagus mass with a single hypermetabolic right paratracheal lymph node. He began concurrent radiation and weekly Taxol/carboplatin chemotherapy, 05/03/2009. The last treatment with Taxol/carboplatin was given  06/14/2009. He completed radiation on 06/16/2009.  3. Solid/liquid dysphagia secondary to the proximal esophageal mass, resolved. He is status post an esophageal dilatation for management of a stricture, 08/24/2009, repeat esophageal dilatation 10/03/2011. 4. Borderline enlarged mediastinal and gastrohepatic lymph nodes on staging CT scans. PET scan 04/20/2009 confirmed hypermetabolic activity associated with the right paratracheal lymph node. 5. Hypertension. 6. Status post placement of a feeding jejunostomy tube by Dr. Johney Maine. The jejunostomy tube has been removed. 7. Admission with neutropenia and anemia, March 2011.  8. Fever of unknown origin, April 2011, resolved. He was treated with prednisone beginning on 07/13/2009 for presumed radiation pneumonitis. He developed episodes of rigors on prednisone, and the prednisone was discontinued, 07/15/2009. A CT scan did not reveal evidence for an infectious process. 9. Zoster rash at the right back and anterior chest July 2013. 10. Question leukoplakia right posterior buccal mucosa.   Dispositon-he remains in clinical remission from esophagus cancer. He will return for a followup visit in 6 months.  Dr. Benay Spice recommends a referral to Dr. [name] to evaluate the area of irregularity at the right posterior buccal mucosa, question leukoplakia.  Patient seen with Dr. Benay Spice.   Aaron [name] ANP/GNP-BC   This was a shared visit with Aaron [name]. He remains in clinical remission from esophagus cancer. There is irregularity at the right posterior buccal mucosa with soft smooth raised tissue at the right posterior lower gumline. We will refer him for a dental evaluation.  Aaron [name], M.D.

## 2013-04-16 ENCOUNTER — Ambulatory Visit (HOSPITAL_COMMUNITY): Payer: Self-pay | Admitting: Dentistry

## 2013-04-16 ENCOUNTER — Encounter (HOSPITAL_COMMUNITY): Payer: Self-pay | Admitting: Dentistry

## 2013-04-16 VITALS — BP 120/80 | HR 80 | Temp 97.6°F

## 2013-04-16 DIAGNOSIS — K000 Anodontia: Secondary | ICD-10-CM

## 2013-04-16 DIAGNOSIS — K068 Other specified disorders of gingiva and edentulous alveolar ridge: Secondary | ICD-10-CM

## 2013-04-16 DIAGNOSIS — Z9221 Personal history of antineoplastic chemotherapy: Secondary | ICD-10-CM

## 2013-04-16 DIAGNOSIS — K08109 Complete loss of teeth, unspecified cause, unspecified class: Secondary | ICD-10-CM

## 2013-04-16 DIAGNOSIS — Z972 Presence of dental prosthetic device (complete) (partial): Secondary | ICD-10-CM

## 2013-04-16 DIAGNOSIS — Z09 Encounter for follow-up examination after completed treatment for conditions other than malignant neoplasm: Secondary | ICD-10-CM

## 2013-04-16 DIAGNOSIS — Z8501 Personal history of malignant neoplasm of esophagus: Secondary | ICD-10-CM

## 2013-04-16 DIAGNOSIS — Z923 Personal history of irradiation: Secondary | ICD-10-CM

## 2013-04-16 DIAGNOSIS — K1329 Other disturbances of oral epithelium, including tongue: Secondary | ICD-10-CM

## 2013-04-16 DIAGNOSIS — K0889 Other specified disorders of teeth and supporting structures: Secondary | ICD-10-CM

## 2013-04-16 NOTE — Patient Instructions (Signed)
Suggested referral to oral surgeon (Dr. Benson Norway) for evaluation of soft tissue lesions with biopsy as needed. I also suggested evaluation for new upper and lower complete dentures with Dr. Osa Craver.  Dr. [name]

## 2013-04-16 NOTE — Progress Notes (Signed)
DENTAL CONSULTATION  Date of Consultation:  04/16/2013 Patient Name:   Aaron [name] Date of Birth:   02/13/1936 Medical Record Number: [id number]  VITALS: BP 120/80  Pulse 80  Temp(Src) 97.6 F (36.4 C) (Oral)   CHIEF COMPLAINT: The patient was referred by Dr. Sherrill for evaluation of soft tissue lesions.  HPI: Aaron [name] is a 77-year-old male with history of esophageal cancer and is status post chemotherapy and radiation therapy. Patient was recently seen by Dr. Sherrill and several abnormal soft tissue areas were noted. The patient was then referred to Dental medicine for evaluation of the soft tissue lesions.  The patient is edentulous. The patient has had upper and lower complete dentures for 20-30 years. Patient was last seen by a DDS for fabrication of those dentures. This was performed by Dr. David Mandalinich.  The patient indicates that the upper and lower complete dentures " fit okay".  The patient indicates that the area of concern involving the mandibular right soft tissue did not bother him. Patient is not sure how long the soft tissue abnormalities have been that way.   PROBLEM LIST: Patient Active Problem List   Diagnosis Date Noted  . Well adult exam 01/22/2013  . Neoplasm of uncertain behavior of skin 01/22/2013  . CAD (coronary artery disease) 11/27/2012  . Cerumen impaction 10/16/2012  . Angular stomatitis 03/13/2012  . Esophageal cancer   . Arrhythmia   . Knee pain, left 12/27/2010  . HYPOTHYROIDISM 06/02/2010  . Keloid scar 06/02/2010  . COPD 02/24/2010  . COUGH 02/24/2010  . SEBACEOUS CYST, INFECTED 12/20/2009  . INVOLUNTARY MOVEMENTS, ABNORMAL 08/17/2009  . Malignant neoplasm of thoracic esophagus 06/13/2009  . ANEMIA, IRON DEFICIENCY 06/13/2009  . FEVER UNSPECIFIED 06/13/2009  . Stricture and stenosis of esophagus 04/06/2009  . DYSPHAGIA 04/06/2009  . TOBACCO USE, QUIT 03/01/2009  . RASH-NONVESICULAR 03/23/2008  . ALLERGIC RHINITIS  03/04/2008  . CRAMPS,LEG 09/04/2007  . ABNORMAL GLUCOSE NEC 09/04/2007  . SINUSITIS, ACUTE 02/28/2007  . GERD 02/28/2007  . HYPERLIPIDEMIA 01/27/2007  . HYPERTENSION 01/27/2007  . DIVERTICULOSIS, COLON 01/27/2007    PMH: Past Medical History  Diagnosis Date  . Diverticulosis of colon   . Gastritis   . Hemorrhoids   . GERD (gastroesophageal reflux disease)   . Hiatal hernia   . GERD (gastroesophageal reflux disease)   . Hyperlipidemia     Dr Kelly  . HTN (hypertension)     mild diastolic dysfunction   . Elevated glucose   . COPD (chronic obstructive pulmonary disease)   . 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 History of radiation therapy 05/03/09-06/16/09    squamous cell ca of esophagus  . Hypothyroid     s/p radiotherapy  . Arrhythmia   . Allergic rhinitis   . History of chemotherapy 05/03/09-06/16/09    Taxol/carboplatin  . CAD (coronary artery disease)     mild  . History of nuclear stress test 07/2008    exercise; mild-mod perfusion dfect in basal inf/mid inf/apical inf regions; EKG negative for ischemia    PSH: Past Surgical History  Procedure Laterality Date  . Lower back cyst removed      [facility]  . Creation of cutaneous stoma  2011  . Tonsillectomy      age 14  . Esophagogastroduodenoscopy  10/03/2011    Procedure: ESOPHAGOGASTRODUODENOSCOPY (EGD);  Surgeon: Malcolm T Stark, MD,FACG;  Location: [facility];  Service: Endoscopy;    Laterality: N/A;  no fluro needed  . [name] dilation  10/03/2011    Procedure: [name] DILATION;  Surgeon: [name], MD,FACG;  Location: [facility];  Service: Endoscopy;  Laterality: N/A;  . Cardiac catheterization  08/2008    mild nonobstructive CAD with 30% LAD stenosis prox & 20% OM1 narrowing   . Transthoracic echocardiogram  06/2009    EF=>55%; trace MR/TR    ALLERGIES: Allergies  Allergen Reactions  . Amoxicillin     REACTION: rash  . Cefuroxime Axetil     REACTION:  nit feeling well  . Fluconazole     REACTION: shaking    MEDICATIONS: Current Outpatient Prescriptions  Medication Sig Dispense Refill  . Aclidinium Bromide 400 MCG/ACT AEPB Inhale 1 Act into the lungs 2 (two) times daily.      Marland Kitchen aspirin 81 MG EC tablet Take 81 mg by mouth daily.        . baclofen (LIORESAL) 10 MG tablet Take 10 mg by mouth Daily.      . calcium carbonate (TUMS - DOSED IN MG ELEMENTAL CALCIUM) 500 MG chewable tablet Chew 2 tablets by mouth 2 (two) times daily as needed. Acid reflux/gas      . Cholecalciferol 1000 UNITS tablet Take 1,000 Units by mouth daily.        . clonazePAM (KLONOPIN) 1 MG tablet TAKE 1/2 TO 1 TABLET BY MOUTH TWICE DAILY AS NEEDED FOR ANXIETY  60 tablet  0  . fish oil-omega-3 fatty acids 1000 MG capsule Take 1 g by mouth daily.      Marland Kitchen guaiFENesin (MUCINEX) 600 MG 12 hr tablet Take 1,200 mg by mouth as needed for congestion.      . Ipratropium-Albuterol (COMBIVENT IN) Inhale into the lungs as needed.      Marland Kitchen levothyroxine (SYNTHROID, LEVOTHROID) 112 MCG tablet Take 1 tablet (112 mcg total) by mouth daily.  30 tablet  11  . loratadine (CLARITIN) 10 MG tablet Take 10 mg by mouth daily.       . Mometasone Furo-Formoterol Fum (DULERA IN) Inhale into the lungs as needed.      Marland Kitchen omeprazole (PRILOSEC) 40 MG capsule Take 1 capsule (40 mg total) by mouth daily.  90 capsule  3  . simvastatin (ZOCOR) 20 MG tablet Take 1 tablet (20 mg total) by mouth daily.  90 tablet  3   No current facility-administered medications for this visit.    LABS: Lab Results  Component Value Date   WBC 4.6 01/29/2013   HGB 13.4 01/29/2013   HCT 39.7 01/29/2013   MCV 85.8 01/29/2013   PLT 260.0 01/29/2013      Component Value Date/Time   NA 138 01/29/2013 0957   K 4.5 01/29/2013 0957   CL 102 01/29/2013 0957   CO2 29 01/29/2013 0957   GLUCOSE 102* 01/29/2013 0957   GLUCOSE 100* 02/26/2006 1420   BUN 14 01/29/2013 0957   CREATININE 1.4 01/29/2013 0957   CALCIUM 9.8 01/29/2013 0957    GFRNONAA >60 08/15/2009 0634   GFRAA  Value: >60        The eGFR has been calculated using the MDRD equation. This calculation has not been validated in all clinical situations. eGFR's persistently <60 mL/min signify possible Chronic Kidney Disease. 08/15/2009 7628   Lab Results  Component Value Date   INR 1.19 04/28/2009   INR 1.3 ratio* 04/07/2009   No results found for this basename: PTT    SOCIAL HISTORY: History   Social History  .  Marital Status: Married    Spouse Name: N/A    Number of Children: 3  . Years of Education: 12th   Occupational History  . 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 Smoking status: Former Smoker -- 1.00 packs/day for 40 years    Quit date: 01/16/2001  . Smokeless tobacco: Never Used     Comment: Quit 2002  . Alcohol Use: 0.6 oz/week    1 Glasses of wine per week  . Drug Use: No     Comment: quit smoking 2002  . Sexual Activity: Yes   Other Topics Concern  . Not on file   Social History Narrative   Daily Caffeine - 1          FAMILY HISTORY: Family History  Problem Relation Age of Onset  . Stroke Maternal Grandfather 35  . Prostate cancer Father     39's  . Diabetes Father   . Prostate cancer Brother 18  . Prostate cancer Brother 58  . Coronary artery disease Neg Hx   . Colon cancer Neg Hx   . Hypertension Neg Hx   . Stroke Mother     aneurism   DENTAL HISTORY: CHIEF COMPLAINT: The patient was referred by Dr. Benay Spice for evaluation of soft tissue lesions.  HPI: Catarino Vold is a 78 year old male with history of esophageal cancer and is status post chemotherapy and radiation therapy. Patient was recently seen by Dr. Benay Spice and several abnormal soft tissue areas were noted. The patient was then referred to Dental medicine for evaluation of the soft tissue lesions.  The patient is edentulous. The patient has had upper and lower complete dentures for 20-30 years. Patient was last  seen by a DDS for fabrication of those dentures. This was performed by Dr. Demetrius Revel.  The patient indicates that the upper and lower complete dentures " fit okay".  The patient indicates that the area of concern involving the mandibular right soft tissue did not bother him. Patient is not sure how long the soft tissue abnormalities have been that way.    DENTAL EXAMINATION  GENERAL: Patient is a well-developed, tall, well-nourished male in no acute distress. HEAD AND NECK: There is no palpable submandibular lymphadenopathy. The patient denies acute TMJ symptoms. INTRAORAL EXAM: The patient has normal saliva. The patient has redundant tissue at the posterior aspect of the mandibular right and mandibular left retromolar pad areas.  This most likely represents epulis fissuratum formation due to an ill fitting lower complete denture. The patient also has a 1 cm area emanating from the post dam area of the denture and  extending into the soft palate. 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  DENTITION:  The patient is edentulous. PROSTHODONTIC: The patient has an upper and lower complete denture. These are ill fitting. They have less than ideal retention and stability. These were fabricated 20+ years ago by patient report. The patient indicates that Dr. Demetrius Revel fabricated these dentures. OCCLUSION: The occlusion of the upper lower complete dentures is less than ideal at this time. There is significant wearing of the denture teeth. RADIOGRAPHIC INTERPRETATION: An orthopantogram was obtained. The patient is edentulous. There is atrophy of edentulous alveolar ridges. There is pneumatization of the bilateral maxillary sinuses. The right maxillary sinus shows more opacity. There is no evidence of retained root tips or impacted teeth.  ASSESSMENTS: 1. The patient is edentulous. 2. There is atrophy of  the edentulous alveolar ridges. 3. The patient has ill fitting maxillary  and mandibular complete dentures. 4. There is bilateral pneumatization of the maxillary sinuses 5. There is an opacity within the maxillary right sinus. 6. There is redundant soft tissue associated with the soft tissues posterior to the ill fitting mandibular dentures. This is on the mandibular right and mandibular left retromolar pad areas. This most likely represents epulis fissuratum. Suggest consideration for removal by an oral surgeon prior to fabrication of a new upper lower complete denture. 7. There is a soft tissue lesion posterior to the post dam area of the upper complete denture at the midline that has both erythroplakia and leukoplakia. Suggest consideration for biopsy by an oral surgeon.  PLAN/RECOMMENDATIONS: 1. I discussed the risks, benefits, and complications of various treatment options with the patient in relationship to his medical and dental conditions. We discussed various treatment options to include no treatment, referral to an oral surgeon for evaluation and consideration of pre-prosthetic surgery and biopsies, and referral to Dr. Osa Craver for replacement of upper and lower complete dentures. The patient currently wishes to proceed with referral to an oral surgeon for evaluation of the soft tissue lesions for possible removal and biopsy as indicated. Patient will also consider fabrication of a new upper lower complete denture and will contact Dr. Osa Craver to schedule this appointment after his surgical consultation and treatment as provided.   2. Discussion of findings with medical team and coordination of future medical and dental care as needed.  I spent 45 minutes face to face with patient and more than 50% of time was spent in counseling and /or coordination of care.   Lenn Cal, DDS

## 2013-05-07 DIAGNOSIS — D3705 Neoplasm of uncertain behavior of pharynx: Secondary | ICD-10-CM | POA: Diagnosis not present

## 2013-05-07 DIAGNOSIS — D3701 Neoplasm of uncertain behavior of lip: Secondary | ICD-10-CM | POA: Diagnosis not present

## 2013-05-14 DIAGNOSIS — K1120 Sialoadenitis, unspecified: Secondary | ICD-10-CM | POA: Diagnosis not present

## 2013-05-14 DIAGNOSIS — K1370 Unspecified lesions of oral mucosa: Secondary | ICD-10-CM | POA: Diagnosis not present

## 2013-05-14 DIAGNOSIS — K115 Sialolithiasis: Secondary | ICD-10-CM | POA: Diagnosis not present

## 2013-05-25 ENCOUNTER — Other Ambulatory Visit: Payer: Self-pay | Admitting: Internal Medicine

## 2013-05-28 ENCOUNTER — Telehealth (HOSPITAL_COMMUNITY): Payer: Self-pay

## 2013-05-28 ENCOUNTER — Ambulatory Visit: Payer: Medicare Other | Admitting: Radiation Oncology

## 2013-05-28 DIAGNOSIS — H40029 Open angle with borderline findings, high risk, unspecified eye: Secondary | ICD-10-CM | POA: Diagnosis not present

## 2013-05-28 NOTE — Telephone Encounter (Signed)
05/28/2013  Called and spoke w/patient about following up w/ Dr. April Holding regarding denture care. Patient is scheduled for 05/28/13 @ 8:30 w/ Dr. April Holding for treatment. LRI

## 2013-06-11 ENCOUNTER — Ambulatory Visit
Admission: RE | Admit: 2013-06-11 | Discharge: 2013-06-11 | Disposition: A | Discharge status: Home / Self Care | Payer: Medicare Other | Source: Ambulatory Visit | Attending: Radiation Oncology | Admitting: Radiation Oncology

## 2013-06-11 ENCOUNTER — Encounter: Payer: Self-pay | Admitting: Radiation Oncology

## 2013-06-11 VITALS — BP 122/80 | HR 79 | Resp 16 | Wt 215.0 lb

## 2013-06-11 DIAGNOSIS — C154 Malignant neoplasm of middle third of esophagus: Secondary | ICD-10-CM

## 2013-06-11 NOTE — Progress Notes (Signed)
Last TSH drawn November 2014. Scheduled to follow up with Plotnikov on 4/30 and Sherrill on 7/17. Reports productive cough and congestion continue. Reports taking Mucinex. Reports cough is productive with clear sputum. Weight stable. Reports that he has an excellent appetite. Denies fatigue. Continues to work part time 4 out of 5 days. Reports recent biopsy of roof of mouth and back of gums have came back negative for cancer. Denies sores or ulceration of oral mucosa. Denies nausea, vomiting, headache, dizziness, night sweats or diarrhea.

## 2013-06-11 NOTE — Progress Notes (Signed)
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  Scheduled to follow up with Plotnikov on 4/30 and Sherrill on 7/17. Reports productive cough and congestion continue. Reports taking Mucinex. Reports cough is productive with clear sputum. Weight stable. Reports that he has an excellent appetite. Denies fatigue. Continues to work part time 4 out of 5 days. Reports recent biopsy of roof of mouth and back of gums have came back negative for cancer. Denies sores or ulceration of oral mucosa. Denies nausea, vomiting, headache, dizziness, night sweats or diarrhea                              ALLERGIES:  is allergic to amoxicillin; cefuroxime axetil; and fluconazole.  Meds: Current Outpatient Prescriptions  Medication Sig Dispense Refill  . Aclidinium Bromide 400 MCG/ACT AEPB Inhale 1 Act into the lungs 2 (two) times daily.      Marland Kitchen aspirin 81 MG EC tablet Take 81 mg by mouth daily.        . baclofen (LIORESAL) 10 MG tablet Take 10 mg by mouth Daily.      . calcium carbonate (TUMS - DOSED IN MG ELEMENTAL CALCIUM) 500 MG chewable tablet Chew 2 tablets by mouth 2 (two) times daily as needed. Acid reflux/gas      . Cholecalciferol 1000 UNITS tablet Take 1,000 Units by mouth daily.        . clonazePAM (KLONOPIN) 1 MG tablet TAKE 1/2 TO 1 TABLET BY MOUTH TWICE DAILY AS NEEDED FOR ANXIETY  60 tablet  3  . fish oil-omega-3 fatty acids 1000 MG capsule Take 1 g by mouth daily.      Marland Kitchen guaiFENesin (MUCINEX) 600 MG 12 hr tablet Take 1,200 mg by mouth as needed for congestion.      .  Ipratropium-Albuterol (COMBIVENT IN) Inhale into the lungs as needed.      Marland Kitchen levothyroxine (SYNTHROID, LEVOTHROID) 112 MCG tablet Take 1 tablet (112 mcg total) by mouth daily.  30 tablet  11  . loratadine (CLARITIN) 10 MG tablet Take 10 mg by mouth daily.       . Mometasone Furo-Formoterol Fum (DULERA IN) Inhale into the lungs as needed.      Marland Kitchen omeprazole (PRILOSEC) 40 MG capsule Take 1 capsule (40 mg total) by mouth daily.  90 capsule  3  . simvastatin (ZOCOR) 20 MG tablet Take 1 tablet (20 mg total) by mouth daily.  90 tablet  3   No current facility-administered medications for this encounter.    Physical Findings: The patient is in no acute distress. Patient is alert and oriented.  weight is 215 lb (97.523 kg). His blood pressure is 122/80 and his pulse is 79. His respiration is 16. .  No significant changes.  Lab Findings: Lab Results  Component Value Date   WBC 4.6 01/29/2013   HGB 13.4 01/29/2013   HCT 39.7 01/29/2013   MCV 85.8 01/29/2013   PLT 260.0 01/29/2013    Impression:  The patient is NED  4 years s/p radiotherapy.  Plan:  Follow-up in 1 year.  _____________________________________  Sheral Apley. Tammi Klippel, M.D.

## 2013-07-23 ENCOUNTER — Ambulatory Visit (INDEPENDENT_AMBULATORY_CARE_PROVIDER_SITE_OTHER): Payer: Medicare Other | Admitting: Internal Medicine

## 2013-07-23 ENCOUNTER — Other Ambulatory Visit (INDEPENDENT_AMBULATORY_CARE_PROVIDER_SITE_OTHER): Payer: Medicare Other

## 2013-07-23 ENCOUNTER — Encounter: Payer: Self-pay | Admitting: Internal Medicine

## 2013-07-23 VITALS — BP 104/70 | HR 80 | Temp 97.7°F | Resp 16 | Wt 214.0 lb

## 2013-07-23 DIAGNOSIS — I10 Essential (primary) hypertension: Secondary | ICD-10-CM | POA: Diagnosis not present

## 2013-07-23 DIAGNOSIS — R7309 Other abnormal glucose: Secondary | ICD-10-CM

## 2013-07-23 DIAGNOSIS — N320 Bladder-neck obstruction: Secondary | ICD-10-CM

## 2013-07-23 DIAGNOSIS — D509 Iron deficiency anemia, unspecified: Secondary | ICD-10-CM

## 2013-07-23 DIAGNOSIS — I2510 Atherosclerotic heart disease of native coronary artery without angina pectoris: Secondary | ICD-10-CM | POA: Diagnosis not present

## 2013-07-23 DIAGNOSIS — J309 Allergic rhinitis, unspecified: Secondary | ICD-10-CM

## 2013-07-23 DIAGNOSIS — H6120 Impacted cerumen, unspecified ear: Secondary | ICD-10-CM

## 2013-07-23 DIAGNOSIS — E039 Hypothyroidism, unspecified: Secondary | ICD-10-CM

## 2013-07-23 LAB — BASIC METABOLIC PANEL
BUN: 15 mg/dL (ref 6–23)
CALCIUM: 9.7 mg/dL (ref 8.4–10.5)
CO2: 28 mEq/L (ref 19–32)
CREATININE: 1.5 mg/dL (ref 0.4–1.5)
Chloride: 99 mEq/L (ref 96–112)
GFR: 60.44 mL/min (ref >60.00)
Glucose, Bld: 88 mg/dL (ref 70–99)
Potassium: 4.4 mEq/L (ref 3.5–5.1)
Sodium: 135 mEq/L (ref 135–145)

## 2013-07-23 LAB — HEMOGLOBIN A1C: HEMOGLOBIN A1C: 6.5 % (ref 4.6–6.5)

## 2013-07-23 LAB — TSH: TSH: 2.8 u[IU]/mL (ref 0.35–5.50)

## 2013-07-23 NOTE — Assessment & Plan Note (Signed)
See procedure 

## 2013-07-23 NOTE — Assessment & Plan Note (Signed)
A1c

## 2013-07-23 NOTE — Progress Notes (Signed)
Subjective:    HPI  C/o allergies, sneezing - bad  F/u COPD.... No dysphagia. No hiccups. F/u wt loss - gained wt F/u esoph tumor - doing well     Review of Systems  Constitutional: Negative for appetite change, fatigue and unexpected weight change.  HENT: Negative for congestion, nosebleeds, sneezing, sore throat and trouble swallowing.   Eyes: Negative for itching and visual disturbance.  Respiratory: Negative for cough.   Cardiovascular: Negative for chest pain, palpitations and leg swelling.  Gastrointestinal: Negative for nausea, diarrhea, blood in stool and abdominal distention.  Genitourinary: Negative for frequency and hematuria.  Musculoskeletal: Negative for back pain, gait problem, joint swelling and neck pain.  Skin: Negative for rash.  Neurological: Negative for dizziness, tremors, speech difficulty and weakness.  Psychiatric/Behavioral: Negative for suicidal ideas, sleep disturbance, dysphoric mood and agitation. The patient is not nervous/anxious.     Wt Readings from Last 3 Encounters:  07/23/13 214 lb (97.07 kg)  06/11/13 215 lb (97.523 kg)  04/09/13 211 lb 1.6 oz (95.754 kg)   BP Readings from Last 3 Encounters:  07/23/13 104/70  06/11/13 122/80  04/16/13 120/80        Objective:   Physical Exam  Constitutional: He is oriented to person, place, and time. He appears well-developed.  HENT:  Mouth/Throat: Oropharynx is clear and moist.  Eyes: Conjunctivae are normal. Pupils are equal, round, and reactive to light.  Neck: Normal range of motion. No JVD present. No thyromegaly present.  Cardiovascular: Normal rate, regular rhythm, normal heart sounds and intact distal pulses.  Exam reveals no gallop and no friction rub.   No murmur heard. Pulmonary/Chest: Effort normal and breath sounds normal. No respiratory distress. He has no wheezes. He has no rales. He exhibits no tenderness.  Abdominal: Soft. Bowel sounds are normal. He exhibits no distension  and no mass. There is no tenderness. There is no rebound and no guarding.  Musculoskeletal: Normal range of motion. He exhibits no edema and no tenderness.  Lymphadenopathy:    He has no cervical adenopathy.  Neurological: He is alert and oriented to person, place, and time. He has normal reflexes. No cranial nerve deficit. He exhibits normal muscle tone. Coordination normal.  Skin: Skin is warm and dry. No rash noted.  Psychiatric: He has a normal mood and affect. His behavior is normal. Judgment and thought content normal.  swollen mucosa B wax  Lab Results  Component Value Date   WBC 4.6 01/29/2013   HGB 13.4 01/29/2013   HCT 39.7 01/29/2013   PLT 260.0 01/29/2013   GLUCOSE 102* 01/29/2013   CHOL 151 01/29/2013   TRIG 140.0 01/29/2013   HDL 45.40 01/29/2013   LDLCALC 78 01/29/2013   ALT 11 01/29/2013   AST 25 01/29/2013   NA 138 01/29/2013   K 4.5 01/29/2013   CL 102 01/29/2013   CREATININE 1.4 01/29/2013   BUN 14 01/29/2013   CO2 29 01/29/2013   TSH 1.87 01/29/2013   PSA 3.28 01/29/2013   INR 1.19 04/28/2009   HGBA1C 6.9* 01/29/2013      Procedure Note :     Procedure :  Ear irrigation   Indication:  Cerumen impaction B R>L   Risks, including pain, dizziness, eardrum perforation, bleeding, infection and others as well as benefits were explained to the patient in detail. Verbal consent was obtained and the patient agreed to proceed.    We used "The Elephant Ear Irrigation Device" filled with lukewarm water for irrigation.  A large amount wax was recovered. Procedure has also required manual wax removal with an ear loop.   Tolerated well. Complications: None.   Postprocedure instructions :  Call if problems.         Assessment & Plan:

## 2013-07-23 NOTE — Assessment & Plan Note (Signed)
Claritin qd Flonase Declined Depomedrol

## 2013-07-23 NOTE — Assessment & Plan Note (Signed)
Continue with current prescription therapy as reflected on the Med list.  

## 2013-07-23 NOTE — Assessment & Plan Note (Signed)
Labs

## 2013-07-23 NOTE — Progress Notes (Signed)
Pre visit review using our clinic review tool, if applicable. No additional management support is needed unless otherwise documented below in the visit note. 

## 2013-08-03 ENCOUNTER — Other Ambulatory Visit: Payer: Self-pay | Admitting: Internal Medicine

## 2013-08-18 ENCOUNTER — Other Ambulatory Visit: Payer: Self-pay | Admitting: Internal Medicine

## 2013-09-05 ENCOUNTER — Telehealth: Payer: Self-pay | Admitting: Oncology

## 2013-09-05 NOTE — Telephone Encounter (Signed)
PAL - MOVED 7/17 APPT TO 7/30. S/W PT HE IS AWARE.

## 2013-09-09 ENCOUNTER — Other Ambulatory Visit: Payer: Self-pay | Admitting: Internal Medicine

## 2013-09-12 ENCOUNTER — Other Ambulatory Visit: Payer: Self-pay | Admitting: Internal Medicine

## 2013-09-17 DIAGNOSIS — IMO0002 Reserved for concepts with insufficient information to code with codable children: Secondary | ICD-10-CM | POA: Diagnosis not present

## 2013-10-09 ENCOUNTER — Ambulatory Visit: Payer: Medicare Other | Admitting: Oncology

## 2013-10-22 ENCOUNTER — Telehealth: Payer: Self-pay | Admitting: Oncology

## 2013-10-22 ENCOUNTER — Ambulatory Visit (HOSPITAL_BASED_OUTPATIENT_CLINIC_OR_DEPARTMENT_OTHER): Payer: Medicare Other | Admitting: Oncology

## 2013-10-22 VITALS — BP 119/72 | HR 93 | Temp 98.3°F | Resp 18 | Ht 76.0 in | Wt 209.8 lb

## 2013-10-22 DIAGNOSIS — Z8501 Personal history of malignant neoplasm of esophagus: Secondary | ICD-10-CM | POA: Diagnosis not present

## 2013-10-22 DIAGNOSIS — C159 Malignant neoplasm of esophagus, unspecified: Secondary | ICD-10-CM

## 2013-10-22 NOTE — Progress Notes (Signed)
  [facility] OFFICE PROGRESS NOTE   Diagnosis: Esophagus cancer  INTERVAL HISTORY:   Aaron [name] returns as scheduled. He feels well. No dysphagia. He continues working. He reports undergoing a negative evaluation and biopsy for the leukoplakia.  Objective:  Vital signs in last 24 hours:  Blood pressure 119/72, pulse 93, temperature 98.3 F (36.8 C), temperature source Oral, resp. rate 18, height 6\' 4"  (1.93 m), weight 209 lb 12.8 oz (95.165 kg).    HEENT: ? Leukoplakia the bilateral posterior buccal mucosa. No mass. Neck without mass. Lymphatics: No cervical, supraclavicular, or axillary nodes Resp: Lungs clear bilaterally Cardio: Regular rate and rhythm GI: No hepatomegaly, nontender Vascular: No leg edema   Medications: I have reviewed the patient's current medications.  Assessment/Plan: 1. Squamous cell carcinoma of the esophagus, status post an endoscopic biopsy, 04/07/2009, with the pathology confirming at least superficial invasion. 2. PET scan, 04/20/2009, confirmed hypermetabolic activity associated with the upper and thoracic esophagus mass with a single hypermetabolic right paratracheal lymph node. He began concurrent radiation and weekly Taxol/carboplatin chemotherapy, 05/03/2009. The last treatment with Taxol/carboplatin was given 06/14/2009. He completed radiation on 06/16/2009.  3. Solid/liquid dysphagia secondary to the proximal esophageal mass, resolved. He is status post an esophageal dilatation for management of a stricture, 08/24/2009, repeat esophageal dilatation 10/03/2011. 4. Borderline enlarged mediastinal and gastrohepatic lymph nodes on staging CT scans. PET scan 04/20/2009 confirmed hypermetabolic activity associated with the right paratracheal lymph node. 5. Hypertension. 6. Status post placement of a feeding jejunostomy tube by Dr. Johney Maine. The jejunostomy tube has been removed. 7. Admission with neutropenia and anemia, March 2011.   8. Fever of unknown origin, April 2011, resolved. He was treated with prednisone beginning on 07/13/2009 for presumed radiation pneumonitis. He developed episodes of rigors on prednisone, and the prednisone was discontinued, 07/15/2009. A CT scan did not reveal evidence for an infectious process. 9. Zoster rash at the right back and anterior chest July 2013. 10. Questionable leukoplakia on exam 04/09/2013-she reports undergoing a negative biopsy by his dentist   Disposition:  Aaron [name] remains in clinical remission from esophagus cancer. He will return for an office visit in 6 months.  Aaron Coder, MD  10/22/2013  4:40 PM

## 2013-10-22 NOTE — Telephone Encounter (Signed)
Pt confirmed ov per 07/30 POF, gave pt AVS...Marland KitchenMarland KitchenKJ

## 2013-11-02 ENCOUNTER — Other Ambulatory Visit: Payer: Self-pay | Admitting: Internal Medicine

## 2013-12-08 ENCOUNTER — Other Ambulatory Visit: Payer: Self-pay | Admitting: Internal Medicine

## 2013-12-10 ENCOUNTER — Telehealth: Payer: Self-pay

## 2013-12-10 NOTE — Telephone Encounter (Signed)
Received refill request for clonazepam 1 mg tabs, take 1/3 tab po bid prn for anxiety.  Pt has not been seen since April, and has an appt on 01/28/14. Do you want to refill? Please advise

## 2013-12-11 MED ORDER — CLONAZEPAM 1 MG PO TABS: ORAL_TABLET | ORAL | Status: DC

## 2013-12-11 NOTE — Telephone Encounter (Signed)
OK to fill this prescription with additional refills x0 Sch OV Thank you!

## 2013-12-14 ENCOUNTER — Telehealth: Payer: Self-pay | Admitting: *Deleted

## 2013-12-14 NOTE — Telephone Encounter (Signed)
[facility] Triage Call Report Triage Record Num: [id number] Operator: Tommie Ard Patient Name: Aaron [name] Call Date & Time: 12/12/2013 1:04:23PM Patient Phone: [phone] PCP: Walker Kehr Patient Gender: Male PCP Fax : [phone] Patient DOB: [birth year] Practice Name: [facility] Reason for Call: Caller: [name]/Patient; PCP: Walker Kehr (Adults only); CB#: [phone]; Call regarding Medication Issue; Medication(s): clonazepam . Patient states that he has requested refill on Clonazepam but it has not be refilled. Pharmacy has been contacted twice. Reviewed EPIC and there is a note from 12/11/13 from staff asking Dr. Jacalyn Lefevre to ok refill of medication. It is noted that Dr.Plotnikove advised- Cassandria Anger, MD at 12/11/2013 1:16 PM Status: Signed OK to fill this prescription with additional refills x0. Sch OV.Thank you! . Patient has OV on 01/28/14. Contacted [facility]. No record of refill being sent or called. Location- [address] at [phone]. Reviewed order with pharmacy and verified order. Reviewed EPIC order and advised that patient can have per EPIC Clonazepam 1mg  take a 1/2 to 1 tablet twice daily as needed for anxiety, #60 with NO REFILL. Spoke with pharmacist Barnabas Lister . Patient given information regarding refill. Understanding expressed. EPIC reviewed for allergies, medication and history. MEDICATION REFILL REQUEST. VERIFIED BY EPIC Protocol(s) Used: Office Note Recommended Outcome per Protocol: Information Noted and Sent to Office Reason for Outcome: Caller information to office Care Advice: ~ 09/

## 2013-12-14 NOTE — Telephone Encounter (Signed)
Faxed script to [facility].../lmb 

## 2013-12-17 ENCOUNTER — Ambulatory Visit (INDEPENDENT_AMBULATORY_CARE_PROVIDER_SITE_OTHER): Payer: Medicare Other | Admitting: Cardiovascular Disease

## 2013-12-17 ENCOUNTER — Encounter: Payer: Self-pay | Admitting: Cardiovascular Disease

## 2013-12-17 VITALS — BP 110/76 | HR 65 | Ht 75.0 in | Wt 213.7 lb

## 2013-12-17 DIAGNOSIS — I2510 Atherosclerotic heart disease of native coronary artery without angina pectoris: Secondary | ICD-10-CM

## 2013-12-17 DIAGNOSIS — C154 Malignant neoplasm of middle third of esophagus: Secondary | ICD-10-CM | POA: Diagnosis not present

## 2013-12-17 DIAGNOSIS — E039 Hypothyroidism, unspecified: Secondary | ICD-10-CM

## 2013-12-17 DIAGNOSIS — E785 Hyperlipidemia, unspecified: Secondary | ICD-10-CM | POA: Diagnosis not present

## 2013-12-17 DIAGNOSIS — I10 Essential (primary) hypertension: Secondary | ICD-10-CM | POA: Diagnosis not present

## 2013-12-17 NOTE — Progress Notes (Signed)
Patient ID: Aaron [name], male   DOB: 02/15/1936, 78 y.o.   MRN: [id number]      HPI: Aaron [name] is a 78 y.o. male presents to the office today for one-year cardiology evaluation.  Aaron [name] has a history of mild CAD with 30% ostial LAD stenosis and 20% OM1 narrowing noted at cardiac catheterization in June 2010. Additional problems include hypertension with mild diastolic dysfunction, hyperlipidemia, and history of esophageal cancer, status post chemotherapy radiation therapy, history of hypothyroidism for which he is on thyroid replacement therapy. Over the past year, Aaron [name] has continued to do well. He is working part-time approximately 20 hours per week which does give him some exercise.   He will be seeing [name], his primary physician, and a complete set of blood work will be obtained next month.  He denies any change in exercise tolerance.  He denies chest pressure.  He denies palpitations.  He denies presyncope or syncope.  He denies edema.  He does have a history of diverticulosis and mild COPD.   Past Medical History  Diagnosis Date  . Diverticulosis of colon   . Gastritis   . Hemorrhoids   . GERD (gastroesophageal reflux disease)   . Hiatal hernia   . GERD (gastroesophageal reflux disease)   . Hyperlipidemia     [name]  . HTN (hypertension)     mild diastolic dysfunction   . Elevated glucose   . COPD (chronic obstructive pulmonary disease)   . 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 History of radiation therapy 05/03/09-06/16/09    squamous cell ca of esophagus  . Hypothyroid     s/p radiotherapy  . Arrhythmia   . Allergic rhinitis   . History of chemotherapy 05/03/09-06/16/09    Taxol/carboplatin  . CAD (coronary artery disease)     mild  . History of nuclear stress test 07/2008    exercise; mild-mod perfusion dfect in basal inf/mid inf/apical inf regions; EKG negative for ischemia    Past Surgical  History  Procedure Laterality Date  . Lower back cyst removed      [facility]  . Creation of cutaneous stoma  2011  . Tonsillectomy      age 26  . Esophagogastroduodenoscopy  10/03/2011    Procedure: ESOPHAGOGASTRODUODENOSCOPY (EGD);  Surgeon: [name], MD,FACG;  Location: [name];  Service: Endoscopy;  Laterality: N/A;  no fluro needed  . [name] dilation  10/03/2011    Procedure: [name] DILATION;  Surgeon: [name], MD,FACG;  Location: [facility];  Service: Endoscopy;  Laterality: N/A;  . Cardiac catheterization  08/2008    mild nonobstructive CAD with 30% LAD stenosis prox & 20% OM1 narrowing   . Transthoracic echocardiogram  06/2009    EF=>55%; trace MR/TR    Allergies  Allergen Reactions  . Amoxicillin     REACTION: rash  . Cefuroxime Axetil     REACTION: nit feeling well  . Fluconazole     REACTION: shaking    Current Outpatient Prescriptions  Medication Sig Dispense Refill  . Aclidinium Bromide 400 MCG/ACT AEPB Inhale 1 Act into the lungs 2 (two) times daily.      Marland Kitchen aspirin 81 MG EC tablet Take 81 mg by mouth daily.        . calcium carbonate (TUMS - DOSED IN MG ELEMENTAL CALCIUM) 500 MG chewable tablet Chew 2 tablets by mouth 2 (two) times daily  as needed. Acid reflux/gas      . Cholecalciferol 1000 UNITS tablet Take 1,000 Units by mouth daily.        . clonazePAM (KLONOPIN) 1 MG tablet TAKE 1/2 TO 1 TABLET BY MOUTH TWICE DAILY AS NEEDED FOR ANXIETY  60 tablet  3  . clotrimazole-betamethasone (LOTRISONE) cream APPLY TOPICALLY TWICE DAILY  15 g  2  . fish oil-omega-3 fatty acids 1000 MG capsule Take 1 g by mouth daily.      Marland Kitchen guaiFENesin (MUCINEX) 600 MG 12 hr tablet Take 1,200 mg by mouth as needed for congestion.      . Ipratropium-Albuterol (COMBIVENT IN) Inhale into the lungs as needed.      Marland Kitchen levothyroxine (SYNTHROID, LEVOTHROID) 112 MCG tablet TAKE 1 TABLET BY MOUTH DAILY  30 tablet  5  . loratadine (CLARITIN) 10 MG tablet Take 10 mg by mouth daily.        . Mometasone Furo-Formoterol Fum (DULERA IN) Inhale into the lungs as needed.      Marland Kitchen omeprazole (PRILOSEC) 40 MG capsule TAKE ONE CAPSULE BY MOUTH DAILY  90 capsule  2  . simvastatin (ZOCOR) 20 MG tablet TAKE 1 TABLET BY MOUTH DAILY  90 tablet  2   No current facility-administered medications for this visit.    History   Social History  . Marital Status: Married    Spouse Name: N/A    Number of Children: 3  . Years of Education: 12th   Occupational History  . 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 Smoking status: Former Smoker -- 1.00 packs/day for 40 years    Quit date: 01/16/2001  . Smokeless tobacco: Never Used     Comment: Quit 2002  . Alcohol Use: 0.6 oz/week    1 Glasses of wine per week  . Drug Use: No     Comment: quit smoking 2002  . Sexual Activity: Yes   Other Topics Concern  . Not on file   Social History Narrative   Daily Caffeine - 1          Family History  Problem Relation Age of Onset  . Stroke Maternal Grandfather 35  . Prostate cancer Father     69's  . Diabetes Father   . Prostate cancer Brother 60  . Prostate cancer Brother 2  . Coronary artery disease Neg Hx   . Colon cancer Neg Hx   . Hypertension Neg Hx   . Stroke Mother     aneurism    ROS General: Negative; No fevers, chills, or night sweats;  HEENT: Negative; No changes in vision or hearing, sinus congestion, difficulty swallowing Pulmonary: Negative; No cough, wheezing, shortness of breath, hemoptysis Cardiovascular: Negative; No chest pain, presyncope, syncope, palpitations.  No claudication GI: Negative; No nausea, vomiting, diarrhea, or abdominal pain GU: Negative; No dysuria, hematuria, or difficulty voiding Musculoskeletal: Negative; no myalgias, joint pain, or weakness Hematologic/Oncology: Negative; no easy bruising, bleeding Endocrine: Negative; no heat/cold intolerance; no diabetes Neuro: Negative; no changes in  balance, headaches Skin: Negative; No rashes or skin lesions Psychiatric: Negative; No behavioral problems, depression Sleep: Negative; No snoring, daytime sleepiness, hypersomnolence, bruxism, restless legs, hypnogognic hallucinations, no cataplexy Other comprehensive 14 point system review is negative.  PE BP 110/76  Pulse 65  Ht 6' 3"  (1.905 m)  Wt 213 lb 11.2 oz (96.934 kg)  BMI 26.71 kg/m2  Repeat blood pressure by me was 112/64 supine  and 102/60 standing. General: Alert, oriented, no distress.  Skin: normal turgor, no rashes HEENT: Normocephalic, atraumatic. Pupils round and reactive; sclera anicteric;no lid lag.  Nose without nasal septal hypertrophy Mouth/Parynx benign; Mallinpatti scale 2 Neck: No JVD, no carotid bruits with normal carotid upstroke. Lungs: clear to ausculatation and percussion; no wheezing or rales Chest wall: Nontender to  Heart: RRR, s1 s2 normal 1/6 systolic murmur; no diastolic murmur.  No S3 or S4 gallop.  No rubs, murmurs or heaves  Abdomen: Very mild diastases recti; soft, nontender; no hepatosplenomehaly, BS+; abdominal aorta nontender and not dilated by palpation. Back: No CVA tenderness  Pulses 2+ Extremities: no clubbing cyanosis or edema, Homan's sign negative  Neurologic: grossly nonfocal; cranial nerves grossly normal.   Psychological: Normal cognitive 100 normal affect and mood.  ECG (independently read by me): Normal sinus rhythm at 65 beats per minute.  Normal intervals.  No ST segment changes.  Prior September 2014 ECG: Normal sinus rhythm at 60 beats per minute. Intervals are normal.  LABS:  BMET    Component Value Date/Time   NA 135 07/23/2013 1043   K 4.4 07/23/2013 1043   CL 99 07/23/2013 1043   CO2 28 07/23/2013 1043   GLUCOSE 88 07/23/2013 1043   GLUCOSE 100* 02/26/2006 1420   BUN 15 07/23/2013 1043   CREATININE 1.5 07/23/2013 1043   CALCIUM 9.7 07/23/2013 1043   GFRNONAA >60 08/15/2009 0634   GFRAA  Value: >60        The eGFR  has been calculated using the MDRD equation. This calculation has not been validated in all clinical situations. eGFR's persistently <60 mL/min signify possible Chronic Kidney Disease. 08/15/2009 0634     Hepatic Function Panel     Component Value Date/Time   PROT 7.8 01/29/2013 0957   ALBUMIN 4.0 01/29/2013 0957   AST 25 01/29/2013 0957   ALT 11 01/29/2013 0957   ALKPHOS 66 01/29/2013 0957   BILITOT 0.4 01/29/2013 0957   BILIDIR 0.1 01/29/2013 0957     CBC    Component Value Date/Time   WBC 4.6 01/29/2013 0957   WBC 5.6 07/06/2009 1058   RBC 4.63 01/29/2013 0957   RBC 3.45* 07/06/2009 1058   HGB 13.4 01/29/2013 0957   HGB 10.4* 07/06/2009 1058   HCT 39.7 01/29/2013 0957   HCT 30.5* 07/06/2009 1058   PLT 260.0 01/29/2013 0957   PLT 273 07/06/2009 1058   MCV 85.8 01/29/2013 0957   MCV 88.5 07/06/2009 1058   MCH 30.1 07/06/2009 1058   MCHC 33.6 01/29/2013 0957   MCHC 34.0 07/06/2009 1058   RDW 14.5 01/29/2013 0957   RDW 19.5* 07/06/2009 1058   LYMPHSABS 1.1 01/29/2013 0957   LYMPHSABS 0.7* 07/06/2009 1058   MONOABS 0.5 01/29/2013 0957   MONOABS 0.4 07/06/2009 1058   EOSABS 0.3 01/29/2013 0957   EOSABS 0.3 07/06/2009 1058   BASOSABS 0.0 01/29/2013 0957   BASOSABS 0.0 07/06/2009 1058     BNP No results found for this basename: probnp    Lipid Panel     Component Value Date/Time   CHOL 151 01/29/2013 0957   TRIG 140.0 01/29/2013 0957   HDL 45.40 01/29/2013 0957   CHOLHDL 3 01/29/2013 0957   VLDL 28.0 01/29/2013 0957   LDLCALC 78 01/29/2013 0957     ASSESSMENT AND PLAN: Aaron [name]  will be turning 78 years old next week. He continues to do exceptionally well on his current medical therapy. His blood pressure today is  well controlled . He denies recent chest pain. He does have mild nonobstructive CAD and is not having any anginal symptomatology.  He does have GERD which is well controlled with omeprazole.  He has hypothyroidism, currently on Synthroid 112 mcg daily.  He also has hyperlipidemia,  currently on simvastatin 20 mg.  He will be following up with Dr. Benay Spice 5 years after his esophageal cancer, which appears stable. I will review laboratory when completed by Dr Alain Marion. He denies shortness of breath. ECG is stable. I will see him in one year for followup evaluation or sooner if problems arise.     Troy Sine, MD, [facility]  12/17/2013 12:27 PM

## 2013-12-17 NOTE — Patient Instructions (Signed)
Your physician wants you to follow-up in: 1 year. You will receive a reminder letter in the mail two months in advance. If you don't receive a letter, please call our office to schedule the follow-up appointment.  

## 2014-01-01 DIAGNOSIS — Z23 Encounter for immunization: Secondary | ICD-10-CM | POA: Diagnosis not present

## 2014-01-28 ENCOUNTER — Other Ambulatory Visit (INDEPENDENT_AMBULATORY_CARE_PROVIDER_SITE_OTHER): Payer: Medicare Other

## 2014-01-28 ENCOUNTER — Encounter: Payer: Self-pay | Admitting: Internal Medicine

## 2014-01-28 ENCOUNTER — Ambulatory Visit (INDEPENDENT_AMBULATORY_CARE_PROVIDER_SITE_OTHER): Payer: Medicare Other | Admitting: Internal Medicine

## 2014-01-28 VITALS — BP 120/88 | HR 81 | Temp 98.4°F | Ht 75.0 in | Wt 211.0 lb

## 2014-01-28 DIAGNOSIS — R7309 Other abnormal glucose: Secondary | ICD-10-CM | POA: Diagnosis not present

## 2014-01-28 DIAGNOSIS — Z125 Encounter for screening for malignant neoplasm of prostate: Secondary | ICD-10-CM | POA: Diagnosis not present

## 2014-01-28 DIAGNOSIS — E785 Hyperlipidemia, unspecified: Secondary | ICD-10-CM

## 2014-01-28 DIAGNOSIS — Z0000 Encounter for general adult medical examination without abnormal findings: Secondary | ICD-10-CM

## 2014-01-28 DIAGNOSIS — I10 Essential (primary) hypertension: Secondary | ICD-10-CM | POA: Diagnosis not present

## 2014-01-28 DIAGNOSIS — J309 Allergic rhinitis, unspecified: Secondary | ICD-10-CM

## 2014-01-28 DIAGNOSIS — C154 Malignant neoplasm of middle third of esophagus: Secondary | ICD-10-CM | POA: Diagnosis not present

## 2014-01-28 DIAGNOSIS — N320 Bladder-neck obstruction: Secondary | ICD-10-CM

## 2014-01-28 DIAGNOSIS — D509 Iron deficiency anemia, unspecified: Secondary | ICD-10-CM

## 2014-01-28 DIAGNOSIS — I2510 Atherosclerotic heart disease of native coronary artery without angina pectoris: Secondary | ICD-10-CM

## 2014-01-28 DIAGNOSIS — E039 Hypothyroidism, unspecified: Secondary | ICD-10-CM

## 2014-01-28 DIAGNOSIS — E034 Atrophy of thyroid (acquired): Secondary | ICD-10-CM

## 2014-01-28 DIAGNOSIS — E038 Other specified hypothyroidism: Secondary | ICD-10-CM

## 2014-01-28 LAB — URINALYSIS
BILIRUBIN URINE: NEGATIVE
Ketones, ur: NEGATIVE
LEUKOCYTES UA: NEGATIVE
NITRITE: NEGATIVE
Specific Gravity, Urine: 1.01 (ref 1.000–1.030)
TOTAL PROTEIN, URINE-UPE24: NEGATIVE
Urine Glucose: NEGATIVE
Urobilinogen, UA: 0.2 (ref 0.0–1.0)
pH: 6.5 (ref 5.0–8.0)

## 2014-01-28 LAB — HEPATIC FUNCTION PANEL
ALK PHOS: 65 U/L (ref 39–117)
ALT: 16 U/L (ref 0–53)
AST: 26 U/L (ref 0–37)
Albumin: 3.6 g/dL (ref 3.5–5.2)
BILIRUBIN DIRECT: 0.1 mg/dL (ref 0.0–0.3)
BILIRUBIN TOTAL: 0.8 mg/dL (ref 0.2–1.2)
Total Protein: 7.5 g/dL (ref 6.0–8.3)

## 2014-01-28 LAB — LIPID PANEL
CHOLESTEROL: 175 mg/dL (ref 0–200)
HDL: 54.1 mg/dL (ref >39.00)
LDL Cholesterol: 94 mg/dL (ref 0–99)
NonHDL: 120.9
Total CHOL/HDL Ratio: 3
Triglycerides: 133 mg/dL (ref 0.0–149.0)
VLDL: 26.6 mg/dL (ref 0.0–40.0)

## 2014-01-28 LAB — CBC WITH DIFFERENTIAL/PLATELET
BASOS ABS: 0 10*3/uL (ref 0.0–0.1)
Basophils Relative: 0.4 % (ref 0.0–3.0)
EOS PCT: 5.1 % — AB (ref 0.0–5.0)
Eosinophils Absolute: 0.2 10*3/uL (ref 0.0–0.7)
HCT: 41.8 % (ref 39.0–52.0)
Hemoglobin: 13.7 g/dL (ref 13.0–17.0)
LYMPHS PCT: 24.3 % (ref 12.0–46.0)
Lymphs Abs: 1 10*3/uL (ref 0.7–4.0)
MCHC: 32.8 g/dL (ref 30.0–36.0)
MCV: 87.9 fl (ref 78.0–100.0)
MONOS PCT: 10.9 % (ref 3.0–12.0)
Monocytes Absolute: 0.5 10*3/uL (ref 0.1–1.0)
NEUTROS PCT: 59.3 % (ref 43.0–77.0)
Neutro Abs: 2.6 10*3/uL (ref 1.4–7.7)
PLATELETS: 163 10*3/uL (ref 150.0–400.0)
RBC: 4.76 Mil/uL (ref 4.22–5.81)
RDW: 15 % (ref 11.5–15.5)
WBC: 4.3 10*3/uL (ref 4.0–10.5)

## 2014-01-28 LAB — PSA: PSA: 4.17 ng/mL — ABNORMAL HIGH (ref 0.10–4.00)

## 2014-01-28 LAB — BASIC METABOLIC PANEL
BUN: 16 mg/dL (ref 6–23)
CHLORIDE: 101 meq/L (ref 96–112)
CO2: 23 meq/L (ref 19–32)
Calcium: 9.8 mg/dL (ref 8.4–10.5)
Creatinine, Ser: 1.6 mg/dL — ABNORMAL HIGH (ref 0.4–1.5)
GFR: 54.3 mL/min — ABNORMAL LOW (ref >60.00)
GLUCOSE: 104 mg/dL — AB (ref 70–99)
POTASSIUM: 3.7 meq/L (ref 3.5–5.1)
SODIUM: 138 meq/L (ref 135–145)

## 2014-01-28 LAB — HEMOGLOBIN A1C: Hgb A1c MFr Bld: 6.7 % — ABNORMAL HIGH (ref 4.6–6.5)

## 2014-01-28 LAB — TSH: TSH: 3.89 u[IU]/mL (ref 0.35–4.50)

## 2014-01-28 MED ORDER — CLONAZEPAM 1 MG PO TABS: ORAL_TABLET | ORAL | Status: DC

## 2014-01-28 NOTE — Progress Notes (Signed)
Pre visit review using our clinic review tool, if applicable. No additional management support is needed unless otherwise documented below in the visit note. 

## 2014-01-28 NOTE — Patient Instructions (Signed)

## 2014-01-28 NOTE — Assessment & Plan Note (Signed)
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

## 2014-01-28 NOTE — Assessment & Plan Note (Signed)
Here for medicare wellness/physical  Diet: heart healthy  Physical activity: not sedentary  Depression/mood screen: negative  Hearing: intact to whispered voice  Visual acuity: grossly normal, performs annual eye exam  ADLs: capable  Fall risk: none  Home safety: good  Cognitive evaluation: intact to orientation, naming, recall and repetition  EOL planning: adv directives, full code/ I agree  I have personally reviewed and have noted  1. The patient's medical and social history  2. Their use of alcohol, tobacco or illicit drugs  3. Their current medications and supplements  4. The patient's functional ability including ADL's, fall risks, home safety risks and hearing or visual impairment.  5. Diet and physical activities  6. Evidence for depression or mood disorders    Today patient counseled on age appropriate routine health concerns for screening and prevention, each reviewed and up to date or declined. Immunizations reviewed and up to date or declined. Labs ordered and reviewed. Risk factors for depression reviewed and negative. Hearing function and visual acuity are intact. ADLs screened and addressed as needed. Functional ability and level of safety reviewed and appropriate. Education, counseling and referrals performed based on assessed risks today. Patient provided with a copy of personalized plan for preventive services.   Zostavax discussed

## 2014-01-28 NOTE — Assessment & Plan Note (Signed)
Continue with current prescription therapy as reflected on the Med list.  

## 2014-01-28 NOTE — Progress Notes (Signed)
Patient ID: Aaron [name], male   DOB: [birth year], 78 y.o.   MRN: [id number]   Subjective:    HPI  The patient is here for a wellness exam. The patient has been doing well overall without major physical or psychological issues going on lately. The patient needs to address  chronic hypothyroidism that has been well controlled with medicines; to address chronic  hyperlipidemia controlled with medicines as well; and to address anxiety controlled with medical treatment and diet.   F/u COPD.... No dysphagia. No hiccups. F/u wt loss - gained wt F/u esoph tumor - doing well     Review of Systems  Constitutional: Negative for appetite change, fatigue and unexpected weight change.  HENT: Negative for congestion, nosebleeds, sneezing, sore throat and trouble swallowing.   Eyes: Negative for itching and visual disturbance.  Respiratory: Negative for cough.   Cardiovascular: Negative for chest pain, palpitations and leg swelling.  Gastrointestinal: Negative for nausea, diarrhea, blood in stool and abdominal distention.  Genitourinary: Negative for frequency and hematuria.  Musculoskeletal: Negative for back pain, joint swelling, gait problem and neck pain.  Skin: Negative for rash.  Neurological: Negative for dizziness, tremors, speech difficulty and weakness.  Psychiatric/Behavioral: Negative for suicidal ideas, sleep disturbance, dysphoric mood and agitation. The patient is not nervous/anxious.     Wt Readings from Last 3 Encounters:  01/28/14 211 lb (95.709 kg)  12/17/13 213 lb 11.2 oz (96.934 kg)  10/22/13 209 lb 12.8 oz (95.165 kg)   BP Readings from Last 3 Encounters:  01/28/14 120/88  12/17/13 110/76  10/22/13 119/72        Objective:   Physical Exam  Constitutional: He is oriented to person, place, and time. He appears well-developed and well-nourished. No distress.  HENT:  Head: Normocephalic and atraumatic.  Right Ear: External ear normal.  Left Ear: External ear  normal.  Nose: Nose normal.  Mouth/Throat: Oropharynx is clear and moist. No oropharyngeal exudate.  Eyes: Conjunctivae and EOM are normal. Pupils are equal, round, and reactive to light. Right eye exhibits no discharge. Left eye exhibits no discharge. No scleral icterus.  Neck: Normal range of motion. Neck supple. No JVD present. No tracheal deviation present. No thyromegaly present.  Cardiovascular: Normal rate, regular rhythm, normal heart sounds and intact distal pulses.  Exam reveals no gallop and no friction rub.   No murmur heard. Pulmonary/Chest: Effort normal and breath sounds normal. No stridor. No respiratory distress. He has no wheezes. He has no rales. He exhibits no tenderness.  Abdominal: Soft. Bowel sounds are normal. He exhibits no distension and no mass. There is no tenderness. There is no rebound and no guarding.  Genitourinary: Rectum normal, prostate normal and penis normal. Guaiac negative stool. No penile tenderness.  Musculoskeletal: Normal range of motion. He exhibits no edema or tenderness.  Lymphadenopathy:    He has no cervical adenopathy.  Neurological: He is alert and oriented to person, place, and time. He has normal reflexes. No cranial nerve deficit. He exhibits normal muscle tone. Coordination normal.  Skin: Skin is warm and dry. No rash noted. He is not diaphoretic. No erythema. No pallor.  Psychiatric: He has a normal mood and affect. His behavior is normal. Judgment and thought content normal.    Lab Results  Component Value Date   WBC 4.6 01/29/2013   HGB 13.4 01/29/2013   HCT 39.7 01/29/2013   PLT 260.0 01/29/2013   GLUCOSE 88 07/23/2013   CHOL 151 01/29/2013   TRIG 140.0 01/29/2013  HDL 45.40 01/29/2013   LDLCALC 78 01/29/2013   ALT 11 01/29/2013   AST 25 01/29/2013   NA 135 07/23/2013   K 4.4 07/23/2013   CL 99 07/23/2013   CREATININE 1.5 07/23/2013   BUN 15 07/23/2013   CO2 28 07/23/2013   TSH 2.80 07/23/2013   PSA 3.28 01/29/2013    INR 1.19 04/28/2009   HGBA1C 6.5 07/23/2013            Assessment & Plan:

## 2014-01-29 ENCOUNTER — Telehealth: Payer: Self-pay | Admitting: Internal Medicine

## 2014-01-29 NOTE — Telephone Encounter (Signed)
emmi mailed  °

## 2014-03-05 ENCOUNTER — Other Ambulatory Visit: Payer: Self-pay | Admitting: Internal Medicine

## 2014-04-22 ENCOUNTER — Ambulatory Visit (HOSPITAL_BASED_OUTPATIENT_CLINIC_OR_DEPARTMENT_OTHER): Payer: Medicare Other | Admitting: Oncology

## 2014-04-22 ENCOUNTER — Telehealth: Payer: Self-pay | Admitting: Oncology

## 2014-04-22 VITALS — BP 119/75 | HR 72 | Temp 97.7°F | Resp 18 | Ht 75.0 in | Wt 212.0 lb

## 2014-04-22 DIAGNOSIS — Z8501 Personal history of malignant neoplasm of esophagus: Secondary | ICD-10-CM

## 2014-04-22 DIAGNOSIS — C159 Malignant neoplasm of esophagus, unspecified: Secondary | ICD-10-CM

## 2014-04-22 NOTE — Progress Notes (Signed)
  Aaron [name] OFFICE PROGRESS NOTE   Diagnosis: Esophagus cancer  INTERVAL HISTORY:   Aaron [name] returns as scheduled. He feels well. No dysphagia. Occasional discomfort in the left upper neck when he yawns. He continues working.  Objective:  Vital signs in last 24 hours:  Blood pressure 119/75, pulse 72, temperature 97.7 F (36.5 C), temperature source Oral, resp. rate 18, height 6\' 3"  (1.905 m), weight 212 lb (96.163 kg).    HEENT: Whitish discoloration at the lower posterior buccal mucosa bilaterally. Oropharynx without visible mass Lymphatics: No cervical, supraclavicular, or axillary nodes Resp: Lungs clear bilaterally Cardio: Regular rate and rhythm GI: No hepatomegaly, nontender, no mass Vascular: Leg edema   Medications: I have reviewed the patient's current medications.  Assessment/Plan: 1. Squamous cell carcinoma of the esophagus, status post an endoscopic biopsy, 04/07/2009, with the pathology confirming at least superficial invasion. 2. PET scan, 04/20/2009, confirmed hypermetabolic activity associated with the upper and thoracic esophagus mass with a single hypermetabolic right paratracheal lymph node. He began concurrent radiation and weekly Taxol/carboplatin chemotherapy, 05/03/2009. The last treatment with Taxol/carboplatin was given 06/14/2009. He completed radiation on 06/16/2009.  3. Solid/liquid dysphagia secondary to the proximal esophageal mass, resolved. He is status post an esophageal dilatation for management of a stricture, 08/24/2009, repeat esophageal dilatation 10/03/2011. 4. Borderline enlarged mediastinal and gastrohepatic lymph nodes on staging CT scans. PET scan 04/20/2009 confirmed hypermetabolic activity associated with the right paratracheal lymph node. 5. Hypertension. 6. Status post placement of a feeding jejunostomy tube by Dr. Johney Maine. The jejunostomy tube has been removed. 7. Admission with neutropenia and anemia, March 2011.   8. Fever of unknown origin, April 2011, resolved. He was treated with prednisone beginning on 07/13/2009 for presumed radiation pneumonitis. He developed episodes of rigors on prednisone, and the prednisone was discontinued, 07/15/2009. A CT scan did not reveal evidence for an infectious process. 9. Zoster rash at the right back and anterior chest July 2013. 10. Questionable leukoplakia on exam 04/09/2013-he was evaluated by dental medicine    Disposition:  He remains in clinical remission from esophagus cancer. He would like to continue follow[facility]up at the [facility]. Aaron [name] will return for an office visit in one year. I suspect  the symmetric white areas at the posterior buccal mucosa is a benign finding.  [name], MD  04/22/2014  4:13 PM

## 2014-04-22 NOTE — Telephone Encounter (Signed)
S/w pt confirming ML visit per 01/28 POF, mailed schedule to pt confirming address.... KJ

## 2014-05-13 DIAGNOSIS — H40023 Open angle with borderline findings, high risk, bilateral: Secondary | ICD-10-CM | POA: Diagnosis not present

## 2014-05-13 DIAGNOSIS — H2513 Age-related nuclear cataract, bilateral: Secondary | ICD-10-CM | POA: Diagnosis not present

## 2014-05-13 DIAGNOSIS — H35033 Hypertensive retinopathy, bilateral: Secondary | ICD-10-CM | POA: Diagnosis not present

## 2014-05-13 DIAGNOSIS — H35372 Puckering of macula, left eye: Secondary | ICD-10-CM | POA: Diagnosis not present

## 2014-05-18 ENCOUNTER — Other Ambulatory Visit: Payer: Self-pay | Admitting: Internal Medicine

## 2014-07-09 ENCOUNTER — Other Ambulatory Visit: Payer: Self-pay | Admitting: Internal Medicine

## 2014-07-29 ENCOUNTER — Encounter: Payer: Self-pay | Admitting: Gastroenterology

## 2014-07-29 ENCOUNTER — Encounter: Payer: Self-pay | Admitting: Internal Medicine

## 2014-07-29 ENCOUNTER — Ambulatory Visit (INDEPENDENT_AMBULATORY_CARE_PROVIDER_SITE_OTHER): Payer: Medicare Other | Admitting: Internal Medicine

## 2014-07-29 VITALS — BP 128/84 | HR 72 | Wt 209.0 lb

## 2014-07-29 DIAGNOSIS — K2100 Gastro-esophageal reflux disease with esophagitis, without bleeding: Secondary | ICD-10-CM

## 2014-07-29 DIAGNOSIS — I2583 Coronary atherosclerosis due to lipid rich plaque: Principal | ICD-10-CM

## 2014-07-29 DIAGNOSIS — E034 Atrophy of thyroid (acquired): Secondary | ICD-10-CM

## 2014-07-29 DIAGNOSIS — E038 Other specified hypothyroidism: Secondary | ICD-10-CM

## 2014-07-29 DIAGNOSIS — N320 Bladder-neck obstruction: Secondary | ICD-10-CM

## 2014-07-29 DIAGNOSIS — R739 Hyperglycemia, unspecified: Secondary | ICD-10-CM

## 2014-07-29 DIAGNOSIS — E785 Hyperlipidemia, unspecified: Secondary | ICD-10-CM

## 2014-07-29 DIAGNOSIS — I2510 Atherosclerotic heart disease of native coronary artery without angina pectoris: Secondary | ICD-10-CM

## 2014-07-29 DIAGNOSIS — I10 Essential (primary) hypertension: Secondary | ICD-10-CM | POA: Diagnosis not present

## 2014-07-29 DIAGNOSIS — C154 Malignant neoplasm of middle third of esophagus: Secondary | ICD-10-CM

## 2014-07-29 MED ORDER — CLONAZEPAM 1 MG PO TABS: ORAL_TABLET | ORAL | Status: DC

## 2014-07-29 NOTE — Assessment & Plan Note (Signed)
Chronic  On Simvastatin 

## 2014-07-29 NOTE — Assessment & Plan Note (Signed)
labs

## 2014-07-29 NOTE — Assessment & Plan Note (Signed)
On ASA, Simvastatin 

## 2014-07-29 NOTE — Assessment & Plan Note (Signed)
BP Readings from Last 3 Encounters:  07/29/14 128/84  04/22/14 119/75  01/28/14 120/88  On diet

## 2014-07-29 NOTE — Assessment & Plan Note (Signed)
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

## 2014-07-29 NOTE — Assessment & Plan Note (Signed)
Using TUMs

## 2014-07-29 NOTE — Progress Notes (Signed)
Pre visit review using our clinic review tool, if applicable. No additional management support is needed unless otherwise documented below in the visit note. 

## 2014-07-29 NOTE — Assessment & Plan Note (Signed)
On Levothroid 

## 2014-08-05 ENCOUNTER — Other Ambulatory Visit (INDEPENDENT_AMBULATORY_CARE_PROVIDER_SITE_OTHER): Payer: Medicare Other

## 2014-08-05 DIAGNOSIS — K2100 Gastro-esophageal reflux disease with esophagitis, without bleeding: Secondary | ICD-10-CM

## 2014-08-05 DIAGNOSIS — N320 Bladder-neck obstruction: Secondary | ICD-10-CM | POA: Diagnosis not present

## 2014-08-05 DIAGNOSIS — C154 Malignant neoplasm of middle third of esophagus: Secondary | ICD-10-CM

## 2014-08-05 DIAGNOSIS — E038 Other specified hypothyroidism: Secondary | ICD-10-CM

## 2014-08-05 DIAGNOSIS — I2583 Coronary atherosclerosis due to lipid rich plaque: Secondary | ICD-10-CM

## 2014-08-05 DIAGNOSIS — I2510 Atherosclerotic heart disease of native coronary artery without angina pectoris: Secondary | ICD-10-CM | POA: Diagnosis not present

## 2014-08-05 DIAGNOSIS — I10 Essential (primary) hypertension: Secondary | ICD-10-CM | POA: Diagnosis not present

## 2014-08-05 DIAGNOSIS — E785 Hyperlipidemia, unspecified: Secondary | ICD-10-CM

## 2014-08-05 DIAGNOSIS — R739 Hyperglycemia, unspecified: Secondary | ICD-10-CM

## 2014-08-05 DIAGNOSIS — E034 Atrophy of thyroid (acquired): Secondary | ICD-10-CM

## 2014-08-05 LAB — BASIC METABOLIC PANEL
BUN: 17 mg/dL (ref 6–23)
CO2: 30 mEq/L (ref 19–32)
Calcium: 10.2 mg/dL (ref 8.4–10.5)
Chloride: 100 mEq/L (ref 96–112)
Creatinine, Ser: 1.43 mg/dL (ref 0.40–1.50)
GFR: 61.73 mL/min (ref >60.00)
Glucose, Bld: 109 mg/dL — ABNORMAL HIGH (ref 70–99)
Potassium: 4.1 mEq/L (ref 3.5–5.1)
Sodium: 137 mEq/L (ref 135–145)

## 2014-08-05 LAB — CBC WITH DIFFERENTIAL/PLATELET
BASOS ABS: 0 10*3/uL (ref 0.0–0.1)
BASOS PCT: 0.4 % (ref 0.0–3.0)
EOS PCT: 4.7 % (ref 0.0–5.0)
Eosinophils Absolute: 0.2 10*3/uL (ref 0.0–0.7)
HCT: 41.7 % (ref 39.0–52.0)
Hemoglobin: 14 g/dL (ref 13.0–17.0)
LYMPHS PCT: 28.6 % (ref 12.0–46.0)
Lymphs Abs: 1.4 10*3/uL (ref 0.7–4.0)
MCHC: 33.6 g/dL (ref 30.0–36.0)
MCV: 86.7 fl (ref 78.0–100.0)
MONOS PCT: 12.2 % — AB (ref 3.0–12.0)
Monocytes Absolute: 0.6 10*3/uL (ref 0.1–1.0)
NEUTROS ABS: 2.7 10*3/uL (ref 1.4–7.7)
Neutrophils Relative %: 54.1 % (ref 43.0–77.0)
Platelets: 219 10*3/uL (ref 150.0–400.0)
RBC: 4.81 Mil/uL (ref 4.22–5.81)
RDW: 14.5 % (ref 11.5–15.5)
WBC: 4.9 10*3/uL (ref 4.0–10.5)

## 2014-08-05 LAB — URINALYSIS
Bilirubin Urine: NEGATIVE
Hgb urine dipstick: NEGATIVE
Ketones, ur: NEGATIVE
Leukocytes, UA: NEGATIVE
Nitrite: NEGATIVE
SPECIFIC GRAVITY, URINE: 1.015 (ref 1.000–1.030)
Total Protein, Urine: NEGATIVE
URINE GLUCOSE: NEGATIVE
Urobilinogen, UA: 0.2 (ref 0.0–1.0)
pH: 6 (ref 5.0–8.0)

## 2014-08-05 LAB — HEPATIC FUNCTION PANEL
ALT: 13 U/L (ref 0–53)
AST: 24 U/L (ref 0–37)
Albumin: 4.1 g/dL (ref 3.5–5.2)
Alkaline Phosphatase: 82 U/L (ref 39–117)
BILIRUBIN TOTAL: 0.4 mg/dL (ref 0.2–1.2)
Bilirubin, Direct: 0.1 mg/dL (ref 0.0–0.3)
Total Protein: 7.8 g/dL (ref 6.0–8.3)

## 2014-08-05 LAB — LIPID PANEL
CHOL/HDL RATIO: 3
Cholesterol: 164 mg/dL (ref 0–200)
HDL: 57.2 mg/dL (ref >39.00)
LDL Cholesterol: 87 mg/dL (ref 0–99)
NonHDL: 106.8
TRIGLYCERIDES: 100 mg/dL (ref 0.0–149.0)
VLDL: 20 mg/dL (ref 0.0–40.0)

## 2014-08-05 LAB — PSA: PSA: 7.52 ng/mL — ABNORMAL HIGH (ref 0.10–4.00)

## 2014-08-05 LAB — HEMOGLOBIN A1C: HEMOGLOBIN A1C: 6.7 % — AB (ref 4.6–6.5)

## 2014-08-05 LAB — TSH: TSH: 4.45 u[IU]/mL (ref 0.35–4.50)

## 2014-08-25 ENCOUNTER — Encounter: Payer: Self-pay | Admitting: Gastroenterology

## 2014-08-30 ENCOUNTER — Telehealth: Payer: Self-pay | Admitting: Internal Medicine

## 2014-08-30 NOTE — Telephone Encounter (Signed)
Please call patient regarding his lab work that was done last month.

## 2014-08-31 NOTE — Telephone Encounter (Signed)
08/05/14 labs mailed to pt per his request.

## 2014-09-01 ENCOUNTER — Other Ambulatory Visit: Payer: Self-pay | Admitting: Internal Medicine

## 2014-09-16 DIAGNOSIS — H40023 Open angle with borderline findings, high risk, bilateral: Secondary | ICD-10-CM | POA: Diagnosis not present

## 2014-10-14 ENCOUNTER — Encounter: Payer: Self-pay | Admitting: Gastroenterology

## 2014-10-14 ENCOUNTER — Ambulatory Visit (INDEPENDENT_AMBULATORY_CARE_PROVIDER_SITE_OTHER): Payer: Medicare Other | Admitting: Gastroenterology

## 2014-10-14 VITALS — BP 96/68 | HR 88 | Ht 74.0 in | Wt 205.2 lb

## 2014-10-14 DIAGNOSIS — Z8501 Personal history of malignant neoplasm of esophagus: Secondary | ICD-10-CM | POA: Diagnosis not present

## 2014-10-14 DIAGNOSIS — K2100 Gastro-esophageal reflux disease with esophagitis, without bleeding: Secondary | ICD-10-CM

## 2014-10-14 DIAGNOSIS — I2510 Atherosclerotic heart disease of native coronary artery without angina pectoris: Secondary | ICD-10-CM

## 2014-10-14 NOTE — Progress Notes (Signed)
    History of Present Illness: This is a 79 year old male with a history of squamous cell esophageal cancer currently in remission and GERD with erosive esophagitis and proximal esophageal stricture. He underwent EGD with dilation in July 2013. He notes a recent flare in reflux symptoms when eating fried foods and she has reduced fried food intake his reflux symptoms have come under very good control. He denies dysphagia, abdominal pain, weight loss, chest pain.   Current Medications, Allergies, Past Medical History, Past Surgical History, Family History and Social History were reviewed in [employer] record.  Physical Exam: General: Well developed , well nourished, no acute distress Head: Normocephalic and atraumatic Eyes:  sclerae anicteric, EOMI Ears: Normal auditory acuity Mouth: No deformity or lesions Lungs: Clear throughout to auscultation Heart: Regular rate and rhythm; no murmurs, rubs or bruits Abdomen: Soft, non tender and non distended. No masses, hepatosplenomegaly or hernias noted. Normal Bowel sounds Musculoskeletal: Symmetrical with no gross deformities  Pulses:  Normal pulses noted Extremities: No clubbing, cyanosis, edema or deformities noted Neurological: Alert oriented x 4, grossly nonfocal Psychological:  Alert and cooperative. Normal mood and affect  Assessment and Recommendations:  1. GERD with erosive esophagitis and history of a proximal esophageal stricture. History of squamous cell esophageal cancer in remission. Continue standard antireflux measures and omeprazole 40 mg daily. Follow-up as needed.  15 minutes of face to face time spent with patient. Greater than 50% of the time was spent counseling and coordinating care.

## 2014-10-14 NOTE — Patient Instructions (Signed)
Patient advised to avoid spicy, acidic, citrus, chocolate, mints, fruit and fruit juices.  Limit the intake of caffeine, alcohol and Soda.  Don't exercise too soon after eating.  Don't lie down within 3-4 hours of eating.  Elevate the head of your bed.  Continue omeprazole daily long-term.   Thank you for choosing me and [facility].  Pricilla Riffle. Dagoberto Ligas., MD., [facility]

## 2014-11-16 ENCOUNTER — Other Ambulatory Visit: Payer: Self-pay | Admitting: Internal Medicine

## 2014-12-20 ENCOUNTER — Encounter: Payer: Self-pay | Admitting: Cardiovascular Disease

## 2014-12-20 ENCOUNTER — Ambulatory Visit (INDEPENDENT_AMBULATORY_CARE_PROVIDER_SITE_OTHER): Payer: Medicare Other | Admitting: Cardiovascular Disease

## 2014-12-20 VITALS — BP 110/64 | HR 87 | Ht 75.0 in | Wt 206.3 lb

## 2014-12-20 DIAGNOSIS — I358 Other nonrheumatic aortic valve disorders: Secondary | ICD-10-CM

## 2014-12-20 DIAGNOSIS — I2583 Coronary atherosclerosis due to lipid rich plaque: Secondary | ICD-10-CM

## 2014-12-20 DIAGNOSIS — I359 Nonrheumatic aortic valve disorder, unspecified: Secondary | ICD-10-CM

## 2014-12-20 DIAGNOSIS — I10 Essential (primary) hypertension: Secondary | ICD-10-CM | POA: Diagnosis not present

## 2014-12-20 DIAGNOSIS — I2510 Atherosclerotic heart disease of native coronary artery without angina pectoris: Secondary | ICD-10-CM

## 2014-12-20 DIAGNOSIS — M25562 Pain in left knee: Secondary | ICD-10-CM

## 2014-12-20 DIAGNOSIS — R011 Cardiac murmur, unspecified: Secondary | ICD-10-CM

## 2014-12-20 DIAGNOSIS — E785 Hyperlipidemia, unspecified: Secondary | ICD-10-CM

## 2014-12-20 NOTE — Progress Notes (Signed)
Patient ID: Aaron [name], male   DOB: [birth year], 79 y.o.   MRN: [id number]     HPI: Aaron [name] is a 79 y.o. male presents to the office today for one-year cardiology evaluation.  Aaron [name] has a history of mild CAD with 30% ostial LAD stenosis and 20% OM1 narrowing noted at cardiac catheterization in June 2010. Additional problems include hypertension with mild diastolic dysfunction, hyperlipidemia, and history of esophageal cancer, status post chemotherapy radiation therapy, history of hypothyroidism for which he is on thyroid replacement therapy. Over the past year, Aaron [name] has continued to do well. 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  He denies any change in exercise tolerance.  He denies chest pressure.  He denies palpitations.  He denies presyncope or syncope.  He denies edema.  He does have a history of diverticulosis and mild COPD.  He tells me he recently saw [name] who felt that he was stable with reference to his remote esophageal CA and that there was no need to do any repeat endoscopy.  He had laboratory done in May 2016 which showed a cholesterol 164 HDL 57, LDL 87, triglycerides 100.  He also is a history of hypothyroidism and has been on thyroid replacement medication.  He presents for one-year evaluation.   Past Medical History  Diagnosis Date  . Diverticulosis of colon   . Gastritis   . Hemorrhoids   . GERD (gastroesophageal reflux disease)   . Hiatal hernia   . Esophageal stricture   . Hyperlipidemia     [name]  . HTN (hypertension)     mild diastolic dysfunction   . Elevated glucose   . COPD (chronic obstructive pulmonary disease)   . 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 History of radiation therapy 05/03/09-06/16/09    squamous cell ca of esophagus  . Hypothyroid     s/p radiotherapy  . Arrhythmia   . Allergic  rhinitis   . History of chemotherapy 05/03/09-06/16/09    Taxol/carboplatin  . CAD (coronary artery disease)     mild  . History of nuclear stress test 07/2008    exercise; mild-mod perfusion dfect in basal inf/mid inf/apical inf regions; EKG negative for ischemia    Past Surgical History  Procedure Laterality Date  . Lower back cyst removed      [facility]  . Creation of cutaneous stoma  2011  . Tonsillectomy      age 22  . Esophagogastroduodenoscopy  10/03/2011    Procedure: ESOPHAGOGASTRODUODENOSCOPY (EGD);  Surgeon: [name], MD,FACG;  Location: [name];  Service: Endoscopy;  Laterality: N/A;  no fluro needed  . [name] dilation  10/03/2011    Procedure: [name] DILATION;  Surgeon: [name], MD,FACG;  Location: [facility];  Service: Endoscopy;  Laterality: N/A;  . Cardiac catheterization  08/2008    mild nonobstructive CAD with 30% LAD stenosis prox & 20% OM1 narrowing   . Transthoracic echocardiogram  06/2009    EF=>55%; trace MR/TR    Allergies  Allergen Reactions  . Amoxicillin     REACTION: rash  . Cefuroxime Axetil     REACTION: nit feeling well  . Fluconazole     REACTION: shaking    Current Outpatient Prescriptions  Medication Sig Dispense Refill  . aspirin 81 MG EC tablet Take 81 mg by  mouth daily.      . calcium carbonate (TUMS - DOSED IN MG ELEMENTAL CALCIUM) 500 MG chewable tablet Chew 2 tablets by mouth 2 (two) times daily as needed. Acid reflux/gas    . Cholecalciferol 1000 UNITS tablet Take 1,000 Units by mouth daily.      . clonazePAM (KLONOPIN) 1 MG tablet TAKE 1/2 TO 1 TABLET BY MOUTH TWICE DAILY AS NEEDED FOR ANXIETY 60 tablet 3  . clotrimazole-betamethasone (LOTRISONE) cream APPLY TOPICALLY TWICE DAILY 15 g 2  . fish oil-omega-3 fatty acids 1000 MG capsule Take 1 g by mouth daily.    Marland Kitchen guaiFENesin (MUCINEX) 600 MG 12 hr tablet Take 1,200 mg by mouth as needed for congestion.    . Ipratropium-Albuterol (COMBIVENT IN) Inhale into the lungs  as needed.    Marland Kitchen levothyroxine (SYNTHROID, LEVOTHROID) 112 MCG tablet TAKE 1 TABLET BY MOUTH DAILY 90 tablet 1  . loratadine (CLARITIN) 10 MG tablet Take 10 mg by mouth daily.     . Mometasone Furo-Formoterol Fum (DULERA IN) Inhale into the lungs as needed.    Marland Kitchen omeprazole (PRILOSEC) 40 MG capsule TAKE ONE CAPSULE BY MOUTH DAILY 90 capsule 2  . simvastatin (ZOCOR) 20 MG tablet TAKE 1 TABLET BY MOUTH DAILY 90 tablet 0   No current facility-administered medications for this visit.    Social History   Social History  . Marital Status: Married    Spouse Name: N/A  . Number of Children: 3  . Years of Education: 12th   Occupational History  . 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 Smoking status: Former Smoker -- 1.00 packs/day for 40 years    Quit date: 01/16/2001  . Smokeless tobacco: Never Used     Comment: Quit 2002  . Alcohol Use: 0.0 oz/week    0 Standard drinks or equivalent per week     Comment: occasional drink 2-3 a month  . Drug Use: No     Comment: quit smoking 2002  . Sexual Activity: Yes   Other Topics Concern  . Not on file   Social History Narrative   Daily Caffeine - 1          Family History  Problem Relation Age of Onset  . Stroke Maternal Grandfather 35  . Prostate cancer Father     66's  . Diabetes Father   . Prostate cancer Brother 105  . Prostate cancer Brother 53  . Coronary artery disease Neg Hx   . Colon cancer Sister   . Hypertension Neg Hx   . Stroke Mother     aneurism    ROS General: Negative; No fevers, chills, or night sweats;  HEENT: Negative; No changes in vision or hearing, sinus congestion, difficulty swallowing Pulmonary: Negative; No cough, wheezing, shortness of breath, hemoptysis Cardiovascular: Negative; No chest pain, presyncope, syncope, palpitations.  No claudication GI: Remote history of esophageal cancer, status post chemotherapy and radiation treatment GU: Negative; No  dysuria, hematuria, or difficulty voiding Musculoskeletal: Negative; no myalgias, joint pain, or weakness Hematologic/Oncology: Negative; no easy bruising, bleeding Endocrine: Positive for hypothyroidism on Synthroid replacement Neuro: Negative; no changes in balance, headaches Skin: Negative; No rashes or skin lesions Psychiatric: Negative; No behavioral problems, depression Sleep: Negative; No snoring, daytime sleepiness, hypersomnolence, bruxism, restless legs, hypnogognic hallucinations, no cataplexy Other comprehensive 14 point system review is negative.  PE BP 110/64 mmHg  Pulse 87  Ht _0  (1.905 m)  Wt 206 lb 4.8 oz (93.577 kg)  BMI 25.79 kg/m2  Wt Readings from Last 3 Encounters:  12/20/14 206 lb 4.8 oz (93.577 kg)  10/14/14 205 lb 4 oz (93.101 kg)  07/29/14 209 lb (94.802 kg)   Repeat blood pressure by me was 112/64 supine and 102/60 standing. General: Alert, oriented, no distress.  Skin: normal turgor, no rashes HEENT: Normocephalic, atraumatic. Pupils round and reactive; sclera anicteric;no lid lag.  Nose without nasal septal hypertrophy Mouth/Parynx benign; Mallinpatti scale 2 Neck: No JVD, no carotid bruits with normal carotid upstroke. Lungs: clear to ausculatation and percussion; no wheezing or rales Chest wall: Nontender to palpation Heart: RRR, s1 s2 normal; slightly progressive 6-1/9 /6 systolic murmur in the aortic area; no diastolic murmur.  No S3 or S4 gallop.  No rubs, murmurs or heaves  Abdomen: Very mild diastases recti; soft, nontender; no hepatosplenomehaly, BS+; abdominal aorta nontender and not dilated by palpation. Back: No CVA tenderness  Pulses 2+ Extremities: no clubbing cyanosis or edema, Homan's sign negative  Neurologic: grossly nonfocal; cranial nerves grossly normal.   Psychological: Normal cognitive 100 normal affect and mood.  ECG (independently read by me): Normal sinus rhythm at 87 bpm.  No ectopy.  Normal intervals.  No ST segment  changes.  12/17/2013 ECG (independently read by me): Normal sinus rhythm at 65 beats per minute.  Normal intervals.  No ST segment changes.  Prior September 2014 ECG: Normal sinus rhythm at 60 beats per minute. Intervals are normal.  LABS:  BMP Latest Ref Rng 08/05/2014 01/28/2014 07/23/2013  Glucose 70 - 99 mg/dL 109(H) 104(H) 88  BUN 6 - 23 mg/dL _0 Creatinine 0.40 - 1.50 mg/dL 1.43 1.6(H) 1.5  Sodium 135 - 145 mEq/L 137 138 135  Potassium 3.5 - 5.1 mEq/L 4.1 3.7 4.4  Chloride 96 - 112 mEq/L 100 101 99  CO2 19 - 32 mEq/L _1 Calcium 8.4 - 10.5 mg/dL 10.2 9.8 9.7   Hepatic Function Latest Ref Rng 08/05/2014 01/28/2014 01/29/2013  Total Protein 6.0 - 8.3 g/dL 7.8 7.5 7.8  Albumin 3.5 - 5.2 g/dL 4.1 3.6 4.0  AST 0 - 37 U/L _2 ALT 0 - 53 U/L _3 Alk Phosphatase 39 - 117 U/L 82 65 66  Total Bilirubin 0.2 - 1.2 mg/dL 0.4 0.8 0.4  Bilirubin, Direct 0.0 - 0.3 mg/dL 0.1 0.1 0.1   CBC Latest Ref Rng 08/05/2014 01/28/2014 01/29/2013  WBC 4.0 - 10.5 K/uL 4.9 4.3 4.6  Hemoglobin 13.0 - 17.0 g/dL 14.0 13.7 13.4  Hematocrit 39.0 - 52.0 % 41.7 41.8 39.7  Platelets 150.0 - 400.0 K/uL 219.0 163.0 260.0   Lab Results  Component Value Date   MCV 86.7 08/05/2014   MCV 87.9 01/28/2014   MCV 85.8 01/29/2013   Lab Results  Component Value Date   TSH 4.45 08/05/2014   Lab Results  Component Value Date   HGBA1C 6.7* 08/05/2014   Lipid Panel     Component Value Date/Time   CHOL 164 08/05/2014 0731   TRIG 100.0 08/05/2014 0731   TRIG 120 02/26/2006 1420   HDL 57.20 08/05/2014 0731   CHOLHDL 3 08/05/2014 0731   CHOLHDL 3.3 CALC 02/26/2006 1420   VLDL 20.0 08/05/2014 0731   LDLCALC 87 08/05/2014 0731     ASSESSMENT AND PLAN: Mr. Mccalla is a 79 year old African-American male who has documented mild coronary obstructive disease by cardiac catheterization in June 2010 for which he  is been on medical therapy.  He continues to be anginal.  3 and denies any change in  symptomatology with reference to exertional chest pressure or shortness of breath.  He has a history of hypertension, with mild diastolic dysfunction, as well as hyperlipidemia.  His blood pressure today is stable.  I reviewed laboratory from May 2016.  Total cholesterol was 164.  His serum creatinine had improved from 1.6 in November 2015 and was now 1.43.  He was not anemic.  Hemoglobin A1c was 6.7.  He is followed by [name] for primary medical care, Dr. Diona Foley oncology and [name] for GI.  His ECG remained stable.  Of note, his PSA was elevated when checked by [name] and apparently a follow-up appointment is scheduled for next week.  His cardiac murmur is slightly increased from one year ago and most likely is aortic valve sclerosis versus development of mild aortic stenosis.  His last echo Doppler study was in 2011.  I'm scheduling him for follow-up echo Doppler study for further evaluation which will also be helpful to assess systolic and diastolic function.  I will see him in 6 months for cardiology reevaluation or sooner if problems arise.  Time spent: 25 minutes  Troy Sine, MD, [facility]  12/20/2014 5:33 PM

## 2014-12-20 NOTE — Patient Instructions (Signed)
Your physician has requested that you have an echocardiogram. Echocardiography is a painless test that uses sound waves to create images of your heart. It provides your doctor with information about the size and shape of your heart and how well your heart's chambers and valves are working. This procedure takes approximately one hour. There are no restrictions for this procedure.   Your physician wants you to follow-up in: 6 months or sooner if needed. You will receive a reminder letter in the mail two months in advance. If you don't receive a letter, please call our office to schedule the follow-up appointment. 

## 2014-12-30 ENCOUNTER — Ambulatory Visit: Payer: Self-pay | Admitting: Internal Medicine

## 2014-12-30 ENCOUNTER — Other Ambulatory Visit: Payer: Self-pay

## 2014-12-30 ENCOUNTER — Ambulatory Visit (HOSPITAL_COMMUNITY): Payer: Medicare Other | Attending: Cardiovascular Disease

## 2014-12-30 DIAGNOSIS — I351 Nonrheumatic aortic (valve) insufficiency: Secondary | ICD-10-CM | POA: Diagnosis not present

## 2014-12-30 DIAGNOSIS — I359 Nonrheumatic aortic valve disorder, unspecified: Secondary | ICD-10-CM

## 2014-12-30 DIAGNOSIS — R011 Cardiac murmur, unspecified: Secondary | ICD-10-CM | POA: Insufficient documentation

## 2014-12-30 DIAGNOSIS — I2510 Atherosclerotic heart disease of native coronary artery without angina pectoris: Secondary | ICD-10-CM | POA: Diagnosis not present

## 2014-12-30 DIAGNOSIS — I371 Nonrheumatic pulmonary valve insufficiency: Secondary | ICD-10-CM | POA: Diagnosis not present

## 2014-12-30 DIAGNOSIS — I071 Rheumatic tricuspid insufficiency: Secondary | ICD-10-CM | POA: Diagnosis not present

## 2014-12-30 DIAGNOSIS — I340 Nonrheumatic mitral (valve) insufficiency: Secondary | ICD-10-CM | POA: Diagnosis not present

## 2014-12-30 DIAGNOSIS — I358 Other nonrheumatic aortic valve disorders: Secondary | ICD-10-CM

## 2015-01-01 DIAGNOSIS — Z23 Encounter for immunization: Secondary | ICD-10-CM | POA: Diagnosis not present

## 2015-01-06 ENCOUNTER — Ambulatory Visit: Payer: Self-pay | Admitting: Internal Medicine

## 2015-01-11 ENCOUNTER — Encounter: Payer: Self-pay | Admitting: *Deleted

## 2015-01-18 ENCOUNTER — Encounter: Payer: Self-pay | Admitting: *Deleted

## 2015-01-31 ENCOUNTER — Encounter: Payer: Self-pay | Admitting: Internal Medicine

## 2015-01-31 ENCOUNTER — Ambulatory Visit (INDEPENDENT_AMBULATORY_CARE_PROVIDER_SITE_OTHER): Payer: Medicare Other | Admitting: Internal Medicine

## 2015-01-31 VITALS — BP 108/68 | HR 86 | Wt 205.0 lb

## 2015-01-31 DIAGNOSIS — I2510 Atherosclerotic heart disease of native coronary artery without angina pectoris: Secondary | ICD-10-CM

## 2015-01-31 DIAGNOSIS — I10 Essential (primary) hypertension: Secondary | ICD-10-CM

## 2015-01-31 DIAGNOSIS — B079 Viral wart, unspecified: Secondary | ICD-10-CM | POA: Diagnosis not present

## 2015-01-31 DIAGNOSIS — C154 Malignant neoplasm of middle third of esophagus: Secondary | ICD-10-CM

## 2015-01-31 DIAGNOSIS — K2100 Gastro-esophageal reflux disease with esophagitis, without bleeding: Secondary | ICD-10-CM

## 2015-01-31 DIAGNOSIS — K130 Diseases of lips: Secondary | ICD-10-CM

## 2015-01-31 DIAGNOSIS — I2583 Coronary atherosclerosis due to lipid rich plaque: Secondary | ICD-10-CM

## 2015-01-31 MED ORDER — CLOTRIMAZOLE-BETAMETHASONE 1-0.05 % EX CREA: TOPICAL_CREAM | Freq: Two times a day (BID) | CUTANEOUS | Status: DC

## 2015-01-31 NOTE — Assessment & Plan Note (Signed)
NAS diet 

## 2015-01-31 NOTE — Patient Instructions (Signed)
   Postprocedure instructions :     Keep the wounds clean. You can wash them with liquid soap and water. Pat dry with gauze or a Kleenex tissue  Before applying antibiotic ointment and a Band-Aid.   You need to report immediately  if  any signs of infection develop.    

## 2015-01-31 NOTE — Assessment & Plan Note (Signed)
Dr Fuller Plan

## 2015-01-31 NOTE — Assessment & Plan Note (Signed)
Rx cream prn

## 2015-01-31 NOTE — Assessment & Plan Note (Signed)
Prilosec 

## 2015-01-31 NOTE — Progress Notes (Signed)
Pre visit review using our clinic review tool, if applicable. No additional management support is needed unless otherwise documented below in the visit note. 

## 2015-01-31 NOTE — Progress Notes (Signed)
Subjective:  Patient ID: Aaron [name], male    DOB: 12-28-1935  Age: 79 y.o. MRN: [id number]  CC: No chief complaint on file.   HPI Aaron [name] presents for HTN, hypothyroidism, GERD, h/o esoph ca f/u. C/o scalp wart.  Outpatient Prescriptions Prior to Visit  Medication Sig Dispense Refill  . aspirin 81 MG EC tablet Take 81 mg by mouth daily.      . calcium carbonate (TUMS - DOSED IN MG ELEMENTAL CALCIUM) 500 MG chewable tablet Chew 2 tablets by mouth 2 (two) times daily as needed. Acid reflux/gas    . Cholecalciferol 1000 UNITS tablet Take 1,000 Units by mouth daily.      . clonazePAM (KLONOPIN) 1 MG tablet TAKE 1/2 TO 1 TABLET BY MOUTH TWICE DAILY AS NEEDED FOR ANXIETY 60 tablet 3  . clotrimazole-betamethasone (LOTRISONE) cream APPLY TOPICALLY TWICE DAILY 15 g 2  . fish oil-omega-3 fatty acids 1000 MG capsule Take 1 g by mouth daily.    Marland Kitchen guaiFENesin (MUCINEX) 600 MG 12 hr tablet Take 1,200 mg by mouth as needed for congestion.    . Ipratropium-Albuterol (COMBIVENT IN) Inhale into the lungs as needed.    Marland Kitchen levothyroxine (SYNTHROID, LEVOTHROID) 112 MCG tablet TAKE 1 TABLET BY MOUTH DAILY 90 tablet 1  . loratadine (CLARITIN) 10 MG tablet Take 10 mg by mouth daily.     . Mometasone Furo-Formoterol Fum (DULERA IN) Inhale into the lungs as needed.    Marland Kitchen omeprazole (PRILOSEC) 40 MG capsule TAKE ONE CAPSULE BY MOUTH DAILY 90 capsule 2  . simvastatin (ZOCOR) 20 MG tablet TAKE 1 TABLET BY MOUTH DAILY 90 tablet 0   No facility-administered medications prior to visit.    ROS Review of Systems  Constitutional: Negative for appetite change, fatigue and unexpected weight change.  HENT: Negative for congestion, nosebleeds, sneezing, sore throat and trouble swallowing.   Eyes: Negative for itching and visual disturbance.  Respiratory: Negative for cough.   Cardiovascular: Negative for chest pain, palpitations and leg swelling.  Gastrointestinal: Negative for nausea, diarrhea, blood in  stool and abdominal distention.  Genitourinary: Negative for frequency and hematuria.  Musculoskeletal: Negative for back pain, joint swelling, gait problem and neck pain.  Skin: Negative for rash.  Neurological: Negative for dizziness, tremors, speech difficulty and weakness.  Psychiatric/Behavioral: Negative for sleep disturbance, dysphoric mood and agitation. The patient is not nervous/anxious.     Objective:  BP 108/68 mmHg  Pulse 86  Wt 205 lb (92.987 kg)  SpO2 98%  BP Readings from Last 3 Encounters:  01/31/15 108/68  12/20/14 110/64  10/14/14 96/68    Wt Readings from Last 3 Encounters:  01/31/15 205 lb (92.987 kg)  12/20/14 206 lb 4.8 oz (93.577 kg)  10/14/14 205 lb 4 oz (93.101 kg)    Physical Exam  Constitutional: He is oriented to person, place, and time. He appears well-developed. No distress.  NAD  HENT:  Mouth/Throat: Oropharynx is clear and moist.  Eyes: Conjunctivae are normal. Pupils are equal, round, and reactive to light.  Neck: Normal range of motion. No JVD present. No thyromegaly present.  Cardiovascular: Normal rate, regular rhythm, normal heart sounds and intact distal pulses.  Exam reveals no gallop and no friction rub.   No murmur heard. Pulmonary/Chest: Effort normal and breath sounds normal. No respiratory distress. He has no wheezes. He has no rales. He exhibits no tenderness.  Abdominal: Soft. Bowel sounds are normal. He exhibits no distension and no mass. There is no tenderness. There  is no rebound and no guarding.  Musculoskeletal: Normal range of motion. He exhibits no edema or tenderness.  Lymphadenopathy:    He has no cervical adenopathy.  Neurological: He is alert and oriented to person, place, and time. He has normal reflexes. No cranial nerve deficit. He exhibits normal muscle tone. He displays a negative Romberg sign. Coordination and gait normal.  Skin: Skin is warm and dry. No rash noted.  Psychiatric: He has a normal mood and affect.  His behavior is normal. Judgment and thought content normal.  wart on scalp  Lab Results  Component Value Date   WBC 4.9 08/05/2014   HGB 14.0 08/05/2014   HCT 41.7 08/05/2014   PLT 219.0 08/05/2014   GLUCOSE 109* 08/05/2014   CHOL 164 08/05/2014   TRIG 100.0 08/05/2014   HDL 57.20 08/05/2014   LDLCALC 87 08/05/2014   ALT 13 08/05/2014   AST 24 08/05/2014   NA 137 08/05/2014   K 4.1 08/05/2014   CL 100 08/05/2014   CREATININE 1.43 08/05/2014   BUN 17 08/05/2014   CO2 30 08/05/2014   TSH 4.45 08/05/2014   PSA 7.52* 08/05/2014   INR 1.19 04/28/2009   HGBA1C 6.7* 08/05/2014    No results found.   Procedure Note :     Procedure : Cryosurgery   Indication:  Wart(s)     Risks including unsuccessful procedure , bleeding, infection, bruising, scar, a need for a repeat  procedure and others were explained to the patient in detail as well as the benefits. Informed consent was obtained verbally.   1  lesion(s)  on  scalp  was/were treated with liquid nitrogen on a Q-tip in a usual fasion . Band-Aid was applied and antibiotic ointment was given for a later use.   Tolerated well. Complications none.    Assessment & Plan:   Diagnoses and all orders for this visit:  Wart of scalp  Coronary artery disease due to lipid rich plaque  Essential hypertension  Malignant neoplasm of thoracic esophagus (HCC)  Gastroesophageal reflux disease with esophagitis  Other orders -     clotrimazole-betamethasone (LOTRISONE) cream; Apply topically 2 (two) times daily.   I am having Mr. Czerwonka maintain his aspirin, loratadine, Cholecalciferol, calcium carbonate, Ipratropium-Albuterol (COMBIVENT IN), Mometasone Furo-Formoterol Fum (DULERA IN), guaiFENesin, fish oil-omega-3 fatty acids, clotrimazole-betamethasone, omeprazole, clonazePAM, levothyroxine, and simvastatin.  No orders of the defined types were placed in this encounter.     Follow-up: No Follow-up on file.  Walker Kehr, MD

## 2015-01-31 NOTE — Assessment & Plan Note (Signed)
See Procedure 

## 2015-01-31 NOTE — Assessment & Plan Note (Signed)
On ASA, Simvastatin 

## 2015-02-03 ENCOUNTER — Other Ambulatory Visit (INDEPENDENT_AMBULATORY_CARE_PROVIDER_SITE_OTHER): Payer: Medicare Other

## 2015-02-03 DIAGNOSIS — I10 Essential (primary) hypertension: Secondary | ICD-10-CM | POA: Diagnosis not present

## 2015-02-03 DIAGNOSIS — I2510 Atherosclerotic heart disease of native coronary artery without angina pectoris: Secondary | ICD-10-CM

## 2015-02-03 DIAGNOSIS — K2100 Gastro-esophageal reflux disease with esophagitis, without bleeding: Secondary | ICD-10-CM

## 2015-02-03 DIAGNOSIS — C154 Malignant neoplasm of middle third of esophagus: Secondary | ICD-10-CM | POA: Diagnosis not present

## 2015-02-03 DIAGNOSIS — B079 Viral wart, unspecified: Secondary | ICD-10-CM

## 2015-02-03 DIAGNOSIS — I2583 Coronary atherosclerosis due to lipid rich plaque: Secondary | ICD-10-CM

## 2015-02-03 DIAGNOSIS — K130 Diseases of lips: Secondary | ICD-10-CM

## 2015-02-03 LAB — CBC WITH DIFFERENTIAL/PLATELET
BASOS ABS: 0 10*3/uL (ref 0.0–0.1)
Basophils Relative: 0.3 % (ref 0.0–3.0)
EOS ABS: 0.3 10*3/uL (ref 0.0–0.7)
Eosinophils Relative: 5.9 % — ABNORMAL HIGH (ref 0.0–5.0)
HCT: 42.7 % (ref 39.0–52.0)
Hemoglobin: 14 g/dL (ref 13.0–17.0)
LYMPHS ABS: 1.3 10*3/uL (ref 0.7–4.0)
Lymphocytes Relative: 29.9 % (ref 12.0–46.0)
MCHC: 32.8 g/dL (ref 30.0–36.0)
MCV: 88 fl (ref 78.0–100.0)
MONO ABS: 0.5 10*3/uL (ref 0.1–1.0)
MONOS PCT: 10.9 % (ref 3.0–12.0)
Neutro Abs: 2.3 10*3/uL (ref 1.4–7.7)
Neutrophils Relative %: 53 % (ref 43.0–77.0)
Platelets: 197 10*3/uL (ref 150.0–400.0)
RBC: 4.85 Mil/uL (ref 4.22–5.81)
RDW: 14.5 % (ref 11.5–15.5)
WBC: 4.4 10*3/uL (ref 4.0–10.5)

## 2015-02-03 LAB — TSH: TSH: 2.01 u[IU]/mL (ref 0.35–4.50)

## 2015-02-03 LAB — BASIC METABOLIC PANEL
BUN: 17 mg/dL (ref 6–23)
CHLORIDE: 101 meq/L (ref 96–112)
CO2: 28 meq/L (ref 19–32)
CREATININE: 1.52 mg/dL — AB (ref 0.40–1.50)
Calcium: 10.2 mg/dL (ref 8.4–10.5)
GFR: 57.46 mL/min — ABNORMAL LOW (ref >60.00)
Glucose, Bld: 112 mg/dL — ABNORMAL HIGH (ref 70–99)
Potassium: 4.1 mEq/L (ref 3.5–5.1)
Sodium: 137 mEq/L (ref 135–145)

## 2015-02-15 ENCOUNTER — Other Ambulatory Visit: Payer: Self-pay | Admitting: Internal Medicine

## 2015-02-19 ENCOUNTER — Other Ambulatory Visit: Payer: Self-pay | Admitting: Internal Medicine

## 2015-02-21 NOTE — Telephone Encounter (Signed)
Ok to refill 

## 2015-03-09 ENCOUNTER — Other Ambulatory Visit: Payer: Self-pay | Admitting: Internal Medicine

## 2015-04-25 ENCOUNTER — Telehealth: Payer: Self-pay | Admitting: Nurse Practitioner

## 2015-04-25 ENCOUNTER — Telehealth: Payer: Self-pay | Admitting: Oncology

## 2015-04-25 ENCOUNTER — Ambulatory Visit: Payer: Self-pay | Admitting: Nurse Practitioner

## 2015-04-25 NOTE — Telephone Encounter (Signed)
PT called to schedule apt with MD, confirmed with pt office visit 02/02 w/LT/NP.Marland KitchenMarland Kitchen KJ

## 2015-04-25 NOTE — Telephone Encounter (Signed)
Returned patient call re appointments. Per patient message he called several times and no one has returned his call. Not able to reach patient or leave message.

## 2015-04-28 ENCOUNTER — Telehealth: Payer: Self-pay | Admitting: Oncology

## 2015-04-28 ENCOUNTER — Ambulatory Visit (HOSPITAL_BASED_OUTPATIENT_CLINIC_OR_DEPARTMENT_OTHER): Payer: Medicare Other | Admitting: Nurse Practitioner

## 2015-04-28 VITALS — BP 108/65 | Temp 97.8°F | Resp 18 | Wt 208.3 lb

## 2015-04-28 DIAGNOSIS — Z8501 Personal history of malignant neoplasm of esophagus: Secondary | ICD-10-CM

## 2015-04-28 DIAGNOSIS — C154 Malignant neoplasm of middle third of esophagus: Secondary | ICD-10-CM

## 2015-04-28 NOTE — Telephone Encounter (Signed)
Scheduled patient appt per pof, avs report printed.  °

## 2015-04-28 NOTE — Progress Notes (Signed)
  Aaron OFFICE PROGRESS NOTE   Diagnosis:  Esophagus cancer  INTERVAL HISTORY:   Aaron [name] returns as scheduled. He feels well. He has a good appetite. Weight is stable. He denies pain. No dysphagia. No nausea or vomiting. No change in bowel habits.  Objective:  Vital signs in last 24 hours:  Blood pressure 108/65, temperature 97.8 F (36.6 C), temperature source Oral, resp. rate 18, weight 208 lb 4.8 oz (94.484 kg), SpO2 100 %.    HEENT: Persistent whitish discoloration at the lower posterior buccal mucosa bilaterally. No visible mass within the oral cavity. Lymphatics: No palpable cervical, supra clavicular, axillary or inguinal lymph nodes. Resp: Lungs clear bilaterally. Cardio: Regular rate and rhythm. GI: Abdomen soft and nontender. No hepatomegaly. No mass. Vascular: No leg edema.  Lab Results:  Lab Results  Component Value Date   WBC 4.4 02/03/2015   HGB 14.0 02/03/2015   HCT 42.7 02/03/2015   MCV 88.0 02/03/2015   PLT 197.0 02/03/2015   NEUTROABS 2.3 02/03/2015    Imaging:  No results found.  Medications: I have reviewed the patient's current medications.  Assessment/Plan: 1. Squamous cell carcinoma of the esophagus, status post an endoscopic biopsy, 04/07/2009, with the pathology confirming at least superficial invasion. 2. PET scan, 04/20/2009, confirmed hypermetabolic activity associated with the upper and thoracic esophagus mass with a single hypermetabolic right paratracheal lymph node. He began concurrent radiation and weekly Taxol/carboplatin chemotherapy, 05/03/2009. The last treatment with Taxol/carboplatin was given 06/14/2009. He completed radiation on 06/16/2009.  3. Solid/liquid dysphagia secondary to the proximal esophageal mass, resolved. He is status post an esophageal dilatation for management of a stricture, 08/24/2009, repeat esophageal dilatation 10/03/2011. 4. Borderline enlarged mediastinal and gastrohepatic lymph  nodes on staging CT scans. PET scan 04/20/2009 confirmed hypermetabolic activity associated with the right paratracheal lymph node. 5. Hypertension. 6. Status post placement of a feeding jejunostomy tube by Dr. Johney Maine. The jejunostomy tube has been removed. 7. Admission with neutropenia and anemia, March 2011.  8. Fever of unknown origin, April 2011, resolved. He was treated with prednisone beginning on 07/13/2009 for presumed radiation pneumonitis. He developed episodes of rigors on prednisone, and the prednisone was discontinued, 07/15/2009. A CT scan did not reveal evidence for an infectious process. 9. Zoster rash at the right back and anterior chest July 2013. 10. Questionable leukoplakia on exam 04/09/2013-he was evaluated by dental medicine   Disposition: Aaron [name] remains in clinical remission from esophagus cancer. He will return for a follow-up visit in one year. He will contact the office in the interim with any problems.  Plan reviewed with Dr. Benay [name].  Aaron [name] ANP/GNP-BC   04/28/2015  9:28 AM

## 2015-05-18 ENCOUNTER — Other Ambulatory Visit: Payer: Self-pay | Admitting: Internal Medicine

## 2015-05-19 DIAGNOSIS — H35031 Hypertensive retinopathy, right eye: Secondary | ICD-10-CM | POA: Diagnosis not present

## 2015-05-19 DIAGNOSIS — H25013 Cortical age-related cataract, bilateral: Secondary | ICD-10-CM | POA: Diagnosis not present

## 2015-05-19 DIAGNOSIS — H40023 Open angle with borderline findings, high risk, bilateral: Secondary | ICD-10-CM | POA: Diagnosis not present

## 2015-05-19 DIAGNOSIS — H01003 Unspecified blepharitis right eye, unspecified eyelid: Secondary | ICD-10-CM | POA: Diagnosis not present

## 2015-05-19 DIAGNOSIS — H35372 Puckering of macula, left eye: Secondary | ICD-10-CM | POA: Diagnosis not present

## 2015-05-19 DIAGNOSIS — H35032 Hypertensive retinopathy, left eye: Secondary | ICD-10-CM | POA: Diagnosis not present

## 2015-07-27 ENCOUNTER — Other Ambulatory Visit: Payer: Self-pay | Admitting: Internal Medicine

## 2015-08-11 ENCOUNTER — Encounter: Payer: Self-pay | Admitting: Cardiovascular Disease

## 2015-08-11 ENCOUNTER — Ambulatory Visit (INDEPENDENT_AMBULATORY_CARE_PROVIDER_SITE_OTHER): Payer: Medicare Other | Admitting: Cardiovascular Disease

## 2015-08-11 VITALS — BP 100/67 | HR 75 | Ht 75.0 in | Wt 203.6 lb

## 2015-08-11 DIAGNOSIS — E039 Hypothyroidism, unspecified: Secondary | ICD-10-CM | POA: Diagnosis not present

## 2015-08-11 DIAGNOSIS — Z79899 Other long term (current) drug therapy: Secondary | ICD-10-CM

## 2015-08-11 DIAGNOSIS — I10 Essential (primary) hypertension: Secondary | ICD-10-CM

## 2015-08-11 DIAGNOSIS — E785 Hyperlipidemia, unspecified: Secondary | ICD-10-CM | POA: Diagnosis not present

## 2015-08-11 DIAGNOSIS — I2510 Atherosclerotic heart disease of native coronary artery without angina pectoris: Secondary | ICD-10-CM | POA: Diagnosis not present

## 2015-08-11 DIAGNOSIS — R011 Cardiac murmur, unspecified: Secondary | ICD-10-CM

## 2015-08-11 NOTE — Patient Instructions (Signed)
Your physician recommends that you return for lab work.  Your physician wants you to follow-up in: 1 year or sooner if needed. You will receive a reminder letter in the mail two months in advance. If you don't receive a letter, please call our office to schedule the follow-up appointment.  If you need a refill on your cardiac medications before your next appointment, please call your pharmacy.

## 2015-08-13 ENCOUNTER — Encounter: Payer: Self-pay | Admitting: Cardiovascular Disease

## 2015-08-13 NOTE — Progress Notes (Signed)
Patient ID: Kamin Niblack, male   DOB: 1936-01-31, 80 y.o.   MRN: [id number]     HPI: Gaurav Baldree is a 80 y.o. male presents to the office today for an 8 month cardiology evaluation.  Mr. Sidor has a history of mild CAD with 30% ostial LAD stenosis and 20% OM1 narrowing noted at cardiac catheterization in June 2010. Additional problems include hypertension with mild diastolic dysfunction, hyperlipidemia, and history of esophageal cancer, status post chemotherapy radiation therapy, history of hypothyroidism for which he is on thyroid replacement therapy. Over the past year, Mr. Toft has continued to do well. 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  He denies any change in exercise tolerance.  He denies chest pressure.  He denies palpitations.  He denies presyncope or syncope.  He denies edema.  He does have a history of diverticulosis and mild COPD.  He tells me he recently saw [name] who felt that he was stable with reference to his remote esophageal CA and that there was no need to do any repeat endoscopy.  Since I last saw him, he denies chest pain or shortness of breath.  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  At times he notes occasional numbness in his feet and toes.  He is unaware of palpitations.  He denies PND, orthopnea.  He underwent a 2-D echo Doppler study in October 2016 which showed an EF of 55-60%.  Mild to moderate aortic insufficiency, mild TR and trivial PR.  He pesents for reevaluation.  Past Medical History  Diagnosis Date  . Diverticulosis of colon   . Gastritis   . Hemorrhoids   . GERD (gastroesophageal reflux disease)   . Hiatal hernia   . Esophageal stricture   . Hyperlipidemia     [name]  . HTN (hypertension)     mild diastolic dysfunction   . Elevated glucose   . COPD (chronic obstructive pulmonary disease) ([place])   . 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 History of radiation therapy 05/03/09-06/16/09    squamous cell ca of esophagus  . Hypothyroid     s/p radiotherapy  . Arrhythmia   . Allergic rhinitis   . History of chemotherapy 05/03/09-06/16/09    Taxol/carboplatin  . CAD (coronary artery disease)     mild  . History of nuclear stress test 07/2008    exercise; mild-mod perfusion dfect in basal inf/mid inf/apical inf regions; EKG negative for ischemia    Past Surgical History  Procedure Laterality Date  . Lower back cyst removed      [facility]  . Creation of cutaneous stoma  2011  . Tonsillectomy      age 80  . Esophagogastroduodenoscopy  10/03/2011    Procedure: ESOPHAGOGASTRODUODENOSCOPY (EGD);  Surgeon: [name], MD,FACG;  Location: [name];  Service: Endoscopy;  Laterality: N/A;  no fluro needed  . [name] dilation  10/03/2011    Procedure: [name] DILATION;  Surgeon: [name], MD,FACG;  Location: [facility];  Service: Endoscopy;  Laterality: N/A;  . Cardiac catheterization  08/2008    mild nonobstructive CAD with 30% LAD stenosis prox & 20% OM1 narrowing   . Transthoracic echocardiogram  06/2009    EF=>55%; trace MR/TR    Allergies  Allergen Reactions  . Amoxicillin  REACTION: rash  . Cefuroxime Axetil     REACTION: nit feeling well  . Fluconazole     REACTION: shaking    Current Outpatient Prescriptions  Medication Sig Dispense Refill  . aspirin 81 MG EC tablet Take 81 mg by mouth daily.      . calcium carbonate (TUMS - DOSED IN MG ELEMENTAL CALCIUM) 500 MG chewable tablet Chew 2 tablets by mouth 2 (two) times daily as needed. Acid reflux/gas    . Cholecalciferol 1000 UNITS tablet Take 1,000 Units by mouth daily.      . clonazePAM (KLONOPIN) 1 MG tablet TAKE 1/2- 1 TABLETS BY MOUTH TWICE DAILY AS NEEDED FOR ANXIETY 60 tablet 3  . clotrimazole-betamethasone (LOTRISONE) cream Apply topically 2 (two) times daily. 15 g 2  .  fish oil-omega-3 fatty acids 1000 MG capsule Take 1 g by mouth daily.    Marland Kitchen guaiFENesin (MUCINEX) 600 MG 12 hr tablet Take 1,200 mg by mouth as needed for congestion.    . Ipratropium-Albuterol (COMBIVENT IN) Inhale into the lungs as needed.    Marland Kitchen levothyroxine (SYNTHROID, LEVOTHROID) 112 MCG tablet TAKE 1 TABLET BY MOUTH DAILY 90 tablet 3  . loratadine (CLARITIN) 10 MG tablet Take 10 mg by mouth daily.     . Mometasone Furo-Formoterol Fum (DULERA IN) Inhale into the lungs as needed.    Marland Kitchen omeprazole (PRILOSEC) 40 MG capsule TAKE 1 CAPSULE BY MOUTH DAILY 90 capsule 3  . simvastatin (ZOCOR) 20 MG tablet TAKE 1 TABLET BY MOUTH DAILY 90 tablet 1   No current facility-administered medications for this visit.    Social History   Social History  . Marital Status: Married    Spouse Name: N/A  . Number of Children: 3  . Years of Education: 12th   Occupational History  . 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 Smoking status: Former Smoker -- 1.00 packs/day for 40 years    Quit date: 01/16/2001  . Smokeless tobacco: Never Used     Comment: Quit 2002  . Alcohol Use: 0.0 oz/week    0 Standard drinks or equivalent per week     Comment: occasional drink 2-3 a month  . Drug Use: No     Comment: quit smoking 2002  . Sexual Activity: Yes   Other Topics Concern  . Not on file   Social History Narrative   Daily Caffeine - 1          Family History  Problem Relation Age of Onset  . Stroke Maternal Grandfather 35  . Prostate cancer Father     95's  . Diabetes Father   . Prostate cancer Brother 8  . Prostate cancer Brother 49  . Coronary artery disease Neg Hx   . Colon cancer Sister   . Hypertension Neg Hx   . Stroke Mother     aneurism    ROS General: Negative; No fevers, chills, or night sweats;  HEENT: Negative; No changes in vision or hearing, sinus congestion, difficulty swallowing Pulmonary: Negative; No cough, wheezing,  shortness of breath, hemoptysis Cardiovascular: Negative; No chest pain, presyncope, syncope, palpitations.  No claudication GI: Remote history of esophageal cancer, status post chemotherapy and radiation treatment GU: Negative; No dysuria, hematuria, or difficulty voiding Musculoskeletal: Negative; no myalgias, joint pain, or weakness Hematologic/Oncology: Negative; no easy bruising, bleeding Endocrine: Positive for hypothyroidism on Synthroid replacement Neuro: Negative; no changes in balance, headaches Skin: Negative; No rashes  or skin lesions Psychiatric: Negative; No behavioral problems, depression Sleep: Negative; No snoring, daytime sleepiness, hypersomnolence, bruxism, restless legs, hypnogognic hallucinations, no cataplexy Other comprehensive 14 point system review is negative.  PE BP 100/67 mmHg  Pulse 75  Ht 6' 3"  (1.905 m)  Wt 203 lb 9.6 oz (92.352 kg)  BMI 25.45 kg/m2   Wt Readings from Last 3 Encounters:  08/11/15 203 lb 9.6 oz (92.352 kg)  04/28/15 208 lb 4.8 oz (94.484 kg)  01/31/15 205 lb (92.987 kg)   Repeat blood pressure by me was 112/64 supine and 102/60 standing. General: Alert, oriented, no distress.  Skin: normal turgor, no rashes HEENT: Normocephalic, atraumatic. Pupils round and reactive; sclera anicteric;no lid lag.  Nose without nasal septal hypertrophy Mouth/Parynx benign; Mallinpatti scale 2 Neck: No JVD, no carotid bruits with normal carotid upstroke. Lungs: clear to ausculatation and percussion; no wheezing or rales Chest wall: Nontender to palpation Heart: RRR, s1 s2 normal; slightly progressive 5-6/3 /6 systolic murmur in the aortic area; faint diastolic murmur.  No S3 or S4 gallop.  No rubs, murmurs or heaves  Abdomen: Very mild diastases recti; soft, nontender; no hepatosplenomehaly, BS+; abdominal aorta nontender and not dilated by palpation. Back: No CVA tenderness  Pulses 2+ Extremities: no clubbing cyanosis or edema, Homan's sign negative    Neurologic: grossly nonfocal; cranial nerves grossly normal.   Psychological: Normal cognitive 100 normal affect and mood.  ECG (independently read by me): Normal sinus rhythm at 76 bpm.  PR interval 176 ms.  QTc interval 411 milliseconds.  No ST segment changes.  September 2016 ECG (independently read by me): Normal sinus rhythm at 87 bpm.  No ectopy.  Normal intervals.  No ST segment changes.  12/17/2013 ECG (independently read by me): Normal sinus rhythm at 65 beats per minute.  Normal intervals.  No ST segment changes.  Prior September 2014 ECG: Normal sinus rhythm at 60 beats per minute. Intervals are normal.  LABS:  BMP Latest Ref Rng 02/03/2015 08/05/2014 01/28/2014  Glucose 70 - 99 mg/dL 112(H) 109(H) 104(H)  BUN 6 - 23 mg/dL 17 17 16   Creatinine 0.40 - 1.50 mg/dL 1.52(H) 1.43 1.6(H)  Sodium 135 - 145 mEq/L 137 137 138  Potassium 3.5 - 5.1 mEq/L 4.1 4.1 3.7  Chloride 96 - 112 mEq/L 101 100 101  CO2 19 - 32 mEq/L 28 30 23   Calcium 8.4 - 10.5 mg/dL 10.2 10.2 9.8   Hepatic Function Latest Ref Rng 08/05/2014 01/28/2014 01/29/2013  Total Protein 6.0 - 8.3 g/dL 7.8 7.5 7.8  Albumin 3.5 - 5.2 g/dL 4.1 3.6 4.0  AST 0 - 37 U/L 24 26 25   ALT 0 - 53 U/L 13 16 11   Alk Phosphatase 39 - 117 U/L 82 65 66  Total Bilirubin 0.2 - 1.2 mg/dL 0.4 0.8 0.4  Bilirubin, Direct 0.0 - 0.3 mg/dL 0.1 0.1 0.1   CBC Latest Ref Rng 02/03/2015 08/05/2014 01/28/2014  WBC 4.0 - 10.5 K/uL 4.4 4.9 4.3  Hemoglobin 13.0 - 17.0 g/dL 14.0 14.0 13.7  Hematocrit 39.0 - 52.0 % 42.7 41.7 41.8  Platelets 150.0 - 400.0 K/uL 197.0 219.0 163.0   Lab Results  Component Value Date   MCV 88.0 02/03/2015   MCV 86.7 08/05/2014   MCV 87.9 01/28/2014   Lab Results  Component Value Date   TSH 2.01 02/03/2015   Lab Results  Component Value Date   HGBA1C 6.7* 08/05/2014   Lipid Panel     Component Value Date/Time  CHOL 164 08/05/2014 0731   TRIG 100.0 08/05/2014 0731   TRIG 120 02/26/2006 1420   HDL 57.20  08/05/2014 0731   CHOLHDL 3 08/05/2014 0731   CHOLHDL 3.3 CALC 02/26/2006 1420   VLDL 20.0 08/05/2014 0731   LDLCALC 87 08/05/2014 0731     ASSESSMENT AND PLAN: Mr. Gillson is a 80 year old African-American male who has documented mild CAD by cardiac catheterization in June 2010 for which he is been on medical therapy.  He continues to be stable without  any change in symptomatology with reference to exertional chest pressure or shortness of breath.  His ECG is stable and remains unchanged.  He has a history of hypertension, with mild diastolic dysfunction, as well as hyperlipidemia.  His blood pressure today is stable.  Laboratory from May 2016 revealed a total cholesterol was 164.  His serum creatinine had improved from 1.6 in November 2015 and was 1.43.  He was not anemic.  Hemoglobin A1c was 6.7. He has not had recent laboratory and I will check fasting lipids, comprehensive metabolic panel, CBC, hemoglobin A1c.  I reviewed his echo Doppler study with him in detail.  This continues to demonstrate normal systolic function.  There was mild to moderate AR and mild TR. 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  His blood pressure remained stable.  He is tolerating simvastatin without myalgias.  He is on levothyroxine for hypothyroidism.  I will contact him regarding the results of blood work.  Adjustments will be made if necessary to his regimen.  I will see him in one year for follow-up evaluation.  Time spent: 25 minutes  Troy Sine, MD, [facility]  08/13/2015 1:10 PM

## 2015-08-18 DIAGNOSIS — I10 Essential (primary) hypertension: Secondary | ICD-10-CM | POA: Diagnosis not present

## 2015-08-18 DIAGNOSIS — I2510 Atherosclerotic heart disease of native coronary artery without angina pectoris: Secondary | ICD-10-CM | POA: Diagnosis not present

## 2015-08-18 DIAGNOSIS — E039 Hypothyroidism, unspecified: Secondary | ICD-10-CM | POA: Diagnosis not present

## 2015-08-18 DIAGNOSIS — Z79899 Other long term (current) drug therapy: Secondary | ICD-10-CM | POA: Diagnosis not present

## 2015-08-18 DIAGNOSIS — E785 Hyperlipidemia, unspecified: Secondary | ICD-10-CM | POA: Diagnosis not present

## 2015-08-18 LAB — HEMOGLOBIN A1C
HEMOGLOBIN A1C: 6.7 % — AB (ref <5.7)
MEAN PLASMA GLUCOSE: 146 mg/dL

## 2015-08-18 LAB — CBC
HCT: 42.6 % (ref 38.5–50.0)
Hemoglobin: 13.8 g/dL (ref 13.2–17.1)
MCH: 28.9 pg (ref 27.0–33.0)
MCHC: 32.4 g/dL (ref 32.0–36.0)
MCV: 89.1 fL (ref 80.0–100.0)
MPV: 11 fL (ref 7.5–12.5)
PLATELETS: 217 10*3/uL (ref 140–400)
RBC: 4.78 MIL/uL (ref 4.20–5.80)
RDW: 13.9 % (ref 11.0–15.0)
WBC: 3.7 10*3/uL — ABNORMAL LOW (ref 3.8–10.8)

## 2015-08-18 LAB — COMPREHENSIVE METABOLIC PANEL
ALK PHOS: 67 U/L (ref 40–115)
ALT: 14 U/L (ref 9–46)
AST: 28 U/L (ref 10–35)
Albumin: 4.2 g/dL (ref 3.6–5.1)
BILIRUBIN TOTAL: 0.4 mg/dL (ref 0.2–1.2)
BUN: 16 mg/dL (ref 7–25)
CHLORIDE: 101 mmol/L (ref 98–110)
CO2: 27 mmol/L (ref 20–31)
Calcium: 9.8 mg/dL (ref 8.6–10.3)
Creat: 1.46 mg/dL — ABNORMAL HIGH (ref 0.70–1.18)
GLUCOSE: 114 mg/dL — AB (ref 65–99)
POTASSIUM: 4.6 mmol/L (ref 3.5–5.3)
SODIUM: 137 mmol/L (ref 135–146)
Total Protein: 7.4 g/dL (ref 6.1–8.1)

## 2015-08-18 LAB — LIPID PANEL
Cholesterol: 153 mg/dL (ref 125–200)
HDL: 54 mg/dL (ref >=40)
LDL CALC: 69 mg/dL (ref <130)
Total CHOL/HDL Ratio: 2.8 Ratio (ref <=5.0)
Triglycerides: 150 mg/dL — ABNORMAL HIGH (ref <150)
VLDL: 30 mg/dL (ref <30)

## 2015-08-18 LAB — TSH: TSH: 5.4 m[IU]/L — AB (ref 0.40–4.50)

## 2015-09-01 ENCOUNTER — Encounter: Payer: Self-pay | Admitting: *Deleted

## 2015-09-01 ENCOUNTER — Ambulatory Visit (INDEPENDENT_AMBULATORY_CARE_PROVIDER_SITE_OTHER): Payer: Medicare Other | Admitting: Internal Medicine

## 2015-09-01 ENCOUNTER — Encounter: Payer: Self-pay | Admitting: Internal Medicine

## 2015-09-01 VITALS — BP 104/64 | HR 69 | Wt 203.0 lb

## 2015-09-01 DIAGNOSIS — F411 Generalized anxiety disorder: Secondary | ICD-10-CM | POA: Diagnosis not present

## 2015-09-01 DIAGNOSIS — Z23 Encounter for immunization: Secondary | ICD-10-CM | POA: Diagnosis not present

## 2015-09-01 DIAGNOSIS — R739 Hyperglycemia, unspecified: Secondary | ICD-10-CM

## 2015-09-01 DIAGNOSIS — I2510 Atherosclerotic heart disease of native coronary artery without angina pectoris: Secondary | ICD-10-CM | POA: Diagnosis not present

## 2015-09-01 DIAGNOSIS — E034 Atrophy of thyroid (acquired): Secondary | ICD-10-CM

## 2015-09-01 DIAGNOSIS — E038 Other specified hypothyroidism: Secondary | ICD-10-CM | POA: Diagnosis not present

## 2015-09-01 DIAGNOSIS — I10 Essential (primary) hypertension: Secondary | ICD-10-CM

## 2015-09-01 MED ORDER — CLONAZEPAM 1 MG PO TABS
0.5000 mg | ORAL_TABLET | Freq: Two times a day (BID) | ORAL | Status: DC | PRN
Start: 2015-09-01 — End: 2015-12-29

## 2015-09-01 NOTE — Assessment & Plan Note (Signed)
On ASA, Simvastatin 

## 2015-09-01 NOTE — Progress Notes (Signed)
Subjective:  Patient ID: Aaron [name], male    DOB: 1935-07-22  Age: 80 y.o. MRN: [id number]  CC: No chief complaint on file.   HPI Aaron [name] presents for HTN, anxiety, dyslipidemia, elevated glucose f/u  Outpatient Prescriptions Prior to Visit  Medication Sig Dispense Refill  . aspirin 81 MG EC tablet Take 81 mg by mouth daily.      . calcium carbonate (TUMS - DOSED IN MG ELEMENTAL CALCIUM) 500 MG chewable tablet Chew 2 tablets by mouth 2 (two) times daily as needed. Acid reflux/gas    . Cholecalciferol 1000 UNITS tablet Take 1,000 Units by mouth daily.      . clonazePAM (KLONOPIN) 1 MG tablet TAKE 1/2- 1 TABLETS BY MOUTH TWICE DAILY AS NEEDED FOR ANXIETY 60 tablet 3  . clotrimazole-betamethasone (LOTRISONE) cream Apply topically 2 (two) times daily. 15 g 2  . fish oil-omega-3 fatty acids 1000 MG capsule Take 1 g by mouth daily.    Marland Kitchen guaiFENesin (MUCINEX) 600 MG 12 hr tablet Take 1,200 mg by mouth as needed for congestion.    Marland Kitchen levothyroxine (SYNTHROID, LEVOTHROID) 112 MCG tablet TAKE 1 TABLET BY MOUTH DAILY 90 tablet 3  . loratadine (CLARITIN) 10 MG tablet Take 10 mg by mouth daily.     Marland Kitchen omeprazole (PRILOSEC) 40 MG capsule TAKE 1 CAPSULE BY MOUTH DAILY 90 capsule 3  . simvastatin (ZOCOR) 20 MG tablet TAKE 1 TABLET BY MOUTH DAILY 90 tablet 1  . Ipratropium-Albuterol (COMBIVENT IN) Inhale into the lungs as needed. Reported on 09/01/2015    . Mometasone Furo-Formoterol Fum (DULERA IN) Inhale into the lungs as needed. Reported on 09/01/2015     No facility-administered medications prior to visit.    ROS Review of Systems  Constitutional: Negative for appetite change, fatigue and unexpected weight change.  HENT: Negative for congestion, nosebleeds, sneezing, sore throat and trouble swallowing.   Eyes: Negative for itching and visual disturbance.  Respiratory: Negative for cough.   Cardiovascular: Negative for chest pain, palpitations and leg swelling.  Gastrointestinal:  Negative for nausea, diarrhea, blood in stool and abdominal distention.  Genitourinary: Negative for frequency and hematuria.  Musculoskeletal: Negative for back pain, joint swelling, gait problem and neck pain.  Skin: Negative for rash.  Neurological: Negative for dizziness, tremors, speech difficulty and weakness.  Psychiatric/Behavioral: Negative for suicidal ideas, sleep disturbance, dysphoric mood and agitation. The patient is not nervous/anxious.     Objective:  BP 104/64 mmHg  Pulse 69  Wt 203 lb (92.08 kg)  SpO2 97%  BP Readings from Last 3 Encounters:  09/01/15 104/64  08/11/15 100/67  04/28/15 108/65    Wt Readings from Last 3 Encounters:  09/01/15 203 lb (92.08 kg)  08/11/15 203 lb 9.6 oz (92.352 kg)  04/28/15 208 lb 4.8 oz (94.484 kg)    Physical Exam  Constitutional: He is oriented to person, place, and time. He appears well-developed. No distress.  NAD  HENT:  Mouth/Throat: Oropharynx is clear and moist.  Eyes: Conjunctivae are normal. Pupils are equal, round, and reactive to light.  Neck: Normal range of motion. No JVD present. No thyromegaly present.  Cardiovascular: Normal rate, regular rhythm, normal heart sounds and intact distal pulses.  Exam reveals no gallop and no friction rub.   No murmur heard. Pulmonary/Chest: Effort normal and breath sounds normal. No respiratory distress. He has no wheezes. He has no rales. He exhibits no tenderness.  Abdominal: Soft. Bowel sounds are normal. He exhibits no distension and no mass.  There is no tenderness. There is no rebound and no guarding.  Musculoskeletal: Normal range of motion. He exhibits no edema or tenderness.  Lymphadenopathy:    He has no cervical adenopathy.  Neurological: He is alert and oriented to person, place, and time. He has normal reflexes. No cranial nerve deficit. He exhibits normal muscle tone. He displays a negative Romberg sign. Coordination and gait normal.  Skin: Skin is warm and dry. No  rash noted.  Psychiatric: He has a normal mood and affect. His behavior is normal. Judgment and thought content normal.    Lab Results  Component Value Date   WBC 3.7* 08/18/2015   HGB 13.8 08/18/2015   HCT 42.6 08/18/2015   PLT 217 08/18/2015   GLUCOSE 114* 08/18/2015   CHOL 153 08/18/2015   TRIG 150* 08/18/2015   HDL 54 08/18/2015   LDLCALC 69 08/18/2015   ALT 14 08/18/2015   AST 28 08/18/2015   NA 137 08/18/2015   K 4.6 08/18/2015   CL 101 08/18/2015   CREATININE 1.46* 08/18/2015   BUN 16 08/18/2015   CO2 27 08/18/2015   TSH 5.40* 08/18/2015   PSA 7.52* 08/05/2014   INR 1.19 04/28/2009   HGBA1C 6.7* 08/18/2015    No results found.  Assessment & Plan:   There are no diagnoses linked to this encounter. I am having Aaron [name] maintain his aspirin, loratadine, Cholecalciferol, calcium carbonate, Ipratropium-Albuterol (COMBIVENT IN), Mometasone Furo-Formoterol Fum (DULERA IN), guaiFENesin, fish oil-omega-3 fatty acids, clotrimazole-betamethasone, clonazePAM, levothyroxine, omeprazole, and simvastatin.  No orders of the defined types were placed in this encounter.     Follow-up: No Follow-up on file.  Walker Kehr, MD

## 2015-09-01 NOTE — Assessment & Plan Note (Signed)
Cut back on ice cream

## 2015-09-01 NOTE — Assessment & Plan Note (Signed)
Clonazepam prn low dose  Potential benefits of a long term benzodiazepines  use as well as potential risks  and complications were explained to the patient and were aknowledged.

## 2015-09-01 NOTE — Progress Notes (Signed)
Pre visit review using our clinic review tool, if applicable. No additional management support is needed unless otherwise documented below in the visit note. 

## 2015-09-01 NOTE — Assessment & Plan Note (Signed)
NAS diet 

## 2015-09-01 NOTE — Assessment & Plan Note (Signed)
TSH is up. Will re-check in 3 mo. On Levothroid

## 2015-09-22 DIAGNOSIS — H40023 Open angle with borderline findings, high risk, bilateral: Secondary | ICD-10-CM | POA: Diagnosis not present

## 2015-12-15 ENCOUNTER — Other Ambulatory Visit: Payer: Self-pay | Admitting: Internal Medicine

## 2015-12-29 ENCOUNTER — Ambulatory Visit (INDEPENDENT_AMBULATORY_CARE_PROVIDER_SITE_OTHER): Payer: Medicare Other | Admitting: Internal Medicine

## 2015-12-29 ENCOUNTER — Encounter: Payer: Self-pay | Admitting: Internal Medicine

## 2015-12-29 DIAGNOSIS — I2510 Atherosclerotic heart disease of native coronary artery without angina pectoris: Secondary | ICD-10-CM | POA: Diagnosis not present

## 2015-12-29 DIAGNOSIS — K2100 Gastro-esophageal reflux disease with esophagitis, without bleeding: Secondary | ICD-10-CM

## 2015-12-29 DIAGNOSIS — I10 Essential (primary) hypertension: Secondary | ICD-10-CM

## 2015-12-29 DIAGNOSIS — C155 Malignant neoplasm of lower third of esophagus: Secondary | ICD-10-CM | POA: Diagnosis not present

## 2015-12-29 DIAGNOSIS — D500 Iron deficiency anemia secondary to blood loss (chronic): Secondary | ICD-10-CM

## 2015-12-29 DIAGNOSIS — E034 Atrophy of thyroid (acquired): Secondary | ICD-10-CM | POA: Diagnosis not present

## 2015-12-29 DIAGNOSIS — Z23 Encounter for immunization: Secondary | ICD-10-CM | POA: Diagnosis not present

## 2015-12-29 MED ORDER — CLONAZEPAM 1 MG PO TABS: 0.5000 mg | ORAL_TABLET | Freq: Two times a day (BID) | ORAL | 3 refills | Status: DC | PRN

## 2015-12-29 NOTE — Assessment & Plan Note (Signed)
Labs

## 2015-12-29 NOTE — Progress Notes (Signed)
Subjective:  Patient ID: Aaron [name], male    DOB: 1935-08-20  Age: 80 y.o. MRN: [id number]  CC: No chief complaint on file.   HPI Aaron [name] presents for a hypothyroidism, cough, GERD and cancer f/u  Outpatient Medications Prior to Visit  Medication Sig Dispense Refill  . aspirin 81 MG EC tablet Take 81 mg by mouth daily.      . calcium carbonate (TUMS - DOSED IN MG ELEMENTAL CALCIUM) 500 MG chewable tablet Chew 2 tablets by mouth 2 (two) times daily as needed. Acid reflux/gas    . Cholecalciferol 1000 UNITS tablet Take 1,000 Units by mouth daily.      . clonazePAM (KLONOPIN) 1 MG tablet Take 0.5-1 tablets (0.5-1 mg total) by mouth 2 (two) times daily as needed for anxiety. 60 tablet 3  . clotrimazole-betamethasone (LOTRISONE) cream Apply topically 2 (two) times daily. 15 g 2  . fish oil-omega-3 fatty acids 1000 MG capsule Take 1 g by mouth daily.    Marland Kitchen guaiFENesin (MUCINEX) 600 MG 12 hr tablet Take 1,200 mg by mouth as needed for congestion.    . Ipratropium-Albuterol (COMBIVENT IN) Inhale into the lungs as needed. Reported on 09/01/2015    . levothyroxine (SYNTHROID, LEVOTHROID) 112 MCG tablet TAKE 1 TABLET BY MOUTH DAILY 90 tablet 3  . loratadine (CLARITIN) 10 MG tablet Take 10 mg by mouth daily.     . Mometasone Furo-Formoterol Fum (DULERA IN) Inhale into the lungs as needed. Reported on 09/01/2015    . omeprazole (PRILOSEC) 40 MG capsule TAKE 1 CAPSULE BY MOUTH DAILY 90 capsule 3  . simvastatin (ZOCOR) 20 MG tablet TAKE 1 TABLET BY MOUTH DAILY 90 tablet 1   No facility-administered medications prior to visit.     ROS Review of Systems  Constitutional: Negative for appetite change, fatigue and unexpected weight change.  HENT: Negative for congestion, nosebleeds, sneezing, sore throat and trouble swallowing.   Eyes: Negative for itching and visual disturbance.  Respiratory: Negative for cough.   Cardiovascular: Negative for chest pain, palpitations and leg swelling.    Gastrointestinal: Negative for abdominal distention, blood in stool, diarrhea and nausea.  Genitourinary: Negative for frequency and hematuria.  Musculoskeletal: Negative for back pain, gait problem, joint swelling and neck pain.  Skin: Negative for rash.  Neurological: Negative for dizziness, tremors, speech difficulty and weakness.  Psychiatric/Behavioral: Negative for agitation, dysphoric mood and sleep disturbance. The patient is not nervous/anxious.     Objective:  BP 108/76   Pulse 71   Temp 98.6 F (37 C) (Oral)   Wt 205 lb (93 kg)   SpO2 93%   BMI 25.62 kg/m   BP Readings from Last 3 Encounters:  12/29/15 108/76  09/01/15 104/64  08/11/15 100/67    Wt Readings from Last 3 Encounters:  12/29/15 205 lb (93 kg)  09/01/15 203 lb (92.1 kg)  08/11/15 203 lb 9.6 oz (92.4 kg)    Physical Exam  Constitutional: He is oriented to person, place, and time. He appears well-developed. No distress.  NAD  HENT:  Mouth/Throat: Oropharynx is clear and moist.  Eyes: Conjunctivae are normal. Pupils are equal, round, and reactive to light.  Neck: Normal range of motion. No JVD present. No thyromegaly present.  Cardiovascular: Normal rate, regular rhythm, normal heart sounds and intact distal pulses.  Exam reveals no gallop and no friction rub.   No murmur heard. Pulmonary/Chest: Effort normal and breath sounds normal. No respiratory distress. He has no wheezes. He has no  rales. He exhibits no tenderness.  Abdominal: Soft. Bowel sounds are normal. He exhibits no distension and no mass. There is no tenderness. There is no rebound and no guarding.  Musculoskeletal: Normal range of motion. He exhibits no edema or tenderness.  Lymphadenopathy:    He has no cervical adenopathy.  Neurological: He is alert and oriented to person, place, and time. He has normal reflexes. No cranial nerve deficit. He exhibits normal muscle tone. He displays a negative Romberg sign. Coordination and gait normal.   Skin: Skin is warm and dry. No rash noted.  Psychiatric: He has a normal mood and affect. His behavior is normal. Judgment and thought content normal.    Lab Results  Component Value Date   WBC 3.7 (L) 08/18/2015   HGB 13.8 08/18/2015   HCT 42.6 08/18/2015   PLT 217 08/18/2015   GLUCOSE 114 (H) 08/18/2015   CHOL 153 08/18/2015   TRIG 150 (H) 08/18/2015   HDL 54 08/18/2015   LDLCALC 69 08/18/2015   ALT 14 08/18/2015   AST 28 08/18/2015   NA 137 08/18/2015   K 4.6 08/18/2015   CL 101 08/18/2015   CREATININE 1.46 (H) 08/18/2015   BUN 16 08/18/2015   CO2 27 08/18/2015   TSH 5.40 (H) 08/18/2015   PSA 7.52 (H) 08/05/2014   INR 1.19 04/28/2009   HGBA1C 6.7 (H) 08/18/2015    No results found.  Assessment & Plan:   There are no diagnoses linked to this encounter. I am having [name] maintain his aspirin, loratadine, Cholecalciferol, calcium carbonate, Ipratropium-Albuterol (COMBIVENT IN), Mometasone Furo-Formoterol Fum (DULERA IN), guaiFENesin, fish oil-omega-3 fatty acids, clotrimazole-betamethasone, levothyroxine, omeprazole, clonazePAM, and simvastatin.  No orders of the defined types were placed in this encounter.    Follow-up: No Follow-up on file.  Aaron Kehr, MD

## 2015-12-29 NOTE — Progress Notes (Signed)
Pre visit review using our clinic review tool, if applicable. No additional management support is needed unless otherwise documented below in the visit note. 

## 2015-12-29 NOTE — Assessment & Plan Note (Signed)
NAS diet 

## 2015-12-29 NOTE — Assessment & Plan Note (Signed)
On ASA, Simvastatin 

## 2015-12-29 NOTE — Assessment & Plan Note (Signed)
Lab d

## 2015-12-29 NOTE — Assessment & Plan Note (Signed)
Prilosec qd 

## 2015-12-29 NOTE — Assessment & Plan Note (Signed)
F/u w/Oncology yearly or prn

## 2015-12-29 NOTE — Addendum Note (Signed)
Addended by: Cresenciano Lick on: 12/29/2015 11:46 AM   Modules accepted: Orders

## 2016-01-05 ENCOUNTER — Other Ambulatory Visit (INDEPENDENT_AMBULATORY_CARE_PROVIDER_SITE_OTHER): Payer: Medicare Other

## 2016-01-05 ENCOUNTER — Ambulatory Visit: Payer: Self-pay | Admitting: Internal Medicine

## 2016-01-05 DIAGNOSIS — I2510 Atherosclerotic heart disease of native coronary artery without angina pectoris: Secondary | ICD-10-CM

## 2016-01-05 DIAGNOSIS — F411 Generalized anxiety disorder: Secondary | ICD-10-CM

## 2016-01-05 DIAGNOSIS — R739 Hyperglycemia, unspecified: Secondary | ICD-10-CM | POA: Diagnosis not present

## 2016-01-05 DIAGNOSIS — E034 Atrophy of thyroid (acquired): Secondary | ICD-10-CM | POA: Diagnosis not present

## 2016-01-05 DIAGNOSIS — I10 Essential (primary) hypertension: Secondary | ICD-10-CM

## 2016-01-05 LAB — BASIC METABOLIC PANEL
BUN: 16 mg/dL (ref 6–23)
CHLORIDE: 103 meq/L (ref 96–112)
CO2: 26 meq/L (ref 19–32)
Calcium: 9.8 mg/dL (ref 8.4–10.5)
Creatinine, Ser: 1.51 mg/dL — ABNORMAL HIGH (ref 0.40–1.50)
GFR: 57.76 mL/min — AB (ref >60.00)
GLUCOSE: 90 mg/dL (ref 70–99)
POTASSIUM: 3.9 meq/L (ref 3.5–5.1)
Sodium: 138 mEq/L (ref 135–145)

## 2016-01-05 LAB — HEMOGLOBIN A1C: Hgb A1c MFr Bld: 6.6 % — ABNORMAL HIGH (ref 4.6–6.5)

## 2016-01-05 LAB — TSH: TSH: 6.31 u[IU]/mL — ABNORMAL HIGH (ref 0.35–4.50)

## 2016-01-08 ENCOUNTER — Other Ambulatory Visit: Payer: Self-pay | Admitting: Internal Medicine

## 2016-01-08 MED ORDER — LEVOTHYROXINE SODIUM 125 MCG PO TABS: 125.0000 ug | ORAL_TABLET | Freq: Every day | ORAL | 3 refills | Status: DC

## 2016-02-23 ENCOUNTER — Other Ambulatory Visit: Payer: Self-pay | Admitting: Internal Medicine

## 2016-03-29 ENCOUNTER — Ambulatory Visit (INDEPENDENT_AMBULATORY_CARE_PROVIDER_SITE_OTHER): Payer: Medicare Other | Admitting: Internal Medicine

## 2016-03-29 ENCOUNTER — Encounter: Payer: Self-pay | Admitting: Internal Medicine

## 2016-03-29 VITALS — BP 110/68 | HR 95 | Temp 98.1°F | Wt 207.0 lb

## 2016-03-29 DIAGNOSIS — I10 Essential (primary) hypertension: Secondary | ICD-10-CM

## 2016-03-29 DIAGNOSIS — E034 Atrophy of thyroid (acquired): Secondary | ICD-10-CM

## 2016-03-29 DIAGNOSIS — C155 Malignant neoplasm of lower third of esophagus: Secondary | ICD-10-CM | POA: Diagnosis not present

## 2016-03-29 DIAGNOSIS — I2510 Atherosclerotic heart disease of native coronary artery without angina pectoris: Secondary | ICD-10-CM

## 2016-03-29 DIAGNOSIS — Z23 Encounter for immunization: Secondary | ICD-10-CM

## 2016-03-29 DIAGNOSIS — F411 Generalized anxiety disorder: Secondary | ICD-10-CM

## 2016-03-29 MED ORDER — ESCITALOPRAM OXALATE 5 MG PO TABS
5.0000 mg | ORAL_TABLET | Freq: Every day | ORAL | 5 refills | Status: DC
Start: 2016-03-29 — End: 2016-05-03

## 2016-03-29 NOTE — Progress Notes (Signed)
Pre visit review using our clinic review tool, if applicable. No additional management support is needed unless otherwise documented below in the visit note. 

## 2016-03-29 NOTE — Assessment & Plan Note (Signed)
Levothroid 

## 2016-03-29 NOTE — Progress Notes (Signed)
Subjective:  Patient ID: Aaron [name], male    DOB: 07-29-1935  Age: 81 y.o. MRN: [id number]  CC: Follow-up (3 month f/u)   HPI Aaron [name] presents for HTN, anxiety, dyslipidemia, hypothyroidism, CRI  f/u  Outpatient Medications Prior to Visit  Medication Sig Dispense Refill  . aspirin 81 MG EC tablet Take 81 mg by mouth daily.      . calcium carbonate (TUMS - DOSED IN MG ELEMENTAL CALCIUM) 500 MG chewable tablet Chew 2 tablets by mouth 2 (two) times daily as needed. Acid reflux/gas    . Cholecalciferol 1000 UNITS tablet Take 1,000 Units by mouth daily.      . clonazePAM (KLONOPIN) 1 MG tablet Take 0.5-1 tablets (0.5-1 mg total) by mouth 2 (two) times daily as needed for anxiety. 60 tablet 3  . clotrimazole-betamethasone (LOTRISONE) cream Apply topically 2 (two) times daily. 15 g 2  . fish oil-omega-3 fatty acids 1000 MG capsule Take 1 g by mouth daily.    Marland Kitchen guaiFENesin (MUCINEX) 600 MG 12 hr tablet Take 1,200 mg by mouth as needed for congestion.    . Ipratropium-Albuterol (COMBIVENT IN) Inhale into the lungs as needed. Reported on 09/01/2015    . levothyroxine (SYNTHROID, LEVOTHROID) 125 MCG tablet Take 1 tablet (125 mcg total) by mouth daily. 90 tablet 3  . loratadine (CLARITIN) 10 MG tablet Take 10 mg by mouth daily.     . Mometasone Furo-Formoterol Fum (DULERA IN) Inhale into the lungs as needed. Reported on 09/01/2015    . omeprazole (PRILOSEC) 40 MG capsule TAKE 1 CAPSULE BY MOUTH DAILY 90 capsule 3  . simvastatin (ZOCOR) 20 MG tablet TAKE 1 TABLET BY MOUTH DAILY 90 tablet 1  . omeprazole (PRILOSEC) 40 MG capsule TAKE 1 CAPSULE BY MOUTH DAILY 90 capsule 0   No facility-administered medications prior to visit.     ROS Review of Systems  Constitutional: Negative for appetite change, fatigue and unexpected weight change.  HENT: Negative for congestion, nosebleeds, sneezing, sore throat and trouble swallowing.   Eyes: Negative for itching and visual disturbance.    Respiratory: Negative for cough.   Cardiovascular: Negative for chest pain, palpitations and leg swelling.  Gastrointestinal: Negative for abdominal distention, blood in stool, diarrhea and nausea.  Genitourinary: Negative for frequency and hematuria.  Musculoskeletal: Negative for back pain, gait problem, joint swelling and neck pain.  Skin: Negative for rash.  Neurological: Negative for dizziness, tremors, speech difficulty and weakness.  Psychiatric/Behavioral: Negative for agitation, dysphoric mood and sleep disturbance. The patient is not nervous/anxious.     Objective:  BP 110/68 (BP Location: Left Arm)   Pulse 95   Temp 98.1 F (36.7 C) (Oral)   Wt 207 lb (93.9 kg)   SpO2 97%   BMI 25.87 kg/m   BP Readings from Last 3 Encounters:  03/29/16 110/68  12/29/15 108/76  09/01/15 104/64    Wt Readings from Last 3 Encounters:  03/29/16 207 lb (93.9 kg)  12/29/15 205 lb (93 kg)  09/01/15 203 lb (92.1 kg)    Physical Exam  Constitutional: He is oriented to person, place, and time. He appears well-developed. No distress.  NAD  HENT:  Mouth/Throat: Oropharynx is clear and moist.  Eyes: Conjunctivae are normal. Pupils are equal, round, and reactive to light.  Neck: Normal range of motion. No JVD present. No thyromegaly present.  Cardiovascular: Normal rate, regular rhythm, normal heart sounds and intact distal pulses.  Exam reveals no gallop and no friction rub.  No murmur heard. Pulmonary/Chest: Effort normal and breath sounds normal. No respiratory distress. He has no wheezes. He has no rales. He exhibits no tenderness.  Abdominal: Soft. Bowel sounds are normal. He exhibits no distension and no mass. There is no tenderness. There is no rebound and no guarding.  Musculoskeletal: Normal range of motion. He exhibits no edema or tenderness.  Lymphadenopathy:    He has no cervical adenopathy.  Neurological: He is alert and oriented to person, place, and time. He has normal  reflexes. No cranial nerve deficit. He exhibits normal muscle tone. He displays a negative Romberg sign. Coordination and gait normal.  Skin: Skin is warm and dry. No rash noted.  Psychiatric: He has a normal mood and affect. His behavior is normal. Judgment and thought content normal.    Lab Results  Component Value Date   WBC 3.7 (L) 08/18/2015   HGB 13.8 08/18/2015   HCT 42.6 08/18/2015   PLT 217 08/18/2015   GLUCOSE 90 01/05/2016   CHOL 153 08/18/2015   TRIG 150 (H) 08/18/2015   HDL 54 08/18/2015   LDLCALC 69 08/18/2015   ALT 14 08/18/2015   AST 28 08/18/2015   NA 138 01/05/2016   K 3.9 01/05/2016   CL 103 01/05/2016   CREATININE 1.51 (H) 01/05/2016   BUN 16 01/05/2016   CO2 26 01/05/2016   TSH 6.31 (H) 01/05/2016   PSA 7.52 (H) 08/05/2014   INR 1.19 04/28/2009   HGBA1C 6.6 (H) 01/05/2016    No results found.  Assessment & Plan:   There are no diagnoses linked to this encounter. I am having Mr. Hepler maintain his aspirin, loratadine, Cholecalciferol, calcium carbonate, Ipratropium-Albuterol (COMBIVENT IN), Mometasone Furo-Formoterol Fum (DULERA IN), guaiFENesin, fish oil-omega-3 fatty acids, clotrimazole-betamethasone, omeprazole, simvastatin, clonazePAM, and levothyroxine.  No orders of the defined types were placed in this encounter.    Follow-up: No Follow-up on file.  Walker Kehr, MD

## 2016-03-29 NOTE — Assessment & Plan Note (Signed)
ASA Symvastatin

## 2016-03-29 NOTE — Assessment & Plan Note (Signed)
NAS diet 

## 2016-03-29 NOTE — Assessment & Plan Note (Addendum)
Clonazepam - d/c   start Lexapro low dose

## 2016-03-29 NOTE — Assessment & Plan Note (Signed)
F/u w/Oncology No sx's

## 2016-03-29 NOTE — Addendum Note (Signed)
Addended by: Earnstine Regal on: 03/29/2016 10:50 AM   Modules accepted: Orders

## 2016-04-24 ENCOUNTER — Telehealth: Payer: Self-pay | Admitting: Internal Medicine

## 2016-04-24 NOTE — Telephone Encounter (Signed)
Lexapro 5 mg for anxiety, it has been giving pt problems, please advise pt with other options

## 2016-04-25 ENCOUNTER — Telehealth: Payer: Self-pay | Admitting: Internal Medicine

## 2016-04-25 ENCOUNTER — Ambulatory Visit (HOSPITAL_COMMUNITY)
Admission: EM | Admit: 2016-04-25 | Discharge: 2016-04-25 | Disposition: A | Discharge status: Home / Self Care | Payer: Medicare Other | Attending: Family Medicine | Admitting: Family Medicine

## 2016-04-25 ENCOUNTER — Encounter (HOSPITAL_COMMUNITY): Payer: Self-pay | Admitting: Emergency Medicine

## 2016-04-25 DIAGNOSIS — G479 Sleep disorder, unspecified: Secondary | ICD-10-CM | POA: Diagnosis not present

## 2016-04-25 DIAGNOSIS — T50905A Adverse effect of unspecified drugs, medicaments and biological substances, initial encounter: Secondary | ICD-10-CM

## 2016-04-25 NOTE — Telephone Encounter (Signed)
Hold Lexapro x 5 d.Then try 1/2 tab at bedtime. Thx

## 2016-04-25 NOTE — Telephone Encounter (Signed)
Called pt spoke w/wife husband is at work. She states she will have him to call bck she did not know what issues he is having from the Lexapro...Aaron [name]

## 2016-04-25 NOTE — Telephone Encounter (Signed)
[place] [facility]     Patient Name: Aaron [name] Initial Comment Caller states that his doctor changed a medication that he was taking for anxiety. He has been taking it for a week. He feels light headed, some confusion, balance is unstable, has difficulty getting to sleep and diarrhea   DOB: 1935/10/21      Nurse Assessment  Nurse: Richardson Landry, RN, Aldona Bar Date/Time (Eastern Time): 04/25/2016 4:14:14 PM  Confirm and document reason for call. If symptomatic, describe symptoms. ---Caller states he is having muscle twitches in his arm and chest, denies chest pain. Pt was taking Clonazepam, new medication is escitalopram. Caller states he stopped the clonazepam 1 night and began lexapro the next night. Caller states bp was 110/69 but now 104/43.  Does the patient have any new or worsening symptoms? ---Yes  Will a triage be completed? ---Yes  Related visit to physician within the last 2 weeks? ---Yes  Does the PT have any chronic conditions? (i.e. diabetes, asthma, etc.) ---Yes  List chronic conditions. ---anxiety, thyroid cancer,  Is this a behavioral health or substance abuse call? ---No    Guidelines     Guideline Title Affirmed Question Affirmed Notes   Dizziness - Lightheadedness [1] Dizziness caused by heat exposure, sudden standing, or poor fluid intake AND [2] no improvement after 2 hours of rest and fluids    Final Disposition User   See Physician within 4 Hours (or PCP triage) Richardson Landry, RN, Samantha   Comments   Neither phone number given have voicemail boxes setup   Pt takes aspirin 81, calcium 500mg , vit D, clotrimazole cream, inhaler prn when he had cancer, fish oil, levothryoxine   Referrals   [facility]   Disagree/Comply: Comply

## 2016-04-25 NOTE — ED Provider Notes (Signed)
CSN: [id number]     Arrival date & time 04/25/16  1738 History   None    Chief Complaint  Patient presents with  . Allergic Reaction   (Consider location/radiation/quality/duration/timing/severity/associated sxs/prior Treatment) Patient states he may be having an allergic reaction to his new medicine citalopram 5mg .  He states he is having difficulty sleeping and feels jittery.  He has been on this medicine for a week.  He shows me the insert for the drug and he has a few side effects circled jitters and sleeping difficulties which he states he is having.   The history is provided by the patient and the spouse.  Allergic Reaction  Severity:  Mild Duration:  1 day Prior allergic episodes:  No prior episodes Relieved by:  Nothing Worsened by:  Nothing Ineffective treatments:  None tried   Past Medical History:  Diagnosis Date  . Allergic rhinitis   . Arrhythmia   . CAD (coronary artery disease)    mild  . COPD (chronic obstructive pulmonary disease) ([place])   . Diverticulosis of colon   . Elevated glucose   . Esophageal cancer ([place]) 2011   squamous cell ca - had J-tube for some time, now removed   . Esophageal stricture   . Gastritis   . GERD (gastroesophageal reflux disease)   . Hemorrhoids   . Hiatal hernia   . History of chemotherapy 05/03/09-06/16/09   Taxol/carboplatin  . History of nuclear stress test 07/2008   exercise; mild-mod perfusion dfect in basal inf/mid inf/apical inf regions; EKG negative for ischemia  . History of radiation therapy 05/03/09-06/16/09   squamous cell ca of esophagus  . HTN (hypertension)    mild diastolic dysfunction   . Hyperlipidemia    [name]  . Hypothyroid    s/p radiotherapy   Past Surgical History:  Procedure Laterality Date  . CARDIAC CATHETERIZATION  08/2008   mild nonobstructive CAD with 30% LAD stenosis prox & 20% OM1 narrowing   . CREATION OF CUTANEOUS STOMA  2011  . ESOPHAGOGASTRODUODENOSCOPY  10/03/2011   Procedure:  ESOPHAGOGASTRODUODENOSCOPY (EGD);  Surgeon: [name], MD,FACG;  Location: [name];  Service: Endoscopy;  Laterality: N/A;  no fluro needed  . Lower Back Cyst Removed     [facility]  . [name] DILATION  10/03/2011   Procedure: [name] DILATION;  Surgeon: [name], MD,FACG;  Location: [facility];  Service: Endoscopy;  Laterality: N/A;  . TONSILLECTOMY     age 81  . TRANSTHORACIC ECHOCARDIOGRAM  06/2009   EF=>55%; trace MR/TR   Family History  Problem Relation Age of Onset  . Prostate cancer Father     28's  . Diabetes Father   . Stroke Mother     aneurism  . Stroke Maternal Grandfather 35  . Prostate cancer Brother 5  . Prostate cancer Brother 17  . Colon cancer Sister   . Coronary artery disease Neg Hx   . Hypertension Neg Hx    Social History  Substance Use Topics  . Smoking status: Former Smoker    Packs/day: 1.00    Years: 40.00    Quit date: 01/16/2001  . Smokeless tobacco: Never Used     Comment: Quit 2002  . Alcohol use 0.0 oz/week     Comment: occasional drink 2-3 a month    Review of Systems  Constitutional: Negative.   HENT: Negative.   Eyes: Negative.   Respiratory: Negative.   Cardiovascular: Negative.   Gastrointestinal: Negative.   Endocrine: Negative.  Genitourinary: Negative.   Musculoskeletal: Negative.   Allergic/Immunologic: Negative.   Neurological: Negative.     Allergies  Amoxicillin; Cefuroxime axetil; Clonazepam; and Fluconazole  Home Medications   Prior to Admission medications   Medication Sig Start Date End Date Taking? Authorizing Provider  aspirin 81 MG EC tablet Take 81 mg by mouth daily.      Historical Provider, MD  calcium carbonate (TUMS - DOSED IN MG ELEMENTAL CALCIUM) 500 MG chewable tablet Chew 2 tablets by mouth 2 (two) times daily as needed. Acid reflux/gas    Historical Provider, MD  Cholecalciferol 1000 UNITS tablet Take 1,000 Units by mouth daily.      Historical Provider, MD   clotrimazole-betamethasone (LOTRISONE) cream Apply topically 2 (two) times daily. 01/31/15   Aleksei Plotnikov V, MD  escitalopram (LEXAPRO) 5 MG tablet Take 1 tablet (5 mg total) by mouth daily. 03/29/16   Aleksei Plotnikov V, MD  fish oil-omega-3 fatty acids 1000 MG capsule Take 1 g by mouth daily.    Historical Provider, MD  guaiFENesin (MUCINEX) 600 MG 12 hr tablet Take 1,200 mg by mouth as needed for congestion.    Historical Provider, MD  Ipratropium-Albuterol (COMBIVENT IN) Inhale into the lungs as needed. Reported on 09/01/2015    Historical Provider, MD  levothyroxine (SYNTHROID, LEVOTHROID) 125 MCG tablet Take 1 tablet (125 mcg total) by mouth daily. 01/08/16   Aleksei Plotnikov V, MD  loratadine (CLARITIN) 10 MG tablet Take 10 mg by mouth daily.     Historical Provider, MD  Mometasone Furo-Formoterol Fum (DULERA IN) Inhale into the lungs as needed. Reported on 09/01/2015    Historical Provider, MD  omeprazole (PRILOSEC) 40 MG capsule TAKE 1 CAPSULE BY MOUTH DAILY 03/09/15   Aleksei Plotnikov V, MD  simvastatin (ZOCOR) 20 MG tablet TAKE 1 TABLET BY MOUTH DAILY 12/15/15   Cassandria Anger, MD   Meds Ordered and Administered this Visit  Medications - No data to display  BP 139/73 (BP Location: Right Arm)   Pulse 77   Temp 98.3 F (36.8 C) (Oral)   Resp 18   SpO2 100%  No data found.   Physical Exam  Constitutional: He appears well-developed and well-nourished.  HENT:  Head: Normocephalic and atraumatic.  Right Ear: External ear normal.  Left Ear: External ear normal.  Mouth/Throat: Oropharynx is clear and moist.  Eyes: Conjunctivae and EOM are normal. Pupils are equal, round, and reactive to light.  Neck: Normal range of motion. Neck supple.  Cardiovascular: Normal rate, regular rhythm and normal heart sounds.   Pulmonary/Chest: Effort normal and breath sounds normal.  Nursing note and vitals reviewed.   Urgent Care Course     Procedures (including critical care  time)  Labs Review Labs Reviewed - No data to display  Imaging Review No results found.   Visual Acuity Review  Right Eye Distance:   Left Eye Distance:   Bilateral Distance:    Right Eye Near:   Left Eye Near:    Bilateral Near:         MDM   1. Adverse effect of drug, initial encounter    Stop the citalopram and follow up with your PCP.    Lysbeth Penner, FNP 04/25/16 [phone]

## 2016-04-25 NOTE — ED Triage Notes (Addendum)
Patient concerned he is having a reaction to medication: diarrhea, frequent urination, dizziness, weakness, felt disoriented.  Also says tongue not feeling normal.  Denies swelling in tongue.  Started generic lexapro-started taking medicine one week ago

## 2016-04-26 ENCOUNTER — Ambulatory Visit: Payer: Self-pay | Admitting: Oncology

## 2016-04-26 ENCOUNTER — Telehealth: Payer: Self-pay | Admitting: *Deleted

## 2016-04-26 NOTE — Telephone Encounter (Signed)
Call placed to check on patient d/t no show today.  Spoke with patient's wife and she stated that pt forgot about appt.  Will send message to schedulers to reschedule pt.

## 2016-04-26 NOTE — Telephone Encounter (Signed)
Tried contacting pt on home number, unable to reach, no dial tone.

## 2016-04-26 NOTE — Telephone Encounter (Signed)
Pls sch OV w/any provider Thx

## 2016-04-27 ENCOUNTER — Telehealth: Payer: Self-pay | Admitting: Oncology

## 2016-04-27 NOTE — Telephone Encounter (Signed)
lvm to inform pt of r/s appt 2/8 at 1045 am with LT per LOS

## 2016-05-03 ENCOUNTER — Telehealth: Payer: Self-pay | Admitting: Nurse Practitioner

## 2016-05-03 ENCOUNTER — Ambulatory Visit (HOSPITAL_BASED_OUTPATIENT_CLINIC_OR_DEPARTMENT_OTHER): Payer: Medicare Other | Admitting: Nurse Practitioner

## 2016-05-03 VITALS — BP 110/81 | HR 76 | Temp 98.2°F | Resp 18 | Ht 75.0 in | Wt 204.6 lb

## 2016-05-03 DIAGNOSIS — C154 Malignant neoplasm of middle third of esophagus: Secondary | ICD-10-CM

## 2016-05-03 DIAGNOSIS — Z8501 Personal history of malignant neoplasm of esophagus: Secondary | ICD-10-CM

## 2016-05-03 NOTE — Telephone Encounter (Signed)
Appointments scheduled per 2/8 LOS. Patient given AVS report and calendars with future scheduled appointments.  °

## 2016-05-03 NOTE — Progress Notes (Signed)
  [facility] OFFICE PROGRESS NOTE   Diagnosis:  Esophagus cancer  INTERVAL HISTORY:   Aaron [name] returns as scheduled. He feels well. He is working 4 days a week. He estimates walking 2-3 miles each day while at work. No dysphagia. No nausea or vomiting. No constipation or diarrhea. He denies pain. He has a good appetite.  Objective:  Vital signs in last 24 hours:  Blood pressure 110/81, pulse 76, temperature 98.2 F (36.8 C), temperature source Oral, resp. rate 18, height 6\' 3"  (1.905 m), weight 204 lb 9.6 oz (92.8 kg), SpO2 99 %.    HEENT: Neck without mass. Lymphatics: No palpable cervical, supraclavicular, axillary or inguinal lymph nodes. Resp: Lungs clear bilaterally. Cardio: Regular rate and rhythm. GI: Abdomen soft and nontender. No organomegaly. No mass. Vascular: No leg edema.    Lab Results:  Lab Results  Component Value Date   WBC 3.7 (L) 08/18/2015   HGB 13.8 08/18/2015   HCT 42.6 08/18/2015   MCV 89.1 08/18/2015   PLT 217 08/18/2015   NEUTROABS 2.3 02/03/2015    Imaging:  No results found.  Medications: I have reviewed the patient's current medications.  Assessment/Plan: 1. Squamous cell carcinoma of the esophagus, status post an endoscopic biopsy, 04/07/2009, with the pathology confirming at least superficial invasion. 2. PET scan, 04/20/2009, confirmed hypermetabolic activity associated with the upper and thoracic esophagus mass with a single hypermetabolic right paratracheal lymph node. He began concurrent radiation and weekly Taxol/carboplatin chemotherapy, 05/03/2009. The last treatment with Taxol/carboplatin was given 06/14/2009. He completed radiation on 06/16/2009.  3. Solid/liquid dysphagia secondary to the proximal esophageal mass, resolved. He is status post an esophageal dilatation for management of a stricture, 08/24/2009, repeat esophageal dilatation 10/03/2011. 4. Borderline enlarged mediastinal and gastrohepatic lymph  nodes on staging CT scans. PET scan 04/20/2009 confirmed hypermetabolic activity associated with the right paratracheal lymph node. 5. Hypertension. 6. Status post placement of a feeding jejunostomy tube by Dr. Johney Maine. The jejunostomy tube has been removed. 7. Admission with neutropenia and anemia, March 2011.  8. Fever of unknown origin, April 2011, resolved. He was treated with prednisone beginning on 07/13/2009 for presumed radiation pneumonitis. He developed episodes of rigors on prednisone, and the prednisone was discontinued, 07/15/2009. A CT scan did not reveal evidence for an infectious process. 9. Zoster rash at the right back and anterior chest July 2013. 10. Questionable leukoplakia on exam 04/09/2013-he was evaluated by dental medicine   Disposition: Aaron [name] remains in clinical remission from esophagus cancer. He will return for a follow-up visit in one year.  Patient seen with Dr. Benay Spice.    Ned Card ANP/GNP-BC   05/03/2016  11:56 AM  This was a shared visit with Ned Card. Aaron [name] remains in remission from esophagus cancer. He would like to continue follow-up at the Cancer center.  Julieanne Manson, M.D.

## 2016-05-19 ENCOUNTER — Other Ambulatory Visit: Payer: Self-pay | Admitting: Internal Medicine

## 2016-05-24 DIAGNOSIS — H2513 Age-related nuclear cataract, bilateral: Secondary | ICD-10-CM | POA: Diagnosis not present

## 2016-05-24 DIAGNOSIS — H40023 Open angle with borderline findings, high risk, bilateral: Secondary | ICD-10-CM | POA: Diagnosis not present

## 2016-05-24 DIAGNOSIS — H35372 Puckering of macula, left eye: Secondary | ICD-10-CM | POA: Diagnosis not present

## 2016-05-24 DIAGNOSIS — H25013 Cortical age-related cataract, bilateral: Secondary | ICD-10-CM | POA: Diagnosis not present

## 2016-05-24 LAB — HM DIABETES EYE EXAM

## 2016-05-29 ENCOUNTER — Encounter: Payer: Self-pay | Admitting: Internal Medicine

## 2016-05-29 NOTE — Progress Notes (Unsigned)
Results entered and sent to scan  

## 2016-06-04 ENCOUNTER — Ambulatory Visit (INDEPENDENT_AMBULATORY_CARE_PROVIDER_SITE_OTHER): Payer: Medicare Other | Admitting: Internal Medicine

## 2016-06-04 ENCOUNTER — Encounter: Payer: Self-pay | Admitting: Internal Medicine

## 2016-06-04 ENCOUNTER — Other Ambulatory Visit (INDEPENDENT_AMBULATORY_CARE_PROVIDER_SITE_OTHER): Payer: Medicare Other

## 2016-06-04 DIAGNOSIS — E034 Atrophy of thyroid (acquired): Secondary | ICD-10-CM

## 2016-06-04 DIAGNOSIS — F411 Generalized anxiety disorder: Secondary | ICD-10-CM | POA: Diagnosis not present

## 2016-06-04 DIAGNOSIS — R7309 Other abnormal glucose: Secondary | ICD-10-CM | POA: Diagnosis not present

## 2016-06-04 DIAGNOSIS — E785 Hyperlipidemia, unspecified: Secondary | ICD-10-CM

## 2016-06-04 DIAGNOSIS — E039 Hypothyroidism, unspecified: Secondary | ICD-10-CM | POA: Diagnosis not present

## 2016-06-04 DIAGNOSIS — I10 Essential (primary) hypertension: Secondary | ICD-10-CM

## 2016-06-04 DIAGNOSIS — I2510 Atherosclerotic heart disease of native coronary artery without angina pectoris: Secondary | ICD-10-CM | POA: Diagnosis not present

## 2016-06-04 LAB — BASIC METABOLIC PANEL
BUN: 12 mg/dL (ref 6–23)
CHLORIDE: 103 meq/L (ref 96–112)
CO2: 28 meq/L (ref 19–32)
Calcium: 9.9 mg/dL (ref 8.4–10.5)
Creatinine, Ser: 1.44 mg/dL (ref 0.40–1.50)
GFR: 60.95 mL/min (ref >60.00)
GLUCOSE: 100 mg/dL — AB (ref 70–99)
POTASSIUM: 4.2 meq/L (ref 3.5–5.1)
SODIUM: 137 meq/L (ref 135–145)

## 2016-06-04 LAB — HEMOGLOBIN A1C: Hgb A1c MFr Bld: 6.9 % — ABNORMAL HIGH (ref 4.6–6.5)

## 2016-06-04 MED ORDER — CLONAZEPAM 1 MG PO TABS
1.0000 mg | ORAL_TABLET | Freq: Every evening | ORAL | 2 refills | Status: DC | PRN
Start: 2016-06-04 — End: 2016-07-01

## 2016-06-04 NOTE — Progress Notes (Signed)
Subjective:  Patient ID: Aaron [name], male    DOB: [birth year]  Age: 81 y.o. MRN: [id number]  CC: Dizziness (leg weakness,fatigue, 1 week ago, sneezing, running nose, headache, anxiety)   HPI Shahrukh Pasch presents for allergies, hypothyroidism, dyslipidemia. C/o B calves weakness x 1-2 d  Outpatient Medications Prior to Visit  Medication Sig Dispense Refill  . aspirin 81 MG EC tablet Take 81 mg by mouth daily.      . Cholecalciferol 1000 UNITS tablet Take 1,000 Units by mouth daily.      . clonazePAM (KLONOPIN) 1 MG tablet   2  . clotrimazole-betamethasone (LOTRISONE) cream Apply topically 2 (two) times daily. 15 g 2  . fish oil-omega-3 fatty acids 1000 MG capsule Take 1 g by mouth daily.    Marland Kitchen guaiFENesin (MUCINEX) 600 MG 12 hr tablet Take 1,200 mg by mouth as needed for congestion.    . Ipratropium-Albuterol (COMBIVENT IN) Inhale into the lungs as needed. Reported on 09/01/2015    . levothyroxine (SYNTHROID, LEVOTHROID) 125 MCG tablet Take 1 tablet (125 mcg total) by mouth daily. 90 tablet 3  . loratadine (CLARITIN) 10 MG tablet Take 10 mg by mouth daily.     . Mometasone Furo-Formoterol Fum (DULERA IN) Inhale into the lungs as needed. Reported on 09/01/2015    . omeprazole (PRILOSEC) 40 MG capsule TAKE 1 CAPSULE BY MOUTH DAILY 90 capsule 2  . simvastatin (ZOCOR) 20 MG tablet TAKE 1 TABLET BY MOUTH DAILY 90 tablet 1  . omeprazole (PRILOSEC) 40 MG capsule TAKE 1 CAPSULE BY MOUTH DAILY 90 capsule 3   No facility-administered medications prior to visit.     ROS Review of Systems  Constitutional: Positive for fatigue. Negative for appetite change and unexpected weight change.  HENT: Negative for congestion, nosebleeds, sneezing, sore throat and trouble swallowing.   Eyes: Negative for itching and visual disturbance.  Respiratory: Negative for cough.   Cardiovascular: Negative for chest pain, palpitations and leg swelling.  Gastrointestinal: Negative for abdominal distention,  blood in stool, diarrhea and nausea.  Genitourinary: Negative for frequency and hematuria.  Musculoskeletal: Positive for arthralgias and gait problem. Negative for back pain, joint swelling and neck pain.  Skin: Negative for rash.  Neurological: Negative for dizziness, tremors, speech difficulty and weakness.  Psychiatric/Behavioral: Negative for agitation, dysphoric mood and sleep disturbance. The patient is nervous/anxious.     Objective:  BP (!) 106/46   Pulse 65   Temp 98.1 F (36.7 C) (Oral)   Resp 16   Ht 6\' 3"  (1.905 m)   Wt 206 lb 8 oz (93.7 kg)   SpO2 97%   BMI 25.81 kg/m   BP Readings from Last 3 Encounters:  06/04/16 (!) 106/46  05/03/16 110/81  04/25/16 139/73    Wt Readings from Last 3 Encounters:  06/04/16 206 lb 8 oz (93.7 kg)  05/03/16 204 lb 9.6 oz (92.8 kg)  03/29/16 207 lb (93.9 kg)    Physical Exam  Constitutional: He is oriented to person, place, and time. He appears well-developed. No distress.  NAD  HENT:  Mouth/Throat: Oropharynx is clear and moist.  Eyes: Conjunctivae are normal. Pupils are equal, round, and reactive to light.  Neck: Normal range of motion. No JVD present. No thyromegaly present.  Cardiovascular: Normal rate, regular rhythm, normal heart sounds and intact distal pulses.  Exam reveals no gallop and no friction rub.   No murmur heard. Pulmonary/Chest: Effort normal and breath sounds normal. No respiratory distress. He has no wheezes.  He has no rales. He exhibits no tenderness.  Abdominal: Soft. Bowel sounds are normal. He exhibits no distension and no mass. There is no tenderness. There is no rebound and no guarding.  Musculoskeletal: Normal range of motion. He exhibits no edema or tenderness.  Lymphadenopathy:    He has no cervical adenopathy.  Neurological: He is alert and oriented to person, place, and time. He has normal reflexes. No cranial nerve deficit. He exhibits normal muscle tone. He displays a negative Romberg sign.  Coordination and gait normal.  Skin: Skin is warm and dry. No rash noted.  Psychiatric: He has a normal mood and affect. His behavior is normal. Judgment and thought content normal.    Lab Results  Component Value Date   WBC 3.7 (L) 08/18/2015   HGB 13.8 08/18/2015   HCT 42.6 08/18/2015   PLT 217 08/18/2015   GLUCOSE 90 01/05/2016   CHOL 153 08/18/2015   TRIG 150 (H) 08/18/2015   HDL 54 08/18/2015   LDLCALC 69 08/18/2015   ALT 14 08/18/2015   AST 28 08/18/2015   NA 138 01/05/2016   K 3.9 01/05/2016   CL 103 01/05/2016   CREATININE 1.51 (H) 01/05/2016   BUN 16 01/05/2016   CO2 26 01/05/2016   TSH 6.31 (H) 01/05/2016   PSA 7.52 (H) 08/05/2014   INR 1.19 04/28/2009   HGBA1C 6.6 (H) 01/05/2016    No results found.  Assessment & Plan:   There are no diagnoses linked to this encounter. I am having Mr. Mellin maintain his aspirin, loratadine, Cholecalciferol, Ipratropium-Albuterol (COMBIVENT IN), Mometasone Furo-Formoterol Fum (DULERA IN), guaiFENesin, fish oil-omega-3 fatty acids, clotrimazole-betamethasone, simvastatin, levothyroxine, clonazePAM, and omeprazole.  No orders of the defined types were placed in this encounter.    Follow-up: No Follow-up on file.  Walker Kehr, MD

## 2016-06-04 NOTE — Assessment & Plan Note (Signed)
Hold simvastatin x1-2 wks if weak legs

## 2016-06-04 NOTE — Progress Notes (Signed)
Pre-visit discussion using our clinic review tool. No additional management support is needed unless otherwise documented below in the visit note.  

## 2016-06-04 NOTE — Patient Instructions (Signed)
Hold Simvastatin x1-2 weeks if weak muscles

## 2016-06-04 NOTE — Assessment & Plan Note (Signed)
Labs On Levothroid 

## 2016-06-05 LAB — TSH: TSH: 1.55 u[IU]/mL (ref 0.35–4.50)

## 2016-06-06 ENCOUNTER — Telehealth: Payer: Self-pay | Admitting: Internal Medicine

## 2016-06-06 NOTE — Telephone Encounter (Signed)
Patient called back.  Gave MD response on recent labs.

## 2016-07-01 ENCOUNTER — Other Ambulatory Visit: Payer: Self-pay | Admitting: Internal Medicine

## 2016-07-03 NOTE — Telephone Encounter (Signed)
Called refill into pharmacy had to leave on pharmacy vm.../lmb 

## 2016-07-05 ENCOUNTER — Ambulatory Visit: Payer: Self-pay | Admitting: Internal Medicine

## 2016-08-06 ENCOUNTER — Telehealth: Payer: Self-pay | Admitting: Internal Medicine

## 2016-08-06 DIAGNOSIS — M21619 Bunion of unspecified foot: Secondary | ICD-10-CM

## 2016-08-06 NOTE — Telephone Encounter (Signed)
Okay to do?

## 2016-08-06 NOTE — Telephone Encounter (Signed)
Pt called in and said that he needs a referral to [facility] for A Bunion

## 2016-08-06 NOTE — Telephone Encounter (Signed)
OK. Thx

## 2016-08-07 NOTE — Telephone Encounter (Signed)
referral made.

## 2016-08-09 ENCOUNTER — Ambulatory Visit (INDEPENDENT_AMBULATORY_CARE_PROVIDER_SITE_OTHER): Payer: Medicare Other | Admitting: Podiatry

## 2016-08-09 ENCOUNTER — Encounter: Payer: Self-pay | Admitting: Podiatry

## 2016-08-09 ENCOUNTER — Ambulatory Visit (INDEPENDENT_AMBULATORY_CARE_PROVIDER_SITE_OTHER): Payer: Medicare Other

## 2016-08-09 VITALS — BP 106/67 | HR 72 | Resp 16 | Ht 75.0 in | Wt 205.0 lb

## 2016-08-09 DIAGNOSIS — L84 Corns and callosities: Secondary | ICD-10-CM | POA: Diagnosis not present

## 2016-08-09 DIAGNOSIS — I2510 Atherosclerotic heart disease of native coronary artery without angina pectoris: Secondary | ICD-10-CM

## 2016-08-09 DIAGNOSIS — M216X9 Other acquired deformities of unspecified foot: Secondary | ICD-10-CM

## 2016-08-09 DIAGNOSIS — M779 Enthesopathy, unspecified: Secondary | ICD-10-CM | POA: Diagnosis not present

## 2016-08-09 MED ORDER — TRIAMCINOLONE ACETONIDE 10 MG/ML IJ SUSP
10.0000 mg | Freq: Once | INTRAMUSCULAR | Status: AC
  Administered 2016-08-09: 10 mg

## 2016-08-09 NOTE — Progress Notes (Signed)
   Subjective:    Patient ID: Aaron [name], male    DOB: Mar 28, 1935, 81 y.o.   MRN: [id number]  HPI Chief Complaint  Patient presents with  . Painful lesion    Left foot; plantar forefoot-below 5th toe; pt stated, "Has had for a long time, usually trims it down"      Review of Systems  Musculoskeletal: Positive for gait problem.  All other systems reviewed and are negative.      Objective:   Physical Exam        Assessment & Plan:

## 2016-08-13 NOTE — Progress Notes (Signed)
Subjective:    Patient ID: Aaron [name], male   DOB: 81 y.o.   MRN: [id number]   HPI patient states he's had a lot of problems with the bottom of his left foot and is had a lesion was unable to take care of himself but no longer is able to do this    Review of Systems  All other systems reviewed and are negative.       Objective:  Physical Exam  Cardiovascular: Intact distal pulses.   Musculoskeletal: Normal range of motion.  Neurological: He is alert.  Skin: Skin is warm.  Nursing note and vitals reviewed.   Neurovascular status intact with patient found to have inflammation and pain around the fifth MPJ left with fluid buildup around the surface. Patient's noted to have good digital perfusion and is well oriented 3    Assessment:  Inflammatory capsulitis fifth MPJ left with pain when palpated and fluid buildup along with lesion formation       Plan:   H&P condition reviewed and careful plantar injection administered 3 mg dexamethasone Kenalog and after appropriate numbness debridement of lesion formation accomplished with discussion long-term of fifth metatarsal head resection which may be necessary depending on when symptoms were to reoccur

## 2016-08-17 ENCOUNTER — Other Ambulatory Visit: Payer: Self-pay | Admitting: Internal Medicine

## 2016-08-28 ENCOUNTER — Ambulatory Visit (INDEPENDENT_AMBULATORY_CARE_PROVIDER_SITE_OTHER): Payer: Medicare Other | Admitting: Cardiovascular Disease

## 2016-08-28 ENCOUNTER — Encounter: Payer: Self-pay | Admitting: Cardiovascular Disease

## 2016-08-28 VITALS — BP 104/70 | HR 79 | Ht 75.0 in | Wt 208.0 lb

## 2016-08-28 DIAGNOSIS — E039 Hypothyroidism, unspecified: Secondary | ICD-10-CM

## 2016-08-28 DIAGNOSIS — I2510 Atherosclerotic heart disease of native coronary artery without angina pectoris: Secondary | ICD-10-CM

## 2016-08-28 DIAGNOSIS — I10 Essential (primary) hypertension: Secondary | ICD-10-CM

## 2016-08-28 DIAGNOSIS — Z8501 Personal history of malignant neoplasm of esophagus: Secondary | ICD-10-CM

## 2016-08-28 DIAGNOSIS — E785 Hyperlipidemia, unspecified: Secondary | ICD-10-CM

## 2016-08-28 DIAGNOSIS — I358 Other nonrheumatic aortic valve disorders: Secondary | ICD-10-CM

## 2016-08-28 NOTE — Progress Notes (Signed)
Patient ID: Aaron [name], male   DOB: 09-05-1935, 81 y.o.   MRN: [id number]     HPI: Aaron [name] is a 81 y.o. male presents to the office today for an 109 month cardiology evaluation.  Aaron [name] has a history of mild CAD with 30% ostial LAD stenosis and 20% OM1 narrowing noted at cardiac catheterization in June 2010. Additional problems include hypertension with mild diastolic dysfunction, hyperlipidemia, and history of esophageal cancer, status post chemotherapy radiation therapy, history of hypothyroidism for which he is on thyroid replacement therapy. Over the past year, Aaron [name] has continued to do well. 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  He denies any change in exercise tolerance.  He denies chest pressure.  He denies palpitations.  He denies presyncope or syncope.  He denies edema.  He does have a history of diverticulosis and mild COPD.  He tells me he recently saw [name] who felt that he was stable with reference to his remote esophageal CA and that there was no need to do any repeat endoscopy.   He underwent a 2-D echo Doppler study in October 2016 which showed an EF of 55-60%.  Mild to moderate aortic insufficiency, mild TR and trivial PR.  Since I last saw him, he denies chest pain or shortness of breath.  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  He states he typically walks approximately for 6 hours at work, which equates to almost 3 miles.  At times he notes occasional numbness in his feet and toes.  He is unaware of palpitations.  He denies PND, orthopnea.  He denies palpitations He pesents for reevaluation.  Past Medical History:  Diagnosis Date  . Allergic rhinitis   . Arrhythmia   . CAD (coronary artery disease)    mild  . COPD (chronic obstructive pulmonary disease) ([place])   . Diverticulosis of colon   . Elevated glucose   . 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 Esophageal stricture   . Gastritis   . GERD (gastroesophageal reflux disease)   . Hemorrhoids   . Hiatal hernia   . History of chemotherapy 05/03/09-06/16/09   Taxol/carboplatin  . History of nuclear stress test 07/2008   exercise; mild-mod perfusion dfect in basal inf/mid inf/apical inf regions; EKG negative for ischemia  . History of radiation therapy 05/03/09-06/16/09   squamous cell ca of esophagus  . HTN (hypertension)    mild diastolic dysfunction   . Hyperlipidemia    [name]  . Hypothyroid    s/p radiotherapy    Past Surgical History:  Procedure Laterality Date  . CARDIAC CATHETERIZATION  08/2008   mild nonobstructive CAD with 30% LAD stenosis prox & 20% OM1 narrowing   . CREATION OF CUTANEOUS STOMA  2011  . ESOPHAGOGASTRODUODENOSCOPY  10/03/2011   Procedure: ESOPHAGOGASTRODUODENOSCOPY (EGD);  Surgeon: [name], MD,FACG;  Location: [name];  Service: Endoscopy;  Laterality: N/A;  no fluro needed  . Lower Back Cyst Removed     [facility]  . [name] DILATION  10/03/2011   Procedure: [name] DILATION;  Surgeon: [name], MD,FACG;  Location: [facility];  Service: Endoscopy;  Laterality: N/A;  . TONSILLECTOMY     age 81  . TRANSTHORACIC ECHOCARDIOGRAM  06/2009   EF=>55%; trace MR/TR    Allergies  Allergen Reactions  . Amoxicillin     REACTION: rash  . Cefuroxime Axetil     REACTION: nit feeling well  . Fluconazole     REACTION: shaking  . Escitalopram Other (See Comments)    Nightmares, numbness in tongue, dizziness    Current Outpatient Prescriptions  Medication Sig Dispense Refill  . aspirin 81 MG EC tablet Take 81 mg by mouth daily.      . CHOLECALCIFEROL PO Take 1,000 Units by mouth daily.    . clonazePAM (KLONOPIN) 1 MG tablet TAKE 1/2 TO 1 TABLET BY MOUTH TWICE DAILY AS NEEDED FOR ANXIETY 60 tablet 2  . clotrimazole-betamethasone (LOTRISONE) cream Apply topically 2 (two) times  daily. 15 g 2  . levothyroxine (SYNTHROID, LEVOTHROID) 125 MCG tablet Take 1 tablet (125 mcg total) by mouth daily. 90 tablet 3  . loratadine (CLARITIN) 10 MG tablet Take 10 mg by mouth daily.     Marland Kitchen omeprazole (PRILOSEC) 40 MG capsule TAKE 1 CAPSULE BY MOUTH DAILY 90 capsule 2  . simvastatin (ZOCOR) 20 MG tablet TAKE 1 TABLET BY MOUTH DAILY 90 tablet 2   No current facility-administered medications for this visit.     Social History   Social History  . Marital status: Married    Spouse name: N/A  . Number of children: 3  . Years of education: 12th   Occupational History  . Retired, [place] [employer]    Librarian, academic   Social History Main Topics  . Smoking status: Former Smoker    Packs/day: 1.00    Years: 40.00    Quit date: 01/16/2001  . Smokeless tobacco: Never Used     Comment: Quit 2002  . Alcohol use 0.0 oz/week     Comment: occasional drink 2-3 a month  . Drug use: No     Comment: quit smoking 2002  . Sexual activity: Yes   Other Topics Concern  . Not on file   Social History Narrative   Daily Caffeine - 1          Family History  Problem Relation Age of Onset  . Prostate cancer Father        62's  . Diabetes Father   . Stroke Mother        aneurism  . Stroke Maternal Grandfather 35  . Prostate cancer Brother 59  . Prostate cancer Brother 70  . Colon cancer Sister   . Coronary artery disease Neg Hx   . Hypertension Neg Hx     ROS General: Negative; No fevers, chills, or night sweats;  HEENT: Negative; No changes in vision or hearing, sinus congestion, difficulty swallowing Pulmonary: Negative; No cough, wheezing, shortness of breath, hemoptysis Cardiovascular: Negative; No chest pain, presyncope, syncope, palpitations.  No claudication GI: Remote history of esophageal cancer, status post chemotherapy and radiation treatment GU: Negative; No dysuria, hematuria, or difficulty voiding Musculoskeletal: Negative; no myalgias, joint pain,  or weakness Hematologic/Oncology: Negative; no easy bruising, bleeding Endocrine: Positive for hypothyroidism on Synthroid replacement Neuro: Negative; no changes in balance, headaches Skin: Negative; No rashes or skin lesions Psychiatric: Negative; No behavioral problems, depression Sleep: Negative; No snoring, daytime sleepiness, hypersomnolence, bruxism, restless legs, hypnogognic hallucinations, no cataplexy Other comprehensive 14 point system review is negative.  PE BP 104/70   Pulse 79   Ht 6' 3" (1.905 m)   Wt 208 lb (94.3 kg)   BMI 26.00 kg/m    Repeat blood pressure by me was 110/70 supine, 118/70 standing  Wt Readings from Last 3 Encounters:  08/28/16 208 lb (94.3 kg)  08/09/16 205 lb (93 kg)  06/04/16 206 lb 8 oz (93.7 kg)    General: Alert, oriented, no distress.  Skin: normal turgor, no rashes, warm and dry HEENT: Normocephalic, atraumatic. Pupils equal round and reactive to light; sclera anicteric; extraocular muscles intact;  Nose without nasal septal hypertrophy Mouth/Parynx benign; Mallinpatti scale 2 Neck: No JVD, no carotid bruits; normal carotid upstroke Lungs: clear to ausculatation and percussion; no wheezing or rales Chest wall: without tenderness to palpitation Heart: PMI not displaced, RRR, s1 s2 TDVVOH,6-0/7 systolic murmur, 1/6 diastolic murmur, no rubs, gallops, thrills, or heaves Abdomen: Mild diastases recti soft, nontender; no hepatosplenomehaly, BS+; abdominal aorta nontender and not dilated by palpation. Back: no CVA tenderness Pulses 2+ Musculoskeletal: full range of motion, normal strength, no joint deformities Extremities: no clubbing cyanosis or edema, Homan's sign negative  Neurologic: grossly nonfocal; Cranial nerves grossly wnl Psychologic: Normal mood and affect    ECG (independently read by me): Normal sinus rhythm at 79 bpm.  No ectopy.  No ST segment changes.  May 2017 ECG (independently read by me): Normal sinus rhythm at 76  bpm.  PR interval 176 ms.  QTc interval 411 milliseconds.  No ST segment changes.  September 2016 ECG (independently read by me): Normal sinus rhythm at 87 bpm.  No ectopy.  Normal intervals.  No ST segment changes.  12/17/2013 ECG (independently read by me): Normal sinus rhythm at 65 beats per minute.  Normal intervals.  No ST segment changes.  Prior September 2014 ECG: Normal sinus rhythm at 60 beats per minute. Intervals are normal.  LABS:  BMP Latest Ref Rng & Units 06/04/2016 01/05/2016 08/18/2015  Glucose 70 - 99 mg/dL 100(H) 90 114(H)  BUN 6 - 23 mg/dL _0 Creatinine 0.40 - 1.50 mg/dL 1.44 1.51(H) 1.46(H)  Sodium 135 - 145 mEq/L 137 138 137  Potassium 3.5 - 5.1 mEq/L 4.2 3.9 4.6  Chloride 96 - 112 mEq/L 103 103 101  CO2 19 - 32 mEq/L _1 Calcium 8.4 - 10.5 mg/dL 9.9 9.8 9.8   Hepatic Function Latest Ref Rng & Units 08/18/2015 08/05/2014 01/28/2014  Total Protein 6.1 - 8.1 g/dL 7.4 7.8 7.5  Albumin 3.6 - 5.1 g/dL 4.2 4.1 3.6  AST 10 - 35 U/L _2 ALT 9 - 46 U/L _3 Alk Phosphatase 40 - 115 U/L 67 82 65  Total Bilirubin 0.2 - 1.2 mg/dL 0.4 0.4 0.8  Bilirubin, Direct 0.0 - 0.3 mg/dL - 0.1 0.1   CBC Latest Ref Rng & Units 08/18/2015 02/03/2015 08/05/2014  WBC 3.8 - 10.8 K/uL 3.7(L) 4.4 4.9  Hemoglobin 13.2 - 17.1 g/dL 13.8 14.0 14.0  Hematocrit 38.5 - 50.0 % 42.6 42.7 41.7  Platelets 140 - 400 K/uL 217 197.0 219.0   Lab Results  Component Value Date   MCV 89.1 08/18/2015   MCV 88.0 02/03/2015   MCV 86.7 08/05/2014   Lab Results  Component Value Date   TSH 1.55 06/04/2016   Lab Results  Component Value Date   HGBA1C 6.9 (H) 06/04/2016   Lipid Panel     Component Value Date/Time   CHOL 153 08/18/2015 0803   TRIG 150 (H) 08/18/2015 0803   TRIG 120 02/26/2006 1420   HDL 54 08/18/2015 0803   CHOLHDL 2.8 08/18/2015 0803   VLDL 30 08/18/2015 0803   LDLCALC 69 08/18/2015 0803  IMPRESSION:  1. Essential hypertension   2. Coronary artery  disease involving native coronary artery of native heart without angina pectoris   3. Aortic heart murmur   4. Dyslipidemia   5. Hypothyroidism, unspecified type   6. History of esophageal cancer     ASSESSMENT AND PLAN: Aaron [name] is a 81 year old African-American male who has documented mild CAD by cardiac catheterization in June 2010 for which he is been on medical therapy.  He continues to be stable without  any change in symptomatology with reference to exertional chest pressure or shortness of breath.  His ECG is stable and remains unchanged.  He has a history of hypertension, with mild diastolic dysfunction, and on his last echo had mild to moderate AR and mild TR.  His blood pressure today is controlled.  He is not on any hypertensive medications.  He has a history of hypothyroidism.  In October 2017.  His TSH was elevated at 6.31 and his levothyroxine dose was increased by [name] and he continues to take levothyroxine 125 mg daily.  He has GERD which is controlled with omeprazole.  He has a history of hyperlipidemia and has been on simvastatin 20 mg.  When last checked, LDL was 69.  [name] will be rechecking laboratory.  He continues to be active and walks at least 3 miles during his 6 hours of work 4 days per week.  He is not having any anginal symptoms or palpitations.  He has not had any recurrence of his esophageal cancer.  As long as he remains stable, I will see him in one year for reevaluation.  Time spent: 25 minutes  Troy Sine, MD, [facility]  08/29/2016 7:20 PM

## 2016-08-28 NOTE — Patient Instructions (Signed)
Your physician wants you to follow-up in: 1 year or sooner if needed. You will receive a reminder letter in the mail two months in advance. If you don't receive a letter, please call our office to schedule the follow-up appointment.   If you need a refill on your cardiac medications before your next appointment, please call your pharmacy.   

## 2016-08-30 ENCOUNTER — Ambulatory Visit (INDEPENDENT_AMBULATORY_CARE_PROVIDER_SITE_OTHER): Payer: Medicare Other | Admitting: Internal Medicine

## 2016-08-30 ENCOUNTER — Other Ambulatory Visit (INDEPENDENT_AMBULATORY_CARE_PROVIDER_SITE_OTHER): Payer: Medicare Other

## 2016-08-30 ENCOUNTER — Encounter: Payer: Self-pay | Admitting: Internal Medicine

## 2016-08-30 ENCOUNTER — Ambulatory Visit: Payer: Medicare Other | Admitting: Podiatry

## 2016-08-30 DIAGNOSIS — R972 Elevated prostate specific antigen [PSA]: Secondary | ICD-10-CM

## 2016-08-30 DIAGNOSIS — F411 Generalized anxiety disorder: Secondary | ICD-10-CM | POA: Diagnosis not present

## 2016-08-30 DIAGNOSIS — C61 Malignant neoplasm of prostate: Secondary | ICD-10-CM | POA: Insufficient documentation

## 2016-08-30 DIAGNOSIS — I2510 Atherosclerotic heart disease of native coronary artery without angina pectoris: Secondary | ICD-10-CM

## 2016-08-30 DIAGNOSIS — E785 Hyperlipidemia, unspecified: Secondary | ICD-10-CM

## 2016-08-30 DIAGNOSIS — R739 Hyperglycemia, unspecified: Secondary | ICD-10-CM | POA: Diagnosis not present

## 2016-08-30 LAB — BASIC METABOLIC PANEL
BUN: 17 mg/dL (ref 6–23)
CHLORIDE: 100 meq/L (ref 96–112)
CO2: 31 mEq/L (ref 19–32)
Calcium: 10.3 mg/dL (ref 8.4–10.5)
Creatinine, Ser: 1.5 mg/dL (ref 0.40–1.50)
GFR: 58.11 mL/min — ABNORMAL LOW (ref >60.00)
GLUCOSE: 81 mg/dL (ref 70–99)
POTASSIUM: 3.7 meq/L (ref 3.5–5.1)
Sodium: 137 mEq/L (ref 135–145)

## 2016-08-30 LAB — HEMOGLOBIN A1C: Hgb A1c MFr Bld: 6.9 % — ABNORMAL HIGH (ref 4.6–6.5)

## 2016-08-30 MED ORDER — ZOSTER VAC RECOMB ADJUVANTED 50 MCG/0.5ML IM SUSR: 0.5000 mL | Freq: Once | INTRAMUSCULAR | 1 refills | Status: AC

## 2016-08-30 NOTE — Progress Notes (Signed)
Subjective:  Patient ID: Aaron [name], male    DOB: 02-13-1936  Age: 81 y.o. MRN: [id number]  CC: No chief complaint on file.   HPI Aaron [name] presents for GERD, dyslipidemia, anxiety, elevated PSA f/u  Outpatient Medications Prior to Visit  Medication Sig Dispense Refill  . aspirin 81 MG EC tablet Take 81 mg by mouth daily.      . CHOLECALCIFEROL PO Take 1,000 Units by mouth daily.    . clonazePAM (KLONOPIN) 1 MG tablet TAKE 1/2 TO 1 TABLET BY MOUTH TWICE DAILY AS NEEDED FOR ANXIETY 60 tablet 2  . clotrimazole-betamethasone (LOTRISONE) cream Apply topically 2 (two) times daily. 15 g 2  . levothyroxine (SYNTHROID, LEVOTHROID) 125 MCG tablet Take 1 tablet (125 mcg total) by mouth daily. 90 tablet 3  . loratadine (CLARITIN) 10 MG tablet Take 10 mg by mouth daily.     Marland Kitchen omeprazole (PRILOSEC) 40 MG capsule TAKE 1 CAPSULE BY MOUTH DAILY 90 capsule 2  . simvastatin (ZOCOR) 20 MG tablet TAKE 1 TABLET BY MOUTH DAILY 90 tablet 2   No facility-administered medications prior to visit.     ROS Review of Systems  Constitutional: Negative for appetite change, fatigue and unexpected weight change.  HENT: Negative for congestion, nosebleeds, sneezing, sore throat and trouble swallowing.   Eyes: Negative for itching and visual disturbance.  Respiratory: Negative for cough.   Cardiovascular: Negative for chest pain, palpitations and leg swelling.  Gastrointestinal: Negative for abdominal distention, blood in stool, diarrhea and nausea.  Genitourinary: Negative for frequency and hematuria.  Musculoskeletal: Negative for back pain, gait problem, joint swelling and neck pain.  Skin: Negative for rash.  Neurological: Negative for dizziness, tremors, speech difficulty and weakness.  Psychiatric/Behavioral: Negative for agitation, dysphoric mood and sleep disturbance. The patient is nervous/anxious.     Objective:  BP 118/68 (BP Location: Left Arm, Patient Position: Sitting, Cuff Size:  Large)   Pulse 70   Temp 97.8 F (36.6 C) (Oral)   Ht 6\' 3"  (1.905 m)   Wt 209 lb (94.8 kg)   SpO2 98%   BMI 26.12 kg/m   BP Readings from Last 3 Encounters:  08/30/16 118/68  08/28/16 104/70  08/09/16 106/67    Wt Readings from Last 3 Encounters:  08/30/16 209 lb (94.8 kg)  08/28/16 208 lb (94.3 kg)  08/09/16 205 lb (93 kg)    Physical Exam  Constitutional: He is oriented to person, place, and time. He appears well-developed. No distress.  NAD  HENT:  Mouth/Throat: Oropharynx is clear and moist.  Eyes: Conjunctivae are normal. Pupils are equal, round, and reactive to light.  Neck: Normal range of motion. No JVD present. No thyromegaly present.  Cardiovascular: Normal rate, regular rhythm, normal heart sounds and intact distal pulses.  Exam reveals no gallop and no friction rub.   No murmur heard. Pulmonary/Chest: Effort normal and breath sounds normal. No respiratory distress. He has no wheezes. He has no rales. He exhibits no tenderness.  Abdominal: Soft. Bowel sounds are normal. He exhibits no distension and no mass. There is no tenderness. There is no rebound and no guarding.  Musculoskeletal: Normal range of motion. He exhibits no edema or tenderness.  Lymphadenopathy:    He has no cervical adenopathy.  Neurological: He is alert and oriented to person, place, and time. He has normal reflexes. No cranial nerve deficit. He exhibits normal muscle tone. He displays a negative Romberg sign. Coordination and gait normal.  Skin: Skin is warm and  dry. No rash noted.  Psychiatric: He has a normal mood and affect. His behavior is normal. Judgment and thought content normal.    Lab Results  Component Value Date   WBC 3.7 (L) 08/18/2015   HGB 13.8 08/18/2015   HCT 42.6 08/18/2015   PLT 217 08/18/2015   GLUCOSE 100 (H) 06/04/2016   CHOL 153 08/18/2015   TRIG 150 (H) 08/18/2015   HDL 54 08/18/2015   LDLCALC 69 08/18/2015   ALT 14 08/18/2015   AST 28 08/18/2015   NA 137  06/04/2016   K 4.2 06/04/2016   CL 103 06/04/2016   CREATININE 1.44 06/04/2016   BUN 12 06/04/2016   CO2 28 06/04/2016   TSH 1.55 06/04/2016   PSA 7.52 (H) 08/05/2014   INR 1.19 04/28/2009   HGBA1C 6.9 (H) 06/04/2016    No results found.  Assessment & Plan:   There are no diagnoses linked to this encounter. I am having [name] maintain his aspirin, loratadine, clotrimazole-betamethasone, levothyroxine, omeprazole, clonazePAM, CHOLECALCIFEROL PO, and simvastatin.  No orders of the defined types were placed in this encounter.    Follow-up: No Follow-up on file.  Walker Kehr, MD

## 2016-08-30 NOTE — Patient Instructions (Signed)
MC well w/Jill 

## 2016-08-30 NOTE — Assessment & Plan Note (Signed)
labs

## 2016-08-30 NOTE — Assessment & Plan Note (Signed)
Free PSA 

## 2016-08-30 NOTE — Assessment & Plan Note (Signed)
Clonazepam prn low dose   Potential benefits of a long term benzodiazepines  use as well as potential risks  and complications were explained to the patient and were aknowledged. 1/18 stopped Lexapro

## 2016-08-30 NOTE — Assessment & Plan Note (Signed)
Simvastatin 

## 2016-08-31 LAB — PSA, TOTAL AND FREE
PSA, % Free: 16 % — ABNORMAL LOW (ref >25)
PSA, Free: 1.4 ng/mL
PSA, TOTAL: 9 ng/mL — AB (ref <=4.0)

## 2016-09-24 ENCOUNTER — Telehealth: Payer: Self-pay | Admitting: *Deleted

## 2016-09-24 MED ORDER — CLOTRIMAZOLE-BETAMETHASONE 1-0.05 % EX CREA: TOPICAL_CREAM | Freq: Two times a day (BID) | CUTANEOUS | 2 refills | Status: DC

## 2016-09-24 NOTE — Telephone Encounter (Signed)
Rec'd call wife requesting refill on the Clotrimazole cream. Inform will send to [facility]...Johny Chess

## 2016-10-22 ENCOUNTER — Other Ambulatory Visit: Payer: Self-pay

## 2016-11-22 DIAGNOSIS — H5319 Other subjective visual disturbances: Secondary | ICD-10-CM | POA: Diagnosis not present

## 2016-11-22 DIAGNOSIS — H40023 Open angle with borderline findings, high risk, bilateral: Secondary | ICD-10-CM | POA: Diagnosis not present

## 2016-11-22 DIAGNOSIS — H04123 Dry eye syndrome of bilateral lacrimal glands: Secondary | ICD-10-CM | POA: Diagnosis not present

## 2016-11-29 ENCOUNTER — Ambulatory Visit (INDEPENDENT_AMBULATORY_CARE_PROVIDER_SITE_OTHER): Payer: Medicare Other | Admitting: Internal Medicine

## 2016-11-29 ENCOUNTER — Encounter: Payer: Self-pay | Admitting: Internal Medicine

## 2016-11-29 ENCOUNTER — Other Ambulatory Visit (INDEPENDENT_AMBULATORY_CARE_PROVIDER_SITE_OTHER): Payer: Medicare Other

## 2016-11-29 DIAGNOSIS — Z23 Encounter for immunization: Secondary | ICD-10-CM

## 2016-11-29 DIAGNOSIS — F411 Generalized anxiety disorder: Secondary | ICD-10-CM

## 2016-11-29 DIAGNOSIS — E039 Hypothyroidism, unspecified: Secondary | ICD-10-CM | POA: Diagnosis not present

## 2016-11-29 DIAGNOSIS — K2100 Gastro-esophageal reflux disease with esophagitis, without bleeding: Secondary | ICD-10-CM

## 2016-11-29 DIAGNOSIS — R739 Hyperglycemia, unspecified: Secondary | ICD-10-CM

## 2016-11-29 DIAGNOSIS — R972 Elevated prostate specific antigen [PSA]: Secondary | ICD-10-CM

## 2016-11-29 DIAGNOSIS — I2510 Atherosclerotic heart disease of native coronary artery without angina pectoris: Secondary | ICD-10-CM

## 2016-11-29 LAB — BASIC METABOLIC PANEL
BUN: 16 mg/dL (ref 6–23)
CHLORIDE: 101 meq/L (ref 96–112)
CO2: 31 mEq/L (ref 19–32)
CREATININE: 1.43 mg/dL (ref 0.40–1.50)
Calcium: 10.3 mg/dL (ref 8.4–10.5)
GFR: 61.36 mL/min (ref >60.00)
GLUCOSE: 111 mg/dL — AB (ref 70–99)
POTASSIUM: 4.3 meq/L (ref 3.5–5.1)
Sodium: 139 mEq/L (ref 135–145)

## 2016-11-29 LAB — HEPATIC FUNCTION PANEL
ALBUMIN: 4.5 g/dL (ref 3.5–5.2)
ALK PHOS: 77 U/L (ref 39–117)
ALT: 13 U/L (ref 0–53)
AST: 25 U/L (ref 0–37)
Bilirubin, Direct: 0.1 mg/dL (ref 0.0–0.3)
TOTAL PROTEIN: 7.8 g/dL (ref 6.0–8.3)
Total Bilirubin: 0.4 mg/dL (ref 0.2–1.2)

## 2016-11-29 LAB — PSA: PSA: 9.88 ng/mL — ABNORMAL HIGH (ref 0.10–4.00)

## 2016-11-29 LAB — HEMOGLOBIN A1C: HEMOGLOBIN A1C: 6.8 % — AB (ref 4.6–6.5)

## 2016-11-29 NOTE — Assessment & Plan Note (Signed)
Repeat PSA Urol ref discussed

## 2016-11-29 NOTE — Assessment & Plan Note (Signed)
prilosec

## 2016-11-29 NOTE — Progress Notes (Signed)
Subjective:  Patient ID: Aaron [name], male    DOB: 1935/11/29  Age: 81 y.o. MRN: [id number]  CC: Follow-up   HPI Styles Fambro presents for anxiety, GERD, dyslipidemia f/u  Outpatient Medications Prior to Visit  Medication Sig Dispense Refill  . aspirin 81 MG EC tablet Take 81 mg by mouth daily.      . CHOLECALCIFEROL PO Take 1,000 Units by mouth daily.    . clonazePAM (KLONOPIN) 1 MG tablet TAKE 1/2 TO 1 TABLET BY MOUTH TWICE DAILY AS NEEDED FOR ANXIETY 60 tablet 2  . clotrimazole-betamethasone (LOTRISONE) cream Apply topically 2 (two) times daily. 15 g 2  . levothyroxine (SYNTHROID, LEVOTHROID) 125 MCG tablet Take 1 tablet (125 mcg total) by mouth daily. 90 tablet 3  . loratadine (CLARITIN) 10 MG tablet Take 10 mg by mouth daily.     Marland Kitchen omeprazole (PRILOSEC) 40 MG capsule TAKE 1 CAPSULE BY MOUTH DAILY 90 capsule 2  . simvastatin (ZOCOR) 20 MG tablet TAKE 1 TABLET BY MOUTH DAILY 90 tablet 2   No facility-administered medications prior to visit.     ROS Review of Systems  Constitutional: Negative for appetite change, fatigue and unexpected weight change.  HENT: Negative for congestion, nosebleeds, sneezing, sore throat and trouble swallowing.   Eyes: Negative for itching and visual disturbance.  Respiratory: Negative for cough.   Cardiovascular: Negative for chest pain, palpitations and leg swelling.  Gastrointestinal: Negative for abdominal distention, blood in stool, diarrhea and nausea.  Genitourinary: Negative for frequency and hematuria.  Musculoskeletal: Negative for back pain, gait problem, joint swelling and neck pain.  Skin: Negative for rash.  Neurological: Negative for dizziness, tremors, speech difficulty and weakness.  Psychiatric/Behavioral: Negative for agitation, dysphoric mood and sleep disturbance. The patient is not nervous/anxious.     Objective:  BP 116/80   Pulse 74   Temp 98.4 F (36.9 C) (Oral)   Ht 6\' 3"  (1.905 m)   Wt 207 lb 12.8 oz (94.3  kg)   SpO2 96%   BMI 25.97 kg/m   BP Readings from Last 3 Encounters:  11/29/16 116/80  08/30/16 118/68  08/28/16 104/70    Wt Readings from Last 3 Encounters:  11/29/16 207 lb 12.8 oz (94.3 kg)  08/30/16 209 lb (94.8 kg)  08/28/16 208 lb (94.3 kg)    Physical Exam  Constitutional: He is oriented to person, place, and time. He appears well-developed. No distress.  NAD  HENT:  Mouth/Throat: Oropharynx is clear and moist.  Eyes: Pupils are equal, round, and reactive to light. Conjunctivae are normal.  Neck: Normal range of motion. No JVD present. No thyromegaly present.  Cardiovascular: Normal rate, regular rhythm, normal heart sounds and intact distal pulses.  Exam reveals no gallop and no friction rub.   No murmur heard. Pulmonary/Chest: Effort normal and breath sounds normal. No respiratory distress. He has no wheezes. He has no rales. He exhibits no tenderness.  Abdominal: Soft. Bowel sounds are normal. He exhibits no distension and no mass. There is no tenderness. There is no rebound and no guarding.  Musculoskeletal: Normal range of motion. He exhibits no edema or tenderness.  Lymphadenopathy:    He has no cervical adenopathy.  Neurological: He is alert and oriented to person, place, and time. He has normal reflexes. No cranial nerve deficit. He exhibits normal muscle tone. He displays a negative Romberg sign. Coordination and gait normal.  Skin: Skin is warm and dry. No rash noted.  Psychiatric: He has a normal mood  and affect. His behavior is normal. Judgment and thought content normal.    Lab Results  Component Value Date   WBC 3.7 (L) 08/18/2015   HGB 13.8 08/18/2015   HCT 42.6 08/18/2015   PLT 217 08/18/2015   GLUCOSE 81 08/30/2016   CHOL 153 08/18/2015   TRIG 150 (H) 08/18/2015   HDL 54 08/18/2015   LDLCALC 69 08/18/2015   ALT 14 08/18/2015   AST 28 08/18/2015   NA 137 08/30/2016   K 3.7 08/30/2016   CL 100 08/30/2016   CREATININE 1.50 08/30/2016   BUN  17 08/30/2016   CO2 31 08/30/2016   TSH 1.55 06/04/2016   PSA 7.52 (H) 08/05/2014   INR 1.19 04/28/2009   HGBA1C 6.9 (H) 08/30/2016    No results found.  Assessment & Plan:   There are no diagnoses linked to this encounter. I am having Mr. Favero maintain his aspirin, loratadine, levothyroxine, omeprazole, clonazePAM, CHOLECALCIFEROL PO, simvastatin, and clotrimazole-betamethasone.  No orders of the defined types were placed in this encounter.    Follow-up: No Follow-up on file.  Walker Kehr, MD

## 2016-11-29 NOTE — Assessment & Plan Note (Signed)
levothroid  

## 2016-11-29 NOTE — Assessment & Plan Note (Signed)
Clonazepam prn 

## 2016-11-29 NOTE — Assessment & Plan Note (Signed)
On ASA, Simvastatin 

## 2016-11-29 NOTE — Assessment & Plan Note (Signed)
A1c

## 2017-01-02 ENCOUNTER — Other Ambulatory Visit: Payer: Self-pay | Admitting: *Deleted

## 2017-01-02 ENCOUNTER — Telehealth: Payer: Self-pay | Admitting: *Deleted

## 2017-01-02 MED ORDER — LEVOTHYROXINE SODIUM 125 MCG PO TABS: 125.0000 ug | ORAL_TABLET | Freq: Every day | ORAL | 3 refills | Status: DC

## 2017-01-02 NOTE — Telephone Encounter (Signed)
[address] rx request sent Levothyroxine 0.158mcg  #90  Last seen 11/29/16

## 2017-01-22 ENCOUNTER — Other Ambulatory Visit: Payer: Self-pay | Admitting: Internal Medicine

## 2017-02-18 ENCOUNTER — Other Ambulatory Visit: Payer: Self-pay

## 2017-02-20 MED ORDER — CLONAZEPAM 1 MG PO TABS: 0.5000 mg | ORAL_TABLET | Freq: Two times a day (BID) | ORAL | 3 refills | Status: DC | PRN

## 2017-02-28 ENCOUNTER — Ambulatory Visit: Payer: Self-pay | Admitting: Internal Medicine

## 2017-03-14 ENCOUNTER — Other Ambulatory Visit: Payer: Self-pay

## 2017-03-14 MED ORDER — SIMVASTATIN 20 MG PO TABS: 20.0000 mg | ORAL_TABLET | Freq: Every day | ORAL | 1 refills | Status: DC

## 2017-04-04 ENCOUNTER — Other Ambulatory Visit (INDEPENDENT_AMBULATORY_CARE_PROVIDER_SITE_OTHER): Payer: Medicare Other

## 2017-04-04 ENCOUNTER — Ambulatory Visit (INDEPENDENT_AMBULATORY_CARE_PROVIDER_SITE_OTHER): Payer: Medicare Other | Admitting: Internal Medicine

## 2017-04-04 ENCOUNTER — Encounter: Payer: Self-pay | Admitting: Internal Medicine

## 2017-04-04 VITALS — BP 118/72 | HR 74 | Temp 97.8°F | Ht 75.0 in | Wt 206.0 lb

## 2017-04-04 DIAGNOSIS — K2100 Gastro-esophageal reflux disease with esophagitis, without bleeding: Secondary | ICD-10-CM

## 2017-04-04 DIAGNOSIS — C154 Malignant neoplasm of middle third of esophagus: Secondary | ICD-10-CM

## 2017-04-04 DIAGNOSIS — I10 Essential (primary) hypertension: Secondary | ICD-10-CM | POA: Diagnosis not present

## 2017-04-04 DIAGNOSIS — I2510 Atherosclerotic heart disease of native coronary artery without angina pectoris: Secondary | ICD-10-CM | POA: Diagnosis not present

## 2017-04-04 DIAGNOSIS — R7309 Other abnormal glucose: Secondary | ICD-10-CM

## 2017-04-04 DIAGNOSIS — D500 Iron deficiency anemia secondary to blood loss (chronic): Secondary | ICD-10-CM | POA: Diagnosis not present

## 2017-04-04 DIAGNOSIS — R972 Elevated prostate specific antigen [PSA]: Secondary | ICD-10-CM

## 2017-04-04 LAB — CBC WITH DIFFERENTIAL/PLATELET
BASOS ABS: 0 10*3/uL (ref 0.0–0.1)
Basophils Relative: 0.4 % (ref 0.0–3.0)
Eosinophils Absolute: 0.2 10*3/uL (ref 0.0–0.7)
Eosinophils Relative: 5.3 % — ABNORMAL HIGH (ref 0.0–5.0)
HEMATOCRIT: 44.1 % (ref 39.0–52.0)
HEMOGLOBIN: 14.5 g/dL (ref 13.0–17.0)
LYMPHS PCT: 24.8 % (ref 12.0–46.0)
Lymphs Abs: 1 10*3/uL (ref 0.7–4.0)
MCHC: 32.8 g/dL (ref 30.0–36.0)
MCV: 90 fl (ref 78.0–100.0)
MONOS PCT: 11.6 % (ref 3.0–12.0)
Monocytes Absolute: 0.5 10*3/uL (ref 0.1–1.0)
NEUTROS ABS: 2.3 10*3/uL (ref 1.4–7.7)
Neutrophils Relative %: 57.9 % (ref 43.0–77.0)
PLATELETS: 178 10*3/uL (ref 150.0–400.0)
RBC: 4.9 Mil/uL (ref 4.22–5.81)
RDW: 14.3 % (ref 11.5–15.5)
WBC: 3.9 10*3/uL — ABNORMAL LOW (ref 4.0–10.5)

## 2017-04-04 LAB — BASIC METABOLIC PANEL
BUN: 18 mg/dL (ref 6–23)
CALCIUM: 9.7 mg/dL (ref 8.4–10.5)
CHLORIDE: 100 meq/L (ref 96–112)
CO2: 29 meq/L (ref 19–32)
Creatinine, Ser: 1.45 mg/dL (ref 0.40–1.50)
GFR: 60.33 mL/min (ref >60.00)
Glucose, Bld: 108 mg/dL — ABNORMAL HIGH (ref 70–99)
Potassium: 4.3 mEq/L (ref 3.5–5.1)
SODIUM: 137 meq/L (ref 135–145)

## 2017-04-04 LAB — PSA: PSA: 13.12 ng/mL — AB (ref 0.10–4.00)

## 2017-04-04 LAB — HEMOGLOBIN A1C: HEMOGLOBIN A1C: 6.8 % — AB (ref 4.6–6.5)

## 2017-04-04 MED ORDER — SIMVASTATIN 20 MG PO TABS: 20.0000 mg | ORAL_TABLET | Freq: Every day | ORAL | 3 refills | Status: DC

## 2017-04-04 MED ORDER — LEVOTHYROXINE SODIUM 125 MCG PO TABS: 125.0000 ug | ORAL_TABLET | Freq: Every day | ORAL | 3 refills | Status: DC

## 2017-04-04 MED ORDER — CLONAZEPAM 1 MG PO TABS: 0.5000 mg | ORAL_TABLET | Freq: Two times a day (BID) | ORAL | 1 refills | Status: DC | PRN

## 2017-04-04 MED ORDER — OMEPRAZOLE 40 MG PO CPDR: 40.0000 mg | DELAYED_RELEASE_CAPSULE | Freq: Every day | ORAL | 3 refills | Status: DC

## 2017-04-04 NOTE — Assessment & Plan Note (Signed)
F/u w/Dr Stark 

## 2017-04-04 NOTE — Assessment & Plan Note (Signed)
Prilosec 

## 2017-04-04 NOTE — Assessment & Plan Note (Signed)
ASA Simvastatin 

## 2017-04-04 NOTE — Assessment & Plan Note (Signed)
NAS diet 

## 2017-04-04 NOTE — Progress Notes (Signed)
Subjective:  Patient ID: Aaron [name], male    DOB: 05-Jul-1935  Age: 82 y.o. MRN: [id number]  CC: No chief complaint on file.   HPI Eliberto Sole presents for GERD, anxiety, dyslipidemia f/u  Outpatient Medications Prior to Visit  Medication Sig Dispense Refill  . aspirin 81 MG EC tablet Take 81 mg by mouth daily.      . CHOLECALCIFEROL PO Take 1,000 Units by mouth daily.    . clonazePAM (KLONOPIN) 1 MG tablet Take 0.5-1 tablets (0.5-1 mg total) by mouth 2 (two) times daily as needed. for anxiety 60 tablet 3  . clotrimazole-betamethasone (LOTRISONE) cream Apply topically 2 (two) times daily. 15 g 2  . levothyroxine (SYNTHROID, LEVOTHROID) 125 MCG tablet Take 1 tablet (125 mcg total) by mouth daily. 90 tablet 3  . loratadine (CLARITIN) 10 MG tablet Take 10 mg by mouth daily.     Marland Kitchen omeprazole (PRILOSEC) 40 MG capsule TAKE 1 CAPSULE BY MOUTH DAILY 90 capsule 2  . simvastatin (ZOCOR) 20 MG tablet Take 1 tablet (20 mg total) by mouth daily. 90 tablet 1  . omeprazole (PRILOSEC) 40 MG capsule TAKE 1 CAPSULE BY MOUTH DAILY (Patient not taking: Reported on 04/04/2017) 90 capsule 2   No facility-administered medications prior to visit.     ROS Review of Systems  Constitutional: Negative for appetite change, fatigue and unexpected weight change.  HENT: Negative for congestion, nosebleeds, sneezing, sore throat and trouble swallowing.   Eyes: Negative for itching and visual disturbance.  Respiratory: Negative for cough.   Cardiovascular: Negative for chest pain, palpitations and leg swelling.  Gastrointestinal: Negative for abdominal distention, blood in stool, diarrhea and nausea.  Genitourinary: Negative for frequency and hematuria.  Musculoskeletal: Negative for back pain, gait problem, joint swelling and neck pain.  Skin: Negative for rash.  Neurological: Negative for dizziness, tremors, speech difficulty and weakness.  Psychiatric/Behavioral: Positive for sleep disturbance.  Negative for agitation and dysphoric mood. The patient is nervous/anxious.     Objective:  BP 118/72 (BP Location: Left Arm, Patient Position: Sitting, Cuff Size: Large)   Pulse 74   Temp 97.8 F (36.6 C) (Oral)   Ht 6\' 3"  (1.905 m)   Wt 206 lb (93.4 kg)   SpO2 99%   BMI 25.75 kg/m   BP Readings from Last 3 Encounters:  04/04/17 118/72  11/29/16 116/80  08/30/16 118/68    Wt Readings from Last 3 Encounters:  04/04/17 206 lb (93.4 kg)  11/29/16 207 lb 12.8 oz (94.3 kg)  08/30/16 209 lb (94.8 kg)    Physical Exam  Constitutional: He is oriented to person, place, and time. He appears well-developed. No distress.  NAD  HENT:  Mouth/Throat: Oropharynx is clear and moist.  Eyes: Conjunctivae are normal. Pupils are equal, round, and reactive to light.  Neck: Normal range of motion. No JVD present. No thyromegaly present.  Cardiovascular: Normal rate, regular rhythm, normal heart sounds and intact distal pulses. Exam reveals no gallop and no friction rub.  No murmur heard. Pulmonary/Chest: Effort normal and breath sounds normal. No respiratory distress. He has no wheezes. He has no rales. He exhibits no tenderness.  Abdominal: Soft. Bowel sounds are normal. He exhibits no distension and no mass. There is no tenderness. There is no rebound and no guarding.  Musculoskeletal: Normal range of motion. He exhibits no edema or tenderness.  Lymphadenopathy:    He has no cervical adenopathy.  Neurological: He is alert and oriented to person, place, and time. He  has normal reflexes. No cranial nerve deficit. He exhibits normal muscle tone. He displays a negative Romberg sign. Coordination and gait normal.  Skin: Skin is warm and dry. No rash noted.  Psychiatric: He has a normal mood and affect. His behavior is normal. Judgment and thought content normal.    Lab Results  Component Value Date   WBC 3.7 (L) 08/18/2015   HGB 13.8 08/18/2015   HCT 42.6 08/18/2015   PLT 217 08/18/2015    GLUCOSE 111 (H) 11/29/2016   CHOL 153 08/18/2015   TRIG 150 (H) 08/18/2015   HDL 54 08/18/2015   LDLCALC 69 08/18/2015   ALT 13 11/29/2016   AST 25 11/29/2016   NA 139 11/29/2016   K 4.3 11/29/2016   CL 101 11/29/2016   CREATININE 1.43 11/29/2016   BUN 16 11/29/2016   CO2 31 11/29/2016   TSH 1.55 06/04/2016   PSA 9.88 (H) 11/29/2016   INR 1.19 04/28/2009   HGBA1C 6.8 (H) 11/29/2016    No results found.  Assessment & Plan:   There are no diagnoses linked to this encounter. I am having Britt Bolognese maintain his aspirin, loratadine, CHOLECALCIFEROL PO, clotrimazole-betamethasone, levothyroxine, omeprazole, clonazePAM, and simvastatin.  No orders of the defined types were placed in this encounter.    Follow-up: No Follow-up on file.  Walker Kehr, MD

## 2017-04-04 NOTE — Assessment & Plan Note (Signed)
Monitor CBC 

## 2017-05-02 ENCOUNTER — Telehealth: Payer: Self-pay | Admitting: Oncology

## 2017-05-02 ENCOUNTER — Inpatient Hospital Stay: Payer: Medicare Other | Attending: Oncology | Admitting: Oncology

## 2017-05-02 VITALS — BP 117/62 | HR 102 | Temp 100.2°F | Resp 18 | Ht 75.0 in | Wt 208.5 lb

## 2017-05-02 DIAGNOSIS — I10 Essential (primary) hypertension: Secondary | ICD-10-CM | POA: Diagnosis not present

## 2017-05-02 DIAGNOSIS — Z923 Personal history of irradiation: Secondary | ICD-10-CM | POA: Diagnosis not present

## 2017-05-02 DIAGNOSIS — Z9221 Personal history of antineoplastic chemotherapy: Secondary | ICD-10-CM | POA: Diagnosis not present

## 2017-05-02 DIAGNOSIS — C154 Malignant neoplasm of middle third of esophagus: Secondary | ICD-10-CM

## 2017-05-02 DIAGNOSIS — Z8501 Personal history of malignant neoplasm of esophagus: Secondary | ICD-10-CM | POA: Diagnosis not present

## 2017-05-02 NOTE — Progress Notes (Signed)
Aaron [name]   Diagnosis: Esophagus cancer  INTERVAL HISTORY:   Aaron [name] returns as scheduled.  He feels well.  Good appetite.  He is working.  He reports a recent "cold" with rhinorrhea and a cough.  He is being followed by [name] for an elevated PSA.  No difficulty with urination.  Objective:  Vital signs in last 24 hours:  Blood pressure 117/62, pulse (!) 102, temperature 100.2 F (37.9 C), temperature source Oral, resp. rate 18, height 6' 3"  (1.905 m), weight 208 lb 8 oz (94.6 kg), SpO2 95 %.    HEENT: Neck without mass Lymphatics: No cervical, supraclavicular, axillary, or inguinal nodes Resp: Lungs clear bilaterally Cardio: Irregular GI: No hepatomegaly, no mass Vascular: No leg edema   Lab Results:  Lab Results  Component Value Date   WBC 3.9 (L) 04/04/2017   HGB 14.5 04/04/2017   HCT 44.1 04/04/2017   MCV 90.0 04/04/2017   PLT 178.0 04/04/2017   NEUTROABS 2.3 04/04/2017    CMP     Component Value Date/Time   NA 137 04/04/2017 0953   K 4.3 04/04/2017 0953   CL 100 04/04/2017 0953   CO2 29 04/04/2017 0953   GLUCOSE 108 (H) 04/04/2017 0953   GLUCOSE 100 (H) 02/26/2006 1420   BUN 18 04/04/2017 0953   CREATININE 1.45 04/04/2017 0953   CREATININE 1.46 (H) 08/18/2015 0803   CALCIUM 9.7 04/04/2017 0953   PROT 7.8 11/29/2016 1015   ALBUMIN 4.5 11/29/2016 1015   AST 25 11/29/2016 1015   ALT 13 11/29/2016 1015   ALKPHOS 77 11/29/2016 1015   BILITOT 0.4 11/29/2016 1015   GFRNONAA >60 08/15/2009 0634   GFRAA  08/15/2009 0634    >60        The eGFR has been calculated using the MDRD equation. This calculation has not been validated in all clinical situations. eGFR's persistently <60 mL/min signify possible Chronic Kidney Disease.    Medications: I have reviewed the patient's current medications.   Assessment/Plan: 1. Squamous cell carcinoma of the esophagus, status post an endoscopic biopsy, 04/07/2009,  with the pathology confirming at least superficial invasion. 2. PET scan, 04/20/2009, confirmed hypermetabolic activity associated with the upper and thoracic esophagus mass with a single hypermetabolic right paratracheal lymph node. He began concurrent radiation and weekly Taxol/carboplatin chemotherapy, 05/03/2009. The last treatment with Taxol/carboplatin was given 06/14/2009. He completed radiation on 06/16/2009.  3. Solid/liquid dysphagia secondary to the proximal esophageal mass, resolved. He is status post an esophageal dilatation for management of a stricture, 08/24/2009, repeat esophageal dilatation 10/03/2011. 4. Borderline enlarged mediastinal and gastrohepatic lymph nodes on staging CT scans. PET scan 04/20/2009 confirmed hypermetabolic activity associated with the right paratracheal lymph node. 5. Hypertension. 6. Status post placement of a feeding jejunostomy tube by Dr. Johney Maine. The jejunostomy tube has been removed. 7. Admission with neutropenia and anemia, March 2011.  8. Fever of unknown origin, April 2011, resolved. He was treated with prednisone beginning on 07/13/2009 for presumed radiation pneumonitis. He developed episodes of rigors on prednisone, and the prednisone was discontinued, 07/15/2009. A CT scan did not reveal evidence for an infectious process. 9. Zoster rash at the right back and anterior chest July 2013. 10. Questionable leukoplakia on exam 04/09/2013-he was evaluated by dental medicine   Disposition: Aaron [name] remains in clinical remission from esophagus cancer.  He would like to continue follow-up at the Cancer center.  He will return for an office visit in 1 year.  He will discussed the elevated PSA and indication for a urology referral with [name].  He has a family history of prostate cancer.  15 minutes were spent with the patient today.  The majority of the time was used for counseling and coordination of care.  [name], MD  05/02/2017  8:13  AM

## 2017-05-02 NOTE — Telephone Encounter (Signed)
Patient scheduled for 2020 return visit.  AVS/Calendar was printed and given to patient.

## 2017-05-30 DIAGNOSIS — H25013 Cortical age-related cataract, bilateral: Secondary | ICD-10-CM | POA: Diagnosis not present

## 2017-05-30 DIAGNOSIS — H04123 Dry eye syndrome of bilateral lacrimal glands: Secondary | ICD-10-CM | POA: Diagnosis not present

## 2017-05-30 DIAGNOSIS — H40023 Open angle with borderline findings, high risk, bilateral: Secondary | ICD-10-CM | POA: Diagnosis not present

## 2017-05-30 DIAGNOSIS — H2513 Age-related nuclear cataract, bilateral: Secondary | ICD-10-CM | POA: Diagnosis not present

## 2017-06-18 ENCOUNTER — Ambulatory Visit: Payer: Self-pay

## 2017-06-18 NOTE — Telephone Encounter (Signed)
Pt calling c/o lightheadedness that he attributes to the Klonopin he takes for "shakiness." He stated that he is unsteady walking. Pt stated that when he took the Klonopin filled at [facility] he did not have lightheadedness. Pt stated the pills he takes for [employer] is a different color and he thinks that is the reason he is lightheaded. Pt also c/o a "popping or squeaking noise" in his ear. Pt took his BP and HR according to pt that is his normal (108/75 HR 90). No openings with PCP so appt made with Jodi Mourning FNP for tomorrow.  Reason for Disposition . Taking a medicine that could cause dizziness (e.g., blood pressure medications, diuretics)    Klonopin . [1] MODERATE dizziness (e.g., interferes with normal activities) AND [2] has NOT been evaluated by physician for this  (Exception: dizziness caused by heat exposure, sudden standing, or poor fluid intake)  Answer Assessment - Initial Assessment Questions 1. DESCRIPTION: "Describe your dizziness."     Unsteady gait with walking 2. LIGHTHEADED: "Do you feel lightheaded?" (e.g., somewhat faint, woozy, weak upon standing)     yes 3. VERTIGO: "Do you feel like either you or the room is spinning or tilting?" (i.e. vertigo)     no 4. SEVERITY: "How bad is it?"  "Do you feel like you are going to faint?" "Can you stand and walk?"   - MILD - walking normally   - MODERATE - interferes with normal activities (e.g., work, school)    - SEVERE - unable to stand, requires support to walk, feels like passing out now.      Doesn't feel like he is going to faint. But feels unsteady when walking Feels "wobbly and unstable" 5. ONSET:  "When did the dizziness begin?"     yesterday 6. AGGRAVATING FACTORS: "Does anything make it worse?" (e.g., standing, change in head position)     Walking 7. HEART RATE: "Can you tell me your heart rate?" "How many beats in 15 seconds?"  (Note: not all patients can do this)       Pt cannot perform- using BP machine to check  108/75 and 90 bpm- pt states that his BP is what it normally runs. Off BP meds 7.5 years 8. CAUSE: "What do you think is causing the dizziness?"     Klonopin from [employer] (thinks a different manufactured Klonopin is causing his dizziness) Wants to change pharmacy back to [facility]. 9. RECURRENT SYMPTOM: "Have you had dizziness before?" If so, ask: "When was the last time?" "What happened that time?"     No except in the latter stages of cancer treatment from anxiety 10. OTHER SYMPTOMS: "Do you have any other symptoms?" (e.g., fever, chest pain, vomiting, diarrhea, bleeding)       no 11. PREGNANCY: "Is there any chance you are pregnant?" "When was your last menstrual period?"       n/a  Protocols used: DIZZINESS [facility]

## 2017-06-19 ENCOUNTER — Encounter: Payer: Self-pay | Admitting: Family

## 2017-06-19 ENCOUNTER — Ambulatory Visit (INDEPENDENT_AMBULATORY_CARE_PROVIDER_SITE_OTHER): Payer: Medicare Other | Admitting: Family

## 2017-06-19 VITALS — BP 110/70 | HR 91 | Temp 98.4°F | Ht 75.0 in | Wt 209.1 lb

## 2017-06-19 DIAGNOSIS — R42 Dizziness and giddiness: Secondary | ICD-10-CM

## 2017-06-19 DIAGNOSIS — H6121 Impacted cerumen, right ear: Secondary | ICD-10-CM | POA: Diagnosis not present

## 2017-06-19 DIAGNOSIS — R972 Elevated prostate specific antigen [PSA]: Secondary | ICD-10-CM | POA: Diagnosis not present

## 2017-06-19 NOTE — Progress Notes (Signed)
Aaron [name] is a 82 y.o. male with the following history as recorded in EpicCare:  Patient Active Problem List   Diagnosis Date Noted  . Elevated PSA 08/30/2016  . Generalized anxiety disorder 09/01/2015  . Wart of scalp 01/31/2015  . Cardiac murmur 12/20/2014  . Well adult exam 01/22/2013  . Neoplasm of uncertain behavior of skin 01/22/2013  . CAD (coronary artery disease) 11/27/2012  . Cerumen impaction 10/16/2012  . Angular stomatitis 03/13/2012  . Esophageal cancer ([place])   . Arrhythmia   . Knee pain, left 12/27/2010  . Hypothyroidism 06/02/2010  . Keloid scar 06/02/2010  . COPD 02/24/2010  . COUGH 02/24/2010  . SEBACEOUS CYST, INFECTED 12/20/2009  . INVOLUNTARY MOVEMENTS, ABNORMAL 08/17/2009  . Malignant neoplasm of thoracic esophagus ([place]) 06/13/2009  . Iron deficiency anemia 06/13/2009  . FEVER UNSPECIFIED 06/13/2009  . Stricture and stenosis of esophagus 04/06/2009  . DYSPHAGIA 04/06/2009  . TOBACCO USE, QUIT 03/01/2009  . RASH-NONVESICULAR 03/23/2008  . ALLERGIC RHINITIS 03/04/2008  . CRAMPS,LEG 09/04/2007  . Hyperglycemia 09/04/2007  . SINUSITIS, ACUTE 02/28/2007  . GERD 02/28/2007  . Dyslipidemia 01/27/2007  . Essential hypertension 01/27/2007  . DIVERTICULOSIS, COLON 01/27/2007    Current Outpatient Medications  Medication Sig Dispense Refill  . aspirin 81 MG EC tablet Take 81 mg by mouth daily.      . CHOLECALCIFEROL PO Take 1,000 Units by mouth daily.    . clonazePAM (KLONOPIN) 1 MG tablet Take 0.5-1 tablets (0.5-1 mg total) by mouth 2 (two) times daily as needed. for anxiety 180 tablet 1  . clotrimazole-betamethasone (LOTRISONE) cream Apply topically 2 (two) times daily. 15 g 2  . Dextromethorphan-guaiFENesin (MUCUS RELIEF DM COUGH) 20-400 MG TABS Take by mouth.    . levothyroxine (SYNTHROID, LEVOTHROID) 125 MCG tablet Take 1 tablet (125 mcg total) by mouth daily. 90 tablet 3  . omeprazole (PRILOSEC) 40 MG capsule Take 1 capsule (40 mg total) by mouth  daily. 90 capsule 3  . simvastatin (ZOCOR) 20 MG tablet Take 1 tablet (20 mg total) by mouth daily. 90 tablet 3  . loratadine (CLARITIN) 10 MG tablet Take 10 mg by mouth daily.      No current facility-administered medications for this visit.     Allergies: Amoxicillin; Cefuroxime axetil; Fluconazole; and Escitalopram  Past Medical History:  Diagnosis Date  . Allergic rhinitis   . Arrhythmia   . CAD (coronary artery disease)    mild  . COPD (chronic obstructive pulmonary disease) ([place])   . Diverticulosis of colon   . Elevated glucose   . Esophageal cancer ([place]) 2011   squamous cell ca - had J-tube for some time, now removed   . Esophageal stricture   . Gastritis   . GERD (gastroesophageal reflux disease)   . Hemorrhoids   . Hiatal hernia   . History of chemotherapy 05/03/09-06/16/09   Taxol/carboplatin  . History of nuclear stress test 07/2008   exercise; mild-mod perfusion dfect in basal inf/mid inf/apical inf regions; EKG negative for ischemia  . History of radiation therapy 05/03/09-06/16/09   squamous cell ca of esophagus  . HTN (hypertension)    mild diastolic dysfunction   . Hyperlipidemia    [name]  . Hypothyroid    s/p radiotherapy    Past Surgical History:  Procedure Laterality Date  . CARDIAC CATHETERIZATION  08/2008   mild nonobstructive CAD with 30% LAD stenosis prox & 20% OM1 narrowing   . CREATION OF CUTANEOUS STOMA  2011  . ESOPHAGOGASTRODUODENOSCOPY  10/03/2011   Procedure: ESOPHAGOGASTRODUODENOSCOPY (EGD);  Surgeon: [name], MD,FACG;  Location: [name];  Service: Endoscopy;  Laterality: N/A;  no fluro needed  . Lower Back Cyst Removed     [facility]  . [name] DILATION  10/03/2011   Procedure: [name] DILATION;  Surgeon: [name], MD,FACG;  Location: [facility];  Service: Endoscopy;  Laterality: N/A;  . TONSILLECTOMY     age 45  . TRANSTHORACIC ECHOCARDIOGRAM  06/2009   EF=>55%; trace MR/TR    Family History  Problem Relation Age of  Onset  . Prostate cancer Father        62's  . Diabetes Father   . Stroke Mother        aneurism  . Stroke Maternal Grandfather 35  . Prostate cancer Brother 44  . Prostate cancer Brother 66  . Colon cancer Sister   . Coronary artery disease Neg Hx   . Hypertension Neg Hx     Social History   Tobacco Use  . Smoking status: Former Smoker    Packs/day: 1.00    Years: 40.00    Pack years: 40.00    Last attempt to quit: 01/16/2001    Years since quitting: 16.4  . Smokeless tobacco: Never Used  . Tobacco comment: Quit 2002  Substance Use Topics  . Alcohol use: Yes    Alcohol/week: 0.0 oz    Comment: occasional drink 2-3 a month    Subjective:  Patient has history of anxiety/ panic attacks; first diagnosed after going through cancer treatment a few years ago; notes that he has typically been able to sense when he needed to use the Klonopin and was not taking daily- "only just as needed." However, recently got a new bottle of Klonopin from mail order pharmacy and was taking every night- "just didn't think about it and was following directions on medication." Realized last night after talking to triage nurse that the [place] might be causing the problem; has been taking full Klonopin tablet daily x 2 months; held the medication last night and feel totally different today; Notes he has felt this way before when he has taken full dose of Klonopin for extended period; states that he feels like he is  80% better today; Denies any chest pain, shortness of breath, blurred vision or headache.   Objective:  Vitals:   06/19/17 1438  BP: 110/70  Pulse: 91  Temp: 98.4 F (36.9 C)  TempSrc: Oral  SpO2: 97%  Weight: 209 lb 1.9 oz (94.9 kg)  Height: 6\' 3"  (1.905 m)    General: Well developed, well nourished, in no acute distress  Skin : Warm and dry.  Head: Normocephalic and atraumatic  Eyes: Sclera and conjunctiva clear; pupils round and reactive to light; extraocular movements intact   Ears: External normal; canals clear; after lavage, tympanic membranes normal  Oropharynx: Pink, supple. No suspicious lesions  Neck: Supple without thyromegaly, adenopathy  Lungs: Respirations unlabored; clear to auscultation bilaterally without wheeze, rales, rhonchi  CVS exam: normal rate and regular rhythm.  Neurologic: Alert and oriented; speech intact; face symmetrical; moves all extremities well; CNII-XII intact without focal deficit   Assessment:  1. Dizziness   2. Impacted cerumen of right ear   3. Elevated PSA     Plan:  1. Suspect combination of taking too much Klonopin and cerumen impaction; patient notes he felt much improved after holding dosage of Klonopin; ear lavage completed in office; follow-up if symptoms return; 3. Refer to  urology for further evaluation;    No follow-ups on file.  Orders Placed This Encounter  Procedures  . Ambulatory referral to Urology    Referral Priority:   Routine    Referral Type:   Consultation    Referral Reason:   Specialty Services Required    Requested Specialty:   Urology    Number of Visits Requested:   1    Requested Prescriptions    No prescriptions requested or ordered in this encounter

## 2017-07-04 ENCOUNTER — Ambulatory Visit (INDEPENDENT_AMBULATORY_CARE_PROVIDER_SITE_OTHER): Payer: Medicare Other | Admitting: Internal Medicine

## 2017-07-04 ENCOUNTER — Ambulatory Visit (INDEPENDENT_AMBULATORY_CARE_PROVIDER_SITE_OTHER)
Admission: RE | Admit: 2017-07-04 | Discharge: 2017-07-04 | Disposition: A | Discharge status: Home / Self Care | Payer: Medicare Other | Source: Ambulatory Visit | Attending: Internal Medicine | Admitting: Internal Medicine

## 2017-07-04 ENCOUNTER — Encounter: Payer: Self-pay | Admitting: Internal Medicine

## 2017-07-04 DIAGNOSIS — C154 Malignant neoplasm of middle third of esophagus: Secondary | ICD-10-CM | POA: Diagnosis not present

## 2017-07-04 DIAGNOSIS — J441 Chronic obstructive pulmonary disease with (acute) exacerbation: Secondary | ICD-10-CM | POA: Diagnosis not present

## 2017-07-04 DIAGNOSIS — J209 Acute bronchitis, unspecified: Secondary | ICD-10-CM | POA: Insufficient documentation

## 2017-07-04 DIAGNOSIS — I2510 Atherosclerotic heart disease of native coronary artery without angina pectoris: Secondary | ICD-10-CM

## 2017-07-04 DIAGNOSIS — R05 Cough: Secondary | ICD-10-CM | POA: Diagnosis not present

## 2017-07-04 MED ORDER — AZITHROMYCIN 250 MG PO TABS: ORAL_TABLET | ORAL | 0 refills | Status: DC

## 2017-07-04 MED ORDER — PROMETHAZINE-CODEINE 6.25-10 MG/5ML PO SYRP: 5.0000 mL | ORAL_SOLUTION | ORAL | 0 refills | Status: DC | PRN

## 2017-07-04 MED ORDER — METHYLPREDNISOLONE ACETATE 80 MG/ML IJ SUSP
80.0000 mg | Freq: Once | INTRAMUSCULAR | Status: AC
  Administered 2017-07-04: 80 mg via INTRAMUSCULAR

## 2017-07-04 NOTE — Assessment & Plan Note (Signed)
CXR Prom-cod syr Zpac

## 2017-07-04 NOTE — Progress Notes (Signed)
Subjective:  Patient ID: Aaron [name], male    DOB: Aug 19, 1935  Age: 82 y.o. MRN: [id number]  CC: No chief complaint on file.   HPI Aaron [name] presents for cough and wheezing x 1 week. No SOB. No fever, chills  Outpatient Medications Prior to Visit  Medication Sig Dispense Refill  . aspirin 81 MG EC tablet Take 81 mg by mouth daily.      . CHOLECALCIFEROL PO Take 1,000 Units by mouth daily.    . clonazePAM (KLONOPIN) 1 MG tablet Take 0.5-1 tablets (0.5-1 mg total) by mouth 2 (two) times daily as needed. for anxiety 180 tablet 1  . clotrimazole-betamethasone (LOTRISONE) cream Apply topically 2 (two) times daily. 15 g 2  . Dextromethorphan-guaiFENesin (MUCUS RELIEF DM COUGH) 20-400 MG TABS Take by mouth.    . levothyroxine (SYNTHROID, LEVOTHROID) 125 MCG tablet Take 1 tablet (125 mcg total) by mouth daily. 90 tablet 3  . loratadine (CLARITIN) 10 MG tablet Take 10 mg by mouth daily.     Marland Kitchen omeprazole (PRILOSEC) 40 MG capsule Take 1 capsule (40 mg total) by mouth daily. 90 capsule 3  . simvastatin (ZOCOR) 20 MG tablet Take 1 tablet (20 mg total) by mouth daily. 90 tablet 3   No facility-administered medications prior to visit.     ROS Review of Systems  Constitutional: Negative for appetite change, fatigue and unexpected weight change.  HENT: Negative for congestion, nosebleeds, sneezing, sore throat and trouble swallowing.   Eyes: Negative for itching and visual disturbance.  Respiratory: Positive for cough and wheezing. Negative for shortness of breath.   Cardiovascular: Negative for chest pain, palpitations and leg swelling.  Gastrointestinal: Negative for abdominal distention, blood in stool, diarrhea and nausea.  Genitourinary: Negative for frequency and hematuria.  Musculoskeletal: Negative for back pain, gait problem, joint swelling and neck pain.  Skin: Negative for rash.  Neurological: Negative for dizziness, tremors, speech difficulty and weakness.    Psychiatric/Behavioral: Negative for agitation, dysphoric mood and sleep disturbance. The patient is not nervous/anxious.     Objective:  BP 112/62 (BP Location: Right Arm, Patient Position: Sitting, Cuff Size: Large)   Pulse 83   Temp 98.6 F (37 C) (Oral)   Ht 6\' 3"  (1.905 m)   Wt 205 lb (93 kg)   SpO2 97%   BMI 25.62 kg/m   BP Readings from Last 3 Encounters:  07/04/17 112/62  06/19/17 110/70  05/02/17 117/62    Wt Readings from Last 3 Encounters:  07/04/17 205 lb (93 kg)  06/19/17 209 lb 1.9 oz (94.9 kg)  05/02/17 208 lb 8 oz (94.6 kg)    Physical Exam  Constitutional: He is oriented to person, place, and time. He appears well-developed. No distress.  NAD  HENT:  Mouth/Throat: Oropharynx is clear and moist.  Eyes: Pupils are equal, round, and reactive to light. Conjunctivae are normal.  Neck: Normal range of motion. No JVD present. No thyromegaly present.  Cardiovascular: Normal rate, regular rhythm, normal heart sounds and intact distal pulses. Exam reveals no gallop and no friction rub.  No murmur heard. Pulmonary/Chest: Effort normal and breath sounds normal. No respiratory distress. He has no wheezes. He has no rales. He exhibits no tenderness.  Abdominal: Soft. Bowel sounds are normal. He exhibits no distension and no mass. There is no tenderness. There is no rebound and no guarding.  Musculoskeletal: Normal range of motion. He exhibits no edema or tenderness.  Lymphadenopathy:    He has no cervical adenopathy.  Neurological: He is alert and oriented to person, place, and time. He has normal reflexes. No cranial nerve deficit. He exhibits normal muscle tone. He displays a negative Romberg sign. Coordination and gait normal.  Skin: Skin is warm and dry. No rash noted.  Psychiatric: He has a normal mood and affect. His behavior is normal. Judgment and thought content normal.  hard cough  Lab Results  Component Value Date   WBC 3.9 (L) 04/04/2017   HGB 14.5  04/04/2017   HCT 44.1 04/04/2017   PLT 178.0 04/04/2017   GLUCOSE 108 (H) 04/04/2017   CHOL 153 08/18/2015   TRIG 150 (H) 08/18/2015   HDL 54 08/18/2015   LDLCALC 69 08/18/2015   ALT 13 11/29/2016   AST 25 11/29/2016   NA 137 04/04/2017   K 4.3 04/04/2017   CL 100 04/04/2017   CREATININE 1.45 04/04/2017   BUN 18 04/04/2017   CO2 29 04/04/2017   TSH 1.55 06/04/2016   PSA 13.12 (H) 04/04/2017   INR 1.19 04/28/2009   HGBA1C 6.8 (H) 04/04/2017    No results found.  Assessment & Plan:   There are no diagnoses linked to this encounter. I am having Britt Bolognese maintain his aspirin, loratadine, CHOLECALCIFEROL PO, clotrimazole-betamethasone, levothyroxine, omeprazole, simvastatin, clonazePAM, and Dextromethorphan-guaiFENesin.  No orders of the defined types were placed in this encounter.    Follow-up: No follow-ups on file.  Walker Kehr, MD

## 2017-07-04 NOTE — Assessment & Plan Note (Signed)
Depo-medrol IM 80 mg 

## 2017-07-04 NOTE — Addendum Note (Signed)
Addended by: Karren Cobble on: 07/04/2017 10:27 AM   Modules accepted: Orders

## 2017-07-04 NOTE — Assessment & Plan Note (Signed)
CXR

## 2017-08-29 DIAGNOSIS — R972 Elevated prostate specific antigen [PSA]: Secondary | ICD-10-CM | POA: Diagnosis not present

## 2017-09-03 ENCOUNTER — Other Ambulatory Visit: Payer: Self-pay | Admitting: *Deleted

## 2017-09-03 NOTE — Telephone Encounter (Signed)
LOV w/you: 07/04/17 Last filled: 07/04/17 # 300 ml  Ok to Rf?

## 2017-09-04 MED ORDER — PROMETHAZINE-CODEINE 6.25-10 MG/5ML PO SYRP: 5.0000 mL | ORAL_SOLUTION | ORAL | 0 refills | Status: DC | PRN

## 2017-10-24 ENCOUNTER — Encounter: Payer: Self-pay | Admitting: Internal Medicine

## 2017-10-24 ENCOUNTER — Other Ambulatory Visit (INDEPENDENT_AMBULATORY_CARE_PROVIDER_SITE_OTHER): Payer: Medicare Other

## 2017-10-24 ENCOUNTER — Ambulatory Visit (INDEPENDENT_AMBULATORY_CARE_PROVIDER_SITE_OTHER): Payer: Medicare Other | Admitting: Internal Medicine

## 2017-10-24 DIAGNOSIS — E785 Hyperlipidemia, unspecified: Secondary | ICD-10-CM

## 2017-10-24 DIAGNOSIS — R972 Elevated prostate specific antigen [PSA]: Secondary | ICD-10-CM | POA: Diagnosis not present

## 2017-10-24 DIAGNOSIS — C154 Malignant neoplasm of middle third of esophagus: Secondary | ICD-10-CM

## 2017-10-24 DIAGNOSIS — F419 Anxiety disorder, unspecified: Secondary | ICD-10-CM | POA: Diagnosis not present

## 2017-10-24 DIAGNOSIS — I2510 Atherosclerotic heart disease of native coronary artery without angina pectoris: Secondary | ICD-10-CM

## 2017-10-24 DIAGNOSIS — I10 Essential (primary) hypertension: Secondary | ICD-10-CM

## 2017-10-24 DIAGNOSIS — E039 Hypothyroidism, unspecified: Secondary | ICD-10-CM

## 2017-10-24 DIAGNOSIS — R7309 Other abnormal glucose: Secondary | ICD-10-CM | POA: Diagnosis not present

## 2017-10-24 DIAGNOSIS — D500 Iron deficiency anemia secondary to blood loss (chronic): Secondary | ICD-10-CM | POA: Diagnosis not present

## 2017-10-24 LAB — HEMOGLOBIN A1C: Hgb A1c MFr Bld: 6.9 % — ABNORMAL HIGH (ref 4.6–6.5)

## 2017-10-24 LAB — BASIC METABOLIC PANEL
BUN: 17 mg/dL (ref 6–23)
CHLORIDE: 100 meq/L (ref 96–112)
CO2: 31 mEq/L (ref 19–32)
Calcium: 9.8 mg/dL (ref 8.4–10.5)
Creatinine, Ser: 1.4 mg/dL (ref 0.40–1.50)
GFR: 62.74 mL/min (ref >60.00)
GLUCOSE: 91 mg/dL (ref 70–99)
POTASSIUM: 3.8 meq/L (ref 3.5–5.1)
SODIUM: 137 meq/L (ref 135–145)

## 2017-10-24 LAB — TSH: TSH: 3.31 u[IU]/mL (ref 0.35–4.50)

## 2017-10-24 NOTE — Assessment & Plan Note (Signed)
Labs

## 2017-10-24 NOTE — Assessment & Plan Note (Signed)
On ASA, Simvastatin 

## 2017-10-24 NOTE — Assessment & Plan Note (Signed)
F/u w/dr Fuller Plan No sx's

## 2017-10-24 NOTE — Assessment & Plan Note (Signed)
Clonazepam prn  Potential benefits of a long term benzodiazepines  use as well as potential risks  and complications were explained to the patient and were aknowledged.  

## 2017-10-24 NOTE — Progress Notes (Signed)
Subjective:  Patient ID: Aaron [name], male    DOB: Aug 24, 1935  Age: 82 y.o. MRN: [id number]  CC: No chief complaint on file.   HPI Aaron [name] presents for anxiety, hypothyroidism, dyslipidemia f/u  Outpatient Medications Prior to Visit  Medication Sig Dispense Refill  . aspirin 81 MG EC tablet Take 81 mg by mouth daily.      Marland Kitchen azithromycin (ZITHROMAX Z-PAK) 250 MG tablet As directed 6 each 0  . CHOLECALCIFEROL PO Take 1,000 Units by mouth daily.    . clonazePAM (KLONOPIN) 1 MG tablet Take 0.5-1 tablets (0.5-1 mg total) by mouth 2 (two) times daily as needed. for anxiety 180 tablet 1  . clotrimazole-betamethasone (LOTRISONE) cream Apply topically 2 (two) times daily. 15 g 2  . Dextromethorphan-guaiFENesin (MUCUS RELIEF DM COUGH) 20-400 MG TABS Take by mouth.    . levothyroxine (SYNTHROID, LEVOTHROID) 125 MCG tablet Take 1 tablet (125 mcg total) by mouth daily. 90 tablet 3  . loratadine (CLARITIN) 10 MG tablet Take 10 mg by mouth daily.     Marland Kitchen omeprazole (PRILOSEC) 40 MG capsule Take 1 capsule (40 mg total) by mouth daily. 90 capsule 3  . promethazine-codeine (PHENERGAN WITH CODEINE) 6.25-10 MG/5ML syrup Take 5 mLs by mouth every 4 (four) hours as needed. 300 mL 0  . simvastatin (ZOCOR) 20 MG tablet Take 1 tablet (20 mg total) by mouth daily. 90 tablet 3   No facility-administered medications prior to visit.     ROS: Review of Systems  Constitutional: Negative for appetite change, fatigue and unexpected weight change.  HENT: Negative for congestion, nosebleeds, sneezing, sore throat and trouble swallowing.   Eyes: Negative for itching and visual disturbance.  Respiratory: Negative for cough.   Cardiovascular: Negative for chest pain, palpitations and leg swelling.  Gastrointestinal: Negative for abdominal distention, blood in stool, diarrhea and nausea.  Genitourinary: Negative for frequency and hematuria.  Musculoskeletal: Positive for arthralgias. Negative for back pain,  gait problem, joint swelling and neck pain.  Skin: Negative for rash.  Neurological: Negative for dizziness, tremors, speech difficulty and weakness.  Psychiatric/Behavioral: Negative for agitation, dysphoric mood, sleep disturbance and suicidal ideas. The patient is nervous/anxious.     Objective:  BP 106/70 (BP Location: Left Arm, Patient Position: Sitting, Cuff Size: Normal)   Pulse 79   Temp 97.7 F (36.5 C) (Oral)   Ht 6\' 3"  (1.905 m)   Wt 202 lb (91.6 kg)   SpO2 96%   BMI 25.25 kg/m   BP Readings from Last 3 Encounters:  10/24/17 106/70  07/04/17 112/62  06/19/17 110/70    Wt Readings from Last 3 Encounters:  10/24/17 202 lb (91.6 kg)  07/04/17 205 lb (93 kg)  06/19/17 209 lb 1.9 oz (94.9 kg)    Physical Exam  Constitutional: He is oriented to person, place, and time. He appears well-developed. No distress.  NAD  HENT:  Mouth/Throat: Oropharynx is clear and moist.  Eyes: Pupils are equal, round, and reactive to light. Conjunctivae are normal.  Neck: Normal range of motion. No JVD present. No thyromegaly present.  Cardiovascular: Normal rate, regular rhythm, normal heart sounds and intact distal pulses. Exam reveals no gallop and no friction rub.  No murmur heard. Pulmonary/Chest: Effort normal and breath sounds normal. No respiratory distress. He has no wheezes. He has no rales. He exhibits no tenderness.  Abdominal: Soft. Bowel sounds are normal. He exhibits no distension and no mass. There is no tenderness. There is no rebound and no guarding.  Musculoskeletal: Normal range of motion. He exhibits no edema or tenderness.  Lymphadenopathy:    He has no cervical adenopathy.  Neurological: He is alert and oriented to person, place, and time. He has normal reflexes. No cranial nerve deficit. He exhibits normal muscle tone. He displays a negative Romberg sign. Coordination and gait normal.  Skin: Skin is warm and dry. No rash noted.  Psychiatric: He has a normal mood  and affect. His behavior is normal. Judgment and thought content normal.    Lab Results  Component Value Date   WBC 3.9 (L) 04/04/2017   HGB 14.5 04/04/2017   HCT 44.1 04/04/2017   PLT 178.0 04/04/2017   GLUCOSE 108 (H) 04/04/2017   CHOL 153 08/18/2015   TRIG 150 (H) 08/18/2015   HDL 54 08/18/2015   LDLCALC 69 08/18/2015   ALT 13 11/29/2016   AST 25 11/29/2016   NA 137 04/04/2017   K 4.3 04/04/2017   CL 100 04/04/2017   CREATININE 1.45 04/04/2017   BUN 18 04/04/2017   CO2 29 04/04/2017   TSH 1.55 06/04/2016   PSA 13.12 (H) 04/04/2017   INR 1.19 04/28/2009   HGBA1C 6.8 (H) 04/04/2017    Dg Chest 2 View  Result Date: 07/04/2017 CLINICAL DATA:  Cough and congestion with wheezing EXAM: CHEST - 2 VIEW COMPARISON:  September 24, 2011 FINDINGS: There is slight bibasilar atelectasis. No edema or consolidation. Heart size and pulmonary vascularity are normal. No adenopathy. No bone lesions. IMPRESSION: Slight bibasilar atelectasis.  No evident edema or consolidation. Electronically Signed   By: Lowella Grip III M.D.   On: 07/04/2017 14:05    Assessment & Plan:   There are no diagnoses linked to this encounter.   No orders of the defined types were placed in this encounter.    Follow-up: No follow-ups on file.  Walker Kehr, MD

## 2017-10-24 NOTE — Assessment & Plan Note (Signed)
Simvastatin 

## 2017-10-24 NOTE — Assessment & Plan Note (Signed)
labs

## 2017-10-24 NOTE — Assessment & Plan Note (Signed)
Monitor PSA 

## 2017-11-14 ENCOUNTER — Ambulatory Visit (INDEPENDENT_AMBULATORY_CARE_PROVIDER_SITE_OTHER): Payer: Medicare Other | Admitting: Gastroenterology

## 2017-11-14 ENCOUNTER — Encounter: Payer: Self-pay | Admitting: Gastroenterology

## 2017-11-14 VITALS — BP 90/60 | HR 64 | Ht 75.0 in | Wt 201.8 lb

## 2017-11-14 DIAGNOSIS — R1310 Dysphagia, unspecified: Secondary | ICD-10-CM

## 2017-11-14 NOTE — Progress Notes (Signed)
History of Present Illness: This is a 82 year old male self referred for the evaluation of dysphagia.  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  For the past several months he notices mild difficulty swallowing liquids and solids.  He feels there is a slight delay from when he initiates a swallow to the liquid or food passing.  His symptoms have not progressed in severity.  He also notes frequent belching.  No other gastrointestinal complaints.  His most recent EGD was in July 2013 which showed a proximal esophageal stricture which was dilated. Denies weight loss, abdominal pain, constipation, diarrhea, change in stool caliber, melena, hematochezia, nausea, vomiting, reflux symptoms, chest pain.   Allergies  Allergen Reactions  . Amoxicillin     REACTION: rash  . Cefuroxime Axetil     REACTION: nit feeling well  . Fluconazole     REACTION: shaking  . Escitalopram Other (See Comments)    Nightmares, numbness in tongue, dizziness   Outpatient Medications Prior to Visit  Medication Sig Dispense Refill  . aspirin 81 MG EC tablet Take 81 mg by mouth daily.      . CHOLECALCIFEROL PO Take 1,000 Units by mouth daily.    . clonazePAM (KLONOPIN) 1 MG tablet Take 0.5-1 tablets (0.5-1 mg total) by mouth 2 (two) times daily as needed. for anxiety 180 tablet 1  . clotrimazole-betamethasone (LOTRISONE) cream Apply topically 2 (two) times daily. 15 g 2  . Dextromethorphan-guaiFENesin (MUCUS RELIEF DM COUGH) 20-400 MG TABS Take by mouth.    . levothyroxine (SYNTHROID, LEVOTHROID) 125 MCG tablet Take 1 tablet (125 mcg total) by mouth daily. 90 tablet 3  . omeprazole (PRILOSEC) 40 MG capsule Take 1 capsule (40 mg total) by mouth daily. 90 capsule 3  . promethazine-codeine (PHENERGAN WITH CODEINE) 6.25-10 MG/5ML syrup Take 5 mLs by mouth every 4 (four) hours as needed. 300 mL 0  . psyllium (METAMUCIL) 58.6 % packet Take 1 packet by mouth 2 (two) times  daily.    . simvastatin (ZOCOR) 20 MG tablet Take 1 tablet (20 mg total) by mouth daily. 90 tablet 3  . azithromycin (ZITHROMAX Z-PAK) 250 MG tablet As directed 6 each 0  . loratadine (CLARITIN) 10 MG tablet Take 10 mg by mouth daily.      No facility-administered medications prior to visit.    Past Medical History:  Diagnosis Date  . Allergic rhinitis   . Arrhythmia   . CAD (coronary artery disease)    mild  . COPD (chronic obstructive pulmonary disease) ([place])   . Diverticulosis of colon   . Elevated glucose   . Esophageal cancer ([place]) 2011   squamous cell ca - had J-tube for some time, now removed   . Esophageal stricture   . Gastritis   . GERD (gastroesophageal reflux disease)   . Hemorrhoids   . Hiatal hernia   . History of chemotherapy 05/03/09-06/16/09   Taxol/carboplatin  . History of nuclear stress test 07/2008   exercise; mild-mod perfusion dfect in basal inf/mid inf/apical inf regions; EKG negative for ischemia  . History of radiation therapy 05/03/09-06/16/09   squamous cell ca of esophagus  . HTN (hypertension)    mild diastolic dysfunction   . Hyperlipidemia    [name]  . Hypothyroid    s/p radiotherapy   Past Surgical History:  Procedure Laterality Date  . CARDIAC CATHETERIZATION  08/2008   mild nonobstructive CAD with 30% LAD  stenosis prox & 20% OM1 narrowing   . CREATION OF CUTANEOUS STOMA  2011  . ESOPHAGOGASTRODUODENOSCOPY  10/03/2011   Procedure: ESOPHAGOGASTRODUODENOSCOPY (EGD);  Surgeon: [name], MD,FACG;  Location: [name];  Service: Endoscopy;  Laterality: N/A;  no fluro needed  . Lower Back Cyst Removed     [facility]  . [name] DILATION  10/03/2011   Procedure: [name] DILATION;  Surgeon: [name], MD,FACG;  Location: [facility];  Service: Endoscopy;  Laterality: N/A;  . TONSILLECTOMY     age 8  . TRANSTHORACIC ECHOCARDIOGRAM  06/2009   EF=>55%; trace MR/TR   Social History   Socioeconomic History  . Marital status: Married     Spouse name: Not on file  . Number of children: 3  . Years of education: 12th  . Highest education level: Not on file  Occupational History  . Occupation: Retired, [place] [employer]    Employer: RETIRED    Comment: Supervisor  Social Needs  . Financial resource strain: Not on file  . Food insecurity:    Worry: Not on file    Inability: Not on file  . Transportation needs:    Medical: Not on file    Non-medical: Not on file  Tobacco Use  . Smoking status: Former Smoker    Packs/day: 1.00    Years: 40.00    Pack years: 40.00    Last attempt to quit: 01/16/2001    Years since quitting: 16.8  . Smokeless tobacco: Never Used  . Tobacco comment: Quit 2002  Substance and Sexual Activity  . Alcohol use: Yes    Alcohol/week: 0.0 standard drinks    Comment: occasional drink 2-3 a month  . Drug use: No    Comment: quit smoking 2002  . Sexual activity: Yes  Lifestyle  . Physical activity:    Days per week: Not on file    Minutes per session: Not on file  . Stress: Not on file  Relationships  . Social connections:    Talks on phone: Not on file    Gets together: Not on file    Attends religious service: Not on file    Active member of club or organization: Not on file    Attends meetings of clubs or organizations: Not on file    Relationship status: Not on file  Other Topics Concern  . Not on file  Social History Narrative   Daily Caffeine - 1      Family History  Problem Relation Age of Onset  . Prostate cancer Father        3's  . Diabetes Father   . Stroke Mother        aneurism  . Stroke Maternal Grandfather 35  . Prostate cancer Brother 31  . Prostate cancer Brother 65  . Colon cancer Sister   . Coronary artery disease Neg Hx   . Hypertension Neg Hx         Review of Systems: Pertinent positive and negative review of systems were noted in the above HPI section. All other review of systems were otherwise negative.    Physical Exam: General:  Well developed, well nourished, no acute distress Head: Normocephalic and atraumatic Eyes:  sclerae anicteric, EOMI Ears: Normal auditory acuity Mouth: No deformity or lesions Neck: Supple, no masses or thyromegaly Lungs: Clear throughout to auscultation Heart: Regular rate and rhythm; no murmurs, rubs or bruits Abdomen: Soft, non tender and non distended. No masses, hepatosplenomegaly or hernias  noted. Normal Bowel sounds Rectal: Not done Musculoskeletal: Symmetrical with no gross deformities  Skin: No lesions on visible extremities Pulses:  Normal pulses noted Extremities: No clubbing, cyanosis, edema or deformities noted Neurological: Alert oriented x 4, grossly nonfocal Cervical Nodes:  No significant cervical adenopathy Inguinal Nodes: No significant inguinal adenopathy Psychological:  Alert and cooperative. Normal mood and affect  Assessment and Recommendations:  1. Dysphagia. History of an esophageal stricture. History of squamous cell carcinoma of the esophagus felt to be in remission.  Suspected oropharyngeal. R/O esophageal stricture.  Schedule EGD. The risks (including bleeding, perforation, infection, missed lesions, medication reactions and possible hospitalization or surgery if complications occur), benefits, and alternatives to endoscopy with possible biopsy and possible dilation were discussed with the patient and they consent to proceed.

## 2017-11-14 NOTE — Patient Instructions (Signed)
You have been scheduled for an endoscopy. Please follow written instructions given to you at your visit today. If you use inhalers (even only as needed), please bring them with you on the day of your procedure. Your physician has requested that you go to www.startemmi.com and enter the access code given to you at your visit today. This web site gives a general overview about your procedure. However, you should still follow specific instructions given to you by our office regarding your preparation for the procedure.  Normal BMI (Body Mass Index- based on height and weight) is between 23 and 30. Your BMI today is Body mass index is 25.22 kg/m. Marland Kitchen Please consider follow up  regarding your BMI with your Primary Care Provider.  Thank you for choosing me and [facility].  Pricilla Riffle. Dagoberto Ligas., MD., Marval Regal

## 2017-11-28 ENCOUNTER — Ambulatory Visit (INDEPENDENT_AMBULATORY_CARE_PROVIDER_SITE_OTHER): Payer: Medicare Other | Admitting: Cardiovascular Disease

## 2017-11-28 ENCOUNTER — Encounter: Payer: Self-pay | Admitting: Cardiovascular Disease

## 2017-11-28 ENCOUNTER — Encounter

## 2017-11-28 VITALS — BP 107/68 | HR 76 | Ht 75.0 in | Wt 202.2 lb

## 2017-11-28 DIAGNOSIS — I358 Other nonrheumatic aortic valve disorders: Secondary | ICD-10-CM

## 2017-11-28 DIAGNOSIS — Z8501 Personal history of malignant neoplasm of esophagus: Secondary | ICD-10-CM

## 2017-11-28 DIAGNOSIS — E039 Hypothyroidism, unspecified: Secondary | ICD-10-CM

## 2017-11-28 DIAGNOSIS — E785 Hyperlipidemia, unspecified: Secondary | ICD-10-CM

## 2017-11-28 DIAGNOSIS — I2510 Atherosclerotic heart disease of native coronary artery without angina pectoris: Secondary | ICD-10-CM | POA: Diagnosis not present

## 2017-11-28 NOTE — Progress Notes (Signed)
Patient ID: Aaron [name], male   DOB: October 02, 1935, 82 y.o.   MRN: [id number]     HPI: [name] is a 82 y.o. male presents to the office today for a 15 month cardiology evaluation.  [name] has a history of mild CAD with 30% ostial LAD stenosis and 20% OM1 narrowing noted at cardiac catheterization in June 2010. Additional problems include hypertension with mild diastolic dysfunction, hyperlipidemia, and history of esophageal cancer, status post chemotherapy radiation therapy, history of hypothyroidism for which he is on thyroid replacement therapy. Over the past year, Mr. Cone has continued to do well. 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  He denies any change in exercise tolerance.  He denies chest pressure.  He denies palpitations.  He denies presyncope or syncope.  He denies edema.  He does have a history of diverticulosis and mild COPD.  He tells me he recently saw [name] who felt that he was stable with reference to his remote esophageal CA and that there was no need to do any repeat endoscopy.   He underwent a 2-D echo Doppler study in October 2016 which showed an EF of 55-60%.  Mild to moderate aortic insufficiency, mild TR and trivial PR.  I last saw him June 2018 at which time he was remaining stable.  In the past year, he has continued to do well.  He continues to work 4 days/week, 6 hours/day [employer].  He stays active.  He denies recurrent chest pain.  He sees [name] in follow-up of his esophageal CA and has endoscopy scheduled follow-up assessment on December 31, 2017.  Continues to be on levothyroxine for hypothyroidism on simvastatin for anemia.  He presents for reevaluation.  Past Medical History:  Diagnosis Date  . Allergic rhinitis   . Arrhythmia   . CAD (coronary artery disease)    mild  . COPD (chronic obstructive pulmonary disease) ([place])   . Diverticulosis of colon   .  Elevated glucose   . 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 Esophageal stricture   . Gastritis   . GERD (gastroesophageal reflux disease)   . Hemorrhoids   . Hiatal hernia   . History of chemotherapy 05/03/09-06/16/09   Taxol/carboplatin  . History of nuclear stress test 07/2008   exercise; mild-mod perfusion dfect in basal inf/mid inf/apical inf regions; EKG negative for ischemia  . History of radiation therapy 05/03/09-06/16/09   squamous cell ca of esophagus  . HTN (hypertension)    mild diastolic dysfunction   . Hyperlipidemia    [name]  . Hypothyroid    s/p radiotherapy    Past Surgical History:  Procedure Laterality Date  . CARDIAC CATHETERIZATION  08/2008   mild nonobstructive CAD with 30% LAD stenosis prox & 20% OM1 narrowing   . CREATION OF CUTANEOUS STOMA  2011  . ESOPHAGOGASTRODUODENOSCOPY  10/03/2011   Procedure: ESOPHAGOGASTRODUODENOSCOPY (EGD);  Surgeon: [name], MD,FACG;  Location: [name];  Service: Endoscopy;  Laterality: N/A;  no fluro needed  . Lower Back Cyst Removed     [facility]  . [name] DILATION  10/03/2011   Procedure: [name] DILATION;  Surgeon: [name], MD,FACG;  Location: [facility];  Service: Endoscopy;  Laterality: N/A;  . TONSILLECTOMY     age 84  . TRANSTHORACIC ECHOCARDIOGRAM  06/2009   EF=>55%;  trace MR/TR    Allergies  Allergen Reactions  . Amoxicillin     REACTION: rash  . Cefuroxime Axetil     REACTION: nit feeling well  . Fluconazole     REACTION: shaking  . Escitalopram Other (See Comments)    Nightmares, numbness in tongue, dizziness    Current Outpatient Medications  Medication Sig Dispense Refill  . aspirin 81 MG EC tablet Take 81 mg by mouth daily.      . CHOLECALCIFEROL PO Take 1,000 Units by mouth daily.    . clonazePAM (KLONOPIN) 1 MG tablet Take 0.5-1 tablets (0.5-1 mg total) by mouth 2 (two) times daily as needed. for anxiety 180 tablet 1  .  clotrimazole-betamethasone (LOTRISONE) cream Apply topically 2 (two) times daily. 15 g 2  . Dextromethorphan-guaiFENesin (MUCUS RELIEF DM COUGH) 20-400 MG TABS Take by mouth.    . levothyroxine (SYNTHROID, LEVOTHROID) 125 MCG tablet Take 1 tablet (125 mcg total) by mouth daily. 90 tablet 3  . omeprazole (PRILOSEC) 40 MG capsule Take 1 capsule (40 mg total) by mouth daily. 90 capsule 3  . promethazine-codeine (PHENERGAN WITH CODEINE) 6.25-10 MG/5ML syrup Take 5 mLs by mouth every 4 (four) hours as needed. 300 mL 0  . psyllium (METAMUCIL) 58.6 % packet Take 1 packet by mouth 2 (two) times daily.    . simvastatin (ZOCOR) 20 MG tablet Take 1 tablet (20 mg total) by mouth daily. 90 tablet 3   No current facility-administered medications for this visit.     Social History   Socioeconomic History  . Marital status: Married    Spouse name: Not on file  . Number of children: 3  . Years of education: 12th  . Highest education level: Not on file  Occupational History  . Occupation: Retired, [place] [employer]    Employer: RETIRED    Comment: Supervisor  Social Needs  . Financial resource strain: Not on file  . Food insecurity:    Worry: Not on file    Inability: Not on file  . Transportation needs:    Medical: Not on file    Non-medical: Not on file  Tobacco Use  . Smoking status: Former Smoker    Packs/day: 1.00    Years: 40.00    Pack years: 40.00    Last attempt to quit: 01/16/2001    Years since quitting: 16.8  . Smokeless tobacco: Never Used  . Tobacco comment: Quit 2002  Substance and Sexual Activity  . Alcohol use: Yes    Alcohol/week: 0.0 standard drinks    Comment: occasional drink 2-3 a month  . Drug use: No    Comment: quit smoking 2002  . Sexual activity: Yes  Lifestyle  . Physical activity:    Days per week: Not on file    Minutes per session: Not on file  . Stress: Not on file  Relationships  . Social connections:    Talks on phone: Not on file    Gets  together: Not on file    Attends religious service: Not on file    Active member of club or organization: Not on file    Attends meetings of clubs or organizations: Not on file    Relationship status: Not on file  . Intimate partner violence:    Fear of current or ex partner: Not on file    Emotionally abused: Not on file    Physically abused: Not on file    Forced sexual activity: Not on file  Other Topics Concern  . Not on file  Social History Narrative   Daily Caffeine - 1       Family History  Problem Relation Age of Onset  . Prostate cancer Father        55's  . Diabetes Father   . Stroke Mother        aneurism  . Stroke Maternal Grandfather 35  . Prostate cancer Brother 61  . Prostate cancer Brother 71  . Colon cancer Sister   . Coronary artery disease Neg Hx   . Hypertension Neg Hx     ROS General: Negative; No fevers, chills, or night sweats;  HEENT: Negative; No changes in vision or hearing, sinus congestion, difficulty swallowing Pulmonary: Negative; No cough, wheezing, shortness of breath, hemoptysis Cardiovascular: Negative; No chest pain, presyncope, syncope, palpitations.  No claudication GI: Remote history of esophageal cancer, status post chemotherapy and radiation treatment GU: Negative; No dysuria, hematuria, or difficulty voiding Musculoskeletal: Negative; no myalgias, joint pain, or weakness Hematologic/Oncology: Negative; no easy bruising, bleeding Endocrine: Positive for hypothyroidism on Synthroid replacement Neuro: Negative; no changes in balance, headaches Skin: Negative; No rashes or skin lesions Psychiatric: Negative; No behavioral problems, depression Sleep: Negative; No snoring, daytime sleepiness, hypersomnolence, bruxism, restless legs, hypnogognic hallucinations, no cataplexy Other comprehensive 14 point system review is negative.  PE BP 107/68   Pulse 76   Ht 6' 3"  (1.905 m)   Wt 202 lb 3.2 oz (91.7 kg)   BMI 25.27 kg/m     Repeat blood pressure by me was 105/70  Wt Readings from Last 3 Encounters:  11/28/17 202 lb 3.2 oz (91.7 kg)  11/14/17 201 lb 12.8 oz (91.5 kg)  10/24/17 202 lb (91.6 kg)   General: Alert, oriented, no distress.  Skin: normal turgor, no rashes, warm and dry HEENT: Normocephalic, atraumatic. Pupils equal round and reactive to light; sclera anicteric; extraocular muscles intact; Nose without nasal septal hypertrophy Mouth/Parynx benign; Mallinpatti scale 3 Neck: No JVD, no carotid bruits; normal carotid upstroke Lungs: clear to ausculatation and percussion; no wheezing or rales Chest wall: without tenderness to palpitation Heart: PMI not displaced, RRR, s1 s2 normal, 9-0/2 systolic murmur, no diastolic murmur, no rubs, gallops, thrills, or heaves Abdomen: soft, nontender; no hepatosplenomehaly, BS+; abdominal aorta nontender and not dilated by palpation. Back: no CVA tenderness Pulses 2+ Musculoskeletal: full range of motion, normal strength, no joint deformities Extremities: no clubbing cyanosis or edema, Homan's sign negative  Neurologic: grossly nonfocal; Cranial nerves grossly wnl Psychologic: Normal mood and affect   ECG (independently read by me): Normal sinus rhythm at 76 bpm.  One isolated PVC.  No ST segment changes.  Normal intervals.  June 2019 ECG (independently read by me): Normal sinus rhythm at 79 bpm.  No ectopy.  No ST segment changes.  May 2017 ECG (independently read by me): Normal sinus rhythm at 76 bpm.  PR interval 176 ms.  QTc interval 411 milliseconds.  No ST segment changes.  September 2016 ECG (independently read by me): Normal sinus rhythm at 87 bpm.  No ectopy.  Normal intervals.  No ST segment changes.  12/17/2013 ECG (independently read by me): Normal sinus rhythm at 65 beats per minute.  Normal intervals.  No ST segment changes.  Prior September 2014 ECG: Normal sinus rhythm at 60 beats per minute. Intervals are normal.  LABS:  BMP Latest Ref  Rng & Units 10/24/2017 04/04/2017 11/29/2016  Glucose 70 - 99 mg/dL 91 108(H) 111(H)  BUN 6 -  23 mg/dL 17 18 16   Creatinine 0.40 - 1.50 mg/dL 1.40 1.45 1.43  Sodium 135 - 145 mEq/L 137 137 139  Potassium 3.5 - 5.1 mEq/L 3.8 4.3 4.3  Chloride 96 - 112 mEq/L 100 100 101  CO2 19 - 32 mEq/L 31 29 31   Calcium 8.4 - 10.5 mg/dL 9.8 9.7 10.3   Hepatic Function Latest Ref Rng & Units 11/29/2016 08/18/2015 08/05/2014  Total Protein 6.0 - 8.3 g/dL 7.8 7.4 7.8  Albumin 3.5 - 5.2 g/dL 4.5 4.2 4.1  AST 0 - 37 U/L 25 28 24   ALT 0 - 53 U/L 13 14 13   Alk Phosphatase 39 - 117 U/L 77 67 82  Total Bilirubin 0.2 - 1.2 mg/dL 0.4 0.4 0.4  Bilirubin, Direct 0.0 - 0.3 mg/dL 0.1 - 0.1   CBC Latest Ref Rng & Units 04/04/2017 08/18/2015 02/03/2015  WBC 4.0 - 10.5 K/uL 3.9(L) 3.7(L) 4.4  Hemoglobin 13.0 - 17.0 g/dL 14.5 13.8 14.0  Hematocrit 39.0 - 52.0 % 44.1 42.6 42.7  Platelets 150.0 - 400.0 K/uL 178.0 217 197.0   Lab Results  Component Value Date   MCV 90.0 04/04/2017   MCV 89.1 08/18/2015   MCV 88.0 02/03/2015   Lab Results  Component Value Date   TSH 3.31 10/24/2017   Lab Results  Component Value Date   HGBA1C 6.9 (H) 10/24/2017   Lipid Panel     Component Value Date/Time   CHOL 153 08/18/2015 0803   TRIG 150 (H) 08/18/2015 0803   TRIG 120 02/26/2006 1420   HDL 54 08/18/2015 0803   CHOLHDL 2.8 08/18/2015 0803   VLDL 30 08/18/2015 0803   LDLCALC 69 08/18/2015 0803   IMPRESSION:  1. Coronary artery disease involving native coronary artery of native heart without angina pectoris   2. Dyslipidemia   3. History of esophageal cancer   4. Aortic heart murmur   5. Hypothyroidism, unspecified type     ASSESSMENT AND PLAN: Mr. Incorvaia is a 82 year old African-American male who has documented mild CAD by cardiac catheterization in June 2010 and has remained stable on medical therapy.  He specifically denies recurrent exertional chest pain or shortness of breath.  His ECG remains unchanged.  His blood  pressure today is stable.  His last echo Doppler study has shown normal systolic function with mild diastolic dysfunction, mild to moderate AR mild TR.  Last year his TSH was elevated and levothyroxine dose was increased by his primary physician.  Presently he continues to be on levothyroxine 125 mcg he has not had recent lipid studies obtained.  He continues to be on simvastatin milligrams.  Fasting lipid panel.  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  He is stable with reference to his esophageal CA history and will be undergoing follow-up endoscopy assessment next month.  Contact him regarding laboratory.  As long as he remains stable I will see him in 1 year for reevaluation or sooner if problems arise.  Time spent: 25 minutes  Troy Sine, MD, [facility]  11/28/2017 7:37 PM

## 2017-11-28 NOTE — Patient Instructions (Signed)
Medication Instructions:  Your physician recommends that you continue on your current medications as directed. Please refer to the Current Medication list given to you today.  Labwork: Please return for FASTING labs (Lipid)  Our in office lab hours are Monday-Friday 8:00-4:00, closed for lunch 12:45-1:45 pm.  No appointment needed  Follow-Up: Your physician wants you to follow-up in: 1 year with Dr. [name].  You will receive a reminder letter in the mail two months in advance. If you don't receive a letter, please call our office to schedule the follow-up appointment.   Any Other Special Instructions Will Be Listed Below (If Applicable).     If you need a refill on your cardiac medications before your next appointment, please call your pharmacy.

## 2017-12-05 DIAGNOSIS — E785 Hyperlipidemia, unspecified: Secondary | ICD-10-CM | POA: Diagnosis not present

## 2017-12-05 DIAGNOSIS — I2510 Atherosclerotic heart disease of native coronary artery without angina pectoris: Secondary | ICD-10-CM | POA: Diagnosis not present

## 2017-12-05 LAB — LIPID PANEL
Chol/HDL Ratio: 2.6 ratio (ref 0.0–5.0)
Cholesterol, Total: 150 mg/dL (ref 100–199)
HDL: 57 mg/dL (ref >39)
LDL Calculated: 74 mg/dL (ref 0–99)
Triglycerides: 93 mg/dL (ref 0–149)
VLDL CHOLESTEROL CAL: 19 mg/dL (ref 5–40)

## 2017-12-09 DIAGNOSIS — Z23 Encounter for immunization: Secondary | ICD-10-CM | POA: Diagnosis not present

## 2017-12-12 DIAGNOSIS — H40023 Open angle with borderline findings, high risk, bilateral: Secondary | ICD-10-CM | POA: Diagnosis not present

## 2017-12-31 ENCOUNTER — Encounter: Payer: Self-pay | Admitting: Gastroenterology

## 2017-12-31 ENCOUNTER — Ambulatory Visit (AMBULATORY_SURGERY_CENTER): Payer: Medicare Other | Admitting: Gastroenterology

## 2017-12-31 VITALS — BP 112/76 | HR 77 | Temp 97.1°F | Resp 17 | Ht 75.0 in | Wt 201.0 lb

## 2017-12-31 DIAGNOSIS — R1310 Dysphagia, unspecified: Secondary | ICD-10-CM | POA: Diagnosis not present

## 2017-12-31 DIAGNOSIS — Z8501 Personal history of malignant neoplasm of esophagus: Secondary | ICD-10-CM | POA: Diagnosis not present

## 2017-12-31 DIAGNOSIS — K219 Gastro-esophageal reflux disease without esophagitis: Secondary | ICD-10-CM | POA: Diagnosis not present

## 2017-12-31 DIAGNOSIS — K222 Esophageal obstruction: Secondary | ICD-10-CM | POA: Diagnosis not present

## 2017-12-31 DIAGNOSIS — J449 Chronic obstructive pulmonary disease, unspecified: Secondary | ICD-10-CM | POA: Diagnosis not present

## 2017-12-31 DIAGNOSIS — K209 Esophagitis, unspecified: Secondary | ICD-10-CM | POA: Diagnosis not present

## 2017-12-31 MED ORDER — SODIUM CHLORIDE 0.9 % IV SOLN: 500.0000 mL | Freq: Once | INTRAVENOUS | Status: DC

## 2017-12-31 NOTE — Patient Instructions (Signed)
YOU HAD AN ENDOSCOPIC PROCEDURE TODAY AT [facility]:   Refer to the procedure report that was given to you for any specific questions about what was found during the examination.  If the procedure report does not answer your questions, please call your gastroenterologist to clarify.  If you requested that your care partner not be given the details of your procedure findings, then the procedure report has been included in a sealed envelope for you to review at your convenience later.  YOU SHOULD EXPECT: Some feelings of bloating in the abdomen. Passage of more gas than usual.  Walking can help get rid of the air that was put into your GI tract during the procedure and reduce the bloating. If you had a lower endoscopy (such as a colonoscopy or flexible sigmoidoscopy) you may notice spotting of blood in your stool or on the toilet paper. If you underwent a bowel prep for your procedure, you may not have a normal bowel movement for a few days.  Please Note:  You might notice some irritation and congestion in your nose or some drainage.  This is from the oxygen used during your procedure.  There is no need for concern and it should clear up in a day or so.  SYMPTOMS TO REPORT IMMEDIATELY:    Following upper endoscopy (EGD)  Vomiting of blood or coffee ground material  New chest pain or pain under the shoulder blades  Painful or persistently difficult swallowing  New shortness of breath  Fever of 100F or higher  Black, tarry-looking stools   Please see handouts on Hiatal Hernia and esophageal Stricture.  For urgent or emergent issues, a gastroenterologist can be reached at any hour by calling [phone].   DIET:  Please see post Dilation Diet sheet. Clear liquids until 1:30. Then soft diet for the rest of the day. You may proceed to your regular diet tomorrow. Drink plenty of fluids but you should avoid alcoholic beverages for 24 hours.  ACTIVITY:  You should plan to take  it easy for the rest of today and you should NOT DRIVE or use heavy machinery until tomorrow (because of the sedation medicines used during the test).    FOLLOW UP: Our staff will call the number listed on your records the next business day following your procedure to check on you and address any questions or concerns that you may have regarding the information given to you following your procedure. If we do not reach you, we will leave a message.  However, if you are feeling well and you are not experiencing any problems, there is no need to return our call.  We will assume that you have returned to your regular daily activities without incident.  If any biopsies were taken you will be contacted by phone or by letter within the next 1-3 weeks.  Please call us at [phone] if you have not heard about the biopsies in 3 weeks.    SIGNATURES/CONFIDENTIALITY: You and/or your care partner have signed paperwork which will be entered into your electronic medical record.  These signatures attest to the fact that that the information above on your After Visit Summary has been reviewed and is understood.  Full responsibility of the confidentiality of this discharge information lies with you and/or your care-partner.

## 2017-12-31 NOTE — Progress Notes (Signed)
Called to room to assist during endoscopic procedure.  Patient ID and intended procedure confirmed with present staff. Received instructions for my participation in the procedure from the performing physician.  

## 2017-12-31 NOTE — Progress Notes (Signed)
Report to PACU, RN, vss, BBS= Clear.  

## 2017-12-31 NOTE — Op Note (Signed)
[facility] Patient Name: Aaron [name] Procedure Date: 12/31/2017 9:52 AM MRN: [id number] Endoscopist: [name] , MD Age: 82 Referring MD:  Date of Birth: Apr 08, 1935 Gender: Male Account #: [id number] Procedure:                Upper GI endoscopy Indications:              Dysphagia, Follow-up of esophageal cancer. Medicines:                Monitored Anesthesia Care Procedure:                Pre-Anesthesia Assessment:                           - Prior to the procedure, a History and Physical                            was performed, and patient medications and                            allergies were reviewed. The patient's tolerance of                            previous anesthesia was also reviewed. The risks                            and benefits of the procedure and the sedation                            options and risks were discussed with the patient.                            All questions were answered, and informed consent                            was obtained. Prior Anticoagulants: The patient has                            taken no previous anticoagulant or antiplatelet                            agents. ASA Grade Assessment: III - A patient with                            severe systemic disease. After reviewing the risks                            and benefits, the patient was deemed in                            satisfactory condition to undergo the procedure.                           After obtaining informed consent, the endoscope was  passed under direct vision. Throughout the                            procedure, the patient's blood pressure, pulse, and                            oxygen saturations were monitored continuously. The                            Endoscope was introduced through the mouth, and                            advanced to the second part of duodenum. The upper                            GI  endoscopy was somewhat difficult due to abnormal                            anatomy. The patient tolerated the procedure well. Scope In: Scope Out: Findings:                 One benign-appearing, intrinsic moderate stenosis                            was found 23 cm from the incisors. Several small                            erythematous nodules and scarring at the stenosis.                            This stenosis measured 1.1 cm (inner diameter). The                            stenosis was traversed. Biopsies were taken with a                            cold forceps for histology.                           One benign-appearing, intrinsic moderate stenosis                            was found 38 cm from the incisors. This stenosis                            measured 1 cm (inner diameter). The stenosis was                            traversed. A superficial rent was noted in the                            distal esophagus after passing the endoscope  however no resistance to passage of the endoscope                            was noted. A guidewire was placed and the scope was                            withdrawn. Dilations were performed with a Savary                            dilators with mild resistance at 11 mm and 12 mm                            across both strictures. Mild to moderate heme on                            each dilator.                           The exam of the esophagus was otherwise normal.                           A small hiatal hernia was present.                           A few 3 to 4 mm sessile polyps with no bleeding and                            no stigmata of recent bleeding were found in the                            gastric fundus and in the gastric body. Typical                            appearance and location for benign fundic gland                            polyps.                           The exam of the stomach was  otherwise normal.                           The duodenal bulb and second portion of the                            duodenum were normal. Complications:            No immediate complications. Estimated Blood Loss:     Estimated blood loss was minimal. Impression:               - Benign-appearing proximal esophageal stenosis.                            Biopsied. Dilated.                           -  Benign-appearing distal esophageal stenosis.                            Dilated.                           - Small hiatal hernia.                           - A few gastric polyps.                           - Normal duodenal bulb and second portion of the                            duodenum. Recommendation:           - Clear liquid diet for 3 hours, then advance as                            tolerated to soft diet today.                           - Continue present medications.                           - Await pathology results.                           - Return to GI office in 6 weeks. [name], MD 12/31/2017 10:26:08 AM This report has been signed electronically.

## 2018-01-01 ENCOUNTER — Telehealth: Payer: Self-pay

## 2018-01-01 NOTE — Telephone Encounter (Signed)
  Follow up Call-  Call back number 12/31/2017  Post procedure Call Back phone  # [phone]  Permission to leave phone message Yes  Some recent data might be hidden     Patient questions:  Do you have a fever, pain , or abdominal swelling? No. Pain Score  0 *  Have you tolerated food without any problems? Yes.    Have you been able to return to your normal activities? Yes.    Do you have any questions about your discharge instructions: Diet   No. Medications  No. Follow up visit  No.  Do you have questions or concerns about your Care? No.  Actions: * If pain score is 4 or above: No action needed, pain <4.

## 2018-01-17 ENCOUNTER — Ambulatory Visit (AMBULATORY_SURGERY_CENTER): Payer: Self-pay

## 2018-01-17 VITALS — Ht 75.0 in | Wt 206.6 lb

## 2018-01-17 DIAGNOSIS — R897 Abnormal histological findings in specimens from other organs, systems and tissues: Secondary | ICD-10-CM

## 2018-01-17 NOTE — Progress Notes (Signed)
No egg or soy allergy known to patient  No issues with past sedation with any surgeries  or procedures, no intubation problems  No diet pills per patient No home 02 use per patient  No blood thinners per patient  Pt denies issues with constipation  No A fib or A flutter  EMMI video sent to pt's e mail. Pt for EGD

## 2018-01-27 ENCOUNTER — Encounter: Payer: Self-pay | Admitting: Gastroenterology

## 2018-01-27 ENCOUNTER — Ambulatory Visit (AMBULATORY_SURGERY_CENTER): Payer: Medicare Other | Admitting: Gastroenterology

## 2018-01-27 VITALS — BP 118/76 | HR 71 | Temp 96.8°F | Resp 12 | Ht 75.0 in | Wt 202.0 lb

## 2018-01-27 DIAGNOSIS — J449 Chronic obstructive pulmonary disease, unspecified: Secondary | ICD-10-CM | POA: Diagnosis not present

## 2018-01-27 DIAGNOSIS — Z8501 Personal history of malignant neoplasm of esophagus: Secondary | ICD-10-CM | POA: Diagnosis not present

## 2018-01-27 DIAGNOSIS — K317 Polyp of stomach and duodenum: Secondary | ICD-10-CM

## 2018-01-27 DIAGNOSIS — K222 Esophageal obstruction: Secondary | ICD-10-CM

## 2018-01-27 DIAGNOSIS — R1319 Other dysphagia: Secondary | ICD-10-CM

## 2018-01-27 DIAGNOSIS — E785 Hyperlipidemia, unspecified: Secondary | ICD-10-CM | POA: Diagnosis not present

## 2018-01-27 DIAGNOSIS — K229 Disease of esophagus, unspecified: Secondary | ICD-10-CM

## 2018-01-27 DIAGNOSIS — K219 Gastro-esophageal reflux disease without esophagitis: Secondary | ICD-10-CM | POA: Diagnosis not present

## 2018-01-27 DIAGNOSIS — K449 Diaphragmatic hernia without obstruction or gangrene: Secondary | ICD-10-CM | POA: Diagnosis not present

## 2018-01-27 DIAGNOSIS — R1310 Dysphagia, unspecified: Secondary | ICD-10-CM

## 2018-01-27 DIAGNOSIS — R897 Abnormal histological findings in specimens from other organs, systems and tissues: Secondary | ICD-10-CM

## 2018-01-27 DIAGNOSIS — K2210 Ulcer of esophagus without bleeding: Secondary | ICD-10-CM | POA: Diagnosis not present

## 2018-01-27 MED ORDER — SODIUM CHLORIDE 0.9 % IV SOLN: 500.0000 mL | Freq: Once | INTRAVENOUS | Status: DC

## 2018-01-27 NOTE — Patient Instructions (Addendum)
*  Diet instructions given*  YOU HAD AN ENDOSCOPIC PROCEDURE TODAY AT [facility]:   Refer to the procedure report that was given to you for any specific questions about what was found during the examination.  If the procedure report does not answer your questions, please call your gastroenterologist to clarify.  If you requested that your care partner not be given the details of your procedure findings, then the procedure report has been included in a sealed envelope for you to review at your convenience later.  YOU SHOULD EXPECT: Some feelings of bloating in the abdomen. Passage of more gas than usual.  Walking can help get rid of the air that was put into your GI tract during the procedure and reduce the bloating. If you had a lower endoscopy (such as a colonoscopy or flexible sigmoidoscopy) you may notice spotting of blood in your stool or on the toilet paper. If you underwent a bowel prep for your procedure, you may not have a normal bowel movement for a few days.  Please Note:  You might notice some irritation and congestion in your nose or some drainage.  This is from the oxygen used during your procedure.  There is no need for concern and it should clear up in a day or so.  SYMPTOMS TO REPORT IMMEDIATELY:   Following upper endoscopy (EGD)  Vomiting of blood or coffee ground material  New chest pain or pain under the shoulder blades  Painful or persistently difficult swallowing  New shortness of breath  Fever of 100F or higher  Black, tarry-looking stools  For urgent or emergent issues, a gastroenterologist can be reached at any hour by calling [phone].   DIET:  Clear liquids until 1:30 today, soft foods for the rest of today.   Tommorrow you may proceed to your regular diet.  Drink plenty of fluids but you should avoid alcoholic beverages for 24 hours.  ACTIVITY:  You should plan to take it easy for the rest of today and you should NOT DRIVE or use heavy  machinery until tomorrow (because of the sedation medicines used during the test).    FOLLOW UP: Our staff will call the number listed on your records the next business day following your procedure to check on you and address any questions or concerns that you may have regarding the information given to you following your procedure. If we do not reach you, we will leave a message.  However, if you are feeling well and you are not experiencing any problems, there is no need to return our call.  We will assume that you have returned to your regular daily activities without incident.  If any biopsies were taken you will be contacted by phone or by letter within the next 1-3 weeks.  Please call us at [phone] if you have not heard about the biopsies in 3 weeks.    SIGNATURES/CONFIDENTIALITY: You and/or your care partner have signed paperwork which will be entered into your electronic medical record.  These signatures attest to the fact that that the information above on your After Visit Summary has been reviewed and is understood.  Full responsibility of the confidentiality of this discharge information lies with you and/or your care-partner.

## 2018-01-27 NOTE — Op Note (Signed)
[facility] Patient Name: Aaron [name] Procedure Date: 01/27/2018 11:12 AM MRN: [id number] Endoscopist: [name] , MD Age: 82 Referring MD:  Date of Birth: Aug 02, 1935 Gender: Male Account #: [id number] Procedure:                Upper GI endoscopy Indications:              Dysphagia, Follow-up of abnormal esophageal                            biopsies, Stricture of the esophagus Medicines:                Monitored Anesthesia Care Procedure:                Pre-Anesthesia Assessment:                           - Prior to the procedure, a History and Physical                            was performed, and patient medications and                            allergies were reviewed. The patient's tolerance of                            previous anesthesia was also reviewed. The risks                            and benefits of the procedure and the sedation                            options and risks were discussed with the patient.                            All questions were answered, and informed consent                            was obtained. Prior Anticoagulants: The patient has                            taken no previous anticoagulant or antiplatelet                            agents. ASA Grade Assessment: III - A patient with                            severe systemic disease. After reviewing the risks                            and benefits, the patient was deemed in                            satisfactory condition to undergo the procedure.  After obtaining informed consent, the endoscope was                            passed under direct vision. Throughout the                            procedure, the patient's blood pressure, pulse, and                            oxygen saturations were monitored continuously. The                            Endoscope was introduced through the mouth, and                            advanced to the  second part of duodenum. The upper                            GI endoscopy was accomplished without difficulty.                            The patient tolerated the procedure well. Scope In: Scope Out: Findings:                 One benign-appearing, intrinsic moderate stenosis                            was found 23 cm from the incisors. This stenosis                            measured 1.2 cm (inner diameter). Multiple small                            nodules around the stenosis. The stenosis was                            traversed. Biopsies were taken with a cold forceps                            for histology of the stenosis and nodules.                           One benign-appearing, intrinsic moderate stenosis                            was found 38 cm from the incisors. This stenosis                            measured 1.2 cm (inner diameter). The stenosis was                            traversed. A guidewire was placed and the scope was  withdrawn. Dilations were performed across both                            strictures with Savary dilators with mild                            resistance at 12 mm, 13 mm and 14 mm. Heme on all 3                            dilators.                           The exam of the esophagus was otherwise normal.                           A small hiatal hernia was present.                           Multiple small sessile polyps with no bleeding and                            no stigmata of recent bleeding were found in the                            gastric fundus and in the gastric body. Typical                            appearance for gastric fundic polyps.                           The exam of the stomach was otherwise normal.                           The duodenal bulb and second portion of the                            duodenum were normal. Complications:            No immediate complications. Estimated Blood Loss:      Estimated blood loss was minimal. Impression:               - Benign-appearing esophageal stenosis. Biopsied.                            Dilated.                           - Benign-appearing esophageal stenosis. Dilated.                           - Small hiatal hernia.                           - Multiple gastric polyps.                           -  Normal duodenal bulb and second portion of the                            duodenum. Recommendation:           - Patient has a contact number available for                            emergencies. The signs and symptoms of potential                            delayed complications were discussed with the                            patient. Return to normal activities tomorrow.                            Written discharge instructions were provided to the                            patient.                           - Clear liquid diet for 2 hours, then advance as                            tolerated to soft diet today.                           - Continue present medications including omepraozle                            40 mg po daily long term.                           - Await pathology results.                           - Return to GI office in 6 weeks. [name], MD 01/27/2018 11:38:27 AM This report has been signed electronically.

## 2018-01-27 NOTE — Progress Notes (Signed)
Report to PACU, RN, vss, BBS= Clear.  

## 2018-01-27 NOTE — Progress Notes (Signed)
Pt's states no medical or surgical changes since previsit or office visit. 

## 2018-01-27 NOTE — Progress Notes (Signed)
Called to room to assist during endoscopic procedure.  Patient ID and intended procedure confirmed with present staff. Received instructions for my participation in the procedure from the performing physician.  

## 2018-01-28 ENCOUNTER — Telehealth: Payer: Self-pay

## 2018-01-28 NOTE — Telephone Encounter (Signed)
  Follow up Call-  Call back number 01/27/2018 12/31/2017  Post procedure Call Back phone  # [phone]  Permission to leave phone message Yes Yes  Some recent data might be hidden     Patient questions:  Do you have a fever, pain , or abdominal swelling? No. Pain Score  0 *  Have you tolerated food without any problems? Yes.    Have you been able to return to your normal activities? Yes.    Do you have any questions about your discharge instructions: Diet   No. Medications  No. Follow up visit  No.  Do you have questions or concerns about your Care? No.  Actions: * If pain score is 4 or above: No action needed, pain <4.   No problems noted per pt.  maw

## 2018-02-06 ENCOUNTER — Ambulatory Visit: Payer: Self-pay | Admitting: Gastroenterology

## 2018-02-10 ENCOUNTER — Other Ambulatory Visit: Payer: Self-pay | Admitting: Internal Medicine

## 2018-02-12 ENCOUNTER — Other Ambulatory Visit: Payer: Self-pay | Admitting: Internal Medicine

## 2018-02-12 NOTE — Telephone Encounter (Signed)
Copied from [place] [id number]. Topic: Quick Communication - Rx Refill/Question >> Feb 12, 2018  1:43 PM Wynetta Emery, [place] C wrote: Medication: clonazePAM (KLONOPIN) 1 MG tablet -90 day supply quantity at 180  Has the patient contacted their pharmacy? yes  (Agent: If no, request that the patient contact the pharmacy for the refill.) (Agent: If yes, when and what did the pharmacy advise?)  Preferred Pharmacy (with phone number or street name): [facility], [place] [phone] (Phone) [phone] (Fax)    Agent: Please be advised that RX refills may take up to 3 business days. We ask that you follow-up with your pharmacy.

## 2018-02-13 NOTE — Telephone Encounter (Signed)
Requested medication (s) are due for refill today: yes  Requested medication (s) are on the active medication list: yes    Last refill: 04/04/17  #180 1 refill  Future visit scheduled yes  02/27/18  [name]  Notes to clinic:not delegated  Requested Prescriptions  Pending Prescriptions Disp Refills   clonazePAM (KLONOPIN) 1 MG tablet 180 tablet 1    Sig: Take 0.5-1 tablets (0.5-1 mg total) by mouth 2 (two) times daily as needed. for anxiety     Not Delegated - Psychiatry:  Anxiolytics/Hypnotics Failed - 02/13/2018  7:54 AM      Failed - This refill cannot be delegated      Failed - Urine Drug Screen completed in last 360 days.      Passed - Valid encounter within last 6 months    Recent Outpatient Visits          3 months ago Coronary artery disease involving native coronary artery of native heart without angina pectoris   [place] Plotnikov, Evie Lacks, MD   7 months ago COPD exacerbation [facility])   [place], Evie Lacks, MD   7 months ago [place], Marvis Repress, FNP   10 months ago Elevated glucose   [place], Evie Lacks, MD   1 year ago Coronary artery disease involving native coronary artery of native heart without angina pectoris   [place], MD      Future Appointments            In 2 weeks Plotnikov, Evie Lacks, MD [place], [place]

## 2018-02-14 ENCOUNTER — Other Ambulatory Visit: Payer: Self-pay

## 2018-02-14 MED ORDER — CLONAZEPAM 1 MG PO TABS: 0.5000 mg | ORAL_TABLET | Freq: Two times a day (BID) | ORAL | 1 refills | Status: DC | PRN

## 2018-02-17 ENCOUNTER — Other Ambulatory Visit: Payer: Self-pay | Admitting: Internal Medicine

## 2018-02-18 ENCOUNTER — Encounter: Payer: Self-pay | Admitting: Gastroenterology

## 2018-02-27 ENCOUNTER — Ambulatory Visit: Payer: Medicare Other | Admitting: Internal Medicine

## 2018-02-27 DIAGNOSIS — Z0289 Encounter for other administrative examinations: Secondary | ICD-10-CM

## 2018-03-01 ENCOUNTER — Other Ambulatory Visit: Payer: Self-pay | Admitting: Internal Medicine

## 2018-03-03 ENCOUNTER — Ambulatory Visit (INDEPENDENT_AMBULATORY_CARE_PROVIDER_SITE_OTHER): Payer: Medicare Other | Admitting: Gastroenterology

## 2018-03-03 ENCOUNTER — Encounter: Payer: Self-pay | Admitting: Gastroenterology

## 2018-03-03 VITALS — BP 124/70 | HR 66 | Ht 75.0 in | Wt 209.0 lb

## 2018-03-03 DIAGNOSIS — R1319 Other dysphagia: Secondary | ICD-10-CM

## 2018-03-03 DIAGNOSIS — K222 Esophageal obstruction: Secondary | ICD-10-CM | POA: Diagnosis not present

## 2018-03-03 DIAGNOSIS — R1310 Dysphagia, unspecified: Secondary | ICD-10-CM

## 2018-03-03 DIAGNOSIS — I2510 Atherosclerotic heart disease of native coronary artery without angina pectoris: Secondary | ICD-10-CM

## 2018-03-03 NOTE — Progress Notes (Signed)
    History of Present Illness: This is an 82 year old male returning for follow-up of esophageal strictures and dysphagia.  EGD performed 1 month ago as below. He reports no difficulty swallowing. No GI complaints.   EGD 01/2018: - Benign-appearing esophageal stenosis at 23 cm. Biopsied. Dilated. - Benign-appearing esophageal stenosis at 38 cm. Dilated. - Small hiatal hernia. - Multiple gastric polyps. - Normal duodenal bulb and second portion of the duodenum.  Surgical [P], esophageal BX at 23cm - SQUAMOUS MUCOSA WITH FOCAL HYALINE FIBROSIS, ULCERATION AND CHRONIC INFLAMMATION - SEE COMMENT Microscopic Comment PAS stain is NEGATIVE for fungal elements. Dr. Saralyn Pilar reviewed the case and agrees with the above diagnosis.  Current Medications, Allergies, Past Medical History, Past Surgical History, Family History and Social History were reviewed in [employer] record.  Physical Exam: General: Well developed, well nourished, no acute distress Head: Normocephalic and atraumatic Eyes:  sclerae anicteric, EOMI Ears: Normal auditory acuity Neurological: Alert oriented x 4, grossly nonfocal Psychological:  Alert and cooperative. Normal mood and affect   Assessment and Recommendations:  1.  Esophageal strictures.  History of esophageal cancer status post chemoradiation. 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  His proximal esophageal stricture is secondary to residual effects of his cancer and chemoradiation.  We reviewed above and pathology findings. Addressed his question, concerns. Follow standard antireflux measures long term.  Continue omeprazole 40 mg daily long term. Follow up with PCP. GI follow up prn.  I spent 15 minutes of face-to-face time with the patient. Greater than 50% of the time was spent counseling and coordinating care.

## 2018-03-03 NOTE — Patient Instructions (Signed)
Continue omeprazole daily.   Thank you for choosing me and [facility].  Pricilla Riffle. Dagoberto Ligas., MD., Marval Regal

## 2018-04-03 DIAGNOSIS — R972 Elevated prostate specific antigen [PSA]: Secondary | ICD-10-CM | POA: Diagnosis not present

## 2018-04-08 ENCOUNTER — Other Ambulatory Visit: Payer: Self-pay | Admitting: Internal Medicine

## 2018-04-10 ENCOUNTER — Ambulatory Visit (INDEPENDENT_AMBULATORY_CARE_PROVIDER_SITE_OTHER): Payer: Medicare Other | Admitting: Internal Medicine

## 2018-04-10 ENCOUNTER — Encounter: Payer: Self-pay | Admitting: Internal Medicine

## 2018-04-10 ENCOUNTER — Other Ambulatory Visit (INDEPENDENT_AMBULATORY_CARE_PROVIDER_SITE_OTHER): Payer: Medicare Other

## 2018-04-10 VITALS — BP 120/62 | HR 83 | Temp 98.1°F | Ht 75.0 in | Wt 209.0 lb

## 2018-04-10 DIAGNOSIS — C154 Malignant neoplasm of middle third of esophagus: Secondary | ICD-10-CM

## 2018-04-10 DIAGNOSIS — K2100 Gastro-esophageal reflux disease with esophagitis, without bleeding: Secondary | ICD-10-CM

## 2018-04-10 DIAGNOSIS — R972 Elevated prostate specific antigen [PSA]: Secondary | ICD-10-CM | POA: Diagnosis not present

## 2018-04-10 DIAGNOSIS — E785 Hyperlipidemia, unspecified: Secondary | ICD-10-CM | POA: Diagnosis not present

## 2018-04-10 DIAGNOSIS — H6123 Impacted cerumen, bilateral: Secondary | ICD-10-CM | POA: Diagnosis not present

## 2018-04-10 DIAGNOSIS — I10 Essential (primary) hypertension: Secondary | ICD-10-CM | POA: Diagnosis not present

## 2018-04-10 DIAGNOSIS — I2510 Atherosclerotic heart disease of native coronary artery without angina pectoris: Secondary | ICD-10-CM | POA: Diagnosis not present

## 2018-04-10 LAB — BASIC METABOLIC PANEL
BUN: 18 mg/dL (ref 6–23)
CO2: 29 mEq/L (ref 19–32)
CREATININE: 1.43 mg/dL (ref 0.40–1.50)
Calcium: 9.7 mg/dL (ref 8.4–10.5)
Chloride: 101 mEq/L (ref 96–112)
GFR: 57.53 mL/min — ABNORMAL LOW (ref >60.00)
Glucose, Bld: 80 mg/dL (ref 70–99)
Potassium: 3.6 mEq/L (ref 3.5–5.1)
Sodium: 137 mEq/L (ref 135–145)

## 2018-04-10 NOTE — Assessment & Plan Note (Signed)
NAS diet 

## 2018-04-10 NOTE — Assessment & Plan Note (Signed)
F/u w/Dr Stark 

## 2018-04-10 NOTE — Progress Notes (Signed)
Subjective:  Patient ID: Aaron [name], male    DOB: 07/27/1935  Age: 83 y.o. MRN: [id number]  CC: No chief complaint on file.   HPI Aaron [name] presents for anxiety, hypothyroidism, CAD f/u C/o ear wax  Outpatient Medications Prior to Visit  Medication Sig Dispense Refill  . aspirin 81 MG EC tablet Take 81 mg by mouth daily.      . CHOLECALCIFEROL PO Take 1,000 Units by mouth daily.    . clonazePAM (KLONOPIN) 1 MG tablet Take 0.5-1 tablets (0.5-1 mg total) by mouth 2 (two) times daily as needed. for anxiety 180 tablet 1  . clotrimazole-betamethasone (LOTRISONE) cream Apply topically 2 (two) times daily. 15 g 2  . Dextromethorphan-guaiFENesin (MUCUS RELIEF DM COUGH) 20-400 MG TABS Take by mouth.    . levothyroxine (SYNTHROID, LEVOTHROID) 125 MCG tablet TAKE 1 TABLET BY MOUTH  DAILY 90 tablet 3  . omeprazole (PRILOSEC) 40 MG capsule TAKE 1 CAPSULE BY MOUTH  DAILY 90 capsule 1  . promethazine-codeine (PHENERGAN WITH CODEINE) 6.25-10 MG/5ML syrup Take 5 mLs by mouth every 4 (four) hours as needed. 300 mL 0  . simvastatin (ZOCOR) 20 MG tablet TAKE 1 TABLET BY MOUTH  DAILY 90 tablet 3   No facility-administered medications prior to visit.     ROS: Review of Systems  Constitutional: Negative for appetite change, fatigue and unexpected weight change.  HENT: Negative for congestion, nosebleeds, sneezing, sore throat and trouble swallowing.   Eyes: Negative for itching and visual disturbance.  Respiratory: Negative for cough.   Cardiovascular: Negative for chest pain, palpitations and leg swelling.  Gastrointestinal: Negative for abdominal distention, blood in stool, diarrhea and nausea.  Genitourinary: Negative for frequency and hematuria.  Musculoskeletal: Negative for back pain, gait problem, joint swelling and neck pain.  Skin: Negative for rash.  Neurological: Negative for dizziness, tremors, speech difficulty and weakness.  Psychiatric/Behavioral: Negative for agitation,  dysphoric mood and sleep disturbance. The patient is not nervous/anxious.     Objective:  BP 120/62 (BP Location: Left Arm, Patient Position: Sitting, Cuff Size: Normal)   Pulse 83   Temp 98.1 F (36.7 C) (Oral)   Ht 6\' 3"  (1.905 m)   Wt 209 lb (94.8 kg)   SpO2 98%   BMI 26.12 kg/m   BP Readings from Last 3 Encounters:  04/10/18 120/62  03/03/18 124/70  01/27/18 118/76    Wt Readings from Last 3 Encounters:  04/10/18 209 lb (94.8 kg)  03/03/18 209 lb (94.8 kg)  01/27/18 202 lb (91.6 kg)    Physical Exam Constitutional:      General: He is not in acute distress.    Appearance: He is well-developed.     Comments: NAD  Eyes:     Conjunctiva/sclera: Conjunctivae normal.     Pupils: Pupils are equal, round, and reactive to light.  Neck:     Musculoskeletal: Normal range of motion.     Thyroid: No thyromegaly.     Vascular: No JVD.  Cardiovascular:     Rate and Rhythm: Normal rate and regular rhythm.     Heart sounds: Normal heart sounds. No murmur. No friction rub. No gallop.   Pulmonary:     Effort: Pulmonary effort is normal. No respiratory distress.     Breath sounds: Normal breath sounds. No wheezing or rales.  Chest:     Chest wall: No tenderness.  Abdominal:     General: Bowel sounds are normal. There is no distension.     Palpations:  Abdomen is soft. There is no mass.     Tenderness: There is no abdominal tenderness. There is no guarding or rebound.  Musculoskeletal: Normal range of motion.        General: No tenderness.  Lymphadenopathy:     Cervical: No cervical adenopathy.  Skin:    General: Skin is warm and dry.     Findings: No rash.  Neurological:     Mental Status: He is alert and oriented to person, place, and time.     Cranial Nerves: No cranial nerve deficit.     Motor: No abnormal muscle tone.     Coordination: Coordination normal.     Gait: Gait normal.     Deep Tendon Reflexes: Reflexes are normal and symmetric.  Psychiatric:         Behavior: Behavior normal.        Thought Content: Thought content normal.        Judgment: Judgment normal.   ear wax B   Procedure Note :     Procedure :  Ear irrigation right and left ears   Indication:  Cerumen impaction right and left ears   Risks, including pain, dizziness, eardrum perforation, bleeding, infection and others as well as benefits were explained to the patient in detail. Verbal consent was obtained and the patient agreed to proceed.    We used "The Elephant Ear Irrigation Device" filled with lukewarm water for irrigation. A large amount wax was recovered from both ears. Procedure has also required manual wax removal/instrumentation with an ear wax curette and ear forceps on the right and left ears.   Tolerated well. Complications: None.   Postprocedure instructions :  Call if problems.    Lab Results  Component Value Date   WBC 3.9 (L) 04/04/2017   HGB 14.5 04/04/2017   HCT 44.1 04/04/2017   PLT 178.0 04/04/2017   GLUCOSE 91 10/24/2017   CHOL 150 12/05/2017   TRIG 93 12/05/2017   HDL 57 12/05/2017   LDLCALC 74 12/05/2017   ALT 13 11/29/2016   AST 25 11/29/2016   NA 137 10/24/2017   K 3.8 10/24/2017   CL 100 10/24/2017   CREATININE 1.40 10/24/2017   BUN 17 10/24/2017   CO2 31 10/24/2017   TSH 3.31 10/24/2017   PSA 13.12 (H) 04/04/2017   INR 1.19 04/28/2009   HGBA1C 6.9 (H) 10/24/2017    Dg Chest 2 View  Result Date: 07/04/2017 CLINICAL DATA:  Cough and congestion with wheezing EXAM: CHEST - 2 VIEW COMPARISON:  September 24, 2011 FINDINGS: There is slight bibasilar atelectasis. No edema or consolidation. Heart size and pulmonary vascularity are normal. No adenopathy. No bone lesions. IMPRESSION: Slight bibasilar atelectasis.  No evident edema or consolidation. Electronically Signed   By: Lowella Grip III M.D.   On: 07/04/2017 14:05    Assessment & Plan:   There are no diagnoses linked to this encounter.   No orders of the defined types were  placed in this encounter.    Follow-up: No follow-ups on file.  Walker Kehr, MD

## 2018-04-10 NOTE — Assessment & Plan Note (Signed)
On ASA, Simvastatin 

## 2018-04-10 NOTE — Assessment & Plan Note (Signed)
Per Urology Just had a PSA done - 14

## 2018-04-10 NOTE — Assessment & Plan Note (Signed)
Prilosec 

## 2018-04-10 NOTE — Patient Instructions (Signed)
If you have medicare related insurance (such as traditional Medicare, Blue Cross Medicare, United HealthCare Medicare, or similar), Please make an appointment at the scheduling desk with Jill, the Wellness Health Coach, for your Wellness visit in this office, which is a benefit with your insurance.  

## 2018-04-10 NOTE — Assessment & Plan Note (Signed)
Simvastatin 

## 2018-04-10 NOTE — Assessment & Plan Note (Signed)
See procedure 

## 2018-05-02 ENCOUNTER — Inpatient Hospital Stay: Payer: Medicare Other | Attending: Nurse Practitioner | Admitting: Nurse Practitioner

## 2018-05-22 DIAGNOSIS — N401 Enlarged prostate with lower urinary tract symptoms: Secondary | ICD-10-CM | POA: Diagnosis not present

## 2018-05-22 DIAGNOSIS — R972 Elevated prostate specific antigen [PSA]: Secondary | ICD-10-CM | POA: Diagnosis not present

## 2018-05-22 DIAGNOSIS — R3912 Poor urinary stream: Secondary | ICD-10-CM | POA: Diagnosis not present

## 2018-06-05 DIAGNOSIS — H40022 Open angle with borderline findings, high risk, left eye: Secondary | ICD-10-CM | POA: Diagnosis not present

## 2018-06-05 DIAGNOSIS — H25013 Cortical age-related cataract, bilateral: Secondary | ICD-10-CM | POA: Diagnosis not present

## 2018-06-05 DIAGNOSIS — H401211 Low-tension glaucoma, right eye, mild stage: Secondary | ICD-10-CM | POA: Diagnosis not present

## 2018-06-05 DIAGNOSIS — H2513 Age-related nuclear cataract, bilateral: Secondary | ICD-10-CM | POA: Diagnosis not present

## 2018-06-05 LAB — HM DIABETES EYE EXAM

## 2018-10-03 ENCOUNTER — Other Ambulatory Visit: Payer: Self-pay

## 2018-10-03 MED ORDER — CLONAZEPAM 1 MG PO TABS: 0.5000 mg | ORAL_TABLET | Freq: Two times a day (BID) | ORAL | 1 refills | Status: DC | PRN

## 2018-10-03 MED ORDER — OMEPRAZOLE 40 MG PO CPDR: 40.0000 mg | DELAYED_RELEASE_CAPSULE | Freq: Every day | ORAL | 1 refills | Status: DC

## 2018-10-09 ENCOUNTER — Other Ambulatory Visit: Payer: Self-pay

## 2018-10-09 ENCOUNTER — Ambulatory Visit (INDEPENDENT_AMBULATORY_CARE_PROVIDER_SITE_OTHER): Payer: Medicare Other | Admitting: Internal Medicine

## 2018-10-09 ENCOUNTER — Other Ambulatory Visit (INDEPENDENT_AMBULATORY_CARE_PROVIDER_SITE_OTHER): Payer: Medicare Other

## 2018-10-09 ENCOUNTER — Encounter: Payer: Self-pay | Admitting: Internal Medicine

## 2018-10-09 VITALS — BP 116/62 | HR 83 | Temp 98.1°F | Ht 75.0 in | Wt 208.0 lb

## 2018-10-09 DIAGNOSIS — E039 Hypothyroidism, unspecified: Secondary | ICD-10-CM

## 2018-10-09 DIAGNOSIS — I2510 Atherosclerotic heart disease of native coronary artery without angina pectoris: Secondary | ICD-10-CM

## 2018-10-09 DIAGNOSIS — R972 Elevated prostate specific antigen [PSA]: Secondary | ICD-10-CM

## 2018-10-09 DIAGNOSIS — I10 Essential (primary) hypertension: Secondary | ICD-10-CM

## 2018-10-09 DIAGNOSIS — C155 Malignant neoplasm of lower third of esophagus: Secondary | ICD-10-CM

## 2018-10-09 DIAGNOSIS — R7309 Other abnormal glucose: Secondary | ICD-10-CM

## 2018-10-09 LAB — CBC WITH DIFFERENTIAL/PLATELET
Basophils Absolute: 0 10*3/uL (ref 0.0–0.1)
Basophils Relative: 0.5 % (ref 0.0–3.0)
Eosinophils Absolute: 0.1 10*3/uL (ref 0.0–0.7)
Eosinophils Relative: 3.8 % (ref 0.0–5.0)
HCT: 44.1 % (ref 39.0–52.0)
Hemoglobin: 14.7 g/dL (ref 13.0–17.0)
Lymphocytes Relative: 33.2 % (ref 12.0–46.0)
Lymphs Abs: 1.3 10*3/uL (ref 0.7–4.0)
MCHC: 33.3 g/dL (ref 30.0–36.0)
MCV: 91.4 fl (ref 78.0–100.0)
Monocytes Absolute: 0.4 10*3/uL (ref 0.1–1.0)
Monocytes Relative: 10.9 % (ref 3.0–12.0)
Neutro Abs: 2.1 10*3/uL (ref 1.4–7.7)
Neutrophils Relative %: 51.6 % (ref 43.0–77.0)
Platelets: 170 10*3/uL (ref 150.0–400.0)
RBC: 4.83 Mil/uL (ref 4.22–5.81)
RDW: 14.4 % (ref 11.5–15.5)
WBC: 4 10*3/uL (ref 4.0–10.5)

## 2018-10-09 LAB — LIPID PANEL
Cholesterol: 153 mg/dL (ref 0–200)
HDL: 49.8 mg/dL (ref >39.00)
LDL Cholesterol: 77 mg/dL (ref 0–99)
NonHDL: 103.47
Total CHOL/HDL Ratio: 3
Triglycerides: 130 mg/dL (ref 0.0–149.0)
VLDL: 26 mg/dL (ref 0.0–40.0)

## 2018-10-09 LAB — BASIC METABOLIC PANEL
BUN: 12 mg/dL (ref 6–23)
CO2: 30 mEq/L (ref 19–32)
Calcium: 9.9 mg/dL (ref 8.4–10.5)
Chloride: 100 mEq/L (ref 96–112)
Creatinine, Ser: 1.54 mg/dL — ABNORMAL HIGH (ref 0.40–1.50)
GFR: 52.75 mL/min — ABNORMAL LOW (ref >60.00)
Glucose, Bld: 112 mg/dL — ABNORMAL HIGH (ref 70–99)
Potassium: 4.2 mEq/L (ref 3.5–5.1)
Sodium: 137 mEq/L (ref 135–145)

## 2018-10-09 LAB — PSA: PSA: 16.38 ng/mL — ABNORMAL HIGH (ref 0.10–4.00)

## 2018-10-09 LAB — HEMOGLOBIN A1C: Hgb A1c MFr Bld: 6.7 % — ABNORMAL HIGH (ref 4.6–6.5)

## 2018-10-09 LAB — TSH: TSH: 3.53 u[IU]/mL (ref 0.35–4.50)

## 2018-10-09 NOTE — Assessment & Plan Note (Signed)
F/u w/Dr Stark 

## 2018-10-09 NOTE — Assessment & Plan Note (Signed)
Chronic, post XRT for esoph Ca On Levothroid

## 2018-10-09 NOTE — Patient Instructions (Signed)
If you have medicare related insurance (such as traditional Medicare, Blue Cross Medicare, United HealthCare Medicare, or similar), Please make an appointment at the scheduling desk with Jill, the Wellness Health Coach, for your Wellness visit in this office, which is a benefit with your insurance.  

## 2018-10-09 NOTE — Progress Notes (Signed)
Subjective:  Patient ID: Aaron [name], male    DOB: 01/02/1936  Age: 83 y.o. MRN: [id number]  CC: No chief complaint on file.   HPI [name] presents for HTN, CAD, elevated PSA   Outpatient Medications Prior to Visit  Medication Sig Dispense Refill  . aspirin 81 MG EC tablet Take 81 mg by mouth daily.      . CHOLECALCIFEROL PO Take 1,000 Units by mouth daily.    . clonazePAM (KLONOPIN) 1 MG tablet Take 0.5-1 tablets (0.5-1 mg total) by mouth 2 (two) times daily as needed. for anxiety 180 tablet 1  . clotrimazole-betamethasone (LOTRISONE) cream Apply topically 2 (two) times daily. 15 g 2  . Dextromethorphan-guaiFENesin (MUCUS RELIEF DM COUGH) 20-400 MG TABS Take by mouth.    . levothyroxine (SYNTHROID, LEVOTHROID) 125 MCG tablet TAKE 1 TABLET BY MOUTH  DAILY 90 tablet 3  . omeprazole (PRILOSEC) 40 MG capsule Take 1 capsule (40 mg total) by mouth daily. 90 capsule 1  . simvastatin (ZOCOR) 20 MG tablet TAKE 1 TABLET BY MOUTH  DAILY 90 tablet 3   No facility-administered medications prior to visit.     ROS: Review of Systems  Constitutional: Negative for appetite change, fatigue and unexpected weight change.  HENT: Negative for congestion, nosebleeds, sneezing, sore throat and trouble swallowing.   Eyes: Negative for itching and visual disturbance.  Respiratory: Negative for cough.   Cardiovascular: Negative for chest pain, palpitations and leg swelling.  Gastrointestinal: Negative for abdominal distention, blood in stool, diarrhea and nausea.  Genitourinary: Negative for frequency and hematuria.  Musculoskeletal: Negative for back pain, gait problem, joint swelling and neck pain.  Skin: Negative for rash.  Neurological: Negative for dizziness, tremors, speech difficulty and weakness.  Psychiatric/Behavioral: Negative for agitation, dysphoric mood and sleep disturbance. The patient is not nervous/anxious.     Objective:  BP 116/62 (BP Location: Left Arm, Patient  Position: Sitting, Cuff Size: Large)   Pulse 83   Temp 98.1 F (36.7 C) (Oral)   Ht 6\' 3"  (1.905 m)   Wt 208 lb (94.3 kg)   SpO2 97%   BMI 26.00 kg/m   BP Readings from Last 3 Encounters:  10/09/18 116/62  04/10/18 120/62  03/03/18 124/70    Wt Readings from Last 3 Encounters:  10/09/18 208 lb (94.3 kg)  04/10/18 209 lb (94.8 kg)  03/03/18 209 lb (94.8 kg)    Physical Exam Constitutional:      General: He is not in acute distress.    Appearance: He is well-developed.     Comments: NAD  Eyes:     Conjunctiva/sclera: Conjunctivae normal.     Pupils: Pupils are equal, round, and reactive to light.  Neck:     Musculoskeletal: Normal range of motion.     Thyroid: No thyromegaly.     Vascular: No JVD.  Cardiovascular:     Rate and Rhythm: Normal rate and regular rhythm.     Heart sounds: Normal heart sounds. No murmur. No friction rub. No gallop.   Pulmonary:     Effort: Pulmonary effort is normal. No respiratory distress.     Breath sounds: Normal breath sounds. No wheezing or rales.  Chest:     Chest wall: No tenderness.  Abdominal:     General: Bowel sounds are normal. There is no distension.     Palpations: Abdomen is soft. There is no mass.     Tenderness: There is no abdominal tenderness. There is no guarding or rebound.  Musculoskeletal: Normal range of motion.        General: No tenderness.  Lymphadenopathy:     Cervical: No cervical adenopathy.  Skin:    General: Skin is warm and dry.     Findings: No rash.  Neurological:     Mental Status: He is alert and oriented to person, place, and time.     Cranial Nerves: No cranial nerve deficit.     Motor: No abnormal muscle tone.     Coordination: Coordination normal.     Gait: Gait normal.     Deep Tendon Reflexes: Reflexes are normal and symmetric.  Psychiatric:        Behavior: Behavior normal.        Thought Content: Thought content normal.        Judgment: Judgment normal.     Lab Results   Component Value Date   WBC 3.9 (L) 04/04/2017   HGB 14.5 04/04/2017   HCT 44.1 04/04/2017   PLT 178.0 04/04/2017   GLUCOSE 80 04/10/2018   CHOL 150 12/05/2017   TRIG 93 12/05/2017   HDL 57 12/05/2017   LDLCALC 74 12/05/2017   ALT 13 11/29/2016   AST 25 11/29/2016   NA 137 04/10/2018   K 3.6 04/10/2018   CL 101 04/10/2018   CREATININE 1.43 04/10/2018   BUN 18 04/10/2018   CO2 29 04/10/2018   TSH 3.31 10/24/2017   PSA 13.12 (H) 04/04/2017   INR 1.19 04/28/2009   HGBA1C 6.9 (H) 10/24/2017    Dg Chest 2 View  Result Date: 07/04/2017 CLINICAL DATA:  Cough and congestion with wheezing EXAM: CHEST - 2 VIEW COMPARISON:  September 24, 2011 FINDINGS: There is slight bibasilar atelectasis. No edema or consolidation. Heart size and pulmonary vascularity are normal. No adenopathy. No bone lesions. IMPRESSION: Slight bibasilar atelectasis.  No evident edema or consolidation. Electronically Signed   By: Lowella Grip III M.D.   On: 07/04/2017 14:05    Assessment & Plan:   There are no diagnoses linked to this encounter.   No orders of the defined types were placed in this encounter.    Follow-up: No follow-ups on file.  Walker Kehr, MD

## 2018-10-09 NOTE — Assessment & Plan Note (Signed)
ASA, Simvastatin

## 2018-10-09 NOTE — Assessment & Plan Note (Signed)
NAS diet 

## 2018-10-10 ENCOUNTER — Encounter: Payer: Self-pay | Admitting: Internal Medicine

## 2018-10-10 DIAGNOSIS — N183 Chronic kidney disease, stage 3 unspecified: Secondary | ICD-10-CM | POA: Insufficient documentation

## 2018-10-21 ENCOUNTER — Telehealth: Payer: Self-pay | Admitting: Internal Medicine

## 2018-10-21 MED ORDER — PROMETHAZINE-CODEINE 6.25-10 MG/5ML PO SYRP: 5.0000 mL | ORAL_SOLUTION | ORAL | 0 refills | Status: DC | PRN

## 2018-10-21 NOTE — Telephone Encounter (Signed)
Copied from [place] [id number]. Topic: Quick Communication - Rx Refill/Question >> Oct 21, 2018 10:59 AM Yvette Rack wrote: Medication: promethazine-codeine (PHENERGAN WITH CODEINE) 6.25-10 MG/5ML syrup  Has the patient contacted their pharmacy? no  Preferred Pharmacy (with phone number or street name): [id number], [place] [phone] (Phone)  [phone] (Fax)  Agent: Please be advised that RX refills may take up to 3 business days. We ask that you follow-up with your pharmacy.

## 2018-10-21 NOTE — Telephone Encounter (Signed)
MD sent to pof../lmb 

## 2018-10-21 NOTE — Telephone Encounter (Signed)
pls advise on refill.../lmb 

## 2018-10-21 NOTE — Telephone Encounter (Signed)
Ok Thx 

## 2018-11-16 DIAGNOSIS — Z23 Encounter for immunization: Secondary | ICD-10-CM | POA: Diagnosis not present

## 2018-12-06 ENCOUNTER — Other Ambulatory Visit: Payer: Self-pay

## 2018-12-06 ENCOUNTER — Telehealth (INDEPENDENT_AMBULATORY_CARE_PROVIDER_SITE_OTHER): Payer: Medicare Other | Admitting: Family Medicine

## 2018-12-06 ENCOUNTER — Encounter: Payer: Self-pay | Admitting: Family Medicine

## 2018-12-06 DIAGNOSIS — J029 Acute pharyngitis, unspecified: Secondary | ICD-10-CM

## 2018-12-06 DIAGNOSIS — J069 Acute upper respiratory infection, unspecified: Secondary | ICD-10-CM

## 2018-12-06 NOTE — Progress Notes (Signed)
Virtual Visit via Telephone Note  I connected with Aaron [name] on 12/06/18 at 11:20 AM EDT by telephone and verified that I am speaking with the correct person using two identifiers.   I discussed the limitations, risks, security and privacy concerns of performing an evaluation and management service by telephone and the availability of in person appointments. I also discussed with the patient that there may be a patient responsible charge related to this service. The patient expressed understanding and agreed to proceed.  Location patient: home Location provider: work office Participants present for the call: patient, provider Patient did not have a visit in the prior 7 days to address this/these issue(s).   History of Present Illness: [name] is a 83 yo with hx of COPD,chronic cough,allergic rhinitis,and anxiety who is c/o sore throat,rhinorrhea,and nasal congestion. Sore throat exacerbated by swallowing food, 3-4/10.  He thinks it may be COVID 19 infection.  Today temp 98.3 F, "little" chills. He is not coughing more than usual. Denies headache,dysphagia,stridor,body aches,fatigue,changes in appetite,CP,wheezing,dyspnea, abdominal pain, nausea, vomiting, diarrhea, or a skin rash. Negative for MS changes.  Denies changes in smell or taste. He ate breakfast today and had no problems.  He has been taking OTC cough drops.  Today rhinorrhea and nasal congestion seem to be better.  Negative for sick contact, including COVID-19 infected person.   Observations/Objective: Patient sounds cheerful and well on the phone. I do not appreciate any SOB. Speech and thought processing are grossly intact. Patient reported vitals:  Assessment and Plan:  1. Sore throat We discussed possible etiologies,?  Allergic versus viral. I do not think rapid strep or further testing is needed at this time. Symptomatic treatment recommended for now, gargle with saline as needed and OTC throat  lozenges. Instructed about warning signs.  2. URI, acute Most likely viral. He has not had fever. Because symptoms started last night, instructed to monitor for new ones. Symptomatic treatment recommended for now, adequate hydration and OTC acetaminophen if needed.  Explained that although COVID 19 infection needs to be consider at this time he is not having serious symptoms so screening can be arranged in the next few days if he develops fever,smell/taste changes,,or worsening symptom.  Clearly instructed about warning signs, he repeats instructions back to me and understands when to seek immediate medical attention.   Follow Up Instructions:  Return if symptoms worsen or fail to improve.  I did not refer this patient for an OV in the next 24 hours for this/these issue(s).  I discussed the assessment and treatment plan with the patient. He was provided an opportunity to ask questions and all were answered. He agreed with the plan and demonstrated an understanding of the instructions.   The patient was advised to call back or seek an in-person evaluation if the symptoms worsen or if the condition fails to improve as anticipated.  I provided 14 minutes of non-face-to-face time during this encounter.    Yeilyn Gent [place], MD

## 2018-12-08 ENCOUNTER — Telehealth: Payer: Self-pay | Admitting: Gastroenterology

## 2018-12-08 ENCOUNTER — Telehealth: Payer: Self-pay | Admitting: Internal Medicine

## 2018-12-08 ENCOUNTER — Telehealth: Payer: Self-pay

## 2018-12-08 DIAGNOSIS — R509 Fever, unspecified: Secondary | ICD-10-CM

## 2018-12-08 MED ORDER — AZITHROMYCIN 250 MG PO TABS: ORAL_TABLET | ORAL | 0 refills | Status: DC

## 2018-12-08 NOTE — Telephone Encounter (Signed)
The order was placed Thx

## 2018-12-08 NOTE — Telephone Encounter (Signed)
Copied from [place] [phone] [phone]. Topic: General - Other >> Dec 08, 2018  9:00 AM Rainey Pines A wrote: Patients wife stated that patient is not doing any better and that his sore throat,cough, and weakness have gotten worse. Patients wife would like to know what [name] would advise them to do since patient has a virtual visit 2 days ago.

## 2018-12-08 NOTE — Addendum Note (Signed)
Addended by: Cassandria Anger on: 12/08/2018 05:34 PM   Modules accepted: Orders

## 2018-12-08 NOTE — Telephone Encounter (Signed)
Patient spoke with [facility] 9/12 at 10:49am.  States he had nasal drainage, sore throat, body chills and hiccups.  No fever.  States symptoms started the night prior.  Not taking any meds.  Recently changed from PM to AM on Simvastatin (2 weeks prior).  States was tired, weak, irritable, nervous, anxious, shaky and had mild sweating.  State that all symptoms have gone away. Looks like pt had virtual visit on Saturday.

## 2018-12-08 NOTE — Telephone Encounter (Signed)
Patient with a hx of esoph stricture, GERD Last dilation 01/2018.  Okay for direct EGD in the [place]?

## 2018-12-08 NOTE — Telephone Encounter (Signed)
Pt says she is still waiting for a phone call that her husband is no better and does not know what to do

## 2018-12-08 NOTE — Telephone Encounter (Signed)
Please schedule direct EGD/dilation at [facility].  Please be sure he is taking, and has refills for, omeprazole 40 mg daily.

## 2018-12-08 NOTE — Telephone Encounter (Signed)
Noted see other TE

## 2018-12-08 NOTE — Telephone Encounter (Signed)
I have spoken with [name] in regard.  [name] has prescribed patient z pak and is placing an order for COVID testing to [facility].   I have informed patient of this.   Patient did states he was having a sore throat and just felt really tired.   Patient also states he has scheduled an appt with [name] b/c he felt like food sometimes would get stuck in his throat.

## 2018-12-08 NOTE — Addendum Note (Signed)
Addended by: Cassandria Anger on: 12/08/2018 10:22 PM   Modules accepted: Orders

## 2018-12-08 NOTE — Telephone Encounter (Signed)
Patient confirmed that he is taking his omeprazole daily He will come for pre-visit and EGD on 12/11/18 and 12/15/18

## 2018-12-09 ENCOUNTER — Other Ambulatory Visit: Payer: Self-pay

## 2018-12-09 DIAGNOSIS — Z20822 Contact with and (suspected) exposure to covid-19: Secondary | ICD-10-CM

## 2018-12-11 LAB — NOVEL CORONAVIRUS, NAA: SARS-CoV-2, NAA: NOT DETECTED

## 2018-12-12 DIAGNOSIS — N401 Enlarged prostate with lower urinary tract symptoms: Secondary | ICD-10-CM | POA: Diagnosis not present

## 2018-12-12 DIAGNOSIS — R3912 Poor urinary stream: Secondary | ICD-10-CM | POA: Diagnosis not present

## 2018-12-12 DIAGNOSIS — R972 Elevated prostate specific antigen [PSA]: Secondary | ICD-10-CM | POA: Diagnosis not present

## 2018-12-15 ENCOUNTER — Encounter: Payer: Medicare Other | Admitting: Gastroenterology

## 2018-12-17 NOTE — Consult Note (Signed)
Pt requests office visit and wants procedure cancelled.

## 2018-12-22 ENCOUNTER — Encounter: Payer: Self-pay | Admitting: Cardiovascular Disease

## 2018-12-22 ENCOUNTER — Ambulatory Visit (INDEPENDENT_AMBULATORY_CARE_PROVIDER_SITE_OTHER): Payer: Medicare Other | Admitting: Cardiovascular Disease

## 2018-12-22 ENCOUNTER — Other Ambulatory Visit: Payer: Self-pay

## 2018-12-22 VITALS — BP 124/70 | HR 95 | Ht 75.0 in | Wt 199.0 lb

## 2018-12-22 DIAGNOSIS — R972 Elevated prostate specific antigen [PSA]: Secondary | ICD-10-CM | POA: Diagnosis not present

## 2018-12-22 DIAGNOSIS — R002 Palpitations: Secondary | ICD-10-CM

## 2018-12-22 DIAGNOSIS — E785 Hyperlipidemia, unspecified: Secondary | ICD-10-CM | POA: Diagnosis not present

## 2018-12-22 DIAGNOSIS — E039 Hypothyroidism, unspecified: Secondary | ICD-10-CM | POA: Diagnosis not present

## 2018-12-22 DIAGNOSIS — Z8501 Personal history of malignant neoplasm of esophagus: Secondary | ICD-10-CM

## 2018-12-22 DIAGNOSIS — I2510 Atherosclerotic heart disease of native coronary artery without angina pectoris: Secondary | ICD-10-CM | POA: Diagnosis not present

## 2018-12-22 MED ORDER — METOPROLOL SUCCINATE ER 25 MG PO TB24: 12.5000 mg | ORAL_TABLET | Freq: Every day | ORAL | 0 refills | Status: DC

## 2018-12-22 NOTE — Patient Instructions (Signed)
Medication Instructions:  Start Metoprolol Succinate 12.5 mg daily at bedtime. (1/2 of 25 mg pill) If you need a refill on your cardiac medications before your next appointment, please call your pharmacy.   Follow-Up: At [facility], you and your health needs are our priority.  As part of our continuing mission to provide you with exceptional heart care, we have created designated Provider Care Teams.  These Care Teams include your primary Cardiologist (physician) and Advanced Practice Providers (APPs -  Physician Assistants and Nurse Practitioners) who all work together to provide you with the care you need, when you need it. You will need a follow up appointment in 6 months.  Please call our office 2 months in advance to schedule this appointment.  You may see Dr.Kelly or one of the following Advanced Practice Providers on your designated Care Team: Almyra Deforest, [place] . Fabian Sharp, PA-C

## 2018-12-22 NOTE — Progress Notes (Signed)
Patient ID: Aaron [name], male   DOB: [birth year], 83 y.o.   MRN: [id number]     HPI: Aaron [name] is a 83 y.o. male presents to the office today for a 31 month cardiology evaluation.  Aaron [name] has a history of mild CAD with 30% ostial LAD stenosis and 20% OM1 narrowing noted at cardiac catheterization in June 2010. Additional problems include hypertension with mild diastolic dysfunction, hyperlipidemia, and history of esophageal cancer, status post chemotherapy radiation therapy, history of hypothyroidism for which he is on thyroid replacement therapy. Over the past year, Aaron [name] has continued to do well. 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  He denies any change in exercise tolerance.  He denies chest pressure.  He denies palpitations.  He denies presyncope or syncope.  He denies edema.  He does have a history of diverticulosis and mild COPD.  He tells me he recently saw [name] who felt that he was stable with reference to his remote esophageal CA and that there was no need to do any repeat endoscopy.   He underwent a 2-D echo Doppler study in October 2016 which showed an EF of 55-60%.  Mild to moderate aortic insufficiency, mild TR and trivial PR.  I saw him June 2018 at which time he was remaining stable.  I last saw him in September 2019.  He remained active and did not have complaints and was working 4 days/week, 6 hours/day [employer].He denies recurrent chest pain.  He continues to see [name] in follow-up of his esophageal cancer and has had follow-up endoscopy with [name].    Since I last saw him, he is no longer working with the COVID-19 pandemic.  He has noticed occasional episodes of palpitations as well as a slight increase in his resting heart rate.  He sees [name] who has checked his laboratory.  He also was evaluated by Dr. Idamae Schuller for his prostate and he tells me this  past week was started on a new medication.  He denies any chest pain.  He has been on simvastatin for hyperlipidemia.  He continues to be on levothyroxine 125 mcg for hypothyroidism.  He denies chest pain PND orthopnea, presyncope or syncope.  There is a history of diverticulosis and mild COPD.  He presents for follow-up evaluation.  Past Medical History:  Diagnosis Date  . Allergic rhinitis   . Allergy   . Anxiety   . Arrhythmia   . CAD (coronary artery disease)    mild  . COPD (chronic obstructive pulmonary disease) ([place])   . Diverticulosis of colon   . Elevated glucose   . 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 Esophageal stricture   . Gastritis   . GERD (gastroesophageal reflux disease)   . Hemorrhoids   . Hiatal hernia   . History of chemotherapy 05/03/09-06/16/09   Taxol/carboplatin  . History of nuclear stress test 07/2008   exercise; mild-mod perfusion dfect in basal inf/mid inf/apical inf regions; EKG negative for ischemia  . History of radiation therapy 05/03/09-06/16/09   squamous cell ca of esophagus  . Hyperlipidemia    [name]  . Hypothyroid    s/p radiotherapy    Past Surgical History:  Procedure Laterality Date  . CARDIAC CATHETERIZATION  08/2008   mild nonobstructive CAD with 30% LAD stenosis prox &  20% OM1 narrowing   . COLONOSCOPY    . CREATION OF CUTANEOUS STOMA  2011  . ESOPHAGOGASTRODUODENOSCOPY  10/03/2011   Procedure: ESOPHAGOGASTRODUODENOSCOPY (EGD);  Surgeon: [name], MD,FACG;  Location: [name];  Service: Endoscopy;  Laterality: N/A;  no fluro needed  . Lower Back Cyst Removed     [facility]  . POLYPECTOMY    . [name] DILATION  10/03/2011   Procedure: [name] DILATION;  Surgeon: [name], MD,FACG;  Location: [facility];  Service: Endoscopy;  Laterality: N/A;  . TONSILLECTOMY     age 83  . TRANSTHORACIC ECHOCARDIOGRAM  06/2009   EF=>55%; trace MR/TR  . UPPER GASTROINTESTINAL  ENDOSCOPY      Allergies  Allergen Reactions  . Amoxicillin     REACTION: rash  . Cefuroxime Axetil     REACTION: nit feeling well  . Fluconazole     REACTION: shaking  . Escitalopram Other (See Comments)    Nightmares, numbness in tongue, dizziness    Current Outpatient Medications  Medication Sig Dispense Refill  . aspirin 81 MG EC tablet Take 81 mg by mouth daily.      Marland Kitchen azithromycin (ZITHROMAX Z-PAK) 250 MG tablet As directed 6 tablet 0  . CHOLECALCIFEROL PO Take 1,000 Units by mouth daily.    . clonazePAM (KLONOPIN) 1 MG tablet Take 0.5-1 tablets (0.5-1 mg total) by mouth 2 (two) times daily as needed. for anxiety 180 tablet 1  . clotrimazole-betamethasone (LOTRISONE) cream Apply topically 2 (two) times daily. 15 g 2  . Dextromethorphan-guaiFENesin (MUCUS RELIEF DM COUGH) 20-400 MG TABS Take by mouth.    . levothyroxine (SYNTHROID, LEVOTHROID) 125 MCG tablet TAKE 1 TABLET BY MOUTH  DAILY 90 tablet 3  . metoprolol succinate (TOPROL-XL) 25 MG 24 hr tablet Take 0.5 tablets (12.5 mg total) by mouth daily. 90 tablet 0  . omeprazole (PRILOSEC) 40 MG capsule Take 1 capsule (40 mg total) by mouth daily. 90 capsule 1  . promethazine-codeine (PHENERGAN WITH CODEINE) 6.25-10 MG/5ML syrup Take 5 mLs by mouth every 4 (four) hours as needed. 240 mL 0  . simvastatin (ZOCOR) 20 MG tablet TAKE 1 TABLET BY MOUTH  DAILY 90 tablet 3   No current facility-administered medications for this visit.     Social History   Socioeconomic History  . Marital status: Married    Spouse name: Not on file  . Number of children: 3  . Years of education: 12th  . Highest education level: Not on file  Occupational History  . Occupation: Retired, [place] [employer]    Employer: RETIRED    Comment: Supervisor  Social Needs  . Financial resource strain: Not on file  . Food insecurity    Worry: Not on file    Inability: Not on file  . Transportation needs    Medical: Not on file    Non-medical:  Not on file  Tobacco Use  . Smoking status: Former Smoker    Packs/day: 1.00    Years: 40.00    Pack years: 40.00    Quit date: 01/16/2001    Years since quitting: 17.9  . Smokeless tobacco: Never Used  . Tobacco comment: Quit 2002  Substance and Sexual Activity  . Alcohol use: Yes    Alcohol/week: 0.0 standard drinks    Comment: occasional drink 2-3 a month  . Drug use: No    Comment: quit smoking 2002  . Sexual activity: Yes  Lifestyle  . Physical activity  Days per week: Not on file    Minutes per session: Not on file  . Stress: Not on file  Relationships  . Social Herbalist on phone: Not on file    Gets together: Not on file    Attends religious service: Not on file    Active member of club or organization: Not on file    Attends meetings of clubs or organizations: Not on file    Relationship status: Not on file  . Intimate partner violence    Fear of current or ex partner: Not on file    Emotionally abused: Not on file    Physically abused: Not on file    Forced sexual activity: Not on file  Other Topics Concern  . Not on file  Social History Narrative   Daily Caffeine - 1       Family History  Problem Relation Age of Onset  . Prostate cancer Father        48's  . Diabetes Father   . Stroke Mother        aneurism  . Stroke Maternal Grandfather 35  . Prostate cancer Brother 28  . Colon polyps Brother   . Prostate cancer Brother 16  . Colon cancer Sister   . Coronary artery disease Neg Hx   . Hypertension Neg Hx   . Stomach cancer Neg Hx   . Rectal cancer Neg Hx     ROS General: Negative; No fevers, chills, or night sweats;  HEENT: Negative; No changes in vision or hearing, sinus congestion, difficulty swallowing Pulmonary: Negative; No cough, wheezing, shortness of breath, hemoptysis Cardiovascular: Negative; No chest pain, presyncope, syncope, palpitations.  No claudication GI: Remote history of esophageal cancer, status post  chemotherapy and radiation treatment GU: Recently evaluated by [name] Musculoskeletal: Negative; no myalgias, joint pain, or weakness Hematologic/Oncology: Negative; no easy bruising, bleeding Endocrine: Positive for hypothyroidism on Synthroid replacement Neuro: Negative; no changes in balance, headaches Skin: Negative; No rashes or skin lesions Psychiatric: Negative; No behavioral problems, depression Sleep: Negative; No snoring, daytime sleepiness, hypersomnolence, bruxism, restless legs, hypnogognic hallucinations, no cataplexy Other comprehensive 14 point system review is negative.  PE BP 124/70   Pulse 95   Ht 6' 3"  (1.905 m)   Wt 199 lb (90.3 kg)   SpO2 94%   BMI 24.87 kg/m    Repeat blood pressure by me 112/66 supine and 106/66 standing  Wt Readings from Last 3 Encounters:  12/22/18 199 lb (90.3 kg)  10/09/18 208 lb (94.3 kg)  04/10/18 209 lb (94.8 kg)   General: Alert, oriented, no distress.  Skin: normal turgor, no rashes, warm and dry HEENT: Normocephalic, atraumatic. Pupils equal round and reactive to light; sclera anicteric; extraocular muscles intact;  Nose without nasal septal hypertrophy Mouth/Parynx benign; Mallinpatti scale 3 Neck: No JVD, no carotid bruits; normal carotid upstroke Lungs: clear to ausculatation and percussion; no wheezing or rales Chest wall: without tenderness to palpitation Heart: PMI not displaced, RRR, s1 s2 normal, 7-7/1 systolic murmur, no diastolic murmur, no rubs, gallops, thrills, or heaves Abdomen: soft, nontender; no hepatosplenomehaly, BS+; abdominal aorta nontender and not dilated by palpation. Back: no CVA tenderness Pulses 2+ Musculoskeletal: full range of motion, normal strength, no joint deformities Extremities: no clubbing cyanosis or edema, Homan's sign negative  Neurologic: grossly nonfocal; Cranial nerves grossly wnl Psychologic: Normal mood and affect   ECG (independently read by me): Sinus rhythm at 95 bpm with  occasional PVCs with  right bundle morphology; normal intervals.  September 2019 ECG (independently read by me): Normal sinus rhythm at 76 bpm.  One isolated PVC.  No ST segment changes.  Normal intervals.  June 2019 ECG (independently read by me): Normal sinus rhythm at 79 bpm.  No ectopy.  No ST segment changes.  May 2017 ECG (independently read by me): Normal sinus rhythm at 76 bpm.  PR interval 176 ms.  QTc interval 411 milliseconds.  No ST segment changes.  September 2016 ECG (independently read by me): Normal sinus rhythm at 87 bpm.  No ectopy.  Normal intervals.  No ST segment changes.  12/17/2013 ECG (independently read by me): Normal sinus rhythm at 65 beats per minute.  Normal intervals.  No ST segment changes.  Prior September 2014 ECG: Normal sinus rhythm at 60 beats per minute. Intervals are normal.  LABS:  BMP Latest Ref Rng & Units 10/09/2018 04/10/2018 10/24/2017  Glucose 70 - 99 mg/dL 112(H) 80 91  BUN 6 - 23 mg/dL 12 18 17   Creatinine 0.40 - 1.50 mg/dL 1.54(H) 1.43 1.40  Sodium 135 - 145 mEq/L 137 137 137  Potassium 3.5 - 5.1 mEq/L 4.2 3.6 3.8  Chloride 96 - 112 mEq/L 100 101 100  CO2 19 - 32 mEq/L 30 29 31   Calcium 8.4 - 10.5 mg/dL 9.9 9.7 9.8   Hepatic Function Latest Ref Rng & Units 11/29/2016 08/18/2015 08/05/2014  Total Protein 6.0 - 8.3 g/dL 7.8 7.4 7.8  Albumin 3.5 - 5.2 g/dL 4.5 4.2 4.1  AST 0 - 37 U/L 25 28 24   ALT 0 - 53 U/L 13 14 13   Alk Phosphatase 39 - 117 U/L 77 67 82  Total Bilirubin 0.2 - 1.2 mg/dL 0.4 0.4 0.4  Bilirubin, Direct 0.0 - 0.3 mg/dL 0.1 - 0.1   CBC Latest Ref Rng & Units 10/09/2018 04/04/2017 08/18/2015  WBC 4.0 - 10.5 K/uL 4.0 3.9(L) 3.7(L)  Hemoglobin 13.0 - 17.0 g/dL 14.7 14.5 13.8  Hematocrit 39.0 - 52.0 % 44.1 44.1 42.6  Platelets 150.0 - 400.0 K/uL 170.0 178.0 217   Lab Results  Component Value Date   MCV 91.4 10/09/2018   MCV 90.0 04/04/2017   MCV 89.1 08/18/2015   Lab Results  Component Value Date   TSH 3.53 10/09/2018   Lab  Results  Component Value Date   HGBA1C 6.7 (H) 10/09/2018   Lipid Panel     Component Value Date/Time   CHOL 153 10/09/2018 1032   CHOL 150 12/05/2017 0942   TRIG 130.0 10/09/2018 1032   TRIG 120 02/26/2006 1420   HDL 49.80 10/09/2018 1032   HDL 57 12/05/2017 0942   CHOLHDL 3 10/09/2018 1032   VLDL 26.0 10/09/2018 1032   LDLCALC 77 10/09/2018 1032   LDLCALC 74 12/05/2017 0942   IMPRESSION:  1. Coronary artery disease involving native coronary artery of native heart without angina pectoris   2. Palpitations   3. Hypothyroidism, unspecified type   4. Hyperlipidemia with target LDL less than 70   5. PSA elevation   6. History of esophageal cancer     ASSESSMENT AND PLAN: Aaron [name] is an 83 year-old African-American male who has documented mild CAD by cardiac catheterization in June 2010 and has remained stable on medical therapy.  He specifically denies recurrent exertional chest pain or shortness of breath.  His ECG remains unchanged.  His last echo Doppler study has shown normal systolic function with mild diastolic dysfunction, mild to moderate AR mild TR. his  blood pressure today is stable without any significant orthostatic drop.  He has recently experienced episodes of occasional increased heart rate unassociated with presyncope or syncope.  I have suggested the addition of low-dose Toprol-XL initially at 12.5 mg to take at bedtime which should be helpful to control his palpitations.  His blood pressure is on the low side.  His resting pulse is 95.  Continues to be on levothyroxine 125 mcg for hypothyroidism.  He is tolerating simvastatin 20 mg for hyperlipidemia with target LDL less than 70.  [name] checks his laboratory and his PSA 2 months ago had increased to 16.38.  He is now under evaluation with [name] of urology.  Review of his laboratory over the past year also indicates early overt diabetes with hemoglobin A1c's in excess of 6.5 most recent level was 6.7.  I have  recommended when he sees [name] to mention this to him since he may benefit from initiation of low-dose metformin.  Continues to see [name] for his esophageal CA history.  I will see him in 6 months for follow-up evaluation  Time spent: 25 minutes  Troy Sine, MD, [facility]  12/22/2018 6:51 PM

## 2018-12-30 ENCOUNTER — Other Ambulatory Visit: Payer: Self-pay | Admitting: Internal Medicine

## 2019-01-05 DIAGNOSIS — H401211 Low-tension glaucoma, right eye, mild stage: Secondary | ICD-10-CM | POA: Diagnosis not present

## 2019-01-05 DIAGNOSIS — H40022 Open angle with borderline findings, high risk, left eye: Secondary | ICD-10-CM | POA: Diagnosis not present

## 2019-01-07 ENCOUNTER — Encounter: Payer: Self-pay | Admitting: Gastroenterology

## 2019-01-07 ENCOUNTER — Other Ambulatory Visit: Payer: Self-pay

## 2019-01-07 ENCOUNTER — Ambulatory Visit (INDEPENDENT_AMBULATORY_CARE_PROVIDER_SITE_OTHER): Payer: Medicare Other | Admitting: Gastroenterology

## 2019-01-07 VITALS — BP 110/64 | HR 72 | Temp 97.4°F | Ht 75.0 in | Wt 198.6 lb

## 2019-01-07 DIAGNOSIS — Z8719 Personal history of other diseases of the digestive system: Secondary | ICD-10-CM | POA: Diagnosis not present

## 2019-01-07 DIAGNOSIS — I2510 Atherosclerotic heart disease of native coronary artery without angina pectoris: Secondary | ICD-10-CM

## 2019-01-07 DIAGNOSIS — K219 Gastro-esophageal reflux disease without esophagitis: Secondary | ICD-10-CM

## 2019-01-07 NOTE — Patient Instructions (Signed)
Continue omeprazole.   Follow up with [name] in one year.  Thank you for choosing me and [facility].  Pricilla Riffle. Dagoberto Ligas., MD., [facility]

## 2019-01-07 NOTE — Progress Notes (Signed)
    History of Present Illness: This is an 83 year old male with esophageal strictures.  History of esophageal cancer status post chemoradiation. 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  His proximal esophageal stricture is secondary to residual effects of his cancer and chemoradiation. He had a sore throat with congestion and noted some difficulty swallowing. When the symptoms resolved he no longer noted problems swallowing. He notes belching and occasional reflux.   Current Medications, Allergies, Past Medical History, Past Surgical History, Family History and SocialHistory were reviewed in [employer] record.   Physical Exam: General: Well developed, well nourished, no acute distress Head: Normocephalic and atraumatic Eyes:  sclerae anicteric, EOMI Ears: Normal auditory acuity Mouth: No deformity or lesions Lungs: Clear throughout to auscultation Heart: Regular rate and rhythm; no murmurs, rubs or bruits Abdomen: Soft, non tender and non distended. No masses, hepatosplenomegaly or hernias noted. Normal Bowel sounds Rectal: Musculoskeletal: Symmetrical with no gross deformities  Pulses:  Normal pulses noted Extremities: No clubbing, cyanosis, edema or deformities noted Neurological: Alert oriented x 4, grossly nonfocal Psychological:  Alert and cooperative. Normal mood and affect   Assessment and Recommendations:  1. GERD. History of esophageal strictures from GERD and radiation. Omeprazole 40 mg po qd long term. Follow antireflux measures long term. Call if dysphagia returns. REV in 1 year.   2. History of esophageal cancer. S/P chemoradiation.

## 2019-01-12 ENCOUNTER — Other Ambulatory Visit: Payer: Self-pay | Admitting: Internal Medicine

## 2019-01-15 ENCOUNTER — Telehealth: Payer: Self-pay | Admitting: Internal Medicine

## 2019-01-15 NOTE — Telephone Encounter (Signed)
Pt called stated that he had throat cancer a long time ago and he was told that he would always have a the cough. He state that he use this every once and a while if he get the cough.  It will take care of it and he only takes it only once every 2 months of so.  Pharmacy - [facility] [place] and [address] [facility] [address]

## 2019-01-16 NOTE — Telephone Encounter (Signed)
Please advise 

## 2019-01-18 MED ORDER — PROMETHAZINE-CODEINE 6.25-10 MG/5ML PO SYRP: 5.0000 mL | ORAL_SOLUTION | ORAL | 0 refills | Status: DC | PRN

## 2019-01-18 NOTE — Telephone Encounter (Signed)
Cough syrup prescription emailed.  Thanks

## 2019-01-18 NOTE — Addendum Note (Signed)
Addended by: Cassandria Anger on: 01/18/2019 06:07 PM   Modules accepted: Orders

## 2019-01-19 NOTE — Telephone Encounter (Signed)
Pt informed of below.  

## 2019-01-25 ENCOUNTER — Other Ambulatory Visit: Payer: Self-pay | Admitting: Internal Medicine

## 2019-02-17 ENCOUNTER — Other Ambulatory Visit: Payer: Self-pay

## 2019-02-17 DIAGNOSIS — Z20828 Contact with and (suspected) exposure to other viral communicable diseases: Secondary | ICD-10-CM | POA: Diagnosis not present

## 2019-02-17 DIAGNOSIS — Z20822 Contact with and (suspected) exposure to covid-19: Secondary | ICD-10-CM

## 2019-02-18 ENCOUNTER — Other Ambulatory Visit: Payer: Self-pay

## 2019-02-19 LAB — NOVEL CORONAVIRUS, NAA: SARS-CoV-2, NAA: NOT DETECTED

## 2019-02-23 ENCOUNTER — Telehealth: Payer: Self-pay

## 2019-02-23 NOTE — Telephone Encounter (Signed)
Patient given negative result and verbalized understanding  

## 2019-03-05 ENCOUNTER — Other Ambulatory Visit: Payer: Self-pay | Admitting: Cardiovascular Disease

## 2019-03-05 NOTE — Telephone Encounter (Signed)
*  STAT* If patient is at the pharmacy, call can be transferred to refill team.   1. Which medications need to be refilled? (please list name of each medication and dose if known) metoprolol succinate (TOPROL-XL) 25 MG 24 hr tablet  2. Which pharmacy/location (including street and city if local pharmacy) is medication to be sent to? [facility] [place], [place]  3. Do they need a 30 day or 90 day supply? 90 day  Patient has 4 days left of medication

## 2019-03-11 NOTE — Telephone Encounter (Signed)
Contacted patient and he stated his metoprolol was filled but he doesn't know by who. Advised him to contact our office when he needs another refill. He voiced understanding.

## 2019-03-13 DIAGNOSIS — R972 Elevated prostate specific antigen [PSA]: Secondary | ICD-10-CM | POA: Diagnosis not present

## 2019-03-26 ENCOUNTER — Telehealth: Payer: Self-pay | Admitting: Internal Medicine

## 2019-03-26 NOTE — Telephone Encounter (Signed)
Patient is calling to ask does [name] recommend him to receive the COVID. In the newspaper, that next week those 27 and older would be next in line to get the vaccine.Please advise [phone] Advised the patient [place] is following plan 1a at this time.

## 2019-03-26 NOTE — Telephone Encounter (Signed)
Please advise 

## 2019-03-27 NOTE — Telephone Encounter (Signed)
Yes, he needs to get vaccinated when the vaccine is available.  Thanks

## 2019-03-31 NOTE — Telephone Encounter (Signed)
Called pt, LVM.   

## 2019-04-07 ENCOUNTER — Telehealth: Payer: Self-pay | Admitting: *Deleted

## 2019-04-07 NOTE — Telephone Encounter (Signed)
Patient is concerned about taking the COVID vaccine with his current health issues. Patient notified everyone has been encouraged to take vaccine and there have been no health concerns/illness given to prevent injection. Will send concern to PCP for verification. Patient also has appointment on 1/14 and he needs to know if this appointment is in person or phone call.

## 2019-04-07 NOTE — Telephone Encounter (Signed)
Patient's wife called and states he is scheduled for the covid vaccine  04/16/19, and would like to know it this ok due to his medical history,please advise  [phone].

## 2019-04-07 NOTE — Telephone Encounter (Signed)
  Scheduler contacted patient , Covid19 screening questions asked. No symptoms Patient made aware of new location and registration phone number

## 2019-04-09 ENCOUNTER — Other Ambulatory Visit: Payer: Self-pay

## 2019-04-09 ENCOUNTER — Ambulatory Visit (INDEPENDENT_AMBULATORY_CARE_PROVIDER_SITE_OTHER): Payer: Medicare Other | Admitting: Internal Medicine

## 2019-04-09 ENCOUNTER — Encounter: Payer: Self-pay | Admitting: Internal Medicine

## 2019-04-09 DIAGNOSIS — F419 Anxiety disorder, unspecified: Secondary | ICD-10-CM

## 2019-04-09 DIAGNOSIS — N183 Chronic kidney disease, stage 3 unspecified: Secondary | ICD-10-CM

## 2019-04-09 DIAGNOSIS — I2510 Atherosclerotic heart disease of native coronary artery without angina pectoris: Secondary | ICD-10-CM | POA: Diagnosis not present

## 2019-04-09 DIAGNOSIS — C154 Malignant neoplasm of middle third of esophagus: Secondary | ICD-10-CM | POA: Diagnosis not present

## 2019-04-09 DIAGNOSIS — E785 Hyperlipidemia, unspecified: Secondary | ICD-10-CM | POA: Diagnosis not present

## 2019-04-09 DIAGNOSIS — E039 Hypothyroidism, unspecified: Secondary | ICD-10-CM | POA: Diagnosis not present

## 2019-04-09 DIAGNOSIS — R7309 Other abnormal glucose: Secondary | ICD-10-CM

## 2019-04-09 DIAGNOSIS — I10 Essential (primary) hypertension: Secondary | ICD-10-CM

## 2019-04-09 LAB — TSH: TSH: 3.74 u[IU]/mL (ref 0.35–4.50)

## 2019-04-09 LAB — BASIC METABOLIC PANEL
BUN: 17 mg/dL (ref 6–23)
CO2: 29 mEq/L (ref 19–32)
Calcium: 10.3 mg/dL (ref 8.4–10.5)
Chloride: 100 mEq/L (ref 96–112)
Creatinine, Ser: 1.58 mg/dL — ABNORMAL HIGH (ref 0.40–1.50)
GFR: 51.15 mL/min — ABNORMAL LOW (ref >60.00)
Glucose, Bld: 108 mg/dL — ABNORMAL HIGH (ref 70–99)
Potassium: 3.9 mEq/L (ref 3.5–5.1)
Sodium: 138 mEq/L (ref 135–145)

## 2019-04-09 LAB — HEMOGLOBIN A1C: Hgb A1c MFr Bld: 6.8 % — ABNORMAL HIGH (ref 4.6–6.5)

## 2019-04-09 NOTE — Assessment & Plan Note (Signed)
F/u w/Dr Stark 

## 2019-04-09 NOTE — Progress Notes (Signed)
Subjective:  Patient ID: Aaron [name], male    DOB: 1935-12-15  Age: 84 y.o. MRN: [id number]  CC: No chief complaint on file.   HPI Aaron [name] presents for hypothyroidism, anxiety, dyslipidemia f/u  Outpatient Medications Prior to Visit  Medication Sig Dispense Refill  . aspirin 81 MG EC tablet Take 81 mg by mouth daily.      . CHOLECALCIFEROL PO Take 1,000 Units by mouth daily.    . clonazePAM (KLONOPIN) 1 MG tablet Take 0.5-1 tablets (0.5-1 mg total) by mouth 2 (two) times daily as needed. for anxiety 180 tablet 1  . clotrimazole-betamethasone (LOTRISONE) cream Apply topically 2 (two) times daily. 15 g 2  . Dextromethorphan-guaiFENesin (MUCUS RELIEF DM COUGH) 20-400 MG TABS Take by mouth.    . finasteride (PROSCAR) 5 MG tablet Take 5 mg by mouth daily.    Marland Kitchen levothyroxine (SYNTHROID) 125 MCG tablet TAKE 1 TABLET BY MOUTH  DAILY 90 tablet 3  . metoprolol succinate (TOPROL-XL) 25 MG 24 hr tablet Take 0.5 tablets (12.5 mg total) by mouth daily. 90 tablet 0  . omeprazole (PRILOSEC) 40 MG capsule TAKE 1 CAPSULE BY MOUTH  DAILY 90 capsule 3  . promethazine-codeine (PHENERGAN WITH CODEINE) 6.25-10 MG/5ML syrup Take 5 mLs by mouth every 4 (four) hours as needed. 240 mL 0  . simvastatin (ZOCOR) 20 MG tablet TAKE 1 TABLET BY MOUTH  DAILY 90 tablet 3   No facility-administered medications prior to visit.    ROS: Review of Systems  Constitutional: Negative for appetite change, fatigue and unexpected weight change.  HENT: Negative for congestion, nosebleeds, sneezing, sore throat and trouble swallowing.   Eyes: Negative for itching and visual disturbance.  Respiratory: Negative for cough.   Cardiovascular: Negative for chest pain, palpitations and leg swelling.  Gastrointestinal: Negative for abdominal distention, blood in stool, diarrhea and nausea.  Genitourinary: Negative for frequency and hematuria.  Musculoskeletal: Negative for back pain, gait problem, joint swelling and neck  pain.  Skin: Negative for rash.  Neurological: Negative for dizziness, tremors, speech difficulty and weakness.  Psychiatric/Behavioral: Negative for agitation, dysphoric mood, sleep disturbance and suicidal ideas. The patient is not nervous/anxious.     Objective:  BP 120/74 (BP Location: Left Arm, Patient Position: Sitting, Cuff Size: Large)   Pulse 81   Temp 98.2 F (36.8 C) (Oral)   Ht 6\' 3"  (1.905 m)   Wt 205 lb (93 kg)   SpO2 96%   BMI 25.62 kg/m   BP Readings from Last 3 Encounters:  04/09/19 120/74  01/07/19 110/64  12/22/18 124/70    Wt Readings from Last 3 Encounters:  04/09/19 205 lb (93 kg)  01/07/19 198 lb 9.6 oz (90.1 kg)  12/22/18 199 lb (90.3 kg)    Physical Exam Constitutional:      General: He is not in acute distress.    Appearance: He is well-developed.     Comments: NAD  Eyes:     Conjunctiva/sclera: Conjunctivae normal.     Pupils: Pupils are equal, round, and reactive to light.  Neck:     Thyroid: No thyromegaly.     Vascular: No JVD.  Cardiovascular:     Rate and Rhythm: Normal rate and regular rhythm.     Heart sounds: Normal heart sounds. No murmur. No friction rub. No gallop.   Pulmonary:     Effort: Pulmonary effort is normal. No respiratory distress.     Breath sounds: Normal breath sounds. No wheezing or rales.  Chest:  Chest wall: No tenderness.  Abdominal:     General: Bowel sounds are normal. There is no distension.     Palpations: Abdomen is soft. There is no mass.     Tenderness: There is no abdominal tenderness. There is no guarding or rebound.  Musculoskeletal:        General: No tenderness. Normal range of motion.     Cervical back: Normal range of motion.  Lymphadenopathy:     Cervical: No cervical adenopathy.  Skin:    General: Skin is warm and dry.     Findings: No rash.  Neurological:     Mental Status: He is alert and oriented to person, place, and time.     Cranial Nerves: No cranial nerve deficit.      Motor: No abnormal muscle tone.     Coordination: Coordination normal.     Gait: Gait normal.     Deep Tendon Reflexes: Reflexes are normal and symmetric.  Psychiatric:        Behavior: Behavior normal.        Thought Content: Thought content normal.        Judgment: Judgment normal.     Lab Results  Component Value Date   WBC 4.0 10/09/2018   HGB 14.7 10/09/2018   HCT 44.1 10/09/2018   PLT 170.0 10/09/2018   GLUCOSE 112 (H) 10/09/2018   CHOL 153 10/09/2018   TRIG 130.0 10/09/2018   HDL 49.80 10/09/2018   LDLCALC 77 10/09/2018   ALT 13 11/29/2016   AST 25 11/29/2016   NA 137 10/09/2018   K 4.2 10/09/2018   CL 100 10/09/2018   CREATININE 1.54 (H) 10/09/2018   BUN 12 10/09/2018   CO2 30 10/09/2018   TSH 3.53 10/09/2018   PSA 16.38 (H) 10/09/2018   INR 1.19 04/28/2009   HGBA1C 6.7 (H) 10/09/2018    DG Chest 2 View  Result Date: 07/04/2017 CLINICAL DATA:  Cough and congestion with wheezing EXAM: CHEST - 2 VIEW COMPARISON:  September 24, 2011 FINDINGS: There is slight bibasilar atelectasis. No edema or consolidation. Heart size and pulmonary vascularity are normal. No adenopathy. No bone lesions. IMPRESSION: Slight bibasilar atelectasis.  No evident edema or consolidation. Electronically Signed   By: Lowella Grip III M.D.   On: 07/04/2017 14:05    Assessment & Plan:   There are no diagnoses linked to this encounter.   No orders of the defined types were placed in this encounter.    Follow-up: No follow-ups on file.  Walker Kehr, MD

## 2019-04-09 NOTE — Assessment & Plan Note (Signed)
On ASA, Simvastatin 

## 2019-04-09 NOTE — Assessment & Plan Note (Signed)
Simvastatin 

## 2019-04-09 NOTE — Assessment & Plan Note (Signed)
BMET 

## 2019-04-09 NOTE — Assessment & Plan Note (Signed)
Clonazepam prn  Potential benefits of a long term benzodiazepines  use as well as potential risks  and complications were explained to the patient and were aknowledged.  

## 2019-04-09 NOTE — Patient Instructions (Signed)
You can pre-medicate yourself with Benadryl 25 mg (it may make you sleepy) and Tylenol 650 mg 1 hour prior to the vaccination.

## 2019-04-16 ENCOUNTER — Other Ambulatory Visit: Payer: Self-pay | Admitting: Internal Medicine

## 2019-04-20 ENCOUNTER — Other Ambulatory Visit: Payer: Self-pay

## 2019-04-20 MED ORDER — FINASTERIDE 5 MG PO TABS: 5.0000 mg | ORAL_TABLET | Freq: Every day | ORAL | 3 refills | Status: DC

## 2019-06-05 ENCOUNTER — Other Ambulatory Visit: Payer: Self-pay | Admitting: Cardiovascular Disease

## 2019-06-10 ENCOUNTER — Other Ambulatory Visit: Payer: Self-pay

## 2019-06-10 ENCOUNTER — Ambulatory Visit (INDEPENDENT_AMBULATORY_CARE_PROVIDER_SITE_OTHER): Payer: Medicare Other | Admitting: Cardiovascular Disease

## 2019-06-10 ENCOUNTER — Encounter: Payer: Self-pay | Admitting: Cardiovascular Disease

## 2019-06-10 VITALS — BP 120/74 | HR 81 | Temp 95.9°F | Ht 75.0 in | Wt 210.0 lb

## 2019-06-10 DIAGNOSIS — R972 Elevated prostate specific antigen [PSA]: Secondary | ICD-10-CM | POA: Diagnosis not present

## 2019-06-10 DIAGNOSIS — Z8501 Personal history of malignant neoplasm of esophagus: Secondary | ICD-10-CM | POA: Diagnosis not present

## 2019-06-10 DIAGNOSIS — R002 Palpitations: Secondary | ICD-10-CM

## 2019-06-10 DIAGNOSIS — E039 Hypothyroidism, unspecified: Secondary | ICD-10-CM

## 2019-06-10 DIAGNOSIS — E785 Hyperlipidemia, unspecified: Secondary | ICD-10-CM

## 2019-06-10 DIAGNOSIS — I2510 Atherosclerotic heart disease of native coronary artery without angina pectoris: Secondary | ICD-10-CM | POA: Diagnosis not present

## 2019-06-10 DIAGNOSIS — I351 Nonrheumatic aortic (valve) insufficiency: Secondary | ICD-10-CM | POA: Diagnosis not present

## 2019-06-10 MED ORDER — METOPROLOL SUCCINATE ER 25 MG PO TB24: ORAL_TABLET | ORAL | 2 refills | Status: DC

## 2019-06-10 NOTE — Patient Instructions (Signed)
Medication Instructions:  PRESCRIPTION FOR METOPROLOL SUCCINATE REFILLED TODAY AND SENT TO YOUR PREFERRED PHARMACY  *If you need a refill on your cardiac medications before your next appointment, please call your pharmacy*  Follow-Up: At [facility], you and your health needs are our priority.  As part of our continuing mission to provide you with exceptional heart care, we have created designated Provider Care Teams.  These Care Teams include your primary Cardiologist (physician) and Advanced Practice Providers (APPs -  Physician Assistants and Nurse Practitioners) who all work together to provide you with the care you need, when you need it.  We recommend signing up for the patient portal called "MyChart".  Sign up information is provided on this After Visit Summary.  MyChart is used to connect with patients for Virtual Visits (Telemedicine).  Patients are able to view lab/test results, encounter notes, upcoming appointments, etc.  Non-urgent messages can be sent to your provider as well.   To learn more about what you can do with MyChart, go to [email or web address].    Your next appointment:   6 month(s)  The format for your next appointment:   In Person  Provider:   You may see  one of the following Advanced Practice Providers on your designated Care Team:    Almyra Deforest, PA-C  Fabian Sharp, PA-C or   Roby Lofts, PA-C    Other Instructions FOLLOW UP WITH [name] IN 12 MONTHS

## 2019-06-10 NOTE — Progress Notes (Signed)
Patient ID: Aaron [name], male   DOB: [birth year], 84 y.o.   MRN: [id number]     HPI: Aaron [name] is a 84 y.o. male presents to the office today for a 6 month cardiology evaluation.  Aaron [name] has a history of mild CAD with 30% ostial LAD stenosis and 20% OM1 narrowing noted at cardiac catheterization in June 2010. Additional problems include hypertension with mild diastolic dysfunction, hyperlipidemia, and history of esophageal cancer, status post chemotherapy radiation therapy, history of hypothyroidism for which he is on thyroid replacement therapy. Over the past year, Aaron [name] has continued to do well. 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  He denies any change in exercise tolerance.  He denies chest pressure.  He denies palpitations.  He denies presyncope or syncope.  He denies edema.  He does have a history of diverticulosis and mild COPD.  He tells me he recently saw Aaron [name] who felt that he was stable with reference to his remote esophageal CA and that there was no need to do any repeat endoscopy.   He underwent a 2-D echo Doppler study in October 2016 which showed an EF of 55-60%.  Mild to moderate aortic insufficiency, mild TR and trivial PR.  I saw him June 2018 at which time he was remaining stable.  I last saw him in September 2019.  He remained active and did not have complaints and was working 4 days/week, 6 hours/day [employer].He denies recurrent chest pain.  He continues to see Aaron [name] in follow-up of his esophageal cancer and has had follow-up endoscopy with Aaron [name].    I last saw him in September 2020 at which time he was no longer working since the COVID-19 pandemic.  He had noticed occasional episodes of palpitations as well as a slight increase in his resting heart rate.  He sees Aaron [name] who has checked his laboratory.  He also was evaluated by Aaron [name] for his  prostate. He denies any chest pain.  He has been on simvastatin for hyperlipidemia.  He continues to be on levothyroxine 125 mcg for hypothyroidism.  He denies chest pain PND orthopnea, presyncope or syncope.  There is a history of diverticulosis and mild COPD.  During that evaluation, with his episodes of occasional increased heart rate I suggested the addition of low-dose metoprolol succinate initially at 12.5 mg to take at bedtime to be helpful to control his palpitations.  With hemoglobin A1c at 6.7 I recommended follow-up with Aaron [name] for diabetes mellitus.  Since I last saw him, he has felt well.  He notes significant improvement in palpitations since initiating the Toprol-XL to 12.5 mg.  He enjoys working in the yard and carries a Secretary/administrator which weighs 75 pounds on his back and typically works for up to an hour before stopping to rest.  He denies chest pain PND orthopnea.  He denies presyncope or syncope.  He presents for evaluation.  Past Medical History:  Diagnosis Date  . Allergic rhinitis   . Allergy   . Anxiety   . Arrhythmia   . CAD (coronary artery disease)    mild  . COPD (chronic obstructive pulmonary disease) ([place])   . Diverticulosis of colon   . Elevated glucose   . 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 Esophageal stricture   .  Gastritis   . GERD (gastroesophageal reflux disease)   . Hemorrhoids   . Hiatal hernia   . History of chemotherapy 05/03/09-06/16/09   Taxol/carboplatin  . History of nuclear stress test 07/2008   exercise; mild-mod perfusion dfect in basal inf/mid inf/apical inf regions; EKG negative for ischemia  . History of radiation therapy 05/03/09-06/16/09   squamous cell ca of esophagus  . Hyperlipidemia    Aaron [name]  . Hypothyroid    s/p radiotherapy    Past Surgical History:  Procedure Laterality Date  . CARDIAC CATHETERIZATION  08/2008   mild nonobstructive CAD with 30% LAD stenosis prox  & 20% OM1 narrowing   . COLONOSCOPY    . CREATION OF CUTANEOUS STOMA  2011  . ESOPHAGOGASTRODUODENOSCOPY  10/03/2011   Procedure: ESOPHAGOGASTRODUODENOSCOPY (EGD);  Surgeon: Aaron Artist, MD,FACG;  Location: [name];  Service: Endoscopy;  Laterality: N/A;  no fluro needed  . Lower Back Cyst Removed     [facility]  . POLYPECTOMY    . [name] DILATION  10/03/2011   Procedure: [name] DILATION;  Surgeon: Aaron Artist, MD,FACG;  Location: [facility]COPY;  Service: Endoscopy;  Laterality: N/A;  . TONSILLECTOMY     age 33  . TRANSTHORACIC ECHOCARDIOGRAM  06/2009   EF=>55%; trace MR/TR  . UPPER GASTROINTESTINAL ENDOSCOPY      Allergies  Allergen Reactions  . Amoxicillin     REACTION: rash  . Cefuroxime Axetil     REACTION: nit feeling well  . Fluconazole     REACTION: shaking  . Escitalopram Other (See Comments)    Nightmares, numbness in tongue, dizziness    Current Outpatient Medications  Medication Sig Dispense Refill  . aspirin 81 MG EC tablet Take 81 mg by mouth daily.      . CHOLECALCIFEROL PO Take 1,000 Units by mouth daily.    . clonazePAM (KLONOPIN) 1 MG tablet TAKE 1/2 TO 1 TABLET BY  MOUTH 2 TIMES DAILY AS  NEEDED FOR ANXIETY 180 tablet 1  . clotrimazole-betamethasone (LOTRISONE) cream Apply topically 2 (two) times daily. 15 g 2  . Dextromethorphan-guaiFENesin (MUCUS RELIEF DM COUGH) 20-400 MG TABS Take 1 tablet by mouth daily as needed.     . finasteride (PROSCAR) 5 MG tablet Take 1 tablet (5 mg total) by mouth daily. 90 tablet 3  . levothyroxine (SYNTHROID) 125 MCG tablet TAKE 1 TABLET BY MOUTH  DAILY 90 tablet 3  . metoprolol succinate (TOPROL-XL) 25 MG 24 hr tablet TAKE ONE-HALF OF A TABLET BY MOUTH DAILY 90 tablet 2  . omeprazole (PRILOSEC) 40 MG capsule TAKE 1 CAPSULE BY MOUTH  DAILY 90 capsule 3  . promethazine-codeine (PHENERGAN WITH CODEINE) 6.25-10 MG/5ML syrup Take 5 mLs by mouth every 4 (four) hours as needed. 240 mL 0  . simvastatin (ZOCOR) 20 MG tablet  TAKE 1 TABLET BY MOUTH  DAILY 90 tablet 3   No current facility-administered medications for this visit.    Social History   Socioeconomic History  . Marital status: Married    Spouse name: Not on file  . Number of children: 3  . Years of education: 12th  . Highest education level: Not on file  Occupational History  . Occupation: Retired, [place] [employer]    Employer: RETIRED    Comment: Supervisor  Tobacco Use  . Smoking status: Former Smoker    Packs/day: 1.00    Years: 40.00    Pack years: 40.00    Quit date: 01/16/2001  Years since quitting: 18.4  . Smokeless tobacco: Never Used  . Tobacco comment: Quit 2002  Substance and Sexual Activity  . Alcohol use: Yes    Alcohol/week: 0.0 standard drinks    Comment: occasional drink 2-3 a month  . Drug use: No    Comment: quit smoking 2002  . Sexual activity: Yes  Other Topics Concern  . Not on file  Social History Narrative   Daily Caffeine - 1      Social Determinants of Health   Financial Resource Strain:   . Difficulty of Paying Living Expenses:   Food Insecurity:   . Worried About Charity fundraiser in the Last Year:   . Arboriculturist in the Last Year:   Transportation Needs:   . Film/video editor (Medical):   Marland Kitchen Lack of Transportation (Non-Medical):   Physical Activity:   . Days of Exercise per Week:   . Minutes of Exercise per Session:   Stress:   . Feeling of Stress :   Social Connections:   . Frequency of Communication with Friends and Family:   . Frequency of Social Gatherings with Friends and Family:   . Attends Religious Services:   . Active Member of Clubs or Organizations:   . Attends Archivist Meetings:   Marland Kitchen Marital Status:   Intimate Partner Violence:   . Fear of Current or Ex-Partner:   . Emotionally Abused:   Marland Kitchen Physically Abused:   . Sexually Abused:     Family History  Problem Relation Age of Onset  . Prostate cancer Father        61's  . Diabetes Father    . Stroke Mother        aneurism  . Stroke Maternal Grandfather 35  . Prostate cancer Brother 50  . Colon polyps Brother   . Prostate cancer Brother 65  . Colon cancer Sister   . Coronary artery disease Neg Hx   . Hypertension Neg Hx   . Stomach cancer Neg Hx   . Rectal cancer Neg Hx     ROS General: Negative; No fevers, chills, or night sweats;  HEENT: Negative; No changes in vision or hearing, sinus congestion, difficulty swallowing Pulmonary: Negative; No cough, wheezing, shortness of breath, hemoptysis Cardiovascular: see HPI GI: Remote history of esophageal cancer, status post chemotherapy and radiation treatment GU: Recently evaluated by [name] Musculoskeletal: Negative; no myalgias, joint pain, or weakness Hematologic/Oncology: Negative; no easy bruising, bleeding Endocrine: Positive for hypothyroidism on Synthroid replacement Neuro: Negative; no changes in balance, headaches Skin: Negative; No rashes or skin lesions Psychiatric: Negative; No behavioral problems, depression Sleep: Negative; No snoring, daytime sleepiness, hypersomnolence, bruxism, restless legs, hypnogognic hallucinations, no cataplexy Other comprehensive 14 point system review is negative.  PE BP 120/74 (BP Location: Left Arm, Patient Position: Sitting, Cuff Size: Normal)   Pulse 81   Temp (!) 95.9 F (35.5 C)   Ht 6' 3"  (1.905 m)   Wt 210 lb (95.3 kg)   BMI 26.25 kg/m    Repeat blood pressure by me was 106/70 supine and 110/70 standing  Wt Readings from Last 3 Encounters:  06/10/19 210 lb (95.3 kg)  04/09/19 205 lb (93 kg)  01/07/19 198 lb 9.6 oz (90.1 kg)   General: Alert, oriented, no distress.  Skin: normal turgor, no rashes, warm and dry HEENT: Normocephalic, atraumatic. Pupils equal round and reactive to light; sclera anicteric; extraocular muscles intact;  Nose without nasal septal hypertrophy Mouth/Parynx  benign; Mallinpatti scale 3 Neck: No JVD, no carotid bruits; normal carotid  upstroke Lungs: clear to ausculatation and percussion; no wheezing or rales Chest wall: without tenderness to palpitation Heart: PMI not displaced, RRR, s1 s2 normal, 1/6 systolic murmur, 1/6 diastolic murmur, no rubs, gallops, thrills, or heaves Abdomen: soft, nontender; no hepatosplenomehaly, BS+; abdominal aorta nontender and not dilated by palpation. Back: no CVA tenderness Pulses 2+ Musculoskeletal: full range of motion, normal strength, no joint deformities Extremities: no clubbing cyanosis or edema, Homan's sign negative  Neurologic: grossly nonfocal; Cranial nerves grossly wnl Psychologic: Normal mood and affect   ECG (independently read by me): Sinus rhythm at 81 bpm with an isolated PVC normal intervals    September 2020 ECG (independently read by me): Sinus rhythm at 95 bpm with occasional PVCs with right bundle morphology; normal intervals.  September 2019 ECG (independently read by me): Normal sinus rhythm at 76 bpm.  One isolated PVC.  No ST segment changes.  Normal intervals.  June 2019 ECG (independently read by me): Normal sinus rhythm at 79 bpm.  No ectopy.  No ST segment changes.  May 2017 ECG (independently read by me): Normal sinus rhythm at 76 bpm.  PR interval 176 ms.  QTc interval 411 milliseconds.  No ST segment changes.  September 2016 ECG (independently read by me): Normal sinus rhythm at 87 bpm.  No ectopy.  Normal intervals.  No ST segment changes.  12/17/2013 ECG (independently read by me): Normal sinus rhythm at 65 beats per minute.  Normal intervals.  No ST segment changes.  Prior September 2014 ECG: Normal sinus rhythm at 60 beats per minute. Intervals are normal.  LABS:  BMP Latest Ref Rng & Units 04/09/2019 10/09/2018 04/10/2018  Glucose 70 - 99 mg/dL 108(H) 112(H) 80  BUN 6 - 23 mg/dL 17 12 18   Creatinine 0.40 - 1.50 mg/dL 1.58(H) 1.54(H) 1.43  Sodium 135 - 145 mEq/L 138 137 137  Potassium 3.5 - 5.1 mEq/L 3.9 4.2 3.6  Chloride 96 - 112 mEq/L 100  100 101  CO2 19 - 32 mEq/L 29 30 29   Calcium 8.4 - 10.5 mg/dL 10.3 9.9 9.7   Hepatic Function Latest Ref Rng & Units 11/29/2016 08/18/2015 08/05/2014  Total Protein 6.0 - 8.3 g/dL 7.8 7.4 7.8  Albumin 3.5 - 5.2 g/dL 4.5 4.2 4.1  AST 0 - 37 U/L 25 28 24   ALT 0 - 53 U/L 13 14 13   Alk Phosphatase 39 - 117 U/L 77 67 82  Total Bilirubin 0.2 - 1.2 mg/dL 0.4 0.4 0.4  Bilirubin, Direct 0.0 - 0.3 mg/dL 0.1 - 0.1   CBC Latest Ref Rng & Units 10/09/2018 04/04/2017 08/18/2015  WBC 4.0 - 10.5 K/uL 4.0 3.9(L) 3.7(L)  Hemoglobin 13.0 - 17.0 g/dL 14.7 14.5 13.8  Hematocrit 39.0 - 52.0 % 44.1 44.1 42.6  Platelets 150.0 - 400.0 K/uL 170.0 178.0 217   Lab Results  Component Value Date   MCV 91.4 10/09/2018   MCV 90.0 04/04/2017   MCV 89.1 08/18/2015   Lab Results  Component Value Date   TSH 3.74 04/09/2019   Lab Results  Component Value Date   HGBA1C 6.8 (H) 04/09/2019   Lipid Panel     Component Value Date/Time   CHOL 153 10/09/2018 1032   CHOL 150 12/05/2017 0942   TRIG 130.0 10/09/2018 1032   TRIG 120 02/26/2006 1420   HDL 49.80 10/09/2018 1032   HDL 57 12/05/2017 0942   CHOLHDL 3 10/09/2018 1032  VLDL 26.0 10/09/2018 1032   LDLCALC 77 10/09/2018 1032   LDLCALC 74 12/05/2017 0942   IMPRESSION:  1. Coronary artery disease involving native coronary artery of native heart without angina pectoris   2. Hypothyroidism, unspecified type   3. Palpitations   4. Hyperlipidemia with target LDL less than 70   5. PSA elevation   6. History of esophageal cancer   7. Nonrheumatic aortic valve insufficiency     ASSESSMENT AND [name]: Mr. Situ is an 84 year-old African-American male who has documented mild CAD by cardiac catheterization in June 2010 and has remained stable on medical therapy.  He denies any recurrent anginal symptomatology and has continued to be active working in his yard and often carrying a backpack blower weighing 75 pounds on his back and blowing leaves at times for up to  an hour.  When I last evaluated him, he had complaints of increased sense of fast heartbeats with palpitations.  I initiated Toprol-XL 12.5 mg.  His palpitations have essentially resolved.  ECG today is stable.  Heart rate has improved from upper 90s to 81.  His blood pressure today is stable on metoprolol succinate 12.5 mg and heart rate is around 80.  Echo Doppler study has shown normal systolic function with mild diastolic dysfunction, mild to moderate AR and mild TR.  Continues to be on levothyroxine 125 mcg for hypothyroidism.  He continues to tolerate simvastatin 20 mg for hyperlipidemia with target LDL less than 70.  LDL cholesterol in July 2020 was 77.  He sees [name] will be rechecking laboratory.  He has stage III chronic kidney disease with serum creatinine 1.58 on April 09, 2019.  He is recently seen Aaron [name] in follow-up of his esophageal cancer history and esophageal stricture.  Continues to see Aaron [name] for his prostate issues.  As long as he is stable I will see him in 1 year for reevaluation.    Troy Sine, MD, [facility]  06/10/2019 6:33 PM

## 2019-07-08 ENCOUNTER — Ambulatory Visit: Payer: Medicare Other | Admitting: Internal Medicine

## 2019-07-08 DIAGNOSIS — Z0289 Encounter for other administrative examinations: Secondary | ICD-10-CM

## 2019-07-24 ENCOUNTER — Other Ambulatory Visit: Payer: Self-pay

## 2019-07-24 ENCOUNTER — Ambulatory Visit (INDEPENDENT_AMBULATORY_CARE_PROVIDER_SITE_OTHER): Payer: Medicare Other

## 2019-07-24 VITALS — BP 116/70 | HR 90 | Temp 98.1°F | Resp 16 | Ht 75.0 in | Wt 205.8 lb

## 2019-07-24 DIAGNOSIS — Z0000 Encounter for general adult medical examination without abnormal findings: Secondary | ICD-10-CM

## 2019-07-24 NOTE — Patient Instructions (Addendum)
Aaron [name] , Thank you for taking time to come for your Medicare Wellness Visit. I appreciate your ongoing commitment to your health goals. Please review the following plan we discussed and let me know if I can assist you in the future.   Screening recommendations/referrals: Colorectal Screening: 01/31/2012  Vision and Dental Exams: Recommended annual ophthalmology exams for early detection of glaucoma and other disorders of the eye Recommended annual dental exams for proper oral hygiene  Vaccinations: Influenza vaccine: 11/16/2018 Pneumococcal vaccine: completed; Prevnar 01/22/2013, Pneumovax 09/01/2015 Tdap vaccine: 03/29/2016; Due every 10 years Shingles vaccine: Please call your insurance company to determine your out of pocket expense for the Shingrix vaccine. You may receive this vaccine at your local pharmacy. Covid vaccine: completed; [place]  Advanced directives: Advance directives discussed with you today.Please bring a copy of your POA (Power of [place]) and/or Living Will to your next appointment.  Goals:  Recommend to drink at least 6-8 8oz glasses of water per day.  Recommend to exercise for at least 150 minutes per week.  Recommend to remove any items from the home that may cause slips or trips.  Recommend to decrease portion sizes by eating 3 small healthy meals and at least 2 healthy snacks per day.  Recommend to begin DASH diet as directed below  Recommend to continue efforts to reduce smoking habits until no longer smoking. Smoking Cessation literature is attached below.  Next appointment: Please schedule your Annual Wellness Visit with your Nurse Health Advisor in one year.  Preventive Care 82 Years and Older, Male Preventive care refers to lifestyle choices and visits with your health care provider that can promote health and wellness. What does preventive care include?  A yearly physical exam. This is also called an annual well check.  Dental exams once or  twice a year.  Routine eye exams. Ask your health care provider how often you should have your eyes checked.  Personal lifestyle choices, including:  Daily care of your teeth and gums.  Regular physical activity.  Eating a healthy diet.  Avoiding tobacco and drug use.  Limiting alcohol use.  Practicing safe sex.  Taking low doses of aspirin every day if recommended by your health care provider..  Taking vitamin and mineral supplements as recommended by your health care provider. What happens during an annual well check? The services and screenings done by your health care provider during your annual well check will depend on your age, overall health, lifestyle risk factors, and family history of disease. Counseling  Your health care provider may ask you questions about your:  Alcohol use.  Tobacco use.  Drug use.  Emotional well-being.  Home and relationship well-being.  Sexual activity.  Eating habits.  History of falls.  Memory and ability to understand (cognition).  Work and work Statistician. Screening  You may have the following tests or measurements:  Height, weight, and BMI.  Blood pressure.  Lipid and cholesterol levels. These may be checked every 5 years, or more frequently if you are over 65 years old.  Skin check.  Lung cancer screening. You may have this screening every year starting at age 82 if you have a 30-pack-year history of smoking and currently smoke or have quit within the past 15 years.  Fecal occult blood test (FOBT) of the stool. You may have this test every year starting at age 82.  Flexible sigmoidoscopy or colonoscopy. You may have a sigmoidoscopy every 5 years or a colonoscopy every 10 years starting at  age 82.  Prostate cancer screening. Recommendations will vary depending on your family history and other risks.  Hepatitis C blood test.  Hepatitis B blood test.  Sexually transmitted disease (STD) testing.  Diabetes  screening. This is done by checking your blood sugar (glucose) after you have not eaten for a while (fasting). You may have this done every 1-3 years.  Abdominal aortic aneurysm (AAA) screening. You may need this if you are a current or former smoker.  Osteoporosis. You may be screened starting at age 82 if you are at high risk. Talk with your health care provider about your test results, treatment options, and if necessary, the need for more tests. Vaccines  Your health care provider may recommend certain vaccines, such as:  Influenza vaccine. This is recommended every year.  Tetanus, diphtheria, and acellular pertussis (Tdap, Td) vaccine. You may need a Td booster every 10 years.  Zoster vaccine. You may need this after age 82.  Pneumococcal 13-valent conjugate (PCV13) vaccine. One dose is recommended after age 82.  Pneumococcal polysaccharide (PPSV23) vaccine. One dose is recommended after age 82. Talk to your health care provider about which screenings and vaccines you need and how often you need them. This information is not intended to replace advice given to you by your health care provider. Make sure you discuss any questions you have with your health care provider. Document Released: 04/08/2015 Document Revised: 11/30/2015 Document Reviewed: 01/11/2015 Elsevier Interactive Patient Education  2017 [place] Prevention in the Home Falls can cause injuries. They can happen to people of all ages. There are many things you can do to make your home safe and to help prevent falls. What can I do on the outside of my home?  Regularly fix the edges of walkways and driveways and fix any cracks.  Remove anything that might make you trip as you walk through a door, such as a raised step or threshold.  Trim any bushes or trees on the path to your home.  Use bright outdoor lighting.  Clear any walking paths of anything that might make someone trip, such as rocks or  tools.  Regularly check to see if handrails are loose or broken. Make sure that both sides of any steps have handrails.  Any raised decks and porches should have guardrails on the edges.  Have any leaves, snow, or ice cleared regularly.  Use sand or salt on walking paths during winter.  Clean up any spills in your garage right away. This includes oil or grease spills. What can I do in the bathroom?  Use night lights.  Install grab bars by the toilet and in the tub and shower. Do not use towel bars as grab bars.  Use non-skid mats or decals in the tub or shower.  If you need to sit down in the shower, use a plastic, non-slip stool.  Keep the floor dry. Clean up any water that spills on the floor as soon as it happens.  Remove soap buildup in the tub or shower regularly.  Attach bath mats securely with double-sided non-slip rug tape.  Do not have throw rugs and other things on the floor that can make you trip. What can I do in the bedroom?  Use night lights.  Make sure that you have a light by your bed that is easy to reach.  Do not use any sheets or blankets that are too big for your bed. They should not hang down onto the  floor.  Have a firm chair that has side arms. You can use this for support while you get dressed.  Do not have throw rugs and other things on the floor that can make you trip. What can I do in the kitchen?  Clean up any spills right away.  Avoid walking on wet floors.  Keep items that you use a lot in easy-to-reach places.  If you need to reach something above you, use a strong step stool that has a grab bar.  Keep electrical cords out of the way.  Do not use floor polish or wax that makes floors slippery. If you must use wax, use non-skid floor wax.  Do not have throw rugs and other things on the floor that can make you trip. What can I do with my stairs?  Do not leave any items on the stairs.  Make sure that there are handrails on both  sides of the stairs and use them. Fix handrails that are broken or loose. Make sure that handrails are as long as the stairways.  Check any carpeting to make sure that it is firmly attached to the stairs. Fix any carpet that is loose or worn.  Avoid having throw rugs at the top or bottom of the stairs. If you do have throw rugs, attach them to the floor with carpet tape.  Make sure that you have a light switch at the top of the stairs and the bottom of the stairs. If you do not have them, ask someone to add them for you. What else can I do to help prevent falls?  Wear shoes that:  Do not have high heels.  Have rubber bottoms.  Are comfortable and fit you well.  Are closed at the toe. Do not wear sandals.  If you use a stepladder:  Make sure that it is fully opened. Do not climb a closed stepladder.  Make sure that both sides of the stepladder are locked into place.  Ask someone to hold it for you, if possible.  Clearly mark and make sure that you can see:  Any grab bars or handrails.  First and last steps.  Where the edge of each step is.  Use tools that help you move around (mobility aids) if they are needed. These include:  Canes.  Walkers.  Scooters.  Crutches.  Turn on the lights when you go into a dark area. Replace any light bulbs as soon as they burn out.  Set up your furniture so you have a clear path. Avoid moving your furniture around.  If any of your floors are uneven, fix them.  If there are any pets around you, be aware of where they are.  Review your medicines with your doctor. Some medicines can make you feel dizzy. This can increase your chance of falling. Ask your doctor what other things that you can do to help prevent falls. This information is not intended to replace advice given to you by your health care provider. Make sure you discuss any questions you have with your health care provider. Document Released: 01/06/2009 Document Revised:  08/18/2015 Document Reviewed: 04/16/2014 Elsevier Interactive Patient Education  2017 [employer].

## 2019-07-24 NOTE — Progress Notes (Signed)
Subjective:   Aaron [name] is a 84 y.o. male who presents for Medicare Annual/Subsequent preventive examination.  Review of Systems:  No ROS Medicare Wellness Visit Cardiac Risk Factors include: advanced age (>12men, >9 women);dyslipidemia;hypertension;male gender Sleep Patterns: No issues with falling asleep; sleeps good; feels rested on waking on 8 hours of sleep nightly. Home Safety/Smoke Alarms: Feels safe in home; Smoke alarms in place. Living environment: [place] home; Lives with his wife. NO DME needed; good support system. Seat Belt Safety/Bike Helmet: Wears seat belt.    Objective:    Vitals: BP 116/70 (BP Location: Left Arm, Patient Position: Sitting, Cuff Size: Normal)   Pulse 90   Temp 98.1 F (36.7 C)   Resp 16   Ht 6\' 3"  (1.905 m)   Wt 205 lb 12.8 oz (93.4 kg)   SpO2 99%   BMI 25.72 kg/m   Body mass index is 25.72 kg/m.  Advanced Directives 07/24/2019 05/03/2016 04/28/2015 10/03/2011  Does Patient Have a Medical Advance Directive? Yes No No Patient does not have advance directive  Type of Scientist, forensic Power of [place];Living will - - -  Does patient want to make changes to medical advance directive? No - Patient declined - - -  Copy of [place] in Chart? No - copy requested - - -  Would patient like information on creating a medical advance directive? - - No - patient declined information -    Tobacco Social History   Tobacco Use  Smoking Status Former Smoker  . Packs/day: 1.00  . Years: 40.00  . Pack years: 40.00  . Quit date: 01/16/2001  . Years since quitting: 18.5  Smokeless Tobacco Never Used  Tobacco Comment   Quit 2002     Counseling given: No Comment: Quit 2002   Clinical Intake:  Pre-visit preparation completed: Yes  Pain : No/denies pain Pain Score: 0-No pain     BMI - recorded: 25.7 Nutritional Status: BMI 25 -29 Overweight Nutritional Risks: None Diabetes: No  How often do you need  to have someone help you when you read instructions, pamphlets, or other written materials from your doctor or pharmacy?: 1 - Never What is the last grade level you completed in school?: High School Graduate  Interpreter Needed?: No  Information entered by :: Deontay Ladnier N. [place] Guitar, LPN  Past Medical History:  Diagnosis Date  . Allergic rhinitis   . Allergy   . Anxiety   . Arrhythmia   . CAD (coronary artery disease)    mild  . COPD (chronic obstructive pulmonary disease) ([place])   . Diverticulosis of colon   . Elevated glucose   . 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 Esophageal stricture   . Gastritis   . GERD (gastroesophageal reflux disease)   . Hemorrhoids   . Hiatal hernia   . History of chemotherapy 05/03/09-06/16/09   Taxol/carboplatin  . History of nuclear stress test 07/2008   exercise; mild-mod perfusion dfect in basal inf/mid inf/apical inf regions; EKG negative for ischemia  . History of radiation therapy 05/03/09-06/16/09   squamous cell ca of esophagus  . Hyperlipidemia    [name]  . Hypothyroid    s/p radiotherapy   Past Surgical History:  Procedure Laterality Date  . CARDIAC CATHETERIZATION  08/2008   mild nonobstructive CAD with 30% LAD stenosis prox & 20% OM1 narrowing   . COLONOSCOPY    .  CREATION OF CUTANEOUS STOMA  2011  . ESOPHAGOGASTRODUODENOSCOPY  10/03/2011   Procedure: ESOPHAGOGASTRODUODENOSCOPY (EGD);  Surgeon: [name], MD,FACG;  Location: [name];  Service: Endoscopy;  Laterality: N/A;  no fluro needed  . Lower Back Cyst Removed     [facility]  . POLYPECTOMY    . [name] DILATION  10/03/2011   Procedure: [name] DILATION;  Surgeon: [name], MD,FACG;  Location: [facility];  Service: Endoscopy;  Laterality: N/A;  . TONSILLECTOMY     age 41  . TRANSTHORACIC ECHOCARDIOGRAM  06/2009   EF=>55%; trace MR/TR  . UPPER GASTROINTESTINAL ENDOSCOPY     Family History  Problem Relation  Age of Onset  . Prostate cancer Father        52's  . Diabetes Father   . Stroke Mother        aneurism  . Stroke Maternal Grandfather 35  . Prostate cancer Brother 19  . Colon polyps Brother   . Prostate cancer Brother 59  . Colon cancer Sister   . Coronary artery disease Neg Hx   . Hypertension Neg Hx   . Stomach cancer Neg Hx   . Rectal cancer Neg Hx    Social History   Socioeconomic History  . Marital status: Married    Spouse name: Not on file  . Number of children: 3  . Years of education: 12th  . Highest education level: Not on file  Occupational History  . Occupation: Retired, [place] [employer]    Employer: RETIRED    Comment: Supervisor  Tobacco Use  . Smoking status: Former Smoker    Packs/day: 1.00    Years: 40.00    Pack years: 40.00    Quit date: 01/16/2001    Years since quitting: 18.5  . Smokeless tobacco: Never Used  . Tobacco comment: Quit 2002  Substance and Sexual Activity  . Alcohol use: Yes    Alcohol/week: 0.0 standard drinks    Comment: occasional drink 2-3 a month  . Drug use: No    Comment: quit smoking 2002  . Sexual activity: Yes  Other Topics Concern  . Not on file  Social History Narrative   Daily Caffeine - 1      Social Determinants of Health   Financial Resource Strain:   . Difficulty of Paying Living Expenses:   Food Insecurity:   . Worried About Charity fundraiser in the Last Year:   . Arboriculturist in the Last Year:   Transportation Needs:   . Film/video editor (Medical):   Marland Kitchen Lack of Transportation (Non-Medical):   Physical Activity:   . Days of Exercise per Week:   . Minutes of Exercise per Session:   Stress:   . Feeling of Stress :   Social Connections:   . Frequency of Communication with Friends and Family:   . Frequency of Social Gatherings with Friends and Family:   . Attends Religious Services:   . Active Member of Clubs or Organizations:   . Attends Archivist Meetings:   Marland Kitchen  Marital Status:     Outpatient Encounter Medications as of 07/24/2019  Medication Sig  . aspirin 81 MG EC tablet Take 81 mg by mouth daily.    . CHOLECALCIFEROL PO Take 1,000 Units by mouth daily.  . clonazePAM (KLONOPIN) 1 MG tablet TAKE 1/2 TO 1 TABLET BY  MOUTH 2 TIMES DAILY AS  NEEDED FOR ANXIETY  . clotrimazole-betamethasone (LOTRISONE) cream Apply topically  2 (two) times daily.  Marland Kitchen Dextromethorphan-guaiFENesin (MUCUS RELIEF DM COUGH) 20-400 MG TABS Take 1 tablet by mouth daily as needed.   . finasteride (PROSCAR) 5 MG tablet Take 1 tablet (5 mg total) by mouth daily.  Marland Kitchen levothyroxine (SYNTHROID) 125 MCG tablet TAKE 1 TABLET BY MOUTH  DAILY  . metoprolol succinate (TOPROL-XL) 25 MG 24 hr tablet TAKE ONE-HALF OF A TABLET BY MOUTH DAILY  . omeprazole (PRILOSEC) 40 MG capsule TAKE 1 CAPSULE BY MOUTH  DAILY  . promethazine-codeine (PHENERGAN WITH CODEINE) 6.25-10 MG/5ML syrup Take 5 mLs by mouth every 4 (four) hours as needed.  . simvastatin (ZOCOR) 20 MG tablet TAKE 1 TABLET BY MOUTH  DAILY   No facility-administered encounter medications on file as of 07/24/2019.    Activities of Daily Living In your present state of health, do you have any difficulty performing the following activities: 07/24/2019  Hearing? N  Vision? N  Difficulty concentrating or making decisions? N  Walking or climbing stairs? N  Dressing or bathing? N  Doing errands, shopping? N  Preparing Food and eating ? N  Using the Toilet? N  In the past six months, have you accidently leaked urine? N  Do you have problems with loss of bowel control? N  Managing your Medications? N  Managing your Finances? N  Housekeeping or managing your Housekeeping? N  Some recent data might be hidden    Patient Care Team: Plotnikov, Evie Lacks, MD as PCP - General Tyler Pita, MD (Radiation Oncology) Ladell Pier, MD as Attending Physician (Hematology and Oncology) [name], MD (Gastroenterology) Irine Seal, MD  as Attending Physician (Urology)   Assessment:   This is a routine wellness examination for [place].  Exercise Activities and Dietary recommendations Current Exercise Habits: The patient does not participate in regular exercise at present(very active in yard work and gardening), Exercise limited by: None identified  Goals    . Client understands the importance of follow-up with providers by attending scheduled visits    . DIET - REDUCE SUGAR INTAKE    . Patient Stated     To stay active and healthy       Fall Risk Fall Risk  07/24/2019 02/18/2019 10/24/2017 10/22/2016 08/30/2016  Falls in the past year? 0 0 No No No  Comment - Emmi Telephone Survey: data to providers prior to load - Emmi Telephone Survey: data to providers prior to load -  Number falls in past yr: 0 - - - -  Injury with Fall? 0 - - - -  Risk for fall due to : No Fall Risks - - - -  Follow up Falls evaluation completed;Education provided;Falls prevention discussed - - - -   Is the patient's home free of loose throw rugs in walkways, pet beds, electrical cords, etc?   yes      Grab bars in the bathroom? yes      Handrails on the stairs?   yes      Adequate lighting?   yes   Depression Screen PHQ 2/9 Scores 07/24/2019 10/24/2017 08/30/2016 01/31/2015  PHQ - 2 Score 0 0 0 0    Cognitive Function     6CIT Screen 07/24/2019  What Year? 0 points  What month? 0 points  What time? 0 points  Count back from 20 0 points  Months in reverse 0 points  Repeat phrase 2 points  Total Score 2    Immunization History  Administered Date(s) Administered  . Influenza  Split 12/27/2010, 12/13/2011  . Influenza Whole 02/04/2004, 12/25/2007, 01/04/2010  . Influenza, High Dose Seasonal PF 01/01/2015, 12/29/2015, 11/29/2016, 01/07/2018  . Influenza, Seasonal, Injecte, Preservative Fre 12/19/2012  . Influenza-Unspecified 12/23/2013  . Pneumococcal Conjugate-13 01/22/2013  . Pneumococcal Polysaccharide-23 02/09/2004, 03/04/2008,  09/01/2015  . Td 08/26/2006  . Tdap 03/29/2016    Qualifies for Shingles Vaccine? Yes  Screening Tests Health Maintenance  Topic Date Due  . COVID-19 Vaccine (1) Never done  . INFLUENZA VACCINE  10/25/2019  . TETANUS/TDAP  03/29/2026  . PNA vac Low Risk Adult  Completed   Cancer Screenings: Lung: Low Dose CT Chest recommended if Age 61-80 years, 30 pack-year currently smoking OR have quit w/in 15years. Patient does not qualify. Colorectal: Yes     Plan:     Reviewed health maintenance screenings with patient today and relevant education, vaccines, and/or referrals were provided.    Continue doing brain stimulating activities (puzzles, reading, adult coloring books, staying active) to keep memory sharp.    Continue to eat heart healthy diet (full of fruits, vegetables, whole grains, lean protein, water--limit salt, fat, and sugar intake) and increase physical activity as tolerated.  I have personally reviewed and noted the following in the patient's chart:   . Medical and social history . Use of alcohol, tobacco or illicit drugs  . Current medications and supplements . Functional ability and status . Nutritional status . Physical activity . Advanced directives . List of other physicians . Hospitalizations, surgeries, and ER visits in previous 12 months . Vitals . Screenings to include cognitive, depression, and falls . Referrals and appointments  In addition, I have reviewed and discussed with patient certain preventive protocols, quality metrics, and best practice recommendations. A written personalized care plan for preventive services as well as general preventive health recommendations were provided to patient.     Sheral Flow, LPN  [id number]  Nurse Health Advisor

## 2019-07-30 DIAGNOSIS — H25013 Cortical age-related cataract, bilateral: Secondary | ICD-10-CM | POA: Diagnosis not present

## 2019-07-30 DIAGNOSIS — H401211 Low-tension glaucoma, right eye, mild stage: Secondary | ICD-10-CM | POA: Diagnosis not present

## 2019-07-30 DIAGNOSIS — H2513 Age-related nuclear cataract, bilateral: Secondary | ICD-10-CM | POA: Diagnosis not present

## 2019-07-30 DIAGNOSIS — H04123 Dry eye syndrome of bilateral lacrimal glands: Secondary | ICD-10-CM | POA: Diagnosis not present

## 2019-07-30 DIAGNOSIS — H40022 Open angle with borderline findings, high risk, left eye: Secondary | ICD-10-CM | POA: Diagnosis not present

## 2019-08-20 ENCOUNTER — Encounter: Payer: Self-pay | Admitting: Internal Medicine

## 2019-08-20 ENCOUNTER — Ambulatory Visit (INDEPENDENT_AMBULATORY_CARE_PROVIDER_SITE_OTHER): Payer: Medicare Other | Admitting: Internal Medicine

## 2019-08-20 DIAGNOSIS — H6123 Impacted cerumen, bilateral: Secondary | ICD-10-CM | POA: Diagnosis not present

## 2019-08-20 DIAGNOSIS — I2510 Atherosclerotic heart disease of native coronary artery without angina pectoris: Secondary | ICD-10-CM

## 2019-08-20 NOTE — Progress Notes (Signed)
Subjective:  Patient ID: Aaron [name], male    DOB: 1935/09/30  Age: 84 y.o. MRN: [id number]  CC: No chief complaint on file.   HPI Aaron [name] presents for a c/o decreased hearing - weeks  Outpatient Medications Prior to Visit  Medication Sig Dispense Refill  . aspirin 81 MG EC tablet Take 81 mg by mouth daily.      . CHOLECALCIFEROL PO Take 1,000 Units by mouth daily.    . clonazePAM (KLONOPIN) 1 MG tablet TAKE 1/2 TO 1 TABLET BY  MOUTH 2 TIMES DAILY AS  NEEDED FOR ANXIETY 180 tablet 1  . clotrimazole-betamethasone (LOTRISONE) cream Apply topically 2 (two) times daily. 15 g 2  . Dextromethorphan-guaiFENesin (MUCUS RELIEF DM COUGH) 20-400 MG TABS Take 1 tablet by mouth daily as needed.     . finasteride (PROSCAR) 5 MG tablet Take 1 tablet (5 mg total) by mouth daily. 90 tablet 3  . levothyroxine (SYNTHROID) 125 MCG tablet TAKE 1 TABLET BY MOUTH  DAILY 90 tablet 3  . metoprolol succinate (TOPROL-XL) 25 MG 24 hr tablet TAKE ONE-HALF OF A TABLET BY MOUTH DAILY 90 tablet 2  . omeprazole (PRILOSEC) 40 MG capsule TAKE 1 CAPSULE BY MOUTH  DAILY 90 capsule 3  . promethazine-codeine (PHENERGAN WITH CODEINE) 6.25-10 MG/5ML syrup Take 5 mLs by mouth every 4 (four) hours as needed. 240 mL 0  . simvastatin (ZOCOR) 20 MG tablet TAKE 1 TABLET BY MOUTH  DAILY 90 tablet 3   No facility-administered medications prior to visit.    ROS: Review of Systems  Constitutional: Negative for unexpected weight change.  HENT: Positive for hearing loss. Negative for congestion, ear pain and facial swelling.     Objective:  BP 104/70   Pulse 89   Temp 98.4 F (36.9 C) (Oral)   Ht 6\' 3"  (1.905 m)   Wt 205 lb (93 kg)   SpO2 96%   BMI 25.62 kg/m   BP Readings from Last 3 Encounters:  08/20/19 104/70  07/24/19 116/70  06/10/19 120/74    Wt Readings from Last 3 Encounters:  08/20/19 205 lb (93 kg)  07/24/19 205 lb 12.8 oz (93.4 kg)  06/10/19 210 lb (95.3 kg)    Physical  Exam Constitutional:      General: He is not in acute distress.    Appearance: He is well-developed.     Comments: NAD  HENT:     Right Ear: Tympanic membrane and external ear normal. There is impacted cerumen.     Left Ear: Tympanic membrane and external ear normal. There is impacted cerumen.  Eyes:     Conjunctiva/sclera: Conjunctivae normal.     Pupils: Pupils are equal, round, and reactive to light.  Neck:     Thyroid: No thyromegaly.     Vascular: No JVD.  Cardiovascular:     Rate and Rhythm: Normal rate and regular rhythm.     Heart sounds: Normal heart sounds. No murmur. No friction rub. No gallop.   Pulmonary:     Effort: Pulmonary effort is normal. No respiratory distress.     Breath sounds: Normal breath sounds. No wheezing or rales.  Chest:     Chest wall: No tenderness.  Abdominal:     General: Bowel sounds are normal. There is no distension.     Palpations: Abdomen is soft. There is no mass.     Tenderness: There is no abdominal tenderness. There is no guarding or rebound.  Musculoskeletal:  General: No tenderness. Normal range of motion.     Cervical back: Normal range of motion.  Lymphadenopathy:     Cervical: No cervical adenopathy.  Skin:    General: Skin is warm and dry.     Findings: No rash.  Neurological:     Mental Status: He is alert and oriented to person, place, and time.     Cranial Nerves: No cranial nerve deficit.     Motor: No abnormal muscle tone.     Coordination: Coordination normal.     Gait: Gait normal.     Deep Tendon Reflexes: Reflexes are normal and symmetric.  Psychiatric:        Behavior: Behavior normal.        Thought Content: Thought content normal.        Judgment: Judgment normal.    B wax   Procedure Note :     Procedure :  Ear irrigation right and left ears   Indication:  Cerumen impaction right and left ears   Risks, including pain, dizziness, eardrum perforation, bleeding, infection and others as well as  benefits were explained to the patient in detail. Verbal consent was obtained and the patient agreed to proceed.    We used "The Elephant Ear Irrigation Device" filled with lukewarm water for irrigation. A large amount wax was recovered from both ears. Procedure has also required manual wax removal/instrumentation with an ear wax curette and ear forceps on the right and left ears.   Tolerated well. Complications: None.   Postprocedure instructions :  Call if problems.   Lab Results  Component Value Date   WBC 4.0 10/09/2018   HGB 14.7 10/09/2018   HCT 44.1 10/09/2018   PLT 170.0 10/09/2018   GLUCOSE 108 (H) 04/09/2019   CHOL 153 10/09/2018   TRIG 130.0 10/09/2018   HDL 49.80 10/09/2018   LDLCALC 77 10/09/2018   ALT 13 11/29/2016   AST 25 11/29/2016   NA 138 04/09/2019   K 3.9 04/09/2019   CL 100 04/09/2019   CREATININE 1.58 (H) 04/09/2019   BUN 17 04/09/2019   CO2 29 04/09/2019   TSH 3.74 04/09/2019   PSA 16.38 (H) 10/09/2018   INR 1.19 04/28/2009   HGBA1C 6.8 (H) 04/09/2019    DG Chest 2 View  Result Date: 07/04/2017 CLINICAL DATA:  Cough and congestion with wheezing EXAM: CHEST - 2 VIEW COMPARISON:  September 24, 2011 FINDINGS: There is slight bibasilar atelectasis. No edema or consolidation. Heart size and pulmonary vascularity are normal. No adenopathy. No bone lesions. IMPRESSION: Slight bibasilar atelectasis.  No evident edema or consolidation. Electronically Signed   By: Lowella Grip III M.D.   On: 07/04/2017 14:05    Assessment & Plan:   Walker Kehr, MD

## 2019-08-20 NOTE — Assessment & Plan Note (Signed)
Will irrigate - see prcedure

## 2019-08-20 NOTE — Progress Notes (Signed)
Patient consent obtained. Irrigation with water and peroxide performed. Full view of tympanic membranes after procedure.  Patient tolerated procedure well.   

## 2019-08-21 ENCOUNTER — Telehealth: Payer: Self-pay | Admitting: Cardiovascular Disease

## 2019-08-21 NOTE — Telephone Encounter (Signed)
Pt c/o BP issue: STAT if pt c/o blurred vision, one-sided weakness or slurred speech  1. What are your last 5 BP readings? 2 or 3 weeks ago 116, yesterday 104/70, this morning 124, 106/74  2. Are you having any other symptoms (ex. Dizziness, headache, blurred vision, passed out)? no  3. What is your BP issue? Patient states last night he did not take his metoprolol. He states yesterday he went to see his PCP who cleared out his ears. He states he feel 100% better now. He states his BP was 104/70 yesterday. 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 When he got home his wife checked it and it was 106/74. He states the lower number has stayed in the 70's.

## 2019-08-21 NOTE — Telephone Encounter (Signed)
If his blood pressure is remaining under 110 consistently he can continue to hold metoprolol.  However if his resting pulse is increased and blood pressure consistently is above 110 would favor resumption of low-dose metoprolol

## 2019-08-21 NOTE — Telephone Encounter (Signed)
Pt report his systolic BP has been averaging around 104-116. He report Dr. [name] a year ago started him on Metoprolol 12.5 mg to help raise his BP but wanted to make MD aware he did not take medication last night and morning systolic was [id number]. Nurse informed pt that per chart review, MD prescribed Metoprolol for palpations/episodes of elevated HR and the medication can sometime lower BP vs. increase it. Nurse was unable to get a clear explanation as to what promoted pt to hold medication last night considering BP was 104 and he thought it's used to increased BP. Pt report he was asymptomatic and voiced he feels better since getting his ears cleaned out. Pt state he is questioning if MD feels he should continue to hold medication. Nurse asked if he notice a difference in palpations and elevated HR considering metoprolol was prescribed for those particular symptoms. Pt states he hasn't noticed recent symptoms.  Will route to MD for recommendations.

## 2019-08-25 NOTE — Telephone Encounter (Signed)
Pt updated and state he believe his home BP his not correct. He report every time he go to the fire department to have BP checked, its always reading in the 120's. Pt state he is going to continue with metoprolol for now but if notice a decrease in BP, he will contact our office.

## 2019-09-14 DIAGNOSIS — R972 Elevated prostate specific antigen [PSA]: Secondary | ICD-10-CM | POA: Diagnosis not present

## 2019-09-30 ENCOUNTER — Other Ambulatory Visit: Payer: Self-pay | Admitting: Internal Medicine

## 2019-10-07 ENCOUNTER — Other Ambulatory Visit: Payer: Self-pay

## 2019-10-07 MED ORDER — METOPROLOL SUCCINATE ER 25 MG PO TB24: ORAL_TABLET | ORAL | 2 refills | Status: DC

## 2019-10-08 ENCOUNTER — Other Ambulatory Visit: Payer: Self-pay

## 2019-10-08 ENCOUNTER — Encounter: Payer: Self-pay | Admitting: Internal Medicine

## 2019-10-08 ENCOUNTER — Ambulatory Visit (INDEPENDENT_AMBULATORY_CARE_PROVIDER_SITE_OTHER): Payer: Medicare Other | Admitting: Internal Medicine

## 2019-10-08 VITALS — BP 120/74 | HR 67 | Temp 98.3°F | Ht 75.0 in | Wt 209.0 lb

## 2019-10-08 DIAGNOSIS — I10 Essential (primary) hypertension: Secondary | ICD-10-CM | POA: Diagnosis not present

## 2019-10-08 DIAGNOSIS — D500 Iron deficiency anemia secondary to blood loss (chronic): Secondary | ICD-10-CM | POA: Diagnosis not present

## 2019-10-08 DIAGNOSIS — E559 Vitamin D deficiency, unspecified: Secondary | ICD-10-CM | POA: Diagnosis not present

## 2019-10-08 DIAGNOSIS — R739 Hyperglycemia, unspecified: Secondary | ICD-10-CM

## 2019-10-08 DIAGNOSIS — R972 Elevated prostate specific antigen [PSA]: Secondary | ICD-10-CM

## 2019-10-08 DIAGNOSIS — I2510 Atherosclerotic heart disease of native coronary artery without angina pectoris: Secondary | ICD-10-CM

## 2019-10-08 DIAGNOSIS — E039 Hypothyroidism, unspecified: Secondary | ICD-10-CM | POA: Diagnosis not present

## 2019-10-08 DIAGNOSIS — C155 Malignant neoplasm of lower third of esophagus: Secondary | ICD-10-CM | POA: Diagnosis not present

## 2019-10-08 DIAGNOSIS — N183 Chronic kidney disease, stage 3 unspecified: Secondary | ICD-10-CM | POA: Diagnosis not present

## 2019-10-08 NOTE — Assessment & Plan Note (Addendum)
F/u w/Urology Recent PSA was down to 8 per pt

## 2019-10-08 NOTE — Assessment & Plan Note (Signed)
F/u w/dr Fuller Plan

## 2019-10-08 NOTE — Assessment & Plan Note (Signed)
Levothroid 

## 2019-10-08 NOTE — Addendum Note (Signed)
Addended by: Cresenciano Lick on: 10/08/2019 11:19 AM   Modules accepted: Orders

## 2019-10-08 NOTE — Assessment & Plan Note (Signed)
Hydrate well Garden early

## 2019-10-08 NOTE — Assessment & Plan Note (Signed)
Labs

## 2019-10-08 NOTE — Assessment & Plan Note (Signed)
CBC

## 2019-10-08 NOTE — Progress Notes (Signed)
Subjective:  Patient ID: Aaron [name], male    DOB: June 12, 1935  Age: 84 y.o. MRN: [id number]  CC: No chief complaint on file.   HPI Aaron [name] presents for hypothyroidism, anxiety, dyslipidemia f/u Gardening 1 acre - growing collard greens. Recent PSA was down to 8 per pt  Outpatient Medications Prior to Visit  Medication Sig Dispense Refill  . aspirin 81 MG EC tablet Take 81 mg by mouth daily.      . CHOLECALCIFEROL PO Take 1,000 Units by mouth daily.    . clonazePAM (KLONOPIN) 1 MG tablet TAKE 1/2 TO 1 TABLET BY  MOUTH TWICE DAILY AS NEEDED FOR ANXIETY 180 tablet 1  . clotrimazole-betamethasone (LOTRISONE) cream Apply topically 2 (two) times daily. 15 g 2  . Dextromethorphan-guaiFENesin (MUCUS RELIEF DM COUGH) 20-400 MG TABS Take 1 tablet by mouth daily as needed.     . finasteride (PROSCAR) 5 MG tablet Take 1 tablet (5 mg total) by mouth daily. 90 tablet 3  . levothyroxine (SYNTHROID) 125 MCG tablet TAKE 1 TABLET BY MOUTH  DAILY 90 tablet 3  . metoprolol succinate (TOPROL-XL) 25 MG 24 hr tablet TAKE ONE-HALF OF A TABLET BY MOUTH DAILY 90 tablet 2  . omeprazole (PRILOSEC) 40 MG capsule TAKE 1 CAPSULE BY MOUTH  DAILY 90 capsule 3  . promethazine-codeine (PHENERGAN WITH CODEINE) 6.25-10 MG/5ML syrup Take 5 mLs by mouth every 4 (four) hours as needed. 240 mL 0  . simvastatin (ZOCOR) 20 MG tablet TAKE 1 TABLET BY MOUTH  DAILY 90 tablet 3   No facility-administered medications prior to visit.    ROS: Review of Systems  Constitutional: Positive for fatigue. Negative for appetite change and unexpected weight change.  HENT: Negative for congestion, nosebleeds, sneezing, sore throat and trouble swallowing.   Eyes: Negative for itching and visual disturbance.  Respiratory: Negative for cough.   Cardiovascular: Negative for chest pain, palpitations and leg swelling.  Gastrointestinal: Negative for abdominal distention, blood in stool, diarrhea and nausea.  Genitourinary: Negative  for frequency and hematuria.  Musculoskeletal: Positive for gait problem. Negative for back pain, joint swelling and neck pain.  Skin: Negative for rash.  Neurological: Negative for dizziness, tremors, speech difficulty and weakness.  Psychiatric/Behavioral: Negative for agitation, dysphoric mood and sleep disturbance. The patient is not nervous/anxious.     Objective:  BP 120/74 (BP Location: Left Arm, Patient Position: Sitting, Cuff Size: Normal)   Pulse 67   Temp 98.3 F (36.8 C) (Oral)   Ht 6\' 3"  (1.905 m)   Wt 209 lb (94.8 kg)   SpO2 98%   BMI 26.12 kg/m   BP Readings from Last 3 Encounters:  10/08/19 120/74  08/20/19 104/70  07/24/19 116/70    Wt Readings from Last 3 Encounters:  10/08/19 209 lb (94.8 kg)  08/20/19 205 lb (93 kg)  07/24/19 205 lb 12.8 oz (93.4 kg)    Physical Exam Constitutional:      General: He is not in acute distress.    Appearance: He is well-developed.     Comments: NAD  Eyes:     Conjunctiva/sclera: Conjunctivae normal.     Pupils: Pupils are equal, round, and reactive to light.  Neck:     Thyroid: No thyromegaly.     Vascular: No JVD.  Cardiovascular:     Rate and Rhythm: Normal rate and regular rhythm.     Heart sounds: Normal heart sounds. No murmur heard.  No friction rub. No gallop.   Pulmonary:  Effort: Pulmonary effort is normal. No respiratory distress.     Breath sounds: Normal breath sounds. No wheezing or rales.  Chest:     Chest wall: No tenderness.  Abdominal:     General: Bowel sounds are normal. There is no distension.     Palpations: Abdomen is soft. There is no mass.     Tenderness: There is no abdominal tenderness. There is no guarding or rebound.  Musculoskeletal:        General: No tenderness. Normal range of motion.     Cervical back: Normal range of motion.  Lymphadenopathy:     Cervical: No cervical adenopathy.  Skin:    General: Skin is warm and dry.     Findings: No rash.  Neurological:     Mental  Status: He is alert and oriented to person, place, and time.     Cranial Nerves: No cranial nerve deficit.     Motor: No abnormal muscle tone.     Coordination: Coordination normal.     Gait: Gait normal.     Deep Tendon Reflexes: Reflexes are normal and symmetric.  Psychiatric:        Behavior: Behavior normal.        Thought Content: Thought content normal.        Judgment: Judgment normal.     Lab Results  Component Value Date   WBC 4.0 10/09/2018   HGB 14.7 10/09/2018   HCT 44.1 10/09/2018   PLT 170.0 10/09/2018   GLUCOSE 108 (H) 04/09/2019   CHOL 153 10/09/2018   TRIG 130.0 10/09/2018   HDL 49.80 10/09/2018   LDLCALC 77 10/09/2018   ALT 13 11/29/2016   AST 25 11/29/2016   NA 138 04/09/2019   K 3.9 04/09/2019   CL 100 04/09/2019   CREATININE 1.58 (H) 04/09/2019   BUN 17 04/09/2019   CO2 29 04/09/2019   TSH 3.74 04/09/2019   PSA 16.38 (H) 10/09/2018   INR 1.19 04/28/2009   HGBA1C 6.8 (H) 04/09/2019    DG Chest 2 View  Result Date: 07/04/2017 CLINICAL DATA:  Cough and congestion with wheezing EXAM: CHEST - 2 VIEW COMPARISON:  September 24, 2011 FINDINGS: There is slight bibasilar atelectasis. No edema or consolidation. Heart size and pulmonary vascularity are normal. No adenopathy. No bone lesions. IMPRESSION: Slight bibasilar atelectasis.  No evident edema or consolidation. Electronically Signed   By: Lowella Grip III M.D.   On: 07/04/2017 14:05    Assessment & Plan:     Follow-up: No follow-ups on file.  Walker Kehr, MD

## 2019-10-08 NOTE — Assessment & Plan Note (Signed)
On ASA, Simvastatin

## 2019-10-09 LAB — HEPATIC FUNCTION PANEL
AG Ratio: 1.3 (calc) (ref 1.0–2.5)
ALT: 16 U/L (ref 9–46)
AST: 25 U/L (ref 10–35)
Albumin: 4.6 g/dL (ref 3.6–5.1)
Alkaline phosphatase (APISO): 74 U/L (ref 35–144)
Bilirubin, Direct: 0.1 mg/dL (ref 0.0–0.2)
Globulin: 3.6 g/dL (calc) (ref 1.9–3.7)
Indirect Bilirubin: 0.3 mg/dL (calc) (ref 0.2–1.2)
Total Bilirubin: 0.4 mg/dL (ref 0.2–1.2)
Total Protein: 8.2 g/dL — ABNORMAL HIGH (ref 6.1–8.1)

## 2019-10-09 LAB — CBC WITH DIFFERENTIAL/PLATELET
Absolute Monocytes: 494 cells/uL (ref 200–950)
Basophils Absolute: 9 cells/uL (ref 0–200)
Basophils Relative: 0.2 %
Eosinophils Absolute: 230 cells/uL (ref 15–500)
Eosinophils Relative: 4.9 %
HCT: 43.4 % (ref 38.5–50.0)
Hemoglobin: 14.2 g/dL (ref 13.2–17.1)
Lymphs Abs: 1119 cells/uL (ref 850–3900)
MCH: 29.3 pg (ref 27.0–33.0)
MCHC: 32.7 g/dL (ref 32.0–36.0)
MCV: 89.7 fL (ref 80.0–100.0)
MPV: 12.3 fL (ref 7.5–12.5)
Monocytes Relative: 10.5 %
Neutro Abs: 2848 cells/uL (ref 1500–7800)
Neutrophils Relative %: 60.6 %
Platelets: 167 10*3/uL (ref 140–400)
RBC: 4.84 10*6/uL (ref 4.20–5.80)
RDW: 13.7 % (ref 11.0–15.0)
Total Lymphocyte: 23.8 %
WBC: 4.7 10*3/uL (ref 3.8–10.8)

## 2019-10-09 LAB — HEMOGLOBIN A1C
Hgb A1c MFr Bld: 6.5 % of total Hgb — ABNORMAL HIGH (ref <5.7)
Mean Plasma Glucose: 140 (calc)
eAG (mmol/L): 7.7 (calc)

## 2019-10-09 LAB — BASIC METABOLIC PANEL
BUN/Creatinine Ratio: 11 (calc) (ref 6–22)
BUN: 17 mg/dL (ref 7–25)
CO2: 30 mmol/L (ref 20–32)
Calcium: 10.7 mg/dL — ABNORMAL HIGH (ref 8.6–10.3)
Chloride: 100 mmol/L (ref 98–110)
Creat: 1.54 mg/dL — ABNORMAL HIGH (ref 0.70–1.11)
Glucose, Bld: 100 mg/dL — ABNORMAL HIGH (ref 65–99)
Potassium: 5 mmol/L (ref 3.5–5.3)
Sodium: 139 mmol/L (ref 135–146)

## 2019-10-09 LAB — TSH: TSH: 3.04 mIU/L (ref 0.40–4.50)

## 2019-10-09 LAB — LIPID PANEL
Cholesterol: 180 mg/dL (ref <200)
HDL: 64 mg/dL (ref >=40)
LDL Cholesterol (Calc): 92 mg/dL (calc)
Non-HDL Cholesterol (Calc): 116 mg/dL (calc) (ref <130)
Total CHOL/HDL Ratio: 2.8 (calc) (ref <5.0)
Triglycerides: 140 mg/dL (ref <150)

## 2019-10-09 LAB — VITAMIN D 25 HYDROXY (VIT D DEFICIENCY, FRACTURES): Vit D, 25-Hydroxy: 32 ng/mL (ref 30–100)

## 2019-10-15 ENCOUNTER — Telehealth: Payer: Self-pay | Admitting: Internal Medicine

## 2019-10-15 NOTE — Telephone Encounter (Signed)
New message:   Pt is calling and states he still has not received his lab results through the mail. Please advise.

## 2019-10-15 NOTE — Telephone Encounter (Signed)
Called pt and informed him that lab results were just into mail end of day 10/09/2019 and it could take a little longer for him to receive

## 2019-12-10 DIAGNOSIS — Z23 Encounter for immunization: Secondary | ICD-10-CM | POA: Diagnosis not present

## 2019-12-11 ENCOUNTER — Encounter: Payer: Self-pay | Admitting: Physician Assistant

## 2019-12-11 ENCOUNTER — Encounter: Payer: Self-pay | Admitting: Cardiovascular Disease

## 2019-12-11 ENCOUNTER — Other Ambulatory Visit: Payer: Self-pay

## 2019-12-11 ENCOUNTER — Ambulatory Visit (INDEPENDENT_AMBULATORY_CARE_PROVIDER_SITE_OTHER): Payer: Medicare Other | Admitting: Physician Assistant

## 2019-12-11 VITALS — BP 118/60 | HR 78 | Ht 75.0 in | Wt 206.0 lb

## 2019-12-11 DIAGNOSIS — I10 Essential (primary) hypertension: Secondary | ICD-10-CM | POA: Diagnosis not present

## 2019-12-11 DIAGNOSIS — I25119 Atherosclerotic heart disease of native coronary artery with unspecified angina pectoris: Secondary | ICD-10-CM | POA: Diagnosis not present

## 2019-12-11 DIAGNOSIS — R079 Chest pain, unspecified: Secondary | ICD-10-CM | POA: Diagnosis not present

## 2019-12-11 DIAGNOSIS — E785 Hyperlipidemia, unspecified: Secondary | ICD-10-CM

## 2019-12-11 DIAGNOSIS — J449 Chronic obstructive pulmonary disease, unspecified: Secondary | ICD-10-CM | POA: Diagnosis not present

## 2019-12-11 DIAGNOSIS — E039 Hypothyroidism, unspecified: Secondary | ICD-10-CM

## 2019-12-11 NOTE — Progress Notes (Signed)
Cardiology Office Note:    Date:  12/13/2019   ID:  Aaron [name], DOB May 28, 1935, MRN [id number]  PCP:  Cassandria Anger, MD  [facility] Cardiologist:  Shelva Majestic, MD  [place] Electrophysiologist:  None   Referring MD: Cassandria Anger, MD   Chief Complaint  Patient presents with  . Follow-up    seen for Dr. [name]    History of Present Illness:    Aaron [name] is a 84 y.o. male with a hx of CAD, COPD, history of esophageal cancer s/p chemo/radiation therapy, hypertension, hyperlipidemia and hypothyroidism.  Previous cardiac catheterization in June 2010 showed only 30% ostial LAD lesion, 20% OM1 lesion.  Previous echocardiogram obtained in October 2016 showed EF 55 to 60%, mild to moderate AI, mild TR and trivial PR.  Patient presents today for cardiology office visit.  Blood pressure is fairly controlled.  He does not have any lower extremity edema, orthopnea or PND.  He has been noticing some intermittent chest pain recently.  We discussed various options, although coronary CT will show more detail, however his poor renal function make me hesitant to proceed with CTA with contrast.  We eventually decided to proceed with Myoview.  If negative, he can follow-up with Dr. [name] in 6 months.   Past Medical History:  Diagnosis Date  . Allergic rhinitis   . Allergy   . Anxiety   . Arrhythmia   . CAD (coronary artery disease)    mild  . COPD (chronic obstructive pulmonary disease) ([place])   . Diverticulosis of colon   . Elevated glucose   . 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 Esophageal stricture   . Gastritis   . GERD (gastroesophageal reflux disease)   . Hemorrhoids   . Hiatal hernia   . History of chemotherapy 05/03/09-06/16/09   Taxol/carboplatin  . History of nuclear stress test 07/2008   exercise; mild-mod perfusion dfect in basal inf/mid inf/apical inf regions; EKG negative for ischemia  .  History of radiation therapy 05/03/09-06/16/09   squamous cell ca of esophagus  . Hyperlipidemia    [name]  . Hypothyroid    s/p radiotherapy    Past Surgical History:  Procedure Laterality Date  . CARDIAC CATHETERIZATION  08/2008   mild nonobstructive CAD with 30% LAD stenosis prox & 20% OM1 narrowing   . COLONOSCOPY    . CREATION OF CUTANEOUS STOMA  2011  . ESOPHAGOGASTRODUODENOSCOPY  10/03/2011   Procedure: ESOPHAGOGASTRODUODENOSCOPY (EGD);  Surgeon: [name], MD,FACG;  Location: [name];  Service: Endoscopy;  Laterality: N/A;  no fluro needed  . Lower Back Cyst Removed     [facility]  . POLYPECTOMY    . [name] DILATION  10/03/2011   Procedure: [name] DILATION;  Surgeon: [name], MD,FACG;  Location: [facility];  Service: Endoscopy;  Laterality: N/A;  . TONSILLECTOMY     age 26  . TRANSTHORACIC ECHOCARDIOGRAM  06/2009   EF=>55%; trace MR/TR  . UPPER GASTROINTESTINAL ENDOSCOPY      Current Medications: Current Meds  Medication Sig  . aspirin 81 MG EC tablet Take 81 mg by mouth daily.    . CHOLECALCIFEROL PO Take 1,000 Units by mouth daily.  . clonazePAM (KLONOPIN) 1 MG tablet TAKE 1/2 TO 1 TABLET BY  MOUTH TWICE DAILY AS NEEDED FOR ANXIETY  . clotrimazole-betamethasone (LOTRISONE) cream Apply topically 2 (two) times daily.  Marland Kitchen Dextromethorphan-guaiFENesin (MUCUS RELIEF  DM COUGH) 20-400 MG TABS Take 1 tablet by mouth daily as needed.   . finasteride (PROSCAR) 5 MG tablet Take 1 tablet (5 mg total) by mouth daily.  Marland Kitchen levothyroxine (SYNTHROID) 125 MCG tablet TAKE 1 TABLET BY MOUTH  DAILY  . metoprolol succinate (TOPROL-XL) 25 MG 24 hr tablet TAKE ONE-HALF OF A TABLET BY MOUTH DAILY  . omeprazole (PRILOSEC) 40 MG capsule TAKE 1 CAPSULE BY MOUTH  DAILY  . simvastatin (ZOCOR) 20 MG tablet TAKE 1 TABLET BY MOUTH  DAILY     Allergies:   Amoxicillin, Cefuroxime axetil, Fluconazole, and Escitalopram   Social History   Socioeconomic History  . Marital status:  Married    Spouse name: Not on file  . Number of children: 3  . Years of education: 12th  . Highest education level: Not on file  Occupational History  . Occupation: Retired, [place] [employer]    Employer: RETIRED    Comment: Supervisor  Tobacco Use  . Smoking status: Former Smoker    Packs/day: 1.00    Years: 40.00    Pack years: 40.00    Quit date: 01/16/2001    Years since quitting: 18.9  . Smokeless tobacco: Never Used  . Tobacco comment: Quit 2002  Vaping Use  . Vaping Use: Never used  Substance and Sexual Activity  . Alcohol use: Yes    Alcohol/week: 0.0 standard drinks    Comment: occasional drink 2-3 a month  . Drug use: No    Comment: quit smoking 2002  . Sexual activity: Yes  Other Topics Concern  . Not on file  Social History Narrative   Daily Caffeine - 1      Social Determinants of Health   Financial Resource Strain:   . Difficulty of Paying Living Expenses: Not on file  Food Insecurity:   . Worried About Charity fundraiser in the Last Year: Not on file  . Ran Out of Food in the Last Year: Not on file  Transportation Needs:   . Lack of Transportation (Medical): Not on file  . Lack of Transportation (Non-Medical): Not on file  Physical Activity:   . Days of Exercise per Week: Not on file  . Minutes of Exercise per Session: Not on file  Stress:   . Feeling of Stress : Not on file  Social Connections:   . Frequency of Communication with Friends and Family: Not on file  . Frequency of Social Gatherings with Friends and Family: Not on file  . Attends Religious Services: Not on file  . Active Member of Clubs or Organizations: Not on file  . Attends Archivist Meetings: Not on file  . Marital Status: Not on file     Family History: The patient's family history includes Colon cancer in his sister; Colon polyps in his brother; Diabetes in his father; Prostate cancer in his father; Prostate cancer (age of onset: 47) in his brother;  Prostate cancer (age of onset: 63) in his brother; Stroke in his mother; Stroke (age of onset: 26) in his maternal grandfather. There is no history of Coronary artery disease, Hypertension, Stomach cancer, or Rectal cancer.  ROS:   Please see the history of present illness.     All other systems reviewed and are negative.  EKGs/Labs/Other Studies Reviewed:    The following studies were reviewed today:  Echo 12/30/2014 LV EF: 55% -  60%   -------------------------------------------------------------------  Indications:   785.2 Cardiac murmur (R01.1).   -------------------------------------------------------------------  History:  PMH: mild COPD, Hypothyroidism. Coronary artery  disease.   -------------------------------------------------------------------  Study Conclusions   - Left ventricle: Systolic function was normal. The estimated  ejection fraction was in the range of 55% to 60%. The study is  not technically sufficient to allow evaluation of LV diastolic  function.  - Aortic valve: There was mild to moderate regurgitation.  - Mitral valve: There was trivial regurgitation.  - Right ventricle: The cavity size was normal. Wall thickness was  normal. Systolic function was normal.  - Tricuspid valve: There was mild regurgitation.  - Pulmonic valve: There was trivial regurgitation.  - Inferior vena cava: The vessel was normal in size. The  respirophasic diameter changes were in the normal range (= 50%),  consistent with normal central venous pressure.    EKG:  EKG is ordered today.  The ekg ordered today demonstrates normal sinus rhythm without significant ST-T wave changes.  Recent Labs: 10/08/2019: ALT 16; BUN 17; Creat 1.54; Hemoglobin 14.2; Platelets 167; Potassium 5.0; Sodium 139; TSH 3.04  Recent Lipid Panel    Component Value Date/Time   CHOL 180 10/08/2019 1119   CHOL 150 12/05/2017 0942   TRIG 140 10/08/2019 1119   TRIG 120 02/26/2006 1420    HDL 64 10/08/2019 1119   HDL 57 12/05/2017 0942   CHOLHDL 2.8 10/08/2019 1119   VLDL 26.0 10/09/2018 1032   LDLCALC 92 10/08/2019 1119    Physical Exam:    VS:  BP 118/60   Pulse 78   Ht 6\' 3"  (1.905 m)   Wt 206 lb (93.4 kg)   SpO2 99%   BMI 25.75 kg/m     Wt Readings from Last 3 Encounters:  12/11/19 206 lb (93.4 kg)  10/08/19 209 lb (94.8 kg)  08/20/19 205 lb (93 kg)     GEN:  Well nourished, well developed in no acute distress HEENT: Normal NECK: No JVD; No carotid bruits LYMPHATICS: No lymphadenopathy CARDIAC: RRR, no murmurs, rubs, gallops RESPIRATORY:  Clear to auscultation without rales, wheezing or rhonchi  ABDOMEN: Soft, non-tender, non-distended MUSCULOSKELETAL:  No edema; No deformity  SKIN: Warm and dry NEUROLOGIC:  Alert and oriented x 3 PSYCHIATRIC:  Normal affect   ASSESSMENT:    1. Chest pain, unspecified type   2. Coronary artery disease involving native coronary artery of native heart with angina pectoris ([place])   3. Chronic obstructive pulmonary disease, unspecified COPD type ([place])   4. Essential hypertension   5. Hyperlipidemia LDL goal <70   6. Hypothyroidism, unspecified type    PLAN:    In order of problems listed above:  1. Chest pain: Symptom does not have clear correlation with the degree of exertion and can occur at any time.  I recommend a Myoview as initial evaluation.  Although coronary CT will give [place] more details, I am hesitant to proceed with CTA with contrast due to his renal function and the risk of contrast nephropathy  2. CAD: Previous cardiac catheterization in 2010 showed nonobstructive disease  3. COPD: No recent exacerbation  4. Hypertension: Blood pressure well controlled  5. Hyperlipidemia: On Zocor  6. Hypothyroidism: On levothyroxine.   Medication Adjustments/Labs and Tests Ordered: Current medicines are reviewed at length with the patient today.  Concerns regarding medicines are outlined above.  Orders Placed  This Encounter  Procedures  . MYOCARDIAL PERFUSION IMAGING  . EKG 12-Lead   No orders of the defined types were placed in this encounter.   Patient Instructions  Medication  Instructions:  Your physician recommends that you continue on your current medications as directed. Please refer to the Current Medication list given to you today.  *If you need a refill on your cardiac medications before your next appointment, please call your pharmacy*  Lab Work: NONE ordered at this time of appointment   If you have labs (blood work) drawn today and your tests are completely normal, you will receive your results only by: Marland Kitchen MyChart Message (if you have MyChart) OR . A paper copy in the mail If you have any lab test that is abnormal or we need to change your treatment, we will call you to review the results.  Testing/Procedures: Your physician has requested that you have a lexiscan myoview. For further information please visit [email or web address]. Please follow instruction sheet, as given.  Follow-Up: At [facility], you and your health needs are our priority.  As part of our continuing mission to provide you with exceptional heart care, we have created designated Provider Care Teams.  These Care Teams include your primary Cardiologist (physician) and Advanced Practice Providers (APPs -  Physician Assistants and Nurse Practitioners) who all work together to provide you with the care you need, when you need it.  We recommend signing up for the patient portal called "MyChart".  Sign up information is provided on this After Visit Summary.  MyChart is used to connect with patients for Virtual Visits (Telemedicine).  Patients are able to view lab/test results, encounter notes, upcoming appointments, etc.  Non-urgent messages can be sent to your provider as well.   To learn more about what you can do with MyChart, go to [email or web address].    Your next appointment:   6 month(s)  The format for  your next appointment:   In Person  Provider:   Shelva Majestic, MD  Other Instructions      Signed, Almyra Deforest, [place]  12/13/2019 10:41 PM    [place]

## 2019-12-11 NOTE — Patient Instructions (Signed)
Medication Instructions:  Your physician recommends that you continue on your current medications as directed. Please refer to the Current Medication list given to you today.  *If you need a refill on your cardiac medications before your next appointment, please call your pharmacy*  Lab Work: NONE ordered at this time of appointment   If you have labs (blood work) drawn today and your tests are completely normal, you will receive your results only by:  [place] (if you have MyChart) OR  A paper copy in the mail If you have any lab test that is abnormal or we need to change your treatment, we will call you to review the results.  Testing/Procedures: Your physician has requested that you have a lexiscan myoview. For further information please visit [email or web address]. Please follow instruction sheet, as given.  Follow-Up: At [facility], you and your health needs are our priority.  As part of our continuing mission to provide you with exceptional heart care, we have created designated Provider Care Teams.  These Care Teams include your primary Cardiologist (physician) and Advanced Practice Providers (APPs -  Physician Assistants and Nurse Practitioners) who all work together to provide you with the care you need, when you need it.  We recommend signing up for the patient portal called "MyChart".  Sign up information is provided on this After Visit Summary.  MyChart is used to connect with patients for Virtual Visits (Telemedicine).  Patients are able to view lab/test results, encounter notes, upcoming appointments, etc.  Non-urgent messages can be sent to your provider as well.   To learn more about what you can do with MyChart, go to [email or web address].    Your next appointment:   6 month(s)  The format for your next appointment:   In Person  Provider:   Shelva Majestic, MD  Other Instructions

## 2019-12-13 ENCOUNTER — Encounter: Payer: Self-pay | Admitting: Physician Assistant

## 2019-12-25 ENCOUNTER — Telehealth (HOSPITAL_COMMUNITY): Payer: Self-pay | Admitting: *Deleted

## 2019-12-25 NOTE — Telephone Encounter (Signed)
Close encounter 

## 2019-12-29 ENCOUNTER — Other Ambulatory Visit: Payer: Self-pay

## 2019-12-29 ENCOUNTER — Ambulatory Visit (HOSPITAL_COMMUNITY)
Admission: RE | Admit: 2019-12-29 | Discharge: 2019-12-29 | Disposition: A | Discharge status: Home / Self Care | Payer: Medicare Other | Source: Ambulatory Visit | Attending: Cardiovascular Disease | Admitting: Cardiovascular Disease

## 2019-12-29 DIAGNOSIS — R079 Chest pain, unspecified: Secondary | ICD-10-CM | POA: Diagnosis not present

## 2019-12-29 LAB — MYOCARDIAL PERFUSION IMAGING
LV dias vol: 91 mL (ref 62–150)
LV sys vol: 44 mL
Peak HR: 96 {beats}/min
Rest HR: 81 {beats}/min
SDS: 0
SRS: 2
SSS: 2
TID: 1.03

## 2019-12-29 MED ORDER — TECHNETIUM TC 99M TETROFOSMIN IV KIT
27.5000 | PACK | Freq: Once | INTRAVENOUS | Status: AC | PRN
  Administered 2019-12-29: 27.5 via INTRAVENOUS
  Filled 2019-12-29: qty 28

## 2019-12-29 MED ORDER — REGADENOSON 0.4 MG/5ML IV SOLN
0.4000 mg | Freq: Once | INTRAVENOUS | Status: AC
  Administered 2019-12-29: 0.4 mg via INTRAVENOUS

## 2019-12-29 MED ORDER — AMINOPHYLLINE 25 MG/ML IV SOLN
75.0000 mg | Freq: Once | INTRAVENOUS | Status: AC
  Administered 2019-12-29: 75 mg via INTRAVENOUS

## 2019-12-29 MED ORDER — TECHNETIUM TC 99M TETROFOSMIN IV KIT
10.1000 | PACK | Freq: Once | INTRAVENOUS | Status: AC | PRN
  Administered 2019-12-29: 10.1 via INTRAVENOUS
  Filled 2019-12-29: qty 11

## 2020-01-06 ENCOUNTER — Telehealth: Payer: Self-pay | Admitting: Cardiovascular Disease

## 2020-01-06 ENCOUNTER — Telehealth: Payer: Self-pay | Admitting: Internal Medicine

## 2020-01-06 DIAGNOSIS — Z23 Encounter for immunization: Secondary | ICD-10-CM | POA: Diagnosis not present

## 2020-01-06 NOTE — Telephone Encounter (Signed)
    1.Medication Requested:clotrimazole-betamethasone (LOTRISONE) cream  2. Pharmacy (Name, Street, [place]):[facility], [place], [address]  3. On Med List: yes  4. Last Visit with PCP: 10/08/19  5. Next visit date with PCP: 04/12/20   Agent: Please be advised that RX refills may take up to 3 business days. We ask that you follow-up with your pharmacy.

## 2020-01-06 NOTE — Telephone Encounter (Signed)
Pt advised his stress test results.

## 2020-01-06 NOTE — Telephone Encounter (Signed)
Follow Up:     Pt wants to know if his Stress Test results are ready from 12-29-19 please?

## 2020-01-08 NOTE — Telephone Encounter (Signed)
Medication has not been refill since 09/24/2016. Will hold for approval from MD when he returns.Marland KitchenJohny [name]

## 2020-01-12 MED ORDER — CLOTRIMAZOLE-BETAMETHASONE 1-0.05 % EX CREA
TOPICAL_CREAM | Freq: Two times a day (BID) | CUTANEOUS | 1 refills | Status: DC
Start: 2020-01-12 — End: 2020-06-17

## 2020-01-12 NOTE — Telephone Encounter (Signed)
Called pt there was no answer LMOM rx sent to mail order pharmacy.Marland KitchenJohny [name]

## 2020-01-12 NOTE — Telephone Encounter (Signed)
Ok Done Thx 

## 2020-01-18 ENCOUNTER — Other Ambulatory Visit: Payer: Self-pay | Admitting: Internal Medicine

## 2020-01-21 ENCOUNTER — Telehealth: Payer: Self-pay | Admitting: Internal Medicine

## 2020-01-21 NOTE — Telephone Encounter (Signed)
   Patient requesting order for promethazine-codeine (PHENERGAN WITH CODEINE) 6.25-10 MG/5ML syrup for cough related to past cancer diagnosis. Requesting order be sent to [facility]

## 2020-01-22 MED ORDER — PROMETHAZINE-CODEINE 6.25-10 MG/5ML PO SYRP
5.0000 mL | ORAL_SOLUTION | ORAL | 1 refills | Status: DC | PRN
Start: 2020-01-22 — End: 2020-04-20

## 2020-01-22 NOTE — Telephone Encounter (Signed)
Okay.  Thanks.

## 2020-01-22 NOTE — Telephone Encounter (Signed)
Notified pt rx sent to [facility]  

## 2020-02-02 DIAGNOSIS — H401211 Low-tension glaucoma, right eye, mild stage: Secondary | ICD-10-CM | POA: Diagnosis not present

## 2020-02-02 DIAGNOSIS — H40022 Open angle with borderline findings, high risk, left eye: Secondary | ICD-10-CM | POA: Diagnosis not present

## 2020-02-12 ENCOUNTER — Other Ambulatory Visit: Payer: Self-pay | Admitting: Internal Medicine

## 2020-02-25 ENCOUNTER — Telehealth: Payer: Self-pay | Admitting: *Deleted

## 2020-02-25 NOTE — Telephone Encounter (Signed)
Called to request follow up appointment with Dr. Benay Spice. Last seen in 2019 and was supposed to have 1 year follow up and did not come. Scheduling message sent to schedule him w/MD in mid to late January.

## 2020-02-26 ENCOUNTER — Telehealth: Payer: Self-pay | Admitting: Oncology

## 2020-02-26 NOTE — Telephone Encounter (Signed)
Scheduled appt per 12/2 sch msg - pt is aware of appt date and time   

## 2020-03-09 DIAGNOSIS — R972 Elevated prostate specific antigen [PSA]: Secondary | ICD-10-CM | POA: Diagnosis not present

## 2020-03-14 DIAGNOSIS — N401 Enlarged prostate with lower urinary tract symptoms: Secondary | ICD-10-CM | POA: Diagnosis not present

## 2020-03-14 DIAGNOSIS — R972 Elevated prostate specific antigen [PSA]: Secondary | ICD-10-CM | POA: Diagnosis not present

## 2020-03-14 DIAGNOSIS — R3912 Poor urinary stream: Secondary | ICD-10-CM | POA: Diagnosis not present

## 2020-03-28 ENCOUNTER — Other Ambulatory Visit: Payer: Medicare Other

## 2020-03-28 DIAGNOSIS — Z20822 Contact with and (suspected) exposure to covid-19: Secondary | ICD-10-CM

## 2020-03-29 LAB — NOVEL CORONAVIRUS, NAA: SARS-CoV-2, NAA: NOT DETECTED

## 2020-03-29 LAB — SARS-COV-2, NAA 2 DAY TAT

## 2020-04-01 ENCOUNTER — Other Ambulatory Visit: Payer: Self-pay | Admitting: Internal Medicine

## 2020-04-06 ENCOUNTER — Telehealth: Payer: Self-pay | Admitting: *Deleted

## 2020-04-06 NOTE — Telephone Encounter (Signed)
Pt's wife  notified of negative COVID-19 results. Understanding verbalized.

## 2020-04-12 ENCOUNTER — Ambulatory Visit: Payer: Medicare Other | Admitting: Internal Medicine

## 2020-04-15 ENCOUNTER — Inpatient Hospital Stay: Payer: Medicare Other | Admitting: Oncology

## 2020-04-19 ENCOUNTER — Other Ambulatory Visit: Payer: Self-pay

## 2020-04-20 ENCOUNTER — Ambulatory Visit (INDEPENDENT_AMBULATORY_CARE_PROVIDER_SITE_OTHER): Payer: Medicare Other

## 2020-04-20 ENCOUNTER — Ambulatory Visit (INDEPENDENT_AMBULATORY_CARE_PROVIDER_SITE_OTHER): Payer: Medicare Other | Admitting: Internal Medicine

## 2020-04-20 ENCOUNTER — Encounter: Payer: Self-pay | Admitting: Internal Medicine

## 2020-04-20 ENCOUNTER — Other Ambulatory Visit: Payer: Self-pay

## 2020-04-20 DIAGNOSIS — K5900 Constipation, unspecified: Secondary | ICD-10-CM | POA: Diagnosis not present

## 2020-04-20 DIAGNOSIS — K2100 Gastro-esophageal reflux disease with esophagitis, without bleeding: Secondary | ICD-10-CM | POA: Diagnosis not present

## 2020-04-20 DIAGNOSIS — E039 Hypothyroidism, unspecified: Secondary | ICD-10-CM

## 2020-04-20 DIAGNOSIS — R059 Cough, unspecified: Secondary | ICD-10-CM

## 2020-04-20 DIAGNOSIS — R413 Other amnesia: Secondary | ICD-10-CM | POA: Diagnosis not present

## 2020-04-20 DIAGNOSIS — R7309 Other abnormal glucose: Secondary | ICD-10-CM

## 2020-04-20 DIAGNOSIS — N2889 Other specified disorders of kidney and ureter: Secondary | ICD-10-CM

## 2020-04-20 DIAGNOSIS — R972 Elevated prostate specific antigen [PSA]: Secondary | ICD-10-CM

## 2020-04-20 DIAGNOSIS — N183 Chronic kidney disease, stage 3 unspecified: Secondary | ICD-10-CM

## 2020-04-20 LAB — COMPREHENSIVE METABOLIC PANEL
ALT: 13 U/L (ref 0–53)
AST: 25 U/L (ref 0–37)
Albumin: 4.5 g/dL (ref 3.5–5.2)
Alkaline Phosphatase: 68 U/L (ref 39–117)
BUN: 14 mg/dL (ref 6–23)
CO2: 30 mEq/L (ref 19–32)
Calcium: 10.2 mg/dL (ref 8.4–10.5)
Chloride: 99 mEq/L (ref 96–112)
Creatinine, Ser: 1.54 mg/dL — ABNORMAL HIGH (ref 0.40–1.50)
GFR: 41.8 mL/min — ABNORMAL LOW (ref >60.00)
Glucose, Bld: 107 mg/dL — ABNORMAL HIGH (ref 70–99)
Potassium: 4 mEq/L (ref 3.5–5.1)
Sodium: 135 mEq/L (ref 135–145)
Total Bilirubin: 0.5 mg/dL (ref 0.2–1.2)
Total Protein: 8.1 g/dL (ref 6.0–8.3)

## 2020-04-20 LAB — HEMOGLOBIN A1C: Hgb A1c MFr Bld: 6.6 % — ABNORMAL HIGH (ref 4.6–6.5)

## 2020-04-20 LAB — PSA: PSA: 10.87 ng/mL — ABNORMAL HIGH (ref 0.10–4.00)

## 2020-04-20 LAB — TSH: TSH: 1.09 u[IU]/mL (ref 0.35–4.50)

## 2020-04-20 MED ORDER — SENNOSIDES-DOCUSATE SODIUM 8.6-50 MG PO TABS: 2.0000 | ORAL_TABLET | Freq: Every day | ORAL | 6 refills | Status: DC | PRN

## 2020-04-20 NOTE — Patient Instructions (Signed)
You can try Lion's Mane Mushroom capsules for memory, focus, neuropathy ([employer] or [employer])   

## 2020-04-20 NOTE — Assessment & Plan Note (Signed)
F/u w/Dr Wrenn  

## 2020-04-20 NOTE — Assessment & Plan Note (Signed)
Recurrent Rare Prom-cod syr prn Get a CXR

## 2020-04-20 NOTE — Assessment & Plan Note (Signed)
On Levothroid 

## 2020-04-20 NOTE — Assessment & Plan Note (Signed)
Try Colace 

## 2020-04-20 NOTE — Assessment & Plan Note (Signed)
Monitor BMET. 

## 2020-04-20 NOTE — Addendum Note (Signed)
Addended by: Raliegh Ip on: 04/20/2020 02:05 PM   Modules accepted: Orders

## 2020-04-20 NOTE — Progress Notes (Signed)
Subjective:  Patient ID: Aaron [name], male    DOB: [birth year]  Age: 85 y.o. MRN: [id number]  CC: Follow-up (6 month f/u)   HPI Aaron [name] presents for hypothyroidism, anxiety, dyslipidemia f/u C/o chronic cough  Outpatient Medications Prior to Visit  Medication Sig Dispense Refill  . aspirin 81 MG EC tablet Take 81 mg by mouth daily.    . CHOLECALCIFEROL PO Take 1,000 Units by mouth daily.    . clonazePAM (KLONOPIN) 1 MG tablet TAKE 1/2 TO 1 TABLET BY  MOUTH TWICE DAILY AS NEEDED FOR ANXIETY 180 tablet 1  . clotrimazole-betamethasone (LOTRISONE) cream Apply topically 2 (two) times daily. 90 g 1  . Dextromethorphan-guaiFENesin 20-400 MG TABS Take 1 tablet by mouth daily as needed.     . finasteride (PROSCAR) 5 MG tablet TAKE 1 TABLET BY MOUTH  DAILY 90 tablet 1  . levothyroxine (SYNTHROID) 125 MCG tablet TAKE 1 TABLET BY MOUTH  DAILY 90 tablet 2  . metoprolol succinate (TOPROL-XL) 25 MG 24 hr tablet TAKE ONE-HALF OF A TABLET BY MOUTH DAILY 90 tablet 2  . omeprazole (PRILOSEC) 40 MG capsule TAKE 1 CAPSULE BY MOUTH  DAILY 90 capsule 3  . simvastatin (ZOCOR) 20 MG tablet TAKE 1 TABLET BY MOUTH  DAILY 90 tablet 2  . promethazine-codeine (PHENERGAN WITH CODEINE) 6.25-10 MG/5ML syrup Take 5 mLs by mouth every 4 (four) hours as needed. (Patient not taking: Reported on 04/20/2020) 300 mL 1   No facility-administered medications prior to visit.    ROS: Review of Systems  Constitutional: Negative for appetite change, fatigue and unexpected weight change.  HENT: Negative for congestion, nosebleeds, sneezing, sore throat and trouble swallowing.   Eyes: Negative for itching and visual disturbance.  Respiratory: Positive for cough.   Cardiovascular: Negative for chest pain, palpitations and leg swelling.  Gastrointestinal: Positive for constipation. Negative for abdominal distention, abdominal pain, blood in stool, diarrhea and nausea.  Genitourinary: Negative for frequency and  hematuria.  Musculoskeletal: Positive for arthralgias. Negative for back pain, gait problem, joint swelling and neck pain.  Skin: Negative for rash.  Neurological: Negative for dizziness, tremors, speech difficulty and weakness.  Psychiatric/Behavioral: Negative for agitation, dysphoric mood, sleep disturbance and suicidal ideas. The patient is nervous/anxious.     Objective:  BP 120/68 (BP Location: Left Arm)   Pulse 82   Temp 98.5 F (36.9 C) (Oral)   Ht 6\' 3"  (1.905 m)   Wt 206 lb (93.4 kg)   SpO2 96%   BMI 25.75 kg/m   BP Readings from Last 3 Encounters:  04/20/20 120/68  12/11/19 118/60  10/08/19 120/74    Wt Readings from Last 3 Encounters:  04/20/20 206 lb (93.4 kg)  12/29/19 206 lb (93.4 kg)  12/11/19 206 lb (93.4 kg)    Physical Exam Constitutional:      General: He is not in acute distress.    Appearance: He is well-developed.     Comments: NAD  HENT:     Mouth/Throat:     Mouth: Oropharynx is clear and moist.  Eyes:     Conjunctiva/sclera: Conjunctivae normal.     Pupils: Pupils are equal, round, and reactive to light.  Neck:     Thyroid: No thyromegaly.     Vascular: No JVD.  Cardiovascular:     Rate and Rhythm: Normal rate and regular rhythm.     Pulses: Intact distal pulses.     Heart sounds: Normal heart sounds. No murmur heard. No friction rub. No  gallop.   Pulmonary:     Effort: Pulmonary effort is normal. No respiratory distress.     Breath sounds: Normal breath sounds. No wheezing or rales.  Chest:     Chest wall: No tenderness.  Abdominal:     General: Bowel sounds are normal. There is no distension.     Palpations: Abdomen is soft. There is no mass.     Tenderness: There is no abdominal tenderness. There is no guarding or rebound.  Musculoskeletal:        General: No tenderness or edema. Normal range of motion.     Cervical back: Normal range of motion.  Lymphadenopathy:     Cervical: No cervical adenopathy.  Skin:    General: Skin  is warm and dry.     Findings: No rash.  Neurological:     Mental Status: He is alert and oriented to person, place, and time.     Cranial Nerves: No cranial nerve deficit.     Motor: No abnormal muscle tone.     Coordination: He displays a negative Romberg sign. Coordination normal.     Gait: Gait normal.     Deep Tendon Reflexes: Reflexes are normal and symmetric.  Psychiatric:        Mood and Affect: Mood and affect normal.        Behavior: Behavior normal.        Thought Content: Thought content normal.        Judgment: Judgment normal.     Lab Results  Component Value Date   WBC 4.7 10/08/2019   HGB 14.2 10/08/2019   HCT 43.4 10/08/2019   PLT 167 10/08/2019   GLUCOSE 100 (H) 10/08/2019   CHOL 180 10/08/2019   TRIG 140 10/08/2019   HDL 64 10/08/2019   LDLCALC 92 10/08/2019   ALT 16 10/08/2019   AST 25 10/08/2019   NA 139 10/08/2019   K 5.0 10/08/2019   CL 100 10/08/2019   CREATININE 1.54 (H) 10/08/2019   BUN 17 10/08/2019   CO2 30 10/08/2019   TSH 3.04 10/08/2019   PSA 16.38 (H) 10/09/2018   INR 1.19 04/28/2009   HGBA1C 6.5 (H) 10/08/2019    MYOCARDIAL PERFUSION IMAGING  Result Date: 12/29/2019  The left ventricular ejection fraction is mildly decreased (45-54%).  Nuclear stress EF: 52%.  There was no ST segment deviation noted during stress.  Defect 1: There is a medium defect of moderate severity present in the basal inferior and mid inferior location.  This is a low risk study.  Mildly abnormal, low risk stress nuclear study with small prior inferior infarct versus prominent diaphragmatic attenuation.  No ischemia.  Gated ejection fraction 52% with normal wall motion.    Assessment & Plan:    Walker Kehr, MD

## 2020-04-20 NOTE — Assessment & Plan Note (Addendum)
Mild Try Lions mane 

## 2020-04-20 NOTE — Assessment & Plan Note (Signed)
Prilosec 

## 2020-04-27 ENCOUNTER — Other Ambulatory Visit: Payer: Self-pay

## 2020-04-27 ENCOUNTER — Ambulatory Visit (INDEPENDENT_AMBULATORY_CARE_PROVIDER_SITE_OTHER): Payer: Medicare Other | Admitting: Gastroenterology

## 2020-04-27 ENCOUNTER — Encounter: Payer: Self-pay | Admitting: Gastroenterology

## 2020-04-27 VITALS — BP 100/70 | HR 70 | Ht 75.0 in | Wt 207.0 lb

## 2020-04-27 DIAGNOSIS — Z8719 Personal history of other diseases of the digestive system: Secondary | ICD-10-CM | POA: Diagnosis not present

## 2020-04-27 DIAGNOSIS — Z8501 Personal history of malignant neoplasm of esophagus: Secondary | ICD-10-CM | POA: Diagnosis not present

## 2020-04-27 DIAGNOSIS — K219 Gastro-esophageal reflux disease without esophagitis: Secondary | ICD-10-CM

## 2020-04-27 MED ORDER — OMEPRAZOLE 40 MG PO CPDR
40.0000 mg | DELAYED_RELEASE_CAPSULE | Freq: Every day | ORAL | 3 refills | Status: DC
Start: 2020-04-27 — End: 2021-06-28

## 2020-04-27 NOTE — Progress Notes (Signed)
    History of Present Illness: This is an 85 year old male with a history of esophageal cancer status post chemoradiation, GERD and esophageal strictures. His proximal esophageal stricture is felt secondary to his cancer and chemoradiation and his distal stricture is felt to be reflux induced. He denies dysphagia, odynophagia, reflux symptoms. He has no GI complaints.   Current Medications, Allergies, Past Medical History, Past Surgical History, Family History and Social History were reviewed in [employer] record.   Physical Exam: General: Well developed, well nourished, no acute distress Head: Normocephalic and atraumatic Eyes: Sclerae anicteric, EOMI Ears: Normal auditory acuity Mouth: Not examined, mask on during Covid-19 pandemic Lungs: Clear throughout to auscultation Heart: Regular rate and rhythm; no murmurs, rubs or bruits Abdomen: Soft, non tender and non distended. No masses, hepatosplenomegaly or hernias noted. Normal Bowel sounds Rectal: Not done Musculoskeletal: Symmetrical with no gross deformities  Pulses:  Normal pulses noted Extremities: No clubbing, cyanosis, edema or deformities noted Neurological: Alert oriented x 4, grossly nonfocal Psychological:  Alert and cooperative. Normal mood and affect   Assessment and Recommendations:  1. GERD history of distal esophageal stricture.  Follow antireflux measures and continue omeprazole 40 mg p.o. every morning. REV in 1 year.   2. History of esophageal cancer, history of proximal esophageal stricture at the cancer site. Status post chemoradiation. Followed by Dr. Benay Spice.  3. CRC screening, average risk.  He is no longer in a CRC screening program due to his age.

## 2020-04-27 NOTE — Patient Instructions (Signed)
We have sent the following prescriptions to your mail in pharmacy: omeprazole.   If you have not heard from your mail in pharmacy within 1 week or if you have not received your medication in the mail, please contact us at [phone] so we may find out why.  Thank you for choosing me and [facility].  Malcolm T. Stark, Jr., MD., FACG  

## 2020-04-29 ENCOUNTER — Inpatient Hospital Stay: Payer: Medicare Other | Attending: Oncology | Admitting: Nurse Practitioner

## 2020-04-29 ENCOUNTER — Encounter: Payer: Self-pay | Admitting: Nurse Practitioner

## 2020-04-29 ENCOUNTER — Other Ambulatory Visit: Payer: Self-pay

## 2020-04-29 VITALS — BP 101/68 | HR 86 | Temp 97.8°F | Resp 16 | Ht 75.0 in | Wt 208.5 lb

## 2020-04-29 DIAGNOSIS — Z8501 Personal history of malignant neoplasm of esophagus: Secondary | ICD-10-CM | POA: Insufficient documentation

## 2020-04-29 DIAGNOSIS — C154 Malignant neoplasm of middle third of esophagus: Secondary | ICD-10-CM

## 2020-04-29 DIAGNOSIS — R972 Elevated prostate specific antigen [PSA]: Secondary | ICD-10-CM | POA: Insufficient documentation

## 2020-04-29 DIAGNOSIS — Z923 Personal history of irradiation: Secondary | ICD-10-CM | POA: Diagnosis not present

## 2020-04-29 NOTE — Progress Notes (Addendum)
  Aaron [name] OFFICE PROGRESS NOTE   Diagnosis: Esophagus cancer  INTERVAL HISTORY:   Aaron [name] was last seen at the [facility] 05/02/2017.  He did not return for the next visit at a 1 year interval.  He recently contacted the office to request an appointment.  He feels well.  He denies dysphagia.  He is tolerating regular diet.  He has a good appetite.  He reports his weight is stable.  He denies pain.  No nausea or vomiting.  Mild constipation.  Objective:  Vital signs in last 24 hours:  Blood pressure 101/68, pulse 86, temperature 97.8 F (36.6 C), temperature source Tympanic, resp. rate 16, height 6\' 3"  (1.905 m), weight 208 lb 8 oz (94.6 kg), SpO2 98 %.    HEENT: Neck without mass. Lymphatics: No palpable cervical, supraclavicular, axillary or inguinal lymph nodes. Resp: Lungs clear bilaterally. Cardio: Regular rate and rhythm. GI: No hepatosplenomegaly. Vascular: No leg edema.  Lab Results:  Lab Results  Component Value Date   WBC 4.7 10/08/2019   HGB 14.2 10/08/2019   HCT 43.4 10/08/2019   MCV 89.7 10/08/2019   PLT 167 10/08/2019   NEUTROABS 2,848 10/08/2019    Imaging:  No results found.  Medications: I have reviewed the patient's current medications.  Assessment/Plan: 1. Squamous cell carcinoma of the esophagus, status post an endoscopic biopsy, 04/07/2009, with the pathology confirming at least superficial invasion. 2. PET scan, 04/20/2009, confirmed hypermetabolic activity associated with the upper and thoracic esophagus mass with a single hypermetabolic right paratracheal lymph node. He began concurrent radiation and weekly Taxol/carboplatin chemotherapy, 05/03/2009. The last treatment with Taxol/carboplatin was given 06/14/2009. He completed radiation on 06/16/2009.  3. Solid/liquid dysphagia secondary to the proximal esophageal mass, resolved. He is status post an esophageal dilatation for management of a stricture, 08/24/2009, repeat  esophageal dilatation 10/03/2011. 4. Borderline enlarged mediastinal and gastrohepatic lymph nodes on staging CT scans. PET scan 04/20/2009 confirmed hypermetabolic activity associated with the right paratracheal lymph node. 5. Hypertension. 6. Status post placement of a feeding jejunostomy tube by Dr. Johney Maine. The jejunostomy tube has been removed. 7. Admission with neutropenia and anemia, March 2011.  8. Fever of unknown origin, April 2011, resolved. He was treated with prednisone beginning on 07/13/2009 for presumed radiation pneumonitis. He developed episodes of rigors on prednisone, and the prednisone was discontinued, 07/15/2009. A CT scan did not reveal evidence for an infectious process. 9. Zoster rash at the right back and anterior chest July 2013. 10. Questionable leukoplakia on exam 04/09/2013-he was evaluated by dental medicine 11. Elevated PSA.  Followed by [name].    Disposition: Aaron [name] remains in clinical remission from esophagus cancer.  He does not want to schedule formal follow-up at this time.  We are available to see him in the future as needed.  He will continue follow-up with [name] regarding the elevated PSA.  Patient seen with Dr. Benay Spice.    Ned Card ANP/GNP-BC   04/29/2020  10:25 AM  This was a shared visit with Ned Card.  Aaron [name] has a remote history of esophagus cancer and remains in clinical remission.  He has a good prognosis for long-term disease-free survival.  I was present for today's visit and performed medical decision making.  Julieanne Manson, MD

## 2020-05-12 ENCOUNTER — Other Ambulatory Visit: Payer: Self-pay | Admitting: *Deleted

## 2020-05-12 MED ORDER — SENNOSIDES-DOCUSATE SODIUM 8.6-50 MG PO TABS: 2.0000 | ORAL_TABLET | Freq: Every day | ORAL | 3 refills | Status: DC | PRN

## 2020-06-15 ENCOUNTER — Other Ambulatory Visit: Payer: Self-pay

## 2020-06-15 ENCOUNTER — Ambulatory Visit (INDEPENDENT_AMBULATORY_CARE_PROVIDER_SITE_OTHER): Payer: Medicare Other | Admitting: Cardiovascular Disease

## 2020-06-15 ENCOUNTER — Encounter: Payer: Self-pay | Admitting: Cardiovascular Disease

## 2020-06-15 DIAGNOSIS — R972 Elevated prostate specific antigen [PSA]: Secondary | ICD-10-CM

## 2020-06-15 DIAGNOSIS — N1831 Chronic kidney disease, stage 3a: Secondary | ICD-10-CM | POA: Diagnosis not present

## 2020-06-15 DIAGNOSIS — E039 Hypothyroidism, unspecified: Secondary | ICD-10-CM

## 2020-06-15 DIAGNOSIS — R42 Dizziness and giddiness: Secondary | ICD-10-CM

## 2020-06-15 DIAGNOSIS — E785 Hyperlipidemia, unspecified: Secondary | ICD-10-CM | POA: Diagnosis not present

## 2020-06-15 DIAGNOSIS — I10 Essential (primary) hypertension: Secondary | ICD-10-CM

## 2020-06-15 NOTE — Patient Instructions (Signed)
Medication Instructions:  TAKE- Half tablet of Metoprolol one day Alternated with quarter tablet of metoprolol the next day  *If you need a refill on your cardiac medications before your next appointment, please call your pharmacy*   Lab Work: None Ordered   Testing/Procedures: None Ordered   Follow-Up: At [facility], you and your health needs are our priority.  As part of our continuing mission to provide you with exceptional heart care, we have created designated Provider Care Teams.  These Care Teams include your primary Cardiologist (physician) and Advanced Practice Providers (APPs -  Physician Assistants and Nurse Practitioners) who all work together to provide you with the care you need, when you need it.  We recommend signing up for the patient portal called "MyChart".  Sign up information is provided on this After Visit Summary.  MyChart is used to connect with patients for Virtual Visits (Telemedicine).  Patients are able to view lab/test results, encounter notes, upcoming appointments, etc.  Non-urgent messages can be sent to your provider as well.   To learn more about what you can do with MyChart, go to [email or web address].    Your next appointment:   6 month(s)  The format for your next appointment:   In Person  Provider:   You may see Shelva Majestic, MD or one of the following Advanced Practice Providers on your designated Care Team:    Almyra Deforest, PA-C  Fabian Sharp, PA-C or   Roby Lofts, [place]

## 2020-06-15 NOTE — Progress Notes (Deleted)
Cardiology Office Note:    Date:  06/15/2020   ID:  Aaron [name], DOB 1935/06/06, MRN [id number]  PCP:  Cassandria Anger, MD  [facility] Cardiologist:  Shelva Majestic, MD  [place] Electrophysiologist:  None   Referring MD: Cassandria Anger, MD   No chief complaint on file.   History of Present Illness:    Aaron [name] is a 85 y.o. male with a hx of CAD, COPD, history of esophageal cancer s/p chemo/radiation therapy, hypertension, hyperlipidemia and hypothyroidism.  Previous cardiac catheterization in June 2010 showed only 30% ostial LAD lesion, 20% OM1 lesion.  Previous echocardiogram obtained in October 2016 showed EF 55 to 60%, mild to moderate AI, mild TR and trivial PR.  Patient presents today for cardiology office visit.  Blood pressure is fairly controlled.  He does not have any lower extremity edema, orthopnea or PND.  He has been noticing some intermittent chest pain recently.  We discussed various options, although coronary CT will show more detail, however his poor renal function make me hesitant to proceed with CTA with contrast.  We eventually decided to proceed with Myoview.  If negative, he can follow-up with Dr. [name] in 6 months.   Past Medical History:  Diagnosis Date  . Allergic rhinitis   . Allergy   . Anxiety   . Arrhythmia   . CAD (coronary artery disease)    mild  . COPD (chronic obstructive pulmonary disease) ([place])   . Diverticulosis of colon   . Elevated glucose   . Esophageal cancer ([place]) 2011   squamous cell ca - had J-tube for some time, now removed   . Esophageal stricture   . Gastritis   . GERD (gastroesophageal reflux disease)   . Hemorrhoids   . Hiatal hernia   . History of chemotherapy 05/03/09-06/16/09   Taxol/carboplatin  . History of nuclear stress test 07/2008   exercise; mild-mod perfusion dfect in basal inf/mid inf/apical inf regions; EKG negative for ischemia  . History of radiation therapy 05/03/09-06/16/09   squamous  cell ca of esophagus  . Hyperlipidemia    [name]  . Hypothyroid    s/p radiotherapy    Past Surgical History:  Procedure Laterality Date  . CARDIAC CATHETERIZATION  08/2008   mild nonobstructive CAD with 30% LAD stenosis prox & 20% OM1 narrowing   . COLONOSCOPY    . CREATION OF CUTANEOUS STOMA  2011  . ESOPHAGOGASTRODUODENOSCOPY  10/03/2011   Procedure: ESOPHAGOGASTRODUODENOSCOPY (EGD);  Surgeon: [name], MD,FACG;  Location: [name];  Service: Endoscopy;  Laterality: N/A;  no fluro needed  . Lower Back Cyst Removed     [facility]  . POLYPECTOMY    . [name] DILATION  10/03/2011   Procedure: [name] DILATION;  Surgeon: [name], MD,FACG;  Location: [facility];  Service: Endoscopy;  Laterality: N/A;  . TONSILLECTOMY     age 32  . TRANSTHORACIC ECHOCARDIOGRAM  06/2009   EF=>55%; trace MR/TR  . UPPER GASTROINTESTINAL ENDOSCOPY      Current Medications: Current Meds  Medication Sig  . aspirin 81 MG EC tablet Take 81 mg by mouth daily.  . CHOLECALCIFEROL PO Take 1,000 Units by mouth daily.  . clonazePAM (KLONOPIN) 1 MG tablet TAKE 1/2 TO 1 TABLET BY  MOUTH TWICE DAILY AS NEEDED FOR ANXIETY  . clotrimazole-betamethasone (LOTRISONE) cream Apply topically 2 (two) times daily.  Marland Kitchen Dextromethorphan-guaiFENesin 20-400 MG TABS Take 1 tablet by mouth daily as needed.   . finasteride (PROSCAR)  5 MG tablet TAKE 1 TABLET BY MOUTH  DAILY  . levothyroxine (SYNTHROID) 125 MCG tablet TAKE 1 TABLET BY MOUTH  DAILY  . metoprolol succinate (TOPROL-XL) 25 MG 24 hr tablet TAKE ONE-HALF OF A TABLET BY MOUTH DAILY  . omeprazole (PRILOSEC) 40 MG capsule Take 1 capsule (40 mg total) by mouth daily.  . simvastatin (ZOCOR) 20 MG tablet TAKE 1 TABLET BY MOUTH  DAILY     Allergies:   Amoxicillin, Cefuroxime axetil, Fluconazole, and Escitalopram   Social History   Socioeconomic History  . Marital status: Married    Spouse name: Not on file  . Number of children: 3  . Years of  education: 12th  . Highest education level: Not on file  Occupational History  . Occupation: Retired, [place] [employer]    Employer: RETIRED    Comment: Supervisor  Tobacco Use  . Smoking status: Former Smoker    Packs/day: 1.00    Years: 40.00    Pack years: 40.00    Quit date: 01/16/2001    Years since quitting: 19.4  . Smokeless tobacco: Never Used  . Tobacco comment: Quit 2002  Vaping Use  . Vaping Use: Never used  Substance and Sexual Activity  . Alcohol use: Yes    Alcohol/week: 0.0 standard drinks    Comment: occasional drink 2-3 a month  . Drug use: No    Comment: quit smoking 2002  . Sexual activity: Yes  Other Topics Concern  . Not on file  Social History Narrative   Daily Caffeine - 1      Social Determinants of Health   Financial Resource Strain: Not on file  Food Insecurity: Not on file  Transportation Needs: Not on file  Physical Activity: Not on file  Stress: Not on file  Social Connections: Not on file     Family History: The patient's family history includes Colon cancer in his sister; Colon polyps in his brother; Diabetes in his father; Prostate cancer in his father; Prostate cancer (age of onset: 6) in his brother; Prostate cancer (age of onset: 4) in his brother; Stroke in his mother; Stroke (age of onset: 69) in his maternal grandfather. There is no history of Coronary artery disease, Hypertension, Stomach cancer, or Rectal cancer.  ROS:   Please see the history of present illness.     All other systems reviewed and are negative.  EKGs/Labs/Other Studies Reviewed:    The following studies were reviewed today:  Echo 12/30/2014 LV EF: 55% -  60%   -------------------------------------------------------------------  Indications:   785.2 Cardiac murmur (R01.1).   -------------------------------------------------------------------  History:  PMH: mild COPD, Hypothyroidism. Coronary artery  disease.    -------------------------------------------------------------------  Study Conclusions   - Left ventricle: Systolic function was normal. The estimated  ejection fraction was in the range of 55% to 60%. The study is  not technically sufficient to allow evaluation of LV diastolic  function.  - Aortic valve: There was mild to moderate regurgitation.  - Mitral valve: There was trivial regurgitation.  - Right ventricle: The cavity size was normal. Wall thickness was  normal. Systolic function was normal.  - Tricuspid valve: There was mild regurgitation.  - Pulmonic valve: There was trivial regurgitation.  - Inferior vena cava: The vessel was normal in size. The  respirophasic diameter changes were in the normal range (= 50%),  consistent with normal central venous pressure.    EKG:  EKG is ordered today.  The ekg ordered today demonstrates  normal sinus rhythm without significant ST-T wave changes.  Recent Labs: 10/08/2019: Hemoglobin 14.2; Platelets 167 04/20/2020: ALT 13; BUN 14; Creatinine, Ser 1.54; Potassium 4.0; Sodium 135; TSH 1.09  Recent Lipid Panel    Component Value Date/Time   CHOL 180 10/08/2019 1119   CHOL 150 12/05/2017 0942   TRIG 140 10/08/2019 1119   TRIG 120 02/26/2006 1420   HDL 64 10/08/2019 1119   HDL 57 12/05/2017 0942   CHOLHDL 2.8 10/08/2019 1119   VLDL 26.0 10/09/2018 1032   LDLCALC 92 10/08/2019 1119    Physical Exam:    VS:  BP 111/67 (BP Location: Right Arm, Patient Position: Sitting)   Pulse 79   Ht 6\' 3"  (1.905 m)   Wt 206 lb 9.6 oz (93.7 kg)   SpO2 97%   BMI 25.82 kg/m     Wt Readings from Last 3 Encounters:  06/15/20 206 lb 9.6 oz (93.7 kg)  04/29/20 208 lb 8 oz (94.6 kg)  04/27/20 207 lb (93.9 kg)     GEN:  Well nourished, well developed in no acute distress HEENT: Normal NECK: No JVD; No carotid bruits LYMPHATICS: No lymphadenopathy CARDIAC: RRR, no murmurs, rubs, gallops RESPIRATORY:  Clear to auscultation without  rales, wheezing or rhonchi  ABDOMEN: Soft, non-tender, non-distended MUSCULOSKELETAL:  No edema; No deformity  SKIN: Warm and dry NEUROLOGIC:  Alert and oriented x 3 PSYCHIATRIC:  Normal affect   ASSESSMENT:    No diagnosis found. PLAN:    In order of problems listed above:  1. Chest pain: Symptom does not have clear correlation with the degree of exertion and can occur at any time.  I recommend a Myoview as initial evaluation.  Although coronary CT will give [place] more details, I am hesitant to proceed with CTA with contrast due to his renal function and the risk of contrast nephropathy  2. CAD: Previous cardiac catheterization in 2010 showed nonobstructive disease  3. COPD: No recent exacerbation  4. Hypertension: Blood pressure well controlled  5. Hyperlipidemia: On Zocor  6. Hypothyroidism: On levothyroxine.   Medication Adjustments/Labs and Tests Ordered: Current medicines are reviewed at length with the patient today.  Concerns regarding medicines are outlined above.  No orders of the defined types were placed in this encounter.  No orders of the defined types were placed in this encounter.   There are no Patient Instructions on file for this visit.   Signed, Shelva Majestic, MD  06/15/2020 10:44 AM    [place]

## 2020-06-15 NOTE — Progress Notes (Unsigned)
Patient ID: Aaron [name], male   DOB: 01-21-1936, 85 y.o.   MRN: [id number]     HPI: Aaron [name] is a 85 y.o. male presents to the office today for a 36 month cardiology evaluation.  Aaron [name] has a history of mild CAD with 30% ostial LAD stenosis and 20% OM1 narrowing noted at cardiac catheterization in June 2010. Additional problems include hypertension with mild diastolic dysfunction, hyperlipidemia, and history of esophageal cancer, status post chemotherapy radiation therapy, history of hypothyroidism for which he is on thyroid replacement therapy. Over the past year, Aaron [name] has continued to do well. 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  He denies any change in exercise tolerance.  He denies chest pressure.  He denies palpitations.  He denies presyncope or syncope.  He denies edema.  He does have a history of diverticulosis and mild COPD.  He tells me he recently saw [name] who felt that he was stable with reference to his remote esophageal CA and that there was no need to do any repeat endoscopy.   He underwent a 2-D echo Doppler study in October 2016 which showed an EF of 55-60%.  Mild to moderate aortic insufficiency, mild TR and trivial PR.  I saw him June 2018 at which time he was remaining stable.  I last saw him in September 2019.  He remained active and did not have complaints and was working 4 days/week, 6 hours/day [name].He denies recurrent chest pain.  He continues to see [name] in follow-up of his esophageal cancer and has had follow-up endoscopy with [name].    I saw him in September 2020 at which time he was no longer working since the COVID-19 pandemic.  He had noticed occasional episodes of palpitations as well as a slight increase in his resting heart rate.  He sees [name] who has checked his laboratory.  He also was evaluated by [name] for his prostate.  He denies any chest pain.  He has been on simvastatin for hyperlipidemia.  He continues to be on levothyroxine 125 mcg for hypothyroidism.  He denies chest pain PND orthopnea, presyncope or syncope.  There is a history of diverticulosis and mild COPD.  During that evaluation, with his episodes of occasional increased heart rate I suggested the addition of low-dose metoprolol succinate initially at 12.5 mg to take at bedtime to be helpful to control his palpitations.  With hemoglobin A1c at 6.7 I recommended follow-up with [name] for diabetes mellitus.  I saw him in March 2021 at which time he continued to feel well.  He noted significant improvement in palpitations since the initiation of Toprol-XL 12.5 mg.  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    Since I last saw him, he was evaluated by Almyra Deforest, PA in September 2021 and had some mild complaints of nonexertional chest pain.  He subsequently was referred for a nuclear perfusion study which was low risk and done on December 29, 2019 and suggested possible small prior infarct versus prominent diaphragmatic attenuation.  There was no ischemia.  EF was 52%.  Presently, he feels well.  He denies any recurrent chest pain.  At times he does experience some very mild dizziness.  He denies any syncope.  His blood pressure at home typically runs around 552 systolic.  He continues to be on metoprolol succinate 12.5 mg and denies any awareness of palpitations.  He is on simvastatin 20 mg for hyperlipidemia and levothyroxine 1 and 25 mcg for hypothyroidism.  He presents for evaluation.  Past Medical History:  Diagnosis Date  . Allergic rhinitis   . Allergy   . Anxiety   . Arrhythmia   . CAD (coronary artery disease)    mild  . COPD (chronic obstructive pulmonary disease) ([place])   . Diverticulosis of colon   . Elevated glucose   .  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 Esophageal stricture   . Gastritis   . GERD (gastroesophageal reflux disease)   . Hemorrhoids   . Hiatal hernia   . History of chemotherapy 05/03/09-06/16/09   Taxol/carboplatin  . History of nuclear stress test 07/2008   exercise; mild-mod perfusion dfect in basal inf/mid inf/apical inf regions; EKG negative for ischemia  . History of radiation therapy 05/03/09-06/16/09   squamous cell ca of esophagus  . Hyperlipidemia    [name]  . Hypothyroid    s/p radiotherapy    Past Surgical History:  Procedure Laterality Date  . CARDIAC CATHETERIZATION  08/2008   mild nonobstructive CAD with 30% LAD stenosis prox & 20% OM1 narrowing   . COLONOSCOPY    . CREATION OF CUTANEOUS STOMA  2011  . ESOPHAGOGASTRODUODENOSCOPY  10/03/2011   Procedure: ESOPHAGOGASTRODUODENOSCOPY (EGD);  Surgeon: [name], MD,FACG;  Location: [name];  Service: Endoscopy;  Laterality: N/A;  no fluro needed  . Lower Back Cyst Removed     [facility]  . POLYPECTOMY    . [name] DILATION  10/03/2011   Procedure: [name] DILATION;  Surgeon: [name], MD,FACG;  Location: [facility];  Service: Endoscopy;  Laterality: N/A;  . TONSILLECTOMY     age 64  . TRANSTHORACIC ECHOCARDIOGRAM  06/2009   EF=>55%; trace MR/TR  . UPPER GASTROINTESTINAL ENDOSCOPY      Allergies  Allergen Reactions  . Amoxicillin     REACTION: rash  . Cefuroxime Axetil     REACTION: nit feeling well  . Fluconazole     REACTION: shaking  . Escitalopram Other (See Comments)    Nightmares, numbness in tongue, dizziness    Current Outpatient Medications  Medication Sig Dispense Refill  . aspirin 81 MG EC tablet Take 81 mg by mouth daily.    . CHOLECALCIFEROL PO Take 1,000 Units by mouth daily.    . clonazePAM (KLONOPIN) 1 MG tablet TAKE 1/2 TO 1 TABLET BY  MOUTH TWICE DAILY AS NEEDED FOR ANXIETY 180 tablet 1  . clotrimazole-betamethasone  (LOTRISONE) cream Apply topically 2 (two) times daily. 90 g 1  . Dextromethorphan-guaiFENesin 20-400 MG TABS Take 1 tablet by mouth daily as needed.     . finasteride (PROSCAR) 5 MG tablet TAKE 1 TABLET BY MOUTH  DAILY 90 tablet 1  . levothyroxine (SYNTHROID) 125 MCG tablet TAKE 1 TABLET BY MOUTH  DAILY 90 tablet 2  . metoprolol succinate (TOPROL-XL) 25 MG 24 hr tablet TAKE ONE-HALF OF A TABLET BY MOUTH DAILY 90 tablet 2  . omeprazole (PRILOSEC) 40 MG capsule Take 1 capsule (40 mg total) by mouth daily. 90 capsule 3  . simvastatin (ZOCOR) 20 MG tablet TAKE 1 TABLET BY MOUTH  DAILY 90 tablet 2   No current facility-administered medications for this  visit.    Social History   Socioeconomic History  . Marital status: Married    Spouse name: Not on file  . Number of children: 3  . Years of education: 12th  . Highest education level: Not on file  Occupational History  . Occupation: Retired, [place] [employer]    Employer: RETIRED    Comment: Supervisor  Tobacco Use  . Smoking status: Former Smoker    Packs/day: 1.00    Years: 40.00    Pack years: 40.00    Quit date: 01/16/2001    Years since quitting: 19.4  . Smokeless tobacco: Never Used  . Tobacco comment: Quit 2002  Vaping Use  . Vaping Use: Never used  Substance and Sexual Activity  . Alcohol use: Yes    Alcohol/week: 0.0 standard drinks    Comment: occasional drink 2-3 a month  . Drug use: No    Comment: quit smoking 2002  . Sexual activity: Yes  Other Topics Concern  . Not on file  Social History Narrative   Daily Caffeine - 1      Social Determinants of Health   Financial Resource Strain: Not on file  Food Insecurity: Not on file  Transportation Needs: Not on file  Physical Activity: Not on file  Stress: Not on file  Social Connections: Not on file  Intimate Partner Violence: Not on file    Family History  Problem Relation Age of Onset  . Prostate cancer Father        61's  . Diabetes Father   .  Stroke Mother        aneurism  . Stroke Maternal Grandfather 35  . Prostate cancer Brother 55  . Colon polyps Brother   . Prostate cancer Brother 7  . Colon cancer Sister   . Coronary artery disease Neg Hx   . Hypertension Neg Hx   . Stomach cancer Neg Hx   . Rectal cancer Neg Hx     ROS General: Negative; No fevers, chills, or night sweats;  HEENT: Negative; No changes in vision or hearing, sinus congestion, difficulty swallowing Pulmonary: Negative; No cough, wheezing, shortness of breath, hemoptysis Cardiovascular: see HPI GI: Remote history of esophageal cancer, status post chemotherapy and radiation treatment GU: Recently evaluated by [name] Musculoskeletal: Negative; no myalgias, joint pain, or weakness Hematologic/Oncology: Negative; no easy bruising, bleeding Endocrine: Positive for hypothyroidism on Synthroid replacement Neuro: Negative; no changes in balance, headaches Skin: Negative; No rashes or skin lesions Psychiatric: Negative; No behavioral problems, depression Sleep: Negative; No snoring, daytime sleepiness, hypersomnolence, bruxism, restless legs, hypnogognic hallucinations, no cataplexy Other comprehensive 14 point system review is negative.  PE BP 111/67 (BP Location: Right Arm, Patient Position: Sitting)   Pulse 79   Ht 6' 3" (1.905 m)   Wt 206 lb 9.6 oz (93.7 kg)   SpO2 97%   BMI 25.82 kg/m    Repeat blood pressure by me was 104/68 supine and 96/64 standing  Wt Readings from Last 3 Encounters:  06/15/20 206 lb 9.6 oz (93.7 kg)  04/29/20 208 lb 8 oz (94.6 kg)  04/27/20 207 lb (93.9 kg)   General: Alert, oriented, no distress.  Skin: normal turgor, no rashes, warm and dry HEENT: Normocephalic, atraumatic. Pupils equal round and reactive to light; sclera anicteric; extraocular muscles intact;  Nose without nasal septal hypertrophy Mouth/Parynx benign; Mallinpatti scale 3 Neck: No JVD, no carotid bruits; normal carotid upstroke Lungs: clear to  ausculatation and percussion; no wheezing or  rales Chest wall: without tenderness to palpitation Heart: PMI not displaced, RRR, s1 s2 normal, 1/6 systolic murmur, no diastolic murmur, no rubs, gallops, thrills, or heaves Abdomen: soft, nontender; no hepatosplenomehaly, BS+; abdominal aorta nontender and not dilated by palpation. Back: no CVA tenderness Pulses 2+ Musculoskeletal: full range of motion, normal strength, no joint deformities Extremities: no clubbing cyanosis or edema, Homan's sign negative  Neurologic: grossly nonfocal; Cranial nerves grossly wnl Psychologic: Normal mood and affect   ECG (independently read by me): NSR at 79, no ectopy; normal intervals  March 2021 ECG (independently read by me): Sinus rhythm at 81 bpm with an isolated PVC normal intervals    September 2020 ECG (independently read by me): Sinus rhythm at 95 bpm with occasional PVCs with right bundle morphology; normal intervals.  September 2019 ECG (independently read by me): Normal sinus rhythm at 76 bpm.  One isolated PVC.  No ST segment changes.  Normal intervals.  June 2019 ECG (independently read by me): Normal sinus rhythm at 79 bpm.  No ectopy.  No ST segment changes.  May 2017 ECG (independently read by me): Normal sinus rhythm at 76 bpm.  PR interval 176 ms.  QTc interval 411 milliseconds.  No ST segment changes.  September 2016 ECG (independently read by me): Normal sinus rhythm at 87 bpm.  No ectopy.  Normal intervals.  No ST segment changes.  12/17/2013 ECG (independently read by me): Normal sinus rhythm at 65 beats per minute.  Normal intervals.  No ST segment changes.  Prior September 2014 ECG: Normal sinus rhythm at 60 beats per minute. Intervals are normal.  LABS:  BMP Latest Ref Rng & Units 04/20/2020 10/08/2019 04/09/2019  Glucose 70 - 99 mg/dL 107(H) 100(H) 108(H)  BUN 6 - 23 mg/dL _0 Creatinine 0.40 - 1.50 mg/dL 1.54(H) 1.54(H) 1.58(H)  BUN/Creat Ratio 6 - 22 (calc) - 11 -   Sodium 135 - 145 mEq/L 135 139 138  Potassium 3.5 - 5.1 mEq/L 4.0 5.0 3.9  Chloride 96 - 112 mEq/L 99 100 100  CO2 19 - 32 mEq/L _1 Calcium 8.4 - 10.5 mg/dL 10.2 10.7(H) 10.3   Hepatic Function Latest Ref Rng & Units 04/20/2020 10/08/2019 11/29/2016  Total Protein 6.0 - 8.3 g/dL 8.1 8.2(H) 7.8  Albumin 3.5 - 5.2 g/dL 4.5 - 4.5  AST 0 - 37 U/L _2 ALT 0 - 53 U/L _3 Alk Phosphatase 39 - 117 U/L 68 - 77  Total Bilirubin 0.2 - 1.2 mg/dL 0.5 0.4 0.4  Bilirubin, Direct 0.0 - 0.2 mg/dL - 0.1 0.1   CBC Latest Ref Rng & Units 10/08/2019 10/09/2018 04/04/2017  WBC 3.8 - 10.8 Thousand/uL 4.7 4.0 3.9(L)  Hemoglobin 13.2 - 17.1 g/dL 14.2 14.7 14.5  Hematocrit 38.5 - 50.0 % 43.4 44.1 44.1  Platelets 140 - 400 Thousand/uL 167 170.0 178.0   Lab Results  Component Value Date   MCV 89.7 10/08/2019   MCV 91.4 10/09/2018   MCV 90.0 04/04/2017   Lab Results  Component Value Date   TSH 1.09 04/20/2020   Lab Results  Component Value Date   HGBA1C 6.6 (H) 04/20/2020   Lipid Panel     Component Value Date/Time   CHOL 180 10/08/2019 1119   CHOL 150 12/05/2017 0942   TRIG 140 10/08/2019 1119   TRIG 120 02/26/2006 1420   HDL 64 10/08/2019 1119   HDL 57 12/05/2017 0942   CHOLHDL 2.8 10/08/2019  1119   VLDL 26.0 10/09/2018 1032   LDLCALC 92 10/08/2019 1119   IMPRESSION:  1. Mild CAD: cath 08/2008   2. Hyperlipidemia LDL goal <70   3. Dizziness   4. Hypothyroidism, unspecified type   5. Stage 3a chronic kidney disease ([place])   6. PSA elevation     ASSESSMENT AND PLAN: Mr. Rathe is an active 85 year-old African-American male who has documented mild CAD by cardiac catheterization in June 2010 and has remained stable on medical therapy.  He denies any recurrent anginal symptomatology and has continued to be active working in his yard.  Due to palpitations, he had been started with metoprolol succinate 12.5 mg and has continued to do exceptionally well on this therapy with  resolution of prior symptomatology.  He had developed nonexertional somewhat atypical chest discomfort and was seen by Almyra [name], [place] in September 2021.  A Lexiscan Myoview study was low risk and did not show any ischemia.  Presently, he is doing well but he has noticed some rare episodes where he may get a little dizzy.  His blood pressure is low today and did drop very slightly going from supine to standing.  His ECG shows sinus rhythm without ectopy.  Since he is currently on metoprolol 12.5 mg daily, I have suggested he try alternating this with 6.25 mg every other day.  He will monitor his blood pressure at home.  If he continues to experience episodic dizziness he will then lower the dose to 6.25 mg (a quarter of a metoprolol succinate 25 mg pill).  I reviewed recent laboratory from January 2022.  He has mild renal insufficiency, which has been stable with a creatinine of 1.54 consistent with stage IIIa CKD.  He continues to be on levothyroxine for hypothyroidism and TSH was 1.09.  He has been followed for elevated PSA by [name].  I will see him in 6 months for follow-up evaluation or sooner if necessary.   Troy Sine, MD, [facility]  06/16/2020 6:50 PM

## 2020-06-16 ENCOUNTER — Encounter: Payer: Self-pay | Admitting: Cardiovascular Disease

## 2020-06-17 ENCOUNTER — Telehealth: Payer: Self-pay | Admitting: Internal Medicine

## 2020-06-17 MED ORDER — CLOTRIMAZOLE-BETAMETHASONE 1-0.05 % EX CREA: TOPICAL_CREAM | Freq: Two times a day (BID) | CUTANEOUS | 1 refills | Status: DC

## 2020-06-17 NOTE — Telephone Encounter (Signed)
Patient requesting refill for clotrimazole-betamethasone (LOTRISONE) cream  [place], Aulander Bode

## 2020-06-17 NOTE — Telephone Encounter (Signed)
Reviewed chart pt is up-to-date sent refills to [facility].../lmb  

## 2020-06-29 ENCOUNTER — Other Ambulatory Visit: Payer: Self-pay | Admitting: *Deleted

## 2020-06-30 MED ORDER — CLONAZEPAM 1 MG PO TABS: 0.5000 mg | ORAL_TABLET | Freq: Two times a day (BID) | ORAL | 1 refills | Status: DC | PRN

## 2020-07-06 DIAGNOSIS — R972 Elevated prostate specific antigen [PSA]: Secondary | ICD-10-CM | POA: Diagnosis not present

## 2020-07-13 DIAGNOSIS — N401 Enlarged prostate with lower urinary tract symptoms: Secondary | ICD-10-CM | POA: Diagnosis not present

## 2020-07-13 DIAGNOSIS — R972 Elevated prostate specific antigen [PSA]: Secondary | ICD-10-CM | POA: Diagnosis not present

## 2020-07-13 DIAGNOSIS — R3912 Poor urinary stream: Secondary | ICD-10-CM | POA: Diagnosis not present

## 2020-08-03 DIAGNOSIS — H25013 Cortical age-related cataract, bilateral: Secondary | ICD-10-CM | POA: Diagnosis not present

## 2020-08-03 DIAGNOSIS — H2513 Age-related nuclear cataract, bilateral: Secondary | ICD-10-CM | POA: Diagnosis not present

## 2020-08-03 DIAGNOSIS — H40022 Open angle with borderline findings, high risk, left eye: Secondary | ICD-10-CM | POA: Diagnosis not present

## 2020-08-03 DIAGNOSIS — H401211 Low-tension glaucoma, right eye, mild stage: Secondary | ICD-10-CM | POA: Diagnosis not present

## 2020-08-03 LAB — HM DIABETES EYE EXAM

## 2020-08-05 DIAGNOSIS — Z23 Encounter for immunization: Secondary | ICD-10-CM | POA: Diagnosis not present

## 2020-08-08 ENCOUNTER — Encounter: Payer: Self-pay | Admitting: Internal Medicine

## 2020-08-12 ENCOUNTER — Other Ambulatory Visit: Payer: Self-pay

## 2020-08-12 ENCOUNTER — Ambulatory Visit (INDEPENDENT_AMBULATORY_CARE_PROVIDER_SITE_OTHER): Payer: Medicare Other

## 2020-08-12 VITALS — BP 120/70 | HR 84 | Temp 98.2°F | Resp 16 | Ht 75.0 in | Wt 206.8 lb

## 2020-08-12 DIAGNOSIS — Z0000 Encounter for general adult medical examination without abnormal findings: Secondary | ICD-10-CM | POA: Diagnosis not present

## 2020-08-12 NOTE — Patient Instructions (Signed)
[name] , Thank you for taking time to come for your Medicare Wellness Visit. I appreciate your ongoing commitment to your health goals. Please review the following plan we discussed and let me know if I can assist you in the future.   Screening recommendations/referrals: Colonoscopy: 01/31/2012; no repeat due to age Recommended yearly ophthalmology/optometry visit for glaucoma screening and checkup Recommended yearly dental visit for hygiene and checkup  Vaccinations: Influenza vaccine: 12/10/2019 Pneumococcal vaccine: 01/22/2013, 09/01/2015 Tdap vaccine: 03/29/2016; due every 10 years Shingles vaccine: 1st dose done at [facility]; 2nd dose in 4 months Covid-19: 04/16/2019, 05/07/2019, 01/06/2020, 07/29/2020  Advanced directives: Please bring a copy of your health care power of attorney and living will to the office at your convenience.  Conditions/risks identified: Yes; Reviewed health maintenance screenings with patient today and relevant education, vaccines, and/or referrals were provided. Please continue to do your personal lifestyle choices by: daily care of teeth and gums, regular physical activity (goal should be 5 days a week for 30 minutes), eat a healthy diet, avoid tobacco and drug use, limiting any alcohol intake, taking a low-dose aspirin (if not allergic or have been advised by your provider otherwise) and taking vitamins and minerals as recommended by your provider. Continue doing brain stimulating activities (puzzles, reading, adult coloring books, staying active) to keep memory sharp. Continue to eat heart healthy diet (full of fruits, vegetables, whole grains, lean protein, water--limit salt, fat, and sugar intake) and increase physical activity as tolerated.  Next appointment: Please schedule your next Medicare Wellness Visit with your Nurse Health Advisor in 1 year by calling [phone].  Preventive Care 2 Years and Older, Male Preventive care refers to lifestyle choices and  visits with your health care provider that can promote health and wellness. What does preventive care include?  A yearly physical exam. This is also called an annual well check.  Dental exams once or twice a year.  Routine eye exams. Ask your health care provider how often you should have your eyes checked.  Personal lifestyle choices, including:  Daily care of your teeth and gums.  Regular physical activity.  Eating a healthy diet.  Avoiding tobacco and drug use.  Limiting alcohol use.  Practicing safe sex.  Taking low doses of aspirin every day.  Taking vitamin and mineral supplements as recommended by your health care provider. What happens during an annual well check? The services and screenings done by your health care provider during your annual well check will depend on your age, overall health, lifestyle risk factors, and family history of disease. Counseling  Your health care provider may ask you questions about your:  Alcohol use.  Tobacco use.  Drug use.  Emotional well-being.  Home and relationship well-being.  Sexual activity.  Eating habits.  History of falls.  Memory and ability to understand (cognition).  Work and work Statistician. Screening  You may have the following tests or measurements:  Height, weight, and BMI.  Blood pressure.  Lipid and cholesterol levels. These may be checked every 5 years, or more frequently if you are over 50 years old.  Skin check.  Lung cancer screening. You may have this screening every year starting at age 42 if you have a 30-pack-year history of smoking and currently smoke or have quit within the past 15 years.  Fecal occult blood test (FOBT) of the stool. You may have this test every year starting at age 76.  Flexible sigmoidoscopy or colonoscopy. You may have a sigmoidoscopy every 5  years or a colonoscopy every 10 years starting at age 28.  Prostate cancer screening. Recommendations will vary  depending on your family history and other risks.  Hepatitis C blood test.  Hepatitis B blood test.  Sexually transmitted disease (STD) testing.  Diabetes screening. This is done by checking your blood sugar (glucose) after you have not eaten for a while (fasting). You may have this done every 1-3 years.  Abdominal aortic aneurysm (AAA) screening. You may need this if you are a current or former smoker.  Osteoporosis. You may be screened starting at age 17 if you are at high risk. Talk with your health care provider about your test results, treatment options, and if necessary, the need for more tests. Vaccines  Your health care provider may recommend certain vaccines, such as:  Influenza vaccine. This is recommended every year.  Tetanus, diphtheria, and acellular pertussis (Tdap, Td) vaccine. You may need a Td booster every 10 years.  Zoster vaccine. You may need this after age 80.  Pneumococcal 13-valent conjugate (PCV13) vaccine. One dose is recommended after age 32.  Pneumococcal polysaccharide (PPSV23) vaccine. One dose is recommended after age 55. Talk to your health care provider about which screenings and vaccines you need and how often you need them. This information is not intended to replace advice given to you by your health care provider. Make sure you discuss any questions you have with your health care provider. Document Released: 04/08/2015 Document Revised: 11/30/2015 Document Reviewed: 01/11/2015 Elsevier Interactive Patient Education  2017 [place] Prevention in the Home Falls can cause injuries. They can happen to people of all ages. There are many things you can do to make your home safe and to help prevent falls. What can I do on the outside of my home?  Regularly fix the edges of walkways and driveways and fix any cracks.  Remove anything that might make you trip as you walk through a door, such as a raised step or threshold.  Trim any bushes  or trees on the path to your home.  Use bright outdoor lighting.  Clear any walking paths of anything that might make someone trip, such as rocks or tools.  Regularly check to see if handrails are loose or broken. Make sure that both sides of any steps have handrails.  Any raised decks and porches should have guardrails on the edges.  Have any leaves, snow, or ice cleared regularly.  Use sand or salt on walking paths during winter.  Clean up any spills in your garage right away. This includes oil or grease spills. What can I do in the bathroom?  Use night lights.  Install grab bars by the toilet and in the tub and shower. Do not use towel bars as grab bars.  Use non-skid mats or decals in the tub or shower.  If you need to sit down in the shower, use a plastic, non-slip stool.  Keep the floor dry. Clean up any water that spills on the floor as soon as it happens.  Remove soap buildup in the tub or shower regularly.  Attach bath mats securely with double-sided non-slip rug tape.  Do not have throw rugs and other things on the floor that can make you trip. What can I do in the bedroom?  Use night lights.  Make sure that you have a light by your bed that is easy to reach.  Do not use any sheets or blankets that are too big for  your bed. They should not hang down onto the floor.  Have a firm chair that has side arms. You can use this for support while you get dressed.  Do not have throw rugs and other things on the floor that can make you trip. What can I do in the kitchen?  Clean up any spills right away.  Avoid walking on wet floors.  Keep items that you use a lot in easy-to-reach places.  If you need to reach something above you, use a strong step stool that has a grab bar.  Keep electrical cords out of the way.  Do not use floor polish or wax that makes floors slippery. If you must use wax, use non-skid floor wax.  Do not have throw rugs and other things on  the floor that can make you trip. What can I do with my stairs?  Do not leave any items on the stairs.  Make sure that there are handrails on both sides of the stairs and use them. Fix handrails that are broken or loose. Make sure that handrails are as long as the stairways.  Check any carpeting to make sure that it is firmly attached to the stairs. Fix any carpet that is loose or worn.  Avoid having throw rugs at the top or bottom of the stairs. If you do have throw rugs, attach them to the floor with carpet tape.  Make sure that you have a light switch at the top of the stairs and the bottom of the stairs. If you do not have them, ask someone to add them for you. What else can I do to help prevent falls?  Wear shoes that:  Do not have high heels.  Have rubber bottoms.  Are comfortable and fit you well.  Are closed at the toe. Do not wear sandals.  If you use a stepladder:  Make sure that it is fully opened. Do not climb a closed stepladder.  Make sure that both sides of the stepladder are locked into place.  Ask someone to hold it for you, if possible.  Clearly mark and make sure that you can see:  Any grab bars or handrails.  First and last steps.  Where the edge of each step is.  Use tools that help you move around (mobility aids) if they are needed. These include:  Canes.  Walkers.  Scooters.  Crutches.  Turn on the lights when you go into a dark area. Replace any light bulbs as soon as they burn out.  Set up your furniture so you have a clear path. Avoid moving your furniture around.  If any of your floors are uneven, fix them.  If there are any pets around you, be aware of where they are.  Review your medicines with your doctor. Some medicines can make you feel dizzy. This can increase your chance of falling. Ask your doctor what other things that you can do to help prevent falls. This information is not intended to replace advice given to you by  your health care provider. Make sure you discuss any questions you have with your health care provider. Document Released: 01/06/2009 Document Revised: 08/18/2015 Document Reviewed: 04/16/2014 Elsevier Interactive Patient Education  2017 [employer].

## 2020-08-12 NOTE — Progress Notes (Addendum)
Subjective:   Aaron [name] is a 85 y.o. male who presents for Medicare Annual/Subsequent preventive examination.  Review of Systems    No ROS. Medicare Wellness Visit. Additional risk factors are reflected in social history. Cardiac Risk Factors include: family history of premature cardiovascular disease;advanced age (>52men, >74 women);dyslipidemia;hypertension;male gender Sleep Patterns: No sleep issues, feels rested on waking and sleeps 8 hours nightly. Home Safety/Smoke Alarms: Feels safe in home; uses home alarm. Smoke alarms in place. Living environment: Split level home; Lives with spouse; no needs for DME; good support system. Seat Belt Safety/Bike Helmet: Wears seat belt.    Objective:    Today's Vitals   08/12/20 1000  BP: 120/70  Pulse: 84  Resp: 16  Temp: 98.2 F (36.8 C)  SpO2: 97%  Weight: 206 lb 12.8 oz (93.8 kg)  Height: 6\' 3"  (1.905 m)  PainSc: 0-No pain   Body mass index is 25.85 kg/m.  Advanced Directives 08/12/2020 07/24/2019 05/03/2016 04/28/2015 10/03/2011  Does Patient Have a Medical Advance Directive? Yes Yes No No Patient does not have advance directive  Type of Advance Directive Living will;Healthcare Power of [place];Living will - - -  Does patient want to make changes to medical advance directive? No - Patient declined No - Patient declined - - -  Copy of [place] in Chart? No - copy requested No - copy requested - - -  Would patient like information on creating a medical advance directive? - - - No - patient declined information -    Current Medications (verified) Outpatient Encounter Medications as of 08/12/2020  Medication Sig   aspirin 81 MG EC tablet Take 81 mg by mouth daily.   CHOLECALCIFEROL PO Take 1,000 Units by mouth daily.   clonazePAM (KLONOPIN) 1 MG tablet Take 0.5-1 tablets (0.5-1 mg total) by mouth 2 (two) times daily as needed. for anxiety   clotrimazole-betamethasone (LOTRISONE)  cream Apply topically 2 (two) times daily.   Dextromethorphan-guaiFENesin 20-400 MG TABS Take 1 tablet by mouth daily as needed.    finasteride (PROSCAR) 5 MG tablet TAKE 1 TABLET BY MOUTH  DAILY   levothyroxine (SYNTHROID) 125 MCG tablet TAKE 1 TABLET BY MOUTH  DAILY   metoprolol succinate (TOPROL-XL) 25 MG 24 hr tablet TAKE ONE-HALF OF A TABLET BY MOUTH DAILY   omeprazole (PRILOSEC) 40 MG capsule Take 1 capsule (40 mg total) by mouth daily.   simvastatin (ZOCOR) 20 MG tablet TAKE 1 TABLET BY MOUTH  DAILY   No facility-administered encounter medications on file as of 08/12/2020.    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, now removed    Esophageal stricture    Gastritis    GERD (gastroesophageal reflux disease)    Hemorrhoids    Hiatal hernia    History of chemotherapy 05/03/09-06/16/09   Taxol/carboplatin   History of nuclear stress test 07/2008   exercise; mild-mod perfusion dfect in basal inf/mid inf/apical inf regions; EKG negative for ischemia   History of radiation therapy 05/03/09-06/16/09   squamous cell ca of  esophagus   Hyperlipidemia    Dr Aaron Billings   Hypothyroid    s/p radiotherapy   Past Surgical History:  Procedure Laterality Date   CARDIAC CATHETERIZATION  08/2008   mild nonobstructive CAD with 30% LAD stenosis prox & 20% OM1 narrowing    COLONOSCOPY     CREATION OF CUTANEOUS STOMA  2011   ESOPHAGOGASTRODUODENOSCOPY  10/03/2011   Procedure: ESOPHAGOGASTRODUODENOSCOPY (EGD);  Surgeon: [name], MD,FACG;  Location: [name];  Service: Endoscopy;  Laterality: N/A;  no fluro needed   Lower Back  Cyst Removed     [facility]   POLYPECTOMY     [name] DILATION  10/03/2011   Procedure: [name] DILATION;  Surgeon: [name], MD,FACG;  Location: [facility];  Service: Endoscopy;  Laterality: N/A;   TONSILLECTOMY     age 56   TRANSTHORACIC ECHOCARDIOGRAM  06/2009   EF=>55%; trace MR/TR   UPPER GASTROINTESTINAL ENDOSCOPY     Family History  Problem Relation Age of Onset   Prostate cancer Father        59's   Diabetes Father    Stroke Mother        aneurism   Stroke Maternal Grandfather 74   Prostate cancer Brother 25   Colon polyps Brother    Prostate cancer Brother 61   Colon cancer Sister    Coronary artery disease Neg Hx    Hypertension Neg Hx    Stomach cancer Neg Hx    Rectal cancer Neg Hx    Social History   Socioeconomic History   Marital status: Married    Spouse name: Not on file   Number of children: 3   Years of education: 12th   Highest education level: Not on file  Occupational History   Occupation: Retired, [place] [employer]    Employer: RETIRED    Comment: Supervisor  Tobacco Use   Smoking status: Former Smoker    Packs/day: 1.00    Years: 40.00    Pack years: 40.00    Quit date: 01/16/2001    Years since quitting: 19.5   Smokeless tobacco: Never Used   Tobacco comment: Quit 2002  Vaping Use   Vaping Use: Never used  Substance and Sexual Activity   Alcohol use: Yes    Alcohol/week: 0.0 standard drinks    Comment: occasional drink 2-3 a month   Drug use: No    Comment: quit smoking 2002   Sexual activity: Yes  Other Topics Concern   Not on file  Social History Narrative   Daily Caffeine - 1      Social Determinants of Health   Financial Resource Strain: Low Risk    Difficulty of Paying Living Expenses: Not hard at all  Food Insecurity: No Food Insecurity   Worried About Charity fundraiser in the Last Year: Never true   Ran Out of Food in the Last Year: Never true  Transportation Needs: No Transportation Needs   Lack of  Transportation (Medical): No   Lack of Transportation (Non-Medical): No  Physical Activity: Sufficiently Active   Days of Exercise per Week: 5 days   Minutes of Exercise per Session: 30 min  Stress: No Stress Concern Present   Feeling of Stress : Not at all  Social Connections: Socially Integrated   Frequency of Communication with Friends and Family: More than three times a week   Frequency of Social Gatherings with Friends and Family: More than three times a week  Attends Religious Services: More than 4 times per year   Active Member of Clubs or Organizations: No   Attends Music therapist: More than 4 times per year   Marital Status: Married    Tobacco Counseling Counseling given: Not Answered Comment: Quit 2002   Clinical Intake:  Pre-visit preparation completed: Yes  Pain : No/denies pain Pain Score: 0-No pain     BMI - recorded: 25.85 Nutritional Risks: None Diabetes: No  How often do you need to have someone help you when you read instructions, pamphlets, or other written materials from your doctor or pharmacy?: 2 - Rarely What is the last grade level you completed in school?: High School Graduate  Diabetic? no  Interpreter Needed?: No  Information entered by :: Lisette Abu, LPN   Activities of Daily Living In your present state of health, do you have any difficulty performing the following activities: 08/12/2020  Hearing? N  Vision? N  Difficulty concentrating or making decisions? N  Walking or climbing stairs? N  Dressing or bathing? N  Doing errands, shopping? N  Preparing Food and eating ? N  Using the Toilet? N  In the past six months, have you accidently leaked urine? N  Do you have problems with loss of bowel control? N  Managing your Medications? N  Managing your Finances? N  Housekeeping or managing your Housekeeping? N  Some recent data might be hidden    Patient Care Team: Plotnikov, Evie Lacks, MD as PCP -  General Troy Sine, MD as PCP - Cardiology (Cardiology) Tyler Pita, MD (Radiation Oncology) Ladell Pier, MD as Attending Physician (Hematology and Oncology) [name], MD (Gastroenterology) Irine Seal, MD as Attending Physician (Urology)  Indicate any recent Medical Services you may have received from other than [facility] providers in the past year (date may be approximate).     Assessment:   This is a routine wellness examination for Aaron [name].  Hearing/Vision screen No exam data present  Dietary issues and exercise activities discussed: Current Exercise Habits: Home exercise routine, Type of exercise: walking;Other - see comments (active around the house), Time (Minutes): 30, Frequency (Times/Week): 5, Weekly Exercise (Minutes/Week): 150, Intensity: Mild, Exercise limited by: respiratory conditions(s)  Goals Addressed             This Visit's Progress    Patient Stated       Continue to work in the garden so that I can provide fresh turnips to the community.  I love riding my tractor.       Depression Screen PHQ 2/9 Scores 08/12/2020 07/24/2019 10/24/2017 08/30/2016 01/31/2015  PHQ - 2 Score 0 0 0 0 0    Fall Risk Fall Risk  08/12/2020 07/24/2019 02/18/2019 10/24/2017 10/22/2016  Falls in the past year? 0 0 0 No No  Comment - - Emmi Telephone Survey: data to providers prior to load - Emmi Telephone Survey: data to providers prior to load  Number falls in past yr: 0 0 - - -  Injury with Fall? 0 0 - - -  Risk for fall due to : No Fall Risks No Fall Risks - - -  Follow up Falls evaluation completed Falls evaluation completed;Education provided;Falls prevention discussed - - -    FALL RISK PREVENTION PERTAINING TO THE HOME:  Any stairs in or around the home? Yes  If so, are there any without handrails? No  Home free of loose throw rugs in walkways, pet beds, electrical cords, etc? Yes  Adequate lighting in your home to reduce risk of falls? Yes   ASSISTIVE  DEVICES UTILIZED TO PREVENT FALLS:  Life alert? No  Use of a cane, walker or w/c? No  Grab bars in the bathroom? Yes  Shower chair or bench in shower? Yes  Elevated toilet seat or a handicapped toilet? Yes   TIMED UP AND GO:  Was the test performed? No .  Length of time to ambulate 10 feet: 0 sec.   Gait steady and fast without use of assistive device  Cognitive Function: Normal cognitive status assessed by direct observation by this Nurse Health Advisor. No abnormalities found.       6CIT Screen 07/24/2019  What Year? 0 points  What month? 0 points  What time? 0 points  Count back from 20 0 points  Months in reverse 0 points  Repeat phrase 2 points  Total Score 2    Immunizations Immunization History  Administered Date(s) Administered   Influenza Split 12/27/2010, 12/13/2011   Influenza Whole 02/04/2004, 12/25/2007, 01/04/2010   Influenza, High Dose Seasonal PF 01/01/2015, 12/29/2015, 11/29/2016, 01/07/2018, 11/16/2018   Influenza, Seasonal, Injecte, Preservative Fre 12/19/2012   Influenza-Unspecified 12/23/2013   PFIZER(Purple Top)SARS-COV-2 Vaccination 04/16/2019, 05/07/2019, 01/06/2020, 07/29/2020   Pneumococcal Conjugate-13 01/22/2013   Pneumococcal Polysaccharide-23 02/09/2004, 03/04/2008, 09/01/2015   Td 08/26/2006   Tdap 03/29/2016   Zoster Recombinat (Shingrix) 05/13/2020    TDAP status: Up to date  Flu Vaccine status: Up to date  Pneumococcal vaccine status: Up to date  Covid-19 vaccine status: Completed vaccines  Qualifies for Shingles Vaccine? Yes   Zostavax completed No   Shingrix Completed?: Yes  Screening Tests Health Maintenance  Topic Date Due   INFLUENZA VACCINE  10/24/2020   TETANUS/TDAP  03/29/2026   COVID-19 Vaccine  Completed   PNA vac Low Risk Adult  Completed   HPV VACCINES  Aged Out    Health Maintenance  There are no preventive care reminders to display for this patient.  Colorectal cancer screening: No longer required.    Lung Cancer Screening: (Low Dose CT Chest recommended if Age 73-80 years, 30 pack-year currently smoking OR have quit w/in 15years.) does not qualify.   Lung Cancer Screening Referral: no  Additional Screening:  Hepatitis C Screening: does not qualify; Completed no  Vision Screening: Recommended annual ophthalmology exams for early detection of glaucoma and other disorders of the eye. Is the patient up to date with their annual eye exam?  Yes  Who is the provider or what is the name of the office in which the patient attends annual eye exams? Wynell Balloon, MD. If pt is not established with a provider, would they like to be referred to a provider to establish care? No .   Dental Screening: Recommended annual dental exams for proper oral hygiene  Community Resource Referral / Chronic Care Management: CRR required this visit?  No   CCM required this visit?  No      Plan:     I have personally reviewed and noted the following in the patient's chart:   Medical and social history Use of alcohol, tobacco or illicit drugs  Current medications and supplements including opioid prescriptions. Patient is not currently taking opioid prescriptions. Functional ability and status Nutritional status Physical activity Advanced directives List of other physicians Hospitalizations, surgeries, and ER visits in previous 12 months Vitals Screenings to include cognitive, depression, and falls Referrals and appointments  In addition, I have reviewed and discussed with patient certain preventive  protocols, quality metrics, and best practice recommendations. A written personalized care plan for preventive services as well as general preventive health recommendations were provided to patient.     Sheral Flow, LPN   [id number]   Nurse Notes: n/a  Medical screening examination/treatment/procedure(s) were performed by non-physician practitioner and as supervising physician I was  immediately available for consultation/collaboration.  I agree with above. Lew Dawes, MD

## 2020-08-18 ENCOUNTER — Other Ambulatory Visit: Payer: Self-pay | Admitting: Internal Medicine

## 2020-09-21 DIAGNOSIS — R972 Elevated prostate specific antigen [PSA]: Secondary | ICD-10-CM | POA: Diagnosis not present

## 2020-09-21 DIAGNOSIS — C61 Malignant neoplasm of prostate: Secondary | ICD-10-CM | POA: Diagnosis not present

## 2020-09-23 ENCOUNTER — Other Ambulatory Visit (HOSPITAL_COMMUNITY): Payer: Self-pay | Admitting: Urology

## 2020-09-23 DIAGNOSIS — C61 Malignant neoplasm of prostate: Secondary | ICD-10-CM

## 2020-09-24 ENCOUNTER — Other Ambulatory Visit: Payer: Self-pay | Admitting: Internal Medicine

## 2020-09-27 MED ORDER — SIMVASTATIN 20 MG PO TABS: ORAL_TABLET | ORAL | 0 refills | Status: DC

## 2020-10-09 ENCOUNTER — Other Ambulatory Visit: Payer: Self-pay | Admitting: Internal Medicine

## 2020-10-14 ENCOUNTER — Other Ambulatory Visit: Payer: Self-pay

## 2020-10-14 ENCOUNTER — Ambulatory Visit (HOSPITAL_COMMUNITY)
Admission: RE | Admit: 2020-10-14 | Discharge: 2020-10-14 | Disposition: A | Discharge status: Home / Self Care | Payer: Medicare Other | Source: Ambulatory Visit | Attending: Urology | Admitting: Urology

## 2020-10-14 DIAGNOSIS — C61 Malignant neoplasm of prostate: Secondary | ICD-10-CM

## 2020-10-14 MED ORDER — PIFLIFOLASTAT F 18 (PYLARIFY) INJECTION
9.0000 | Freq: Once | INTRAVENOUS | Status: AC
  Administered 2020-10-14: 9.5 via INTRAVENOUS

## 2020-10-14 MED ORDER — FLUDEOXYGLUCOSE F - 18 (FDG) INJECTION: 9.5000 | Freq: Once | INTRAVENOUS | Status: DC

## 2020-11-01 DIAGNOSIS — H40022 Open angle with borderline findings, high risk, left eye: Secondary | ICD-10-CM | POA: Diagnosis not present

## 2020-11-01 DIAGNOSIS — H401211 Low-tension glaucoma, right eye, mild stage: Secondary | ICD-10-CM | POA: Diagnosis not present

## 2020-11-01 DIAGNOSIS — H25013 Cortical age-related cataract, bilateral: Secondary | ICD-10-CM | POA: Diagnosis not present

## 2020-11-01 DIAGNOSIS — H2513 Age-related nuclear cataract, bilateral: Secondary | ICD-10-CM | POA: Diagnosis not present

## 2020-11-04 ENCOUNTER — Other Ambulatory Visit: Payer: Self-pay | Admitting: Cardiovascular Disease

## 2020-11-04 DIAGNOSIS — R3912 Poor urinary stream: Secondary | ICD-10-CM | POA: Diagnosis not present

## 2020-11-04 DIAGNOSIS — C778 Secondary and unspecified malignant neoplasm of lymph nodes of multiple regions: Secondary | ICD-10-CM | POA: Diagnosis not present

## 2020-11-04 DIAGNOSIS — C61 Malignant neoplasm of prostate: Secondary | ICD-10-CM | POA: Diagnosis not present

## 2020-11-04 DIAGNOSIS — R972 Elevated prostate specific antigen [PSA]: Secondary | ICD-10-CM | POA: Diagnosis not present

## 2020-11-10 NOTE — Progress Notes (Signed)
GU Location of Tumor / Histology: [place]  If Prostate Cancer, Gleason Score is (5 + 4), PSA (13.5), and Prostate volume (66m)  Aaron [name]  months ago with signs/symptoms of:   Biopsies revealed:       Past/Anticipated interventions by urology, if any:   Past/Anticipated interventions by medical oncology, if any:   Weight changes, if any: none  IPSS Score: 15 SHIM Score:5  Bowel/Bladder complaints, if any: no  Nausea/Vomiting, if any: none  Pain issues, if any:  no  SAFETY ISSUES: Prior radiation? Throat cancer 2010 Pacemaker/ICD? no Possible current pregnancy? no Is the patient on methotrexate? no  Current Complaints / other details:   Vitals:   11/15/20 0931  BP: 111/72  Pulse: 95  Resp: 18  Temp: 98 F (36.7 C)  SpO2: 98%  Weight: 92.1 kg

## 2020-11-14 ENCOUNTER — Telehealth: Payer: Self-pay | Admitting: *Deleted

## 2020-11-14 NOTE — Telephone Encounter (Signed)
-----   Message from [place] sent at 11/11/2020  4:25 PM EDT ----- For review

## 2020-11-15 ENCOUNTER — Encounter: Payer: Self-pay | Admitting: Radiation Oncology

## 2020-11-15 ENCOUNTER — Ambulatory Visit
Admission: RE | Admit: 2020-11-15 | Discharge: 2020-11-15 | Disposition: A | Discharge status: Home / Self Care | Payer: Medicare Other | Source: Ambulatory Visit | Attending: Radiation Oncology | Admitting: Radiation Oncology

## 2020-11-15 ENCOUNTER — Other Ambulatory Visit: Payer: Self-pay

## 2020-11-15 VITALS — BP 111/72 | HR 95 | Temp 98.0°F | Resp 18 | Wt 203.1 lb

## 2020-11-15 DIAGNOSIS — C778 Secondary and unspecified malignant neoplasm of lymph nodes of multiple regions: Secondary | ICD-10-CM | POA: Diagnosis not present

## 2020-11-15 DIAGNOSIS — K219 Gastro-esophageal reflux disease without esophagitis: Secondary | ICD-10-CM | POA: Diagnosis not present

## 2020-11-15 DIAGNOSIS — Z9221 Personal history of antineoplastic chemotherapy: Secondary | ICD-10-CM | POA: Insufficient documentation

## 2020-11-15 DIAGNOSIS — E785 Hyperlipidemia, unspecified: Secondary | ICD-10-CM | POA: Insufficient documentation

## 2020-11-15 DIAGNOSIS — Z923 Personal history of irradiation: Secondary | ICD-10-CM | POA: Insufficient documentation

## 2020-11-15 DIAGNOSIS — C154 Malignant neoplasm of middle third of esophagus: Secondary | ICD-10-CM

## 2020-11-15 DIAGNOSIS — Z7982 Long term (current) use of aspirin: Secondary | ICD-10-CM | POA: Diagnosis not present

## 2020-11-15 DIAGNOSIS — I2510 Atherosclerotic heart disease of native coronary artery without angina pectoris: Secondary | ICD-10-CM | POA: Diagnosis not present

## 2020-11-15 DIAGNOSIS — Z8501 Personal history of malignant neoplasm of esophagus: Secondary | ICD-10-CM | POA: Diagnosis not present

## 2020-11-15 DIAGNOSIS — J449 Chronic obstructive pulmonary disease, unspecified: Secondary | ICD-10-CM | POA: Diagnosis not present

## 2020-11-15 DIAGNOSIS — C61 Malignant neoplasm of prostate: Secondary | ICD-10-CM | POA: Insufficient documentation

## 2020-11-15 DIAGNOSIS — Z8711 Personal history of peptic ulcer disease: Secondary | ICD-10-CM | POA: Insufficient documentation

## 2020-11-15 DIAGNOSIS — K449 Diaphragmatic hernia without obstruction or gangrene: Secondary | ICD-10-CM | POA: Diagnosis not present

## 2020-11-15 DIAGNOSIS — R972 Elevated prostate specific antigen [PSA]: Secondary | ICD-10-CM | POA: Diagnosis not present

## 2020-11-15 DIAGNOSIS — E039 Hypothyroidism, unspecified: Secondary | ICD-10-CM | POA: Insufficient documentation

## 2020-11-15 NOTE — Progress Notes (Signed)
Radiation Oncology         [phone] ________________________________  Initial Outpatient Consultation  Name: Aaron [name] MRN: [id number]  Date: 11/15/2020  DOB: November 05, 1935  [id number], Evie Lacks, MD  Irine Seal, MD   REFERRING PHYSICIAN: Irine Seal, MD  DIAGNOSIS: 85 y.o. 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    ICD-10-CM   1. Malignant neoplasm of prostate ([place])  C61       HISTORY OF PRESENT ILLNESS: Aaron [name] is a 85 y.o. male with a diagnosis of prostate cancer. He has been followed by [name] for BPH with an elevated PSA since at least 2019. Despite his PSA being well over 10, he declined prostate biopsy multiple times over the years. He was started on finasteride in the fall of 2020. His PSA decreased to 6.78 in 08/2019 but subsequently began to rise again. When it reached 13.5 (27 adjusted for finasteride), he agreed to proceed with prostate biopsy.  The patient underwent transrectal ultrasound with 12 biopsies of the prostate on 09/21/20.  The prostate volume measured 35 cc.  Out of 13 core biopsies, 11 were positive.  The maximum Gleason score was 5+4, and this was seen in a core from the SV junction (with perineural invasion), left mid, right mid (with PNI), right base lateral, right mid lateral (with PNI), and right apex lateral. Additionally, Gleason 4+5 was seen in the right apex (with PNI), right base (with PNI), left base, and left base lateral. Gleason 3+3 was seen in the left apex.  He underwent PSMA scan on 10/14/20 for disease staging and this showed signs of extensive extranodal disease in the abdomen and pelvis and diffuse cancer in the region of the prostate likely extending into bilateral seminal vesicles and potentially into the vas deferens (L>R) but no signs of osseous or solid visceral involvement.  The patient reviewed the biopsy results with his  urologist and he has kindly been referred today for discussion of potential radiation treatment options.  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  PREVIOUS RADIATION THERAPY: Yes  05/03/09-06/16/09: Chemoradiation to the thoracic esophagus under the care of Dr. Particia Lather  PAST MEDICAL HISTORY:  Past Medical History:  Diagnosis Date   Allergic rhinitis    Allergy    Anxiety    Arrhythmia    CAD (coronary artery disease)    mild   COPD (chronic obstructive pulmonary disease) (HCC)    Diverticulosis of colon    Elevated glucose    Esophageal cancer ([place]) 2011   squamous cell ca - had J-tube for some time, now removed    Esophageal stricture    Gastritis    GERD (gastroesophageal reflux disease)    Hemorrhoids    Hiatal hernia    History of chemotherapy 05/03/09-06/16/09   Taxol/carboplatin   History of nuclear stress test 07/2008   exercise; mild-mod perfusion dfect in basal inf/mid inf/apical inf regions; EKG negative for ischemia   History of radiation therapy 05/03/09-06/16/09   squamous cell ca of esophagus   Hyperlipidemia    [name]   Hypothyroid    s/p radiotherapy      PAST SURGICAL HISTORY: Past Surgical History:  Procedure Laterality Date   CARDIAC CATHETERIZATION  08/2008   mild nonobstructive CAD with  30% LAD stenosis prox & 20% OM1 narrowing    COLONOSCOPY     CREATION OF CUTANEOUS STOMA  2011   ESOPHAGOGASTRODUODENOSCOPY  10/03/2011   Procedure: ESOPHAGOGASTRODUODENOSCOPY (EGD);  Surgeon: [name], MD,FACG;  Location: [name];  Service: Endoscopy;  Laterality: N/A;  no fluro needed   Lower Back Cyst Removed     [facility]   POLYPECTOMY     [name] DILATION  10/03/2011   Procedure: [name] DILATION;  Surgeon: [name], MD,FACG;  Location: [facility];  Service: Endoscopy;  Laterality: N/A;    TONSILLECTOMY     age 66   TRANSTHORACIC ECHOCARDIOGRAM  06/2009   EF=>55%; trace MR/TR   UPPER GASTROINTESTINAL ENDOSCOPY      FAMILY HISTORY:  Family History  Problem Relation Age of Onset   Prostate cancer Father        50's   Diabetes Father    Stroke Mother        aneurism   Stroke Maternal Grandfather 51   Prostate cancer Brother 54   Colon polyps Brother    Prostate cancer Brother 6   Colon cancer Sister    Coronary artery disease Neg Hx    Hypertension Neg Hx    Stomach cancer Neg Hx    Rectal cancer Neg Hx     SOCIAL HISTORY:  Social History   Socioeconomic History   Marital status: Married    Spouse name: Not on file   Number of children: 3   Years of education: 12th   Highest education level: Not on file  Occupational History   Occupation: Retired, [place] [employer]    Employer: RETIRED    Comment: Supervisor  Tobacco Use   Smoking status: Former    Packs/day: 1.00    Years: 40.00    Pack years: 40.00    Types: Cigarettes    Quit date: 01/16/2001    Years since quitting: 19.8   Smokeless tobacco: Never   Tobacco comments:    Quit 2002  Vaping Use   Vaping Use: Never used  Substance and Sexual Activity   Alcohol use: Yes    Alcohol/week: 0.0 standard drinks    Comment: occasional drink 2-3 a month   Drug use: No    Comment: quit smoking 2002   Sexual activity: Yes  Other Topics Concern   Not on file  Social History Narrative   Daily Caffeine - 1      Social Determinants of Health   Financial Resource Strain: Low Risk    Difficulty of Paying Living Expenses: Not hard at all  Food Insecurity: No Food Insecurity   Worried About Charity fundraiser in the Last Year: Never true   Ran Out of Food in the Last Year: Never true  Transportation Needs: No Transportation Needs   Lack of Transportation (Medical): No   Lack of Transportation (Non-Medical): No  Physical Activity: Sufficiently Active   Days of Exercise per Week: 5 days    Minutes of Exercise per Session: 30 min  Stress: No Stress Concern Present   Feeling of Stress : Not at all  Social Connections: Socially Integrated   Frequency of Communication with Friends and Family: More than three times a week   Frequency of Social Gatherings with Friends and Family: More than three times a week   Attends Religious Services: More than 4 times per year   Active Member of [employer] or Organizations: No   Attends Archivist  Meetings: More than 4 times per year   Marital Status: Married  Human resources officer Violence: Not At Risk   Fear of Current or Ex-Partner: No   Emotionally Abused: No   Physically Abused: No   Sexually Abused: No    ALLERGIES: Amoxicillin, Cefuroxime axetil, Fluconazole, and Escitalopram  MEDICATIONS:  Current Outpatient Medications  Medication Sig Dispense Refill   aspirin 81 MG EC tablet Take 81 mg by mouth daily.     CHOLECALCIFEROL PO Take 600 Units by mouth daily.     clonazePAM (KLONOPIN) 1 MG tablet Take 0.5-1 tablets (0.5-1 mg total) by mouth 2 (two) times daily as needed. for anxiety 180 tablet 1   Dextromethorphan-guaiFENesin 20-400 MG TABS Take 1 tablet by mouth daily as needed.      levothyroxine (SYNTHROID) 125 MCG tablet TAKE 1 TABLET BY MOUTH  DAILY 90 tablet 0   metoprolol succinate (TOPROL-XL) 25 MG 24 hr tablet TAKE 1/2 OF A TABLET BY  MOUTH DAILY 45 tablet 3   omeprazole (PRILOSEC) 40 MG capsule Take 1 capsule (40 mg total) by mouth daily. 90 capsule 3   simvastatin (ZOCOR) 20 MG tablet TAKE 1 TABLET BY MOUTH  DAILY Annual appt is due w/labs must see provider for future refills . 90 tablet 0   clotrimazole-betamethasone (LOTRISONE) cream Apply topically 2 (two) times daily. (Patient not taking: Reported on 11/15/2020) 90 g 1   finasteride (PROSCAR) 5 MG tablet TAKE 1 TABLET BY MOUTH  DAILY (Patient not taking: Reported on 11/15/2020) 90 tablet 3   No current facility-administered medications for this encounter.    REVIEW  OF SYSTEMS:  On review of systems, the patient reports that he is doing well overall. He denies any chest pain, shortness of breath, cough, fevers, chills, night sweats, unintended weight changes. He denies any bowel disturbances, and denies abdominal pain, nausea or vomiting. He denies any new musculoskeletal or joint aches or pains. His IPSS was 15, indicating moderate urinary symptoms. His SHIM was 5, indicating he has severe erectile dysfunction. A complete review of systems is obtained and is otherwise negative.    PHYSICAL EXAM:  Wt Readings from Last 3 Encounters:  11/15/20 203 lb 2 oz (92.1 kg)  08/12/20 206 lb 12.8 oz (93.8 kg)  06/15/20 206 lb 9.6 oz (93.7 kg)   Temp Readings from Last 3 Encounters:  11/15/20 98 F (36.7 C)  08/12/20 98.2 F (36.8 C)  04/29/20 97.8 F (36.6 C) (Tympanic)   BP Readings from Last 3 Encounters:  11/15/20 111/72  08/12/20 120/70  06/15/20 111/67   Pulse Readings from Last 3 Encounters:  11/15/20 95  08/12/20 84  06/15/20 79   Pain Assessment Pain Score: 0-No pain/10  In general this is a well appearing African American male in no acute distress. He's alert and oriented x4 and appropriate throughout the examination. Cardiopulmonary assessment is negative for acute distress, and he exhibits normal effort.     KPS = 100  100 - Normal; no complaints; no evidence of disease. 90   - Able to carry on normal activity; minor signs or symptoms of disease. 80   - Normal activity with effort; some signs or symptoms of disease. 38   - Cares for self; unable to carry on normal activity or to do active work. 60   - Requires occasional assistance, but is able to care for most of his personal needs. 50   - Requires considerable assistance and frequent medical care. 40   -  Disabled; requires special care and assistance. 12   - Severely disabled; hospital admission is indicated although death not imminent. 26   - Very sick; hospital admission necessary;  active supportive treatment necessary. 10   - Moribund; fatal processes progressing rapidly. 0     - Dead  Karnofsky DA, Abelmann [place], Craver LS and Burchenal [facility] [phone]) The use of the nitrogen mustards in the palliative treatment of carcinoma: with particular reference to bronchogenic carcinoma Cancer 1 634-56  LABORATORY DATA:  Lab Results  Component Value Date   WBC 4.7 10/08/2019   HGB 14.2 10/08/2019   HCT 43.4 10/08/2019   MCV 89.7 10/08/2019   PLT 167 10/08/2019   Lab Results  Component Value Date   NA 135 04/20/2020   K 4.0 04/20/2020   CL 99 04/20/2020   CO2 30 04/20/2020   Lab Results  Component Value Date   ALT 13 04/20/2020   AST 25 04/20/2020   ALKPHOS 68 04/20/2020   BILITOT 0.5 04/20/2020     RADIOGRAPHY: No results found.    IMPRESSION/PLAN: 1. 85 y.o. 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  We discussed the patient's workup and outlined the nature of prostate cancer in this setting. The patient's T stage, Gleason's score, and PSA put him into the high risk group. Accordingly, he is eligible for a variety of potential treatment options including LT-ADT in combination with 8 weeks of external radiation or ADT alone. We discussed the available radiation techniques, and focused on the details and logistics of delivery. We discussed and outlined the risks, benefits, short and long-term effects associated with radiotherapy and compared and contrasted these with a more palliative approach to care with ADT alone. We discussed the role of SpaceOAR gel in reducing the rectal toxicity associated with radiotherapy. We also detailed the role of ADT in the treatment of high risk prostate cancer and outlined the associated side effects that could be expected with this therapy.  He was encouraged to ask questions that were answered to his stated satisfaction.  At the conclusion of our  conversation, the patient is interested in moving forward with 8 weeks of external beam therapy in combination with LT-ADT. He has already started ADT with oral Orgovyx on 11/04/20. We will share our discussion with [name] and make arrangements for fiducial markers and SpaceOAR gel placement in early October 2022, prior to simulation, to reduce rectal toxicity from radiotherapy. The patient appears to have a good understanding of his disease and our treatment recommendations which are of curative intent and is in agreement with the stated plan.  Therefore, we will move forward with treatment planning accordingly, in anticipation of beginning IMRT approximately 2 months after starting ADT.  We personally spent 75 minutes in this encounter including chart review, reviewing radiological studies, meeting face-to-face with the patient, entering orders and completing documentation.   Nicholos Johns, PA-C    Tyler Pita, MD  [facility] Direct Dial: [phone]  Fax: [phone] .com  Skype  LinkedIn   This document serves as a record of services personally performed by Tyler Pita, MD and Freeman Caldron, PA-C. It was created on their behalf by Wilburn Mylar, a trained medical scribe. The creation of this record is based on the scribe's personal observations and the provider's statements to them. This document has been checked and approved by the attending provider.

## 2020-11-16 ENCOUNTER — Telehealth: Payer: Self-pay | Admitting: *Deleted

## 2020-11-16 NOTE — Telephone Encounter (Signed)
Called patient to inform of fid. markers and space oar to be placed on 12-29-20 @ [facility] and his sim on 01-03-21- arrival time- 9:15 am @ [facility], spoke with patient and he is aware of these appts.

## 2020-11-17 NOTE — Addendum Note (Signed)
Encounter addended by: Tyler Pita, MD on: 11/17/2020 1:17 PM  Actions taken: Medication List reviewed, Problem List reviewed, Allergies reviewed, Visit diagnoses modified

## 2020-11-21 ENCOUNTER — Ambulatory Visit: Payer: Medicare Other | Admitting: Cardiovascular Disease

## 2020-11-30 DIAGNOSIS — C61 Malignant neoplasm of prostate: Secondary | ICD-10-CM | POA: Diagnosis not present

## 2020-12-06 ENCOUNTER — Other Ambulatory Visit: Payer: Self-pay | Admitting: Internal Medicine

## 2020-12-07 DIAGNOSIS — C61 Malignant neoplasm of prostate: Secondary | ICD-10-CM | POA: Diagnosis not present

## 2020-12-07 DIAGNOSIS — C778 Secondary and unspecified malignant neoplasm of lymph nodes of multiple regions: Secondary | ICD-10-CM | POA: Diagnosis not present

## 2020-12-08 MED ORDER — CLONAZEPAM 1 MG PO TABS: 0.5000 mg | ORAL_TABLET | Freq: Two times a day (BID) | ORAL | 1 refills | Status: DC | PRN

## 2020-12-12 ENCOUNTER — Ambulatory Visit: Payer: Medicare Other | Admitting: Internal Medicine

## 2020-12-13 ENCOUNTER — Other Ambulatory Visit: Payer: Self-pay

## 2020-12-14 ENCOUNTER — Ambulatory Visit (INDEPENDENT_AMBULATORY_CARE_PROVIDER_SITE_OTHER): Payer: Medicare Other | Admitting: Internal Medicine

## 2020-12-14 ENCOUNTER — Encounter: Payer: Self-pay | Admitting: Internal Medicine

## 2020-12-14 VITALS — BP 102/60 | HR 88 | Temp 98.2°F | Ht 75.0 in | Wt 204.0 lb

## 2020-12-14 DIAGNOSIS — R739 Hyperglycemia, unspecified: Secondary | ICD-10-CM | POA: Diagnosis not present

## 2020-12-14 DIAGNOSIS — R269 Unspecified abnormalities of gait and mobility: Secondary | ICD-10-CM | POA: Diagnosis not present

## 2020-12-14 DIAGNOSIS — H6123 Impacted cerumen, bilateral: Secondary | ICD-10-CM | POA: Diagnosis not present

## 2020-12-14 DIAGNOSIS — Z23 Encounter for immunization: Secondary | ICD-10-CM

## 2020-12-14 DIAGNOSIS — E039 Hypothyroidism, unspecified: Secondary | ICD-10-CM | POA: Diagnosis not present

## 2020-12-14 DIAGNOSIS — R972 Elevated prostate specific antigen [PSA]: Secondary | ICD-10-CM | POA: Diagnosis not present

## 2020-12-14 DIAGNOSIS — I2510 Atherosclerotic heart disease of native coronary artery without angina pectoris: Secondary | ICD-10-CM

## 2020-12-14 DIAGNOSIS — E785 Hyperlipidemia, unspecified: Secondary | ICD-10-CM

## 2020-12-14 DIAGNOSIS — C155 Malignant neoplasm of lower third of esophagus: Secondary | ICD-10-CM

## 2020-12-14 DIAGNOSIS — N183 Chronic kidney disease, stage 3 unspecified: Secondary | ICD-10-CM | POA: Diagnosis not present

## 2020-12-14 DIAGNOSIS — C61 Malignant neoplasm of prostate: Secondary | ICD-10-CM

## 2020-12-14 LAB — COMPREHENSIVE METABOLIC PANEL
ALT: 12 U/L (ref 0–53)
AST: 24 U/L (ref 0–37)
Albumin: 4.4 g/dL (ref 3.5–5.2)
Alkaline Phosphatase: 81 U/L (ref 39–117)
BUN: 22 mg/dL (ref 6–23)
CO2: 29 mEq/L (ref 19–32)
Calcium: 10.2 mg/dL (ref 8.4–10.5)
Chloride: 100 mEq/L (ref 96–112)
Creatinine, Ser: 1.55 mg/dL — ABNORMAL HIGH (ref 0.40–1.50)
GFR: 41.29 mL/min — ABNORMAL LOW (ref >60.00)
Glucose, Bld: 98 mg/dL (ref 70–99)
Potassium: 3.8 mEq/L (ref 3.5–5.1)
Sodium: 135 mEq/L (ref 135–145)
Total Bilirubin: 0.4 mg/dL (ref 0.2–1.2)
Total Protein: 8.5 g/dL — ABNORMAL HIGH (ref 6.0–8.3)

## 2020-12-14 LAB — HEMOGLOBIN A1C: Hgb A1c MFr Bld: 7.1 % — ABNORMAL HIGH (ref 4.6–6.5)

## 2020-12-14 LAB — TSH: TSH: 1.42 u[IU]/mL (ref 0.35–5.50)

## 2020-12-14 MED ORDER — ORGOVYX 120 MG PO TABS
ORAL_TABLET | ORAL | 3 refills | Status: DC
Start: 2020-12-14 — End: 2023-07-29

## 2020-12-14 NOTE — Assessment & Plan Note (Signed)
Hydrate well 

## 2020-12-14 NOTE — Addendum Note (Signed)
Addended by: Cassandria Anger on: 12/14/2020 01:54 PM   Modules accepted: Orders

## 2020-12-14 NOTE — Assessment & Plan Note (Signed)
Cont on Simvastatin Monitor labs - lipids, LFTs

## 2020-12-14 NOTE — Assessment & Plan Note (Signed)
Check A1c. 

## 2020-12-14 NOTE — Progress Notes (Signed)
Subjective:  Patient ID: Aaron [name], male    DOB: 1935/09/14  Age: 85 y.o. MRN: [id number]  CC: Follow-up   HPI Aaron [name] presents for CRI, hypothyroidism, CAD, HTN f/u C/o age related unsteadiness; no falls  Outpatient Medications Prior to Visit  Medication Sig Dispense Refill   aspirin 81 MG EC tablet Take 81 mg by mouth daily.     CHOLECALCIFEROL PO Take 600 Units by mouth daily.     clonazePAM (KLONOPIN) 1 MG tablet Take 0.5-1 tablets (0.5-1 mg total) by mouth 2 (two) times daily as needed. for anxiety 180 tablet 1   Dextromethorphan-guaiFENesin 20-400 MG TABS Take 1 tablet by mouth daily as needed.      finasteride (PROSCAR) 5 MG tablet TAKE 1 TABLET BY MOUTH  DAILY 90 tablet 3   levothyroxine (SYNTHROID) 125 MCG tablet TAKE 1 TABLET BY MOUTH  DAILY 90 tablet 1   metoprolol succinate (TOPROL-XL) 25 MG 24 hr tablet TAKE 1/2 OF A TABLET BY  MOUTH DAILY 45 tablet 3   omeprazole (PRILOSEC) 40 MG capsule Take 1 capsule (40 mg total) by mouth daily. 90 capsule 3   simvastatin (ZOCOR) 20 MG tablet TAKE 1 TABLET BY MOUTH  DAILY 90 tablet 1   clotrimazole-betamethasone (LOTRISONE) cream Apply topically 2 (two) times daily. (Patient not taking: No sig reported) 90 g 1   No facility-administered medications prior to visit.    ROS: Review of Systems  Constitutional:  Negative for appetite change, fatigue and unexpected weight change.  HENT:  Negative for congestion, nosebleeds, sneezing, sore throat and trouble swallowing.   Eyes:  Negative for itching and visual disturbance.  Respiratory:  Negative for cough.   Cardiovascular:  Negative for chest pain, palpitations and leg swelling.  Gastrointestinal:  Negative for abdominal distention, blood in stool, diarrhea and nausea.  Genitourinary:  Negative for frequency and hematuria.  Musculoskeletal:  Positive for arthralgias and gait problem. Negative for back pain, joint swelling and neck pain.  Skin:  Negative for color change  and rash.  Neurological:  Negative for dizziness, tremors, speech difficulty and weakness.  Psychiatric/Behavioral:  Negative for agitation, dysphoric mood and sleep disturbance. The patient is not nervous/anxious.    Objective:  BP 102/60 (BP Location: Left Arm)   Pulse 88   Temp 98.2 F (36.8 C) (Oral)   Ht 6\' 3"  (1.905 m)   Wt 204 lb (92.5 kg)   SpO2 96%   BMI 25.50 kg/m   BP Readings from Last 3 Encounters:  12/14/20 102/60  11/15/20 111/72  08/12/20 120/70    Wt Readings from Last 3 Encounters:  12/14/20 204 lb (92.5 kg)  11/15/20 203 lb 2 oz (92.1 kg)  08/12/20 206 lb 12.8 oz (93.8 kg)    Physical Exam Constitutional:      General: He is not in acute distress.    Appearance: He is well-developed.     Comments: NAD  Eyes:     Conjunctiva/sclera: Conjunctivae normal.     Pupils: Pupils are equal, round, and reactive to light.  Neck:     Thyroid: No thyromegaly.     Vascular: No JVD.  Cardiovascular:     Rate and Rhythm: Normal rate and regular rhythm.     Heart sounds: Normal heart sounds. No murmur heard.   No friction rub. No gallop.  Pulmonary:     Effort: Pulmonary effort is normal. No respiratory distress.     Breath sounds: Normal breath sounds. No wheezing or rales.  Chest:     Chest wall: No tenderness.  Abdominal:     General: Bowel sounds are normal. There is no distension.     Palpations: Abdomen is soft. There is no mass.     Tenderness: There is no abdominal tenderness. There is no guarding or rebound.  Musculoskeletal:        General: No tenderness. Normal range of motion.     Cervical back: Normal range of motion.  Lymphadenopathy:     Cervical: No cervical adenopathy.  Skin:    General: Skin is warm and dry.     Findings: No rash.  Neurological:     Mental Status: He is alert and oriented to person, place, and time.     Cranial Nerves: No cranial nerve deficit.     Motor: No abnormal muscle tone.     Coordination: Coordination  abnormal.     Gait: Gait abnormal.     Deep Tendon Reflexes: Reflexes are normal and symmetric.  Psychiatric:        Behavior: Behavior normal.        Thought Content: Thought content normal.        Judgment: Judgment normal.  Age related unstreadiness  Lab Results  Component Value Date   WBC 4.7 10/08/2019   HGB 14.2 10/08/2019   HCT 43.4 10/08/2019   PLT 167 10/08/2019   GLUCOSE 107 (H) 04/20/2020   CHOL 180 10/08/2019   TRIG 140 10/08/2019   HDL 64 10/08/2019   LDLCALC 92 10/08/2019   ALT 13 04/20/2020   AST 25 04/20/2020   NA 135 04/20/2020   K 4.0 04/20/2020   CL 99 04/20/2020   CREATININE 1.54 (H) 04/20/2020   BUN 14 04/20/2020   CO2 30 04/20/2020   TSH 1.09 04/20/2020   PSA 10.87 (H) 04/20/2020   INR 1.19 04/28/2009   HGBA1C 6.6 (H) 04/20/2020    No results found.  Assessment & Plan:   Problem List Items Addressed This Visit   None     Follow-up: No follow-ups on file.  Walker Kehr, MD

## 2020-12-14 NOTE — Addendum Note (Signed)
Addended by: Earnstine Regal on: 12/14/2020 02:08 PM   Modules accepted: Orders

## 2020-12-14 NOTE — Assessment & Plan Note (Signed)
Continue on ASA, Simvastatin. No angina

## 2020-12-14 NOTE — Assessment & Plan Note (Signed)
Removed w/a loop B manually

## 2020-12-14 NOTE — Assessment & Plan Note (Signed)
On Levothroid Check TSH

## 2020-12-14 NOTE — Assessment & Plan Note (Signed)
Mild age related unsteadiness; no falls. Pt refused using aids

## 2020-12-14 NOTE — Assessment & Plan Note (Addendum)
F/u w/Dr Junious Silk PSA On Orgovyx 120 mg/d

## 2020-12-14 NOTE — Assessment & Plan Note (Signed)
F/u w/Dr Stark 

## 2020-12-14 NOTE — Addendum Note (Signed)
Addended by: Boris Lown B on: 12/14/2020 02:02 PM   Modules accepted: Orders

## 2020-12-14 NOTE — Assessment & Plan Note (Signed)
Per Urology -  F/u w/Dr Junious Silk On Orgovyx 120 mg/d

## 2020-12-15 ENCOUNTER — Telehealth: Payer: Self-pay | Admitting: Cardiovascular Disease

## 2020-12-15 NOTE — Telephone Encounter (Signed)
Spoke to patient he stated he saw PCP yesterday and his B/P was low 102/60.Stated PCP did not change medications.Stated he decided to start taking Metoprolol Succ 25 mg 1/2 tablet daily this morning.He did not check B/P this morning.Stated he will have wife check when she gets back home.Advised systolic B/P should be over 100.Appointment scheduled with Coletta Memos NP 10/7 at 11:15 am.Advised to monitor B/P daily and bring readings to appointment.I will make Dr.Kelly aware.

## 2020-12-15 NOTE — Telephone Encounter (Signed)
Pt c/o medication issue:  1. Name of Medication: metoprolol succinate (TOPROL-XL) 25 MG 24 hr tablet  2. How are you currently taking this medication (dosage and times per day)? TAKE 1/2 OF A TABLET BY MOUTH DAILY  3. Are you having a reaction (difficulty breathing--STAT)? no  4. What is your medication issue? pt is calling to inform Dr. [name] that he is now only taking a half of his bp pill metoprolol succinate (TOPROL-XL) 25 MG 24 hr tablet, instead of a half then quarter of a pill due to his bp getting low

## 2020-12-27 NOTE — Progress Notes (Signed)
Cardiology Clinic Note   Patient Name: Aaron [name] Date of Encounter: 12/27/2020  Primary Care Provider:  Cassandria Anger, MD Primary Cardiologist:  Shelva Majestic, MD  Patient Profile    Aaron [name] 85 year old male presents to the clinic today for an evaluation of his low blood pressure.  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 time, now removed    Esophageal stricture    Gastritis    GERD (gastroesophageal reflux disease)    Hemorrhoids    Hiatal hernia    History of chemotherapy 05/03/09-06/16/09   Taxol/carboplatin   History of nuclear stress test 07/2008   exercise; mild-mod perfusion dfect in basal inf/mid inf/apical inf regions; EKG negative for ischemia   History of radiation therapy 05/03/09-06/16/09   squamous cell ca of esophagus   Hyperlipidemia    [name]   Hypothyroid    s/p radiotherapy   Past Surgical History:  Procedure Laterality Date   CARDIAC CATHETERIZATION  08/2008   mild nonobstructive CAD with 30% LAD stenosis prox & 20% OM1 narrowing    COLONOSCOPY     CREATION OF CUTANEOUS STOMA  2011   ESOPHAGOGASTRODUODENOSCOPY  10/03/2011   Procedure: ESOPHAGOGASTRODUODENOSCOPY (EGD);  Surgeon: [name], MD,FACG;  Location: [name];  Service: Endoscopy;  Laterality: N/A;  no fluro needed   Lower Back Cyst Removed     [facility]   POLYPECTOMY     [name] DILATION  10/03/2011   Procedure: [name] DILATION;  Surgeon: [name], MD,FACG;  Location: [facility];  Service: Endoscopy;  Laterality: N/A;   TONSILLECTOMY     age 20   TRANSTHORACIC ECHOCARDIOGRAM  06/2009   EF=>55%; trace MR/TR   UPPER GASTROINTESTINAL ENDOSCOPY       Allergies  Allergies  Allergen Reactions   Amoxicillin     REACTION: rash   Cefuroxime Axetil     REACTION: nit feeling well   Fluconazole     REACTION: shaking   Escitalopram Other (See Comments)    Nightmares, numbness in tongue, dizziness    History of Present Illness    Aaron [name] has a PMH of esophageal neoplasm, HTN, arrhythmia, coronary artery disease, COPD, acute bronchitis, GERD, dysphagia, hypothyroidism, chronic renal insufficiency stage III, dyslipidemia, anxiety, and gait disturbance.  He was seen by Almyra Deforest, PA-C on 9/21.  During that time he had some mild complaints of nonexertional chest pain.  He was referred for nuclear stress test which showed low risk 10/21.  The test suggested small prior infarct versus prominent diaphragmatic attenuation.  No ischemia was noted and his EF was 52%.  He followed up with Dr. [name] 06/15/2020.  During that time he remained stable from a cardiac standpoint.  He denied recurrent chest discomfort.  He did note some occasional mild dizziness.  He denied syncope.  His blood pressure was well controlled in the 229 systolic range.  He continued on his metoprolol succinate.  He denied palpitations.  He reported compliance with his simvastatin and levothyroxine.  His EKG showed sinus rhythm 81 bpm with 1 PVC and normal intervals.  It was  recommended that he start metoprolol 12.5 mg every other day with 6.25 mg on alternating days (quarter pill).  Follow-up was planned for 6 months.  He contacted nurse triage line on 12/15/2020 and reported he was having episodes of low blood pressure.  He presented to his PCP.  His PCP did not change any of his medications.  During the call he reported he had reduced his metoprolol succinate to 12.5 mg daily.  His wife was checking his blood pressure.  And he was instructed to monitor for systolic blood pressure of 100 and 2 hold medication if systolic blood pressure was less than 100.  He presents to the  clinic today for follow-up evaluation states he feels well.  He has been monitoring his blood pressure at home which showed blood pressures in the  100s over 60s.  He would then present to the fire station to have his blood pressure rechecked.  His time he presented to the fire station his blood pressure was in the 120s over 70s.  It appears that his blood pressure cuff at home is breathing artificially low by 15-20 points.  We will have him obtain a new blood pressure cuff and maintain a blood pressure log.  I will continue his current medication regimen with 12-1/2 mg of metoprolol on odd days and 6.25 mg of metoprolol in the evening days.  I will have him maintain his level of physical activity and continue his heart healthy low-sodium diet.  I will give him a blood pressure log and have him contact the office with blood pressures less than 196 systolic.  We will plan follow-up for 3 to 4 months with Dr. [name].  Today he denies chest pain, shortness of breath, lower extremity edema, fatigue, palpitations, melena, hematuria, hemoptysis, diaphoresis, weakness, presyncope, syncope, orthopnea, and PND.   Home Medications    Prior to Admission medications   Medication Sig Start Date End Date Taking? Authorizing Provider  aspirin 81 MG EC tablet Take 81 mg by mouth daily.    [provider]  CHOLECALCIFEROL PO Take 600 Units by mouth daily.    [provider]  clonazePAM (KLONOPIN) 1 MG tablet Take 0.5-1 tablets (0.5-1 mg total) by mouth 2 (two) times daily as needed. for anxiety 12/08/20   Plotnikov, Evie Lacks, MD  Dextromethorphan-guaiFENesin 20-400 MG TABS Take 1 tablet by mouth daily as needed.     [provider]  finasteride (PROSCAR) 5 MG tablet TAKE 1 TABLET BY MOUTH  DAILY 08/19/20   Plotnikov, Evie Lacks, MD  levothyroxine (SYNTHROID) 125 MCG tablet TAKE 1 TABLET BY MOUTH  DAILY 12/07/20   Plotnikov, Evie Lacks, MD  metoprolol succinate (TOPROL-XL) 25 MG 24 hr tablet TAKE  1/2 OF A TABLET BY  MOUTH DAILY 11/07/20   Troy Sine, MD  omeprazole (PRILOSEC) 40 MG capsule Take 1 capsule (40 mg total) by mouth daily. 04/27/20   [name], MD  Relugolix (ORGOVYX) 120 MG TABS 1 po qd 12/14/20   Plotnikov, Evie Lacks, MD  simvastatin (ZOCOR) 20 MG tablet TAKE 1 TABLET BY MOUTH  DAILY 12/07/20   Plotnikov, Evie Lacks, MD    Family History    Family History  Problem Relation Age of Onset   Prostate cancer Father        66's   Diabetes Father    Stroke Mother        aneurism   Stroke Maternal Grandfather 63   Prostate cancer Brother 38  Colon polyps Brother    Prostate cancer Brother 33   Colon cancer Sister    Coronary artery disease Neg Hx    Hypertension Neg Hx    Stomach cancer Neg Hx    Rectal cancer Neg Hx    He indicated that his mother is deceased. He indicated that his father is deceased. He indicated that the status of his sister is unknown. He indicated that the status of his maternal grandfather is unknown. He indicated that the status of his neg hx is unknown.  Social History    Social History   Socioeconomic History   Marital status: Married    Spouse name: Not on file   Number of children: 3   Years of education: 12th   Highest education level: Not on file  Occupational History   Occupation: Retired, [place] [employer]    Employer: RETIRED    Comment: Supervisor  Tobacco Use   Smoking status: Former    Packs/day: 1.00    Years: 40.00    Pack years: 40.00    Types: Cigarettes    Quit date: 01/16/2001    Years since quitting: 19.9   Smokeless tobacco: Never   Tobacco comments:    Quit 2002  Vaping Use   Vaping Use: Never used  Substance and Sexual Activity   Alcohol use: Yes    Alcohol/week: 0.0 standard drinks    Comment: occasional drink 2-3 a month   Drug use: No    Comment: quit smoking 2002   Sexual activity: Yes  Other Topics Concern   Not on file  Social History Narrative   Daily Caffeine - 1       Social Determinants of Health   Financial Resource Strain: Low Risk    Difficulty of Paying Living Expenses: Not hard at all  Food Insecurity: No Food Insecurity   Worried About Charity fundraiser in the Last Year: Never true   Ran Out of Food in the Last Year: Never true  Transportation Needs: No Transportation Needs   Lack of Transportation (Medical): No   Lack of Transportation (Non-Medical): No  Physical Activity: Sufficiently Active   Days of Exercise per Week: 5 days   Minutes of Exercise per Session: 30 min  Stress: No Stress Concern Present   Feeling of Stress : Not at all  Social Connections: Socially Integrated   Frequency of Communication with Friends and Family: More than three times a week   Frequency of Social Gatherings with Friends and Family: More than three times a week   Attends Religious Services: More than 4 times per year   Active Member of [employer] or Organizations: No   Attends Music therapist: More than 4 times per year   Marital Status: Married  Human resources officer Violence: Not At Risk   Fear of Current or Ex-Partner: No   Emotionally Abused: No   Physically Abused: No   Sexually Abused: No     Review of Systems    General:  No chills, fever, night sweats or weight changes.  Cardiovascular:  No chest pain, dyspnea on exertion, edema, orthopnea, palpitations, paroxysmal nocturnal dyspnea. Dermatological: No rash, lesions/masses Respiratory: No cough, dyspnea Urologic: No hematuria, dysuria Abdominal:   No nausea, vomiting, diarrhea, bright red blood per rectum, melena, or hematemesis Neurologic:  No visual changes, wkns, changes in mental status. All other systems reviewed and are otherwise negative except as noted above.  Physical Exam    VS:  There were no vitals taken for this visit. , BMI There is no height or weight on file to calculate BMI. GEN: Well nourished, well developed, in no acute distress. HEENT: normal. Neck: Supple,  no JVD, carotid bruits, or masses. Cardiac: RRR, no murmurs, rubs, or gallops. No clubbing, cyanosis, edema.  Radials/DP/PT 2+ and equal bilaterally.  Respiratory:  Respirations regular and unlabored, clear to auscultation bilaterally. GI: Soft, nontender, nondistended, BS + x 4. MS: no deformity or atrophy. Skin: warm and dry, no rash. Neuro:  Strength and sensation are intact. Psych: Normal affect.  Accessory Clinical Findings    Recent Labs: 12/14/2020: ALT 12; BUN 22; Creatinine, Ser 1.55; Potassium 3.8; Sodium 135; TSH 1.42   Recent Lipid Panel    Component Value Date/Time   CHOL 180 10/08/2019 1119   CHOL 150 12/05/2017 0942   TRIG 140 10/08/2019 1119   TRIG 120 02/26/2006 1420   HDL 64 10/08/2019 1119   HDL 57 12/05/2017 0942   CHOLHDL 2.8 10/08/2019 1119   VLDL 26.0 10/09/2018 1032   LDLCALC 92 10/08/2019 1119    ECG personally reviewed by me today-none today.  Assessment & Plan   1.  Essential hypertension-BP today 96/58.  Contacted nurse triage line on 12/15/2020 and indicated that he was having episodes of low blood pressure.  He brings in his blood pressure log today which shows blood pressures in the 1 100s over 60s.  He would present to the fire station where his blood pressure would routinely be 120s over 70s.  I have recommended that he get a new blood pressure cuff. Continue metoprolol succinate 12.5 and 6.25 mg every other day Heart healthy low-sodium diet-salty 6 given Increase physical activity as tolerated  Coronary artery disease-underwent Lexiscan Myoview 9/21 which showed low risk and no ischemia.  Had cardiac catheterization 6/10 which showed mild nonobstructive CAD. Continue aspirin, simvastatin Heart healthy low-sodium diet-salty 6 given Increase physical activity as tolerated  Hyperlipidemia-LDL 92 on 10/07/2020 Continue aspirin, simvastatin Heart healthy low-sodium high-fiber diet Increase physical activity as tolerated  Dizziness-denies recent  episodes of presyncope or syncope.  Denies recent episodes of lightheadedness. Maintain p.o. hydration Lower extremity support stockings  Stage III CKD-creatinine 1.55 on 12/14/2020 Follows with PCP  Disposition: Follow-up with Dr. [name] in 3-4 months.   Jossie Ng. Lauralye Kinn NP-C    12/27/2020, 9:13 AM [place] [address] Office [phone] Fax [phone]  Notice: This dictation was prepared with Dragon dictation along with smaller phrase technology. Any transcriptional errors that result from this process are unintentional and may not be corrected upon review.  I spent 14 minutes examining this patient, reviewing medications, and using patient centered shared decision making involving her cardiac care.  Prior to her visit I spent greater than 20 minutes reviewing her past medical history,  medications, and prior cardiac tests.

## 2020-12-27 NOTE — Telephone Encounter (Signed)
Continue with scheduled appointment for this Friday

## 2020-12-29 DIAGNOSIS — C61 Malignant neoplasm of prostate: Secondary | ICD-10-CM | POA: Diagnosis not present

## 2020-12-30 ENCOUNTER — Ambulatory Visit (INDEPENDENT_AMBULATORY_CARE_PROVIDER_SITE_OTHER): Payer: Medicare Other | Admitting: General Practice

## 2020-12-30 ENCOUNTER — Other Ambulatory Visit: Payer: Self-pay

## 2020-12-30 ENCOUNTER — Encounter: Payer: Self-pay | Admitting: General Practice

## 2020-12-30 VITALS — BP 100/60 | HR 86 | Ht 75.0 in | Wt 204.0 lb

## 2020-12-30 DIAGNOSIS — E785 Hyperlipidemia, unspecified: Secondary | ICD-10-CM

## 2020-12-30 DIAGNOSIS — N1831 Chronic kidney disease, stage 3a: Secondary | ICD-10-CM

## 2020-12-30 DIAGNOSIS — I10 Essential (primary) hypertension: Secondary | ICD-10-CM

## 2020-12-30 DIAGNOSIS — R42 Dizziness and giddiness: Secondary | ICD-10-CM | POA: Diagnosis not present

## 2020-12-30 DIAGNOSIS — I2510 Atherosclerotic heart disease of native coronary artery without angina pectoris: Secondary | ICD-10-CM

## 2020-12-30 NOTE — Patient Instructions (Signed)
Medication Instructions:  The current medical regimen is effective;  continue present plan and medications as directed. Please refer to the Current Medication list given to you today.   *If you need a refill on your cardiac medications before your next appointment, please call your pharmacy*  Lab Work:   Testing/Procedures:  NONE    NONE  Special Instructions TAKE AND LOG YOUR BLOOD PRESSURE AND CALL us IF THE TOP NUMBER IS <100  PURCHASE A NEW BLOOD PRESSURE CUFF AND GO TO FIRE STATION AND HAVE THEM TAKE YOUR BLOOD PRESSURE TO MAKE SURE YOUR NEW CUFF IS ACCURATE.  PLEASE MAINTAIN PHYSICAL ACTIVITY AS TOLERATED  Follow-Up: Your next appointment:  3-4 month(s) In Person with Shelva Majestic, MD OR IF UNAVAILABLE [place], FNP-C  At [facility], you and your health needs are our priority.  As part of our continuing mission to provide you with exceptional heart care, we have created designated Provider Care Teams.  These Care Teams include your primary Cardiologist (physician) and Advanced Practice Providers (APPs -  Physician Assistants and Nurse Practitioners) who all work together to provide you with the care you need, when you need it.  We recommend signing up for the patient portal called "MyChart".  Sign up information is provided on this After Visit Summary.  MyChart is used to connect with patients for Virtual Visits (Telemedicine).  Patients are able to view lab/test results, encounter notes, upcoming appointments, etc.  Non-urgent messages can be sent to your provider as well.   To learn more about what you can do with MyChart, go to [email or web address].

## 2021-01-02 ENCOUNTER — Telehealth: Payer: Self-pay | Admitting: *Deleted

## 2021-01-02 NOTE — Telephone Encounter (Signed)
CALLED PATIENT TO REMIND OF SIM APPT. FOR 01-03-21- ARRIVAL TIME- 9:15 AM @ [facility], SPOKE WITH PATIENT AND HE IS AWARE OF THIS APPT.

## 2021-01-03 ENCOUNTER — Other Ambulatory Visit: Payer: Self-pay

## 2021-01-03 ENCOUNTER — Ambulatory Visit
Admission: RE | Admit: 2021-01-03 | Discharge: 2021-01-03 | Disposition: A | Discharge status: Home / Self Care | Payer: Medicare Other | Source: Ambulatory Visit | Attending: Radiation Oncology | Admitting: Radiation Oncology

## 2021-01-03 DIAGNOSIS — Z510 Encounter for antineoplastic radiation therapy: Secondary | ICD-10-CM | POA: Insufficient documentation

## 2021-01-03 DIAGNOSIS — C61 Malignant neoplasm of prostate: Secondary | ICD-10-CM | POA: Insufficient documentation

## 2021-01-03 NOTE — Progress Notes (Signed)
  Radiation Oncology         [phone]) [phone] ________________________________  Name: Aaron [name] MRN: [id number]  Date: 01/03/2021  DOB: 18-Jan-1936  SIMULATION AND TREATMENT PLANNING NOTE  No diagnosis found.  DIAGNOSIS:  85 y.o. 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   NARRATIVE:  The patient was brought to the [facility].  Identity was confirmed.  All relevant records and images related to the planned course of therapy were reviewed.  The patient freely provided informed written consent to proceed with treatment after reviewing the details related to the planned course of therapy. The consent form was witnessed and verified by the simulation staff.  Then, the patient was set-up in a stable reproducible supine position for radiation therapy.  A vacuum lock pillow device was custom fabricated to position his legs in a reproducible immobilized position.  Then, I performed a urethrogram under sterile conditions to identify the prostatic bed.  CT images were obtained.  Surface markings were placed.  The CT images were loaded into the planning software.  Then the prostate bed target, pelvic lymph node target and avoidance structures including the rectum, bladder, bowel and hips were contoured.  Treatment planning then occurred.  The radiation prescription was entered and confirmed.  A total of one complex treatment devices were fabricated. I have requested : Intensity Modulated Radiotherapy (IMRT) is medically necessary for this case for the following reason:  Rectal sparing.Marland Kitchen  PLAN:  The patient will receive 45 Gy in 25 fractions of 1.8 Gy to the pelvis and paraaortic nodal basin, followed by a boost to the prostate and PET-positive nodes to a total dose of 75 Gy with 15 additional fractions of 2 Gy.   ________________________________  Sheral Apley Tammi Klippel, M.D.

## 2021-01-10 ENCOUNTER — Encounter: Payer: Self-pay | Admitting: Radiation Oncology

## 2021-01-10 NOTE — Progress Notes (Signed)
  Radiation Oncology         [phone] ________________________________  Name: Aaron [name] MRN: [id number]  Date: 01/10/2021  DOB: [birth year]  VIRTUAL SIMULATION NOTE  NARRATIVE:  The patient underwent simulation today for ongoing radiation therapy.  The existing CT study set was employed for the purpose of virtual treatment planning.  The target and avoidance structures were reviewed and in some cases modified.  Treatment planning then occurred.  The radiation boost prescription was entered and confirmed.   I have requested : Isodose Plan.   PLAN:  This modified radiation beam arrangement is intended to continue the current radiation dose to an additional 30 Gy in 15 fractions for a total cumulative dose of 2 Gy to prostate and PET-positive nodes  ------------------------------------------------  Sheral Apley. Tammi Klippel, M.D.

## 2021-01-11 DIAGNOSIS — Z510 Encounter for antineoplastic radiation therapy: Secondary | ICD-10-CM | POA: Diagnosis not present

## 2021-01-11 DIAGNOSIS — C61 Malignant neoplasm of prostate: Secondary | ICD-10-CM | POA: Diagnosis not present

## 2021-01-12 ENCOUNTER — Ambulatory Visit
Admission: RE | Admit: 2021-01-12 | Discharge: 2021-01-12 | Disposition: A | Discharge status: Home / Self Care | Payer: Medicare Other | Source: Ambulatory Visit | Attending: Radiation Oncology | Admitting: Radiation Oncology

## 2021-01-12 ENCOUNTER — Other Ambulatory Visit: Payer: Self-pay

## 2021-01-12 DIAGNOSIS — Z510 Encounter for antineoplastic radiation therapy: Secondary | ICD-10-CM | POA: Diagnosis not present

## 2021-01-12 DIAGNOSIS — C61 Malignant neoplasm of prostate: Secondary | ICD-10-CM | POA: Diagnosis not present

## 2021-01-13 ENCOUNTER — Ambulatory Visit
Admission: RE | Admit: 2021-01-13 | Discharge: 2021-01-13 | Disposition: A | Discharge status: Home / Self Care | Payer: Medicare Other | Source: Ambulatory Visit | Attending: Radiation Oncology | Admitting: Radiation Oncology

## 2021-01-13 DIAGNOSIS — Z510 Encounter for antineoplastic radiation therapy: Secondary | ICD-10-CM | POA: Diagnosis not present

## 2021-01-13 DIAGNOSIS — C61 Malignant neoplasm of prostate: Secondary | ICD-10-CM | POA: Diagnosis not present

## 2021-01-16 ENCOUNTER — Other Ambulatory Visit: Payer: Self-pay

## 2021-01-16 ENCOUNTER — Ambulatory Visit
Admission: RE | Admit: 2021-01-16 | Discharge: 2021-01-16 | Disposition: A | Discharge status: Home / Self Care | Payer: Medicare Other | Source: Ambulatory Visit | Attending: Radiation Oncology | Admitting: Radiation Oncology

## 2021-01-16 DIAGNOSIS — C61 Malignant neoplasm of prostate: Secondary | ICD-10-CM | POA: Diagnosis not present

## 2021-01-16 DIAGNOSIS — Z510 Encounter for antineoplastic radiation therapy: Secondary | ICD-10-CM | POA: Diagnosis not present

## 2021-01-17 ENCOUNTER — Ambulatory Visit
Admission: RE | Admit: 2021-01-17 | Discharge: 2021-01-17 | Disposition: A | Discharge status: Home / Self Care | Payer: Medicare Other | Source: Ambulatory Visit | Attending: Radiation Oncology | Admitting: Radiation Oncology

## 2021-01-17 DIAGNOSIS — Z510 Encounter for antineoplastic radiation therapy: Secondary | ICD-10-CM | POA: Diagnosis not present

## 2021-01-17 DIAGNOSIS — C61 Malignant neoplasm of prostate: Secondary | ICD-10-CM | POA: Diagnosis not present

## 2021-01-18 ENCOUNTER — Other Ambulatory Visit: Payer: Self-pay

## 2021-01-18 ENCOUNTER — Ambulatory Visit
Admission: RE | Admit: 2021-01-18 | Discharge: 2021-01-18 | Disposition: A | Discharge status: Home / Self Care | Payer: Medicare Other | Source: Ambulatory Visit | Attending: Radiation Oncology | Admitting: Radiation Oncology

## 2021-01-18 DIAGNOSIS — C61 Malignant neoplasm of prostate: Secondary | ICD-10-CM | POA: Diagnosis not present

## 2021-01-18 DIAGNOSIS — Z510 Encounter for antineoplastic radiation therapy: Secondary | ICD-10-CM | POA: Diagnosis not present

## 2021-01-19 ENCOUNTER — Ambulatory Visit
Admission: RE | Admit: 2021-01-19 | Discharge: 2021-01-19 | Disposition: A | Discharge status: Home / Self Care | Payer: Medicare Other | Source: Ambulatory Visit | Attending: Radiation Oncology | Admitting: Radiation Oncology

## 2021-01-19 DIAGNOSIS — Z510 Encounter for antineoplastic radiation therapy: Secondary | ICD-10-CM | POA: Diagnosis not present

## 2021-01-19 DIAGNOSIS — C61 Malignant neoplasm of prostate: Secondary | ICD-10-CM | POA: Diagnosis not present

## 2021-01-20 ENCOUNTER — Ambulatory Visit
Admission: RE | Admit: 2021-01-20 | Discharge: 2021-01-20 | Disposition: A | Discharge status: Home / Self Care | Payer: Medicare Other | Source: Ambulatory Visit | Attending: Radiation Oncology | Admitting: Radiation Oncology

## 2021-01-20 ENCOUNTER — Other Ambulatory Visit: Payer: Self-pay

## 2021-01-20 DIAGNOSIS — Z510 Encounter for antineoplastic radiation therapy: Secondary | ICD-10-CM | POA: Diagnosis not present

## 2021-01-20 DIAGNOSIS — C61 Malignant neoplasm of prostate: Secondary | ICD-10-CM | POA: Diagnosis not present

## 2021-01-23 ENCOUNTER — Other Ambulatory Visit: Payer: Self-pay

## 2021-01-23 ENCOUNTER — Ambulatory Visit
Admission: RE | Admit: 2021-01-23 | Discharge: 2021-01-23 | Disposition: A | Discharge status: Home / Self Care | Payer: Medicare Other | Source: Ambulatory Visit | Attending: Radiation Oncology | Admitting: Radiation Oncology

## 2021-01-23 DIAGNOSIS — Z510 Encounter for antineoplastic radiation therapy: Secondary | ICD-10-CM | POA: Diagnosis not present

## 2021-01-23 DIAGNOSIS — C61 Malignant neoplasm of prostate: Secondary | ICD-10-CM | POA: Diagnosis not present

## 2021-01-24 ENCOUNTER — Ambulatory Visit
Admission: RE | Admit: 2021-01-24 | Discharge: 2021-01-24 | Disposition: A | Discharge status: Home / Self Care | Payer: Medicare Other | Source: Ambulatory Visit | Attending: Radiation Oncology | Admitting: Radiation Oncology

## 2021-01-24 DIAGNOSIS — C61 Malignant neoplasm of prostate: Secondary | ICD-10-CM | POA: Insufficient documentation

## 2021-01-24 DIAGNOSIS — Z510 Encounter for antineoplastic radiation therapy: Secondary | ICD-10-CM | POA: Diagnosis not present

## 2021-01-24 DIAGNOSIS — C778 Secondary and unspecified malignant neoplasm of lymph nodes of multiple regions: Secondary | ICD-10-CM | POA: Diagnosis not present

## 2021-01-25 ENCOUNTER — Ambulatory Visit
Admission: RE | Admit: 2021-01-25 | Discharge: 2021-01-25 | Disposition: A | Discharge status: Home / Self Care | Payer: Medicare Other | Source: Ambulatory Visit | Attending: Radiation Oncology | Admitting: Radiation Oncology

## 2021-01-25 ENCOUNTER — Other Ambulatory Visit: Payer: Self-pay

## 2021-01-25 DIAGNOSIS — C61 Malignant neoplasm of prostate: Secondary | ICD-10-CM | POA: Diagnosis not present

## 2021-01-25 DIAGNOSIS — Z510 Encounter for antineoplastic radiation therapy: Secondary | ICD-10-CM | POA: Diagnosis not present

## 2021-01-26 ENCOUNTER — Ambulatory Visit
Admission: RE | Admit: 2021-01-26 | Discharge: 2021-01-26 | Disposition: A | Discharge status: Home / Self Care | Payer: Medicare Other | Source: Ambulatory Visit | Attending: Radiation Oncology | Admitting: Radiation Oncology

## 2021-01-26 DIAGNOSIS — Z510 Encounter for antineoplastic radiation therapy: Secondary | ICD-10-CM | POA: Diagnosis not present

## 2021-01-26 DIAGNOSIS — C61 Malignant neoplasm of prostate: Secondary | ICD-10-CM | POA: Diagnosis not present

## 2021-01-27 ENCOUNTER — Ambulatory Visit
Admission: RE | Admit: 2021-01-27 | Discharge: 2021-01-27 | Disposition: A | Discharge status: Home / Self Care | Payer: Medicare Other | Source: Ambulatory Visit | Attending: Radiation Oncology | Admitting: Radiation Oncology

## 2021-01-27 ENCOUNTER — Other Ambulatory Visit: Payer: Self-pay

## 2021-01-27 DIAGNOSIS — C61 Malignant neoplasm of prostate: Secondary | ICD-10-CM | POA: Diagnosis not present

## 2021-01-27 DIAGNOSIS — Z510 Encounter for antineoplastic radiation therapy: Secondary | ICD-10-CM | POA: Diagnosis not present

## 2021-01-30 ENCOUNTER — Ambulatory Visit
Admission: RE | Admit: 2021-01-30 | Discharge: 2021-01-30 | Disposition: A | Discharge status: Home / Self Care | Payer: Medicare Other | Source: Ambulatory Visit | Attending: Radiation Oncology | Admitting: Radiation Oncology

## 2021-01-30 ENCOUNTER — Other Ambulatory Visit: Payer: Self-pay

## 2021-01-30 DIAGNOSIS — Z510 Encounter for antineoplastic radiation therapy: Secondary | ICD-10-CM | POA: Diagnosis not present

## 2021-01-30 DIAGNOSIS — C61 Malignant neoplasm of prostate: Secondary | ICD-10-CM | POA: Diagnosis not present

## 2021-01-31 ENCOUNTER — Ambulatory Visit
Admission: RE | Admit: 2021-01-31 | Discharge: 2021-01-31 | Disposition: A | Discharge status: Home / Self Care | Payer: Medicare Other | Source: Ambulatory Visit | Attending: Radiation Oncology | Admitting: Radiation Oncology

## 2021-01-31 ENCOUNTER — Telehealth: Payer: Self-pay | Admitting: Cardiovascular Disease

## 2021-01-31 DIAGNOSIS — Z510 Encounter for antineoplastic radiation therapy: Secondary | ICD-10-CM | POA: Diagnosis not present

## 2021-01-31 DIAGNOSIS — C61 Malignant neoplasm of prostate: Secondary | ICD-10-CM | POA: Diagnosis not present

## 2021-01-31 NOTE — Telephone Encounter (Signed)
Called pt to ask for a blood pressure log to pass on to determine if he can come off of the medication. No answer at this tim, unable to leave a message. No voicemail set up.

## 2021-01-31 NOTE — Telephone Encounter (Signed)
Pt c/o medication issue:  1. Name of Medication: metoprolol succinate (TOPROL-XL) 25 MG 24 hr tablet  2. How are you currently taking this medication (dosage and times per day)? Half a tablet every other day, and a quarter on the other days  3. Are you having a reaction (difficulty breathing--STAT)? no  4. What is your medication issue? Patient states he used to have high BP and was prescribed the medication 1 tablet daily. He says a year ago it was taken down to half a tablet daily. Then 6-8 months ago it was taken down to how he now takes it, half a tablet every other day and a quarter on the other days. He says since his BP has been ranging 101-90, he would like to know if he can go off the medication.

## 2021-02-01 ENCOUNTER — Other Ambulatory Visit: Payer: Self-pay

## 2021-02-01 ENCOUNTER — Ambulatory Visit
Admission: RE | Admit: 2021-02-01 | Discharge: 2021-02-01 | Disposition: A | Discharge status: Home / Self Care | Payer: Medicare Other | Source: Ambulatory Visit | Attending: Radiation Oncology | Admitting: Radiation Oncology

## 2021-02-01 DIAGNOSIS — C61 Malignant neoplasm of prostate: Secondary | ICD-10-CM | POA: Diagnosis not present

## 2021-02-01 DIAGNOSIS — Z510 Encounter for antineoplastic radiation therapy: Secondary | ICD-10-CM | POA: Diagnosis not present

## 2021-02-02 ENCOUNTER — Ambulatory Visit
Admission: RE | Admit: 2021-02-02 | Discharge: 2021-02-02 | Disposition: A | Discharge status: Home / Self Care | Payer: Medicare Other | Source: Ambulatory Visit | Attending: Radiation Oncology | Admitting: Radiation Oncology

## 2021-02-02 DIAGNOSIS — Z510 Encounter for antineoplastic radiation therapy: Secondary | ICD-10-CM | POA: Diagnosis not present

## 2021-02-02 DIAGNOSIS — C61 Malignant neoplasm of prostate: Secondary | ICD-10-CM | POA: Diagnosis not present

## 2021-02-03 ENCOUNTER — Other Ambulatory Visit: Payer: Self-pay

## 2021-02-03 ENCOUNTER — Ambulatory Visit
Admission: RE | Admit: 2021-02-03 | Discharge: 2021-02-03 | Disposition: A | Discharge status: Home / Self Care | Payer: Medicare Other | Source: Ambulatory Visit | Attending: Radiation Oncology | Admitting: Radiation Oncology

## 2021-02-03 DIAGNOSIS — C61 Malignant neoplasm of prostate: Secondary | ICD-10-CM | POA: Diagnosis not present

## 2021-02-03 DIAGNOSIS — Z510 Encounter for antineoplastic radiation therapy: Secondary | ICD-10-CM | POA: Diagnosis not present

## 2021-02-03 NOTE — Telephone Encounter (Signed)
Spoke with pt, he reports they do take his blood pressure every Friday at the cancer center and they tell him it is good. He is wanting to know if he should just stop the metoprolol. He does report that he does get a couple skipped beats during the week. Explained to patient that the metoprolol also helps with the palpitations. He does report he can get a little dizzy when standing from sitting. He has never checked his blood pressure while standing but will do so in the future and let us know if it is getting really low. He is aware of his follow up appointment in January.

## 2021-02-06 ENCOUNTER — Other Ambulatory Visit: Payer: Self-pay

## 2021-02-06 ENCOUNTER — Ambulatory Visit
Admission: RE | Admit: 2021-02-06 | Discharge: 2021-02-06 | Disposition: A | Discharge status: Home / Self Care | Payer: Medicare Other | Source: Ambulatory Visit | Attending: Radiation Oncology | Admitting: Radiation Oncology

## 2021-02-06 DIAGNOSIS — C61 Malignant neoplasm of prostate: Secondary | ICD-10-CM | POA: Diagnosis not present

## 2021-02-06 DIAGNOSIS — Z510 Encounter for antineoplastic radiation therapy: Secondary | ICD-10-CM | POA: Diagnosis not present

## 2021-02-07 ENCOUNTER — Ambulatory Visit
Admission: RE | Admit: 2021-02-07 | Discharge: 2021-02-07 | Disposition: A | Discharge status: Home / Self Care | Payer: Medicare Other | Source: Ambulatory Visit | Attending: Radiation Oncology | Admitting: Radiation Oncology

## 2021-02-07 DIAGNOSIS — Z510 Encounter for antineoplastic radiation therapy: Secondary | ICD-10-CM | POA: Diagnosis not present

## 2021-02-07 DIAGNOSIS — C61 Malignant neoplasm of prostate: Secondary | ICD-10-CM | POA: Diagnosis not present

## 2021-02-08 ENCOUNTER — Other Ambulatory Visit: Payer: Self-pay

## 2021-02-08 ENCOUNTER — Ambulatory Visit
Admission: RE | Admit: 2021-02-08 | Discharge: 2021-02-08 | Disposition: A | Discharge status: Home / Self Care | Payer: Medicare Other | Source: Ambulatory Visit | Attending: Radiation Oncology | Admitting: Radiation Oncology

## 2021-02-08 DIAGNOSIS — C61 Malignant neoplasm of prostate: Secondary | ICD-10-CM | POA: Diagnosis not present

## 2021-02-08 DIAGNOSIS — Z510 Encounter for antineoplastic radiation therapy: Secondary | ICD-10-CM | POA: Diagnosis not present

## 2021-02-09 ENCOUNTER — Ambulatory Visit
Admission: RE | Admit: 2021-02-09 | Discharge: 2021-02-09 | Disposition: A | Discharge status: Home / Self Care | Payer: Medicare Other | Source: Ambulatory Visit | Attending: Radiation Oncology | Admitting: Radiation Oncology

## 2021-02-09 DIAGNOSIS — Z510 Encounter for antineoplastic radiation therapy: Secondary | ICD-10-CM | POA: Diagnosis not present

## 2021-02-09 DIAGNOSIS — C61 Malignant neoplasm of prostate: Secondary | ICD-10-CM | POA: Diagnosis not present

## 2021-02-10 ENCOUNTER — Ambulatory Visit: Payer: Medicare Other

## 2021-02-10 ENCOUNTER — Telehealth: Payer: Self-pay | Admitting: Internal Medicine

## 2021-02-10 ENCOUNTER — Telehealth: Payer: Self-pay | Admitting: Cardiovascular Disease

## 2021-02-10 NOTE — Telephone Encounter (Signed)
Patient calling in  Patient wanted to call to make sure we updated his medication list  Patient says he is now taking Erleada 60mg  - take 4 once nightly.. says it was prescribed by Dr. Irine [facility]

## 2021-02-10 NOTE — Telephone Encounter (Signed)
Confirmed with patient and will forward to [name] so he will be aware

## 2021-02-10 NOTE — Telephone Encounter (Signed)
New Message:     Patient says he have Prostate Cancer. He wanted to make sure that the medicine his taking for the Cancer is on his list. He takes Erleada 60 mg 4 at 1 time at night, and Orgovyx(Relugolix) tablet 120 mg, take 1 a     day.

## 2021-02-10 NOTE — Telephone Encounter (Signed)
Per chart med has already been updated.Marland KitchenJohny Chess

## 2021-02-12 ENCOUNTER — Ambulatory Visit: Payer: Medicare Other

## 2021-02-12 ENCOUNTER — Ambulatory Visit
Admission: RE | Admit: 2021-02-12 | Discharge: 2021-02-12 | Disposition: A | Discharge status: Home / Self Care | Payer: Medicare Other | Source: Ambulatory Visit | Attending: Radiation Oncology | Admitting: Radiation Oncology

## 2021-02-12 DIAGNOSIS — C61 Malignant neoplasm of prostate: Secondary | ICD-10-CM | POA: Diagnosis not present

## 2021-02-12 DIAGNOSIS — Z510 Encounter for antineoplastic radiation therapy: Secondary | ICD-10-CM | POA: Diagnosis not present

## 2021-02-13 ENCOUNTER — Ambulatory Visit
Admission: RE | Admit: 2021-02-13 | Discharge: 2021-02-13 | Disposition: A | Discharge status: Home / Self Care | Payer: Medicare Other | Source: Ambulatory Visit | Attending: Radiation Oncology | Admitting: Radiation Oncology

## 2021-02-13 ENCOUNTER — Other Ambulatory Visit: Payer: Self-pay

## 2021-02-13 DIAGNOSIS — Z510 Encounter for antineoplastic radiation therapy: Secondary | ICD-10-CM | POA: Diagnosis not present

## 2021-02-13 DIAGNOSIS — C61 Malignant neoplasm of prostate: Secondary | ICD-10-CM | POA: Diagnosis not present

## 2021-02-14 ENCOUNTER — Ambulatory Visit: Payer: Medicare Other

## 2021-02-15 ENCOUNTER — Ambulatory Visit: Payer: Medicare Other

## 2021-02-20 ENCOUNTER — Other Ambulatory Visit: Payer: Self-pay

## 2021-02-20 ENCOUNTER — Ambulatory Visit
Admission: RE | Admit: 2021-02-20 | Discharge: 2021-02-20 | Disposition: A | Discharge status: Home / Self Care | Payer: Medicare Other | Source: Ambulatory Visit | Attending: Radiation Oncology | Admitting: Radiation Oncology

## 2021-02-20 DIAGNOSIS — Z510 Encounter for antineoplastic radiation therapy: Secondary | ICD-10-CM | POA: Diagnosis not present

## 2021-02-20 DIAGNOSIS — C61 Malignant neoplasm of prostate: Secondary | ICD-10-CM | POA: Diagnosis not present

## 2021-02-21 ENCOUNTER — Ambulatory Visit
Admission: RE | Admit: 2021-02-21 | Discharge: 2021-02-21 | Disposition: A | Discharge status: Home / Self Care | Payer: Medicare Other | Source: Ambulatory Visit | Attending: Radiation Oncology | Admitting: Radiation Oncology

## 2021-02-21 DIAGNOSIS — Z510 Encounter for antineoplastic radiation therapy: Secondary | ICD-10-CM | POA: Diagnosis not present

## 2021-02-21 DIAGNOSIS — C61 Malignant neoplasm of prostate: Secondary | ICD-10-CM | POA: Diagnosis not present

## 2021-02-22 ENCOUNTER — Ambulatory Visit
Admission: RE | Admit: 2021-02-22 | Discharge: 2021-02-22 | Disposition: A | Discharge status: Home / Self Care | Payer: Medicare Other | Source: Ambulatory Visit | Attending: Radiation Oncology | Admitting: Radiation Oncology

## 2021-02-22 ENCOUNTER — Other Ambulatory Visit: Payer: Self-pay

## 2021-02-22 DIAGNOSIS — C61 Malignant neoplasm of prostate: Secondary | ICD-10-CM | POA: Diagnosis not present

## 2021-02-22 DIAGNOSIS — Z510 Encounter for antineoplastic radiation therapy: Secondary | ICD-10-CM | POA: Diagnosis not present

## 2021-02-23 ENCOUNTER — Ambulatory Visit
Admission: RE | Admit: 2021-02-23 | Discharge: 2021-02-23 | Disposition: A | Discharge status: Home / Self Care | Payer: Medicare Other | Source: Ambulatory Visit | Attending: Radiation Oncology | Admitting: Radiation Oncology

## 2021-02-23 DIAGNOSIS — C61 Malignant neoplasm of prostate: Secondary | ICD-10-CM | POA: Diagnosis not present

## 2021-02-23 DIAGNOSIS — Z510 Encounter for antineoplastic radiation therapy: Secondary | ICD-10-CM | POA: Diagnosis not present

## 2021-02-24 ENCOUNTER — Ambulatory Visit
Admission: RE | Admit: 2021-02-24 | Discharge: 2021-02-24 | Disposition: A | Discharge status: Home / Self Care | Payer: Medicare Other | Source: Ambulatory Visit | Attending: Radiation Oncology | Admitting: Radiation Oncology

## 2021-02-24 ENCOUNTER — Other Ambulatory Visit: Payer: Self-pay

## 2021-02-24 DIAGNOSIS — Z510 Encounter for antineoplastic radiation therapy: Secondary | ICD-10-CM | POA: Diagnosis not present

## 2021-02-24 DIAGNOSIS — C61 Malignant neoplasm of prostate: Secondary | ICD-10-CM | POA: Diagnosis not present

## 2021-02-27 ENCOUNTER — Other Ambulatory Visit: Payer: Self-pay

## 2021-02-27 ENCOUNTER — Ambulatory Visit
Admission: RE | Admit: 2021-02-27 | Discharge: 2021-02-27 | Disposition: A | Discharge status: Home / Self Care | Payer: Medicare Other | Source: Ambulatory Visit | Attending: Radiation Oncology | Admitting: Radiation Oncology

## 2021-02-27 DIAGNOSIS — C61 Malignant neoplasm of prostate: Secondary | ICD-10-CM | POA: Diagnosis not present

## 2021-02-27 DIAGNOSIS — Z510 Encounter for antineoplastic radiation therapy: Secondary | ICD-10-CM | POA: Diagnosis not present

## 2021-02-28 ENCOUNTER — Ambulatory Visit
Admission: RE | Admit: 2021-02-28 | Discharge: 2021-02-28 | Disposition: A | Discharge status: Home / Self Care | Payer: Medicare Other | Source: Ambulatory Visit | Attending: Radiation Oncology | Admitting: Radiation Oncology

## 2021-02-28 DIAGNOSIS — C61 Malignant neoplasm of prostate: Secondary | ICD-10-CM | POA: Diagnosis not present

## 2021-02-28 DIAGNOSIS — Z510 Encounter for antineoplastic radiation therapy: Secondary | ICD-10-CM | POA: Diagnosis not present

## 2021-03-01 ENCOUNTER — Ambulatory Visit
Admission: RE | Admit: 2021-03-01 | Discharge: 2021-03-01 | Disposition: A | Discharge status: Home / Self Care | Payer: Medicare Other | Source: Ambulatory Visit | Attending: Radiation Oncology | Admitting: Radiation Oncology

## 2021-03-01 ENCOUNTER — Other Ambulatory Visit: Payer: Self-pay

## 2021-03-01 DIAGNOSIS — Z510 Encounter for antineoplastic radiation therapy: Secondary | ICD-10-CM | POA: Diagnosis not present

## 2021-03-01 DIAGNOSIS — C778 Secondary and unspecified malignant neoplasm of lymph nodes of multiple regions: Secondary | ICD-10-CM | POA: Diagnosis not present

## 2021-03-01 DIAGNOSIS — C61 Malignant neoplasm of prostate: Secondary | ICD-10-CM | POA: Diagnosis not present

## 2021-03-02 ENCOUNTER — Ambulatory Visit
Admission: RE | Admit: 2021-03-02 | Discharge: 2021-03-02 | Disposition: A | Discharge status: Home / Self Care | Payer: Medicare Other | Source: Ambulatory Visit | Attending: Radiation Oncology | Admitting: Radiation Oncology

## 2021-03-02 DIAGNOSIS — Z510 Encounter for antineoplastic radiation therapy: Secondary | ICD-10-CM | POA: Diagnosis not present

## 2021-03-02 DIAGNOSIS — C61 Malignant neoplasm of prostate: Secondary | ICD-10-CM | POA: Diagnosis not present

## 2021-03-03 ENCOUNTER — Other Ambulatory Visit: Payer: Self-pay

## 2021-03-03 ENCOUNTER — Ambulatory Visit
Admission: RE | Admit: 2021-03-03 | Discharge: 2021-03-03 | Disposition: A | Discharge status: Home / Self Care | Payer: Medicare Other | Source: Ambulatory Visit | Attending: Radiation Oncology | Admitting: Radiation Oncology

## 2021-03-03 DIAGNOSIS — Z510 Encounter for antineoplastic radiation therapy: Secondary | ICD-10-CM | POA: Diagnosis not present

## 2021-03-03 DIAGNOSIS — C61 Malignant neoplasm of prostate: Secondary | ICD-10-CM | POA: Diagnosis not present

## 2021-03-06 ENCOUNTER — Other Ambulatory Visit: Payer: Self-pay

## 2021-03-06 ENCOUNTER — Ambulatory Visit
Admission: RE | Admit: 2021-03-06 | Discharge: 2021-03-06 | Disposition: A | Discharge status: Home / Self Care | Payer: Medicare Other | Source: Ambulatory Visit | Attending: Radiation Oncology | Admitting: Radiation Oncology

## 2021-03-06 DIAGNOSIS — C61 Malignant neoplasm of prostate: Secondary | ICD-10-CM | POA: Diagnosis not present

## 2021-03-06 DIAGNOSIS — Z510 Encounter for antineoplastic radiation therapy: Secondary | ICD-10-CM | POA: Diagnosis not present

## 2021-03-06 NOTE — Progress Notes (Signed)
Patient sent over from radiation treatment with concerns of skin rash to bilateral thighs, torso, and bilateral arm.  On observation skin appears to be extremely dry, patient denies itchy skin.  Notified Dr. Tammi Klippel also observed no orders at this time.  Patient advised to stay hydrated, moisturize skin, and continue to observe for changes if any notify staff.

## 2021-03-07 ENCOUNTER — Ambulatory Visit
Admission: RE | Admit: 2021-03-07 | Discharge: 2021-03-07 | Disposition: A | Discharge status: Home / Self Care | Payer: Medicare Other | Source: Ambulatory Visit | Attending: Radiation Oncology | Admitting: Radiation Oncology

## 2021-03-07 DIAGNOSIS — Z510 Encounter for antineoplastic radiation therapy: Secondary | ICD-10-CM | POA: Diagnosis not present

## 2021-03-07 DIAGNOSIS — C61 Malignant neoplasm of prostate: Secondary | ICD-10-CM | POA: Diagnosis not present

## 2021-03-08 ENCOUNTER — Ambulatory Visit
Admission: RE | Admit: 2021-03-08 | Discharge: 2021-03-08 | Disposition: A | Discharge status: Home / Self Care | Payer: Medicare Other | Source: Ambulatory Visit | Attending: Radiation Oncology | Admitting: Radiation Oncology

## 2021-03-08 ENCOUNTER — Other Ambulatory Visit: Payer: Self-pay

## 2021-03-08 DIAGNOSIS — C61 Malignant neoplasm of prostate: Secondary | ICD-10-CM | POA: Diagnosis not present

## 2021-03-08 DIAGNOSIS — Z510 Encounter for antineoplastic radiation therapy: Secondary | ICD-10-CM | POA: Diagnosis not present

## 2021-03-09 ENCOUNTER — Ambulatory Visit
Admission: RE | Admit: 2021-03-09 | Discharge: 2021-03-09 | Disposition: A | Discharge status: Home / Self Care | Payer: Medicare Other | Source: Ambulatory Visit | Attending: Radiation Oncology | Admitting: Radiation Oncology

## 2021-03-09 DIAGNOSIS — Z510 Encounter for antineoplastic radiation therapy: Secondary | ICD-10-CM | POA: Diagnosis not present

## 2021-03-09 DIAGNOSIS — C61 Malignant neoplasm of prostate: Secondary | ICD-10-CM | POA: Diagnosis not present

## 2021-03-10 ENCOUNTER — Ambulatory Visit: Payer: Medicare Other

## 2021-03-10 ENCOUNTER — Ambulatory Visit
Admission: RE | Admit: 2021-03-10 | Discharge: 2021-03-10 | Disposition: A | Discharge status: Home / Self Care | Payer: Medicare Other | Source: Ambulatory Visit | Attending: Radiation Oncology | Admitting: Radiation Oncology

## 2021-03-10 ENCOUNTER — Other Ambulatory Visit: Payer: Self-pay

## 2021-03-10 DIAGNOSIS — Z510 Encounter for antineoplastic radiation therapy: Secondary | ICD-10-CM | POA: Diagnosis not present

## 2021-03-10 DIAGNOSIS — C61 Malignant neoplasm of prostate: Secondary | ICD-10-CM | POA: Diagnosis not present

## 2021-03-13 ENCOUNTER — Ambulatory Visit: Payer: Medicare Other

## 2021-03-13 ENCOUNTER — Other Ambulatory Visit: Payer: Self-pay

## 2021-03-13 ENCOUNTER — Ambulatory Visit
Admission: RE | Admit: 2021-03-13 | Discharge: 2021-03-13 | Disposition: A | Discharge status: Home / Self Care | Payer: Medicare Other | Source: Ambulatory Visit | Attending: Radiation Oncology | Admitting: Radiation Oncology

## 2021-03-13 DIAGNOSIS — C61 Malignant neoplasm of prostate: Secondary | ICD-10-CM | POA: Diagnosis not present

## 2021-03-13 DIAGNOSIS — Z510 Encounter for antineoplastic radiation therapy: Secondary | ICD-10-CM | POA: Diagnosis not present

## 2021-03-14 ENCOUNTER — Encounter: Payer: Self-pay | Admitting: Urology

## 2021-03-14 ENCOUNTER — Ambulatory Visit
Admission: RE | Admit: 2021-03-14 | Discharge: 2021-03-14 | Disposition: A | Discharge status: Home / Self Care | Payer: Medicare Other | Source: Ambulatory Visit | Attending: Radiation Oncology | Admitting: Radiation Oncology

## 2021-03-14 ENCOUNTER — Ambulatory Visit: Payer: Medicare Other

## 2021-03-14 DIAGNOSIS — Z510 Encounter for antineoplastic radiation therapy: Secondary | ICD-10-CM | POA: Diagnosis not present

## 2021-03-14 DIAGNOSIS — C61 Malignant neoplasm of prostate: Secondary | ICD-10-CM | POA: Diagnosis not present

## 2021-03-31 ENCOUNTER — Encounter: Payer: Self-pay | Admitting: Cardiovascular Disease

## 2021-03-31 ENCOUNTER — Other Ambulatory Visit: Payer: Self-pay

## 2021-03-31 ENCOUNTER — Ambulatory Visit (INDEPENDENT_AMBULATORY_CARE_PROVIDER_SITE_OTHER): Payer: Medicare Other | Admitting: Cardiovascular Disease

## 2021-03-31 DIAGNOSIS — C61 Malignant neoplasm of prostate: Secondary | ICD-10-CM | POA: Diagnosis not present

## 2021-03-31 DIAGNOSIS — I10 Essential (primary) hypertension: Secondary | ICD-10-CM

## 2021-03-31 DIAGNOSIS — E039 Hypothyroidism, unspecified: Secondary | ICD-10-CM

## 2021-03-31 DIAGNOSIS — E785 Hyperlipidemia, unspecified: Secondary | ICD-10-CM

## 2021-03-31 DIAGNOSIS — I2510 Atherosclerotic heart disease of native coronary artery without angina pectoris: Secondary | ICD-10-CM | POA: Diagnosis not present

## 2021-03-31 DIAGNOSIS — Z8501 Personal history of malignant neoplasm of esophagus: Secondary | ICD-10-CM

## 2021-03-31 DIAGNOSIS — N1832 Chronic kidney disease, stage 3b: Secondary | ICD-10-CM | POA: Diagnosis not present

## 2021-03-31 MED ORDER — METOPROLOL SUCCINATE ER 25 MG PO TB24: ORAL_TABLET | ORAL | 4 refills | Status: DC

## 2021-03-31 NOTE — Progress Notes (Signed)
Patient ID: Aaron [name], male   DOB: [birth year], 86 y.o.   MRN: [id number]     HPI: Aaron [name] is a 86 y.o. male presents to the office today for a 10 month cardiology evaluation.  Aaron [name] has a history of mild CAD with 30% ostial LAD stenosis and 20% OM1 narrowing noted at cardiac catheterization in June 2010. Additional problems include hypertension with mild diastolic dysfunction, hyperlipidemia, and history of esophageal cancer, status post chemotherapy radiation therapy, history of hypothyroidism for which he is on thyroid replacement therapy. Over the past year, Aaron [name] has continued to do well. 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  He denies any change in exercise tolerance.  He denies chest pressure.  He denies palpitations.  He denies presyncope or syncope.  He denies edema.  He does have a history of diverticulosis and mild COPD.  He tells me he recently saw [name] who felt that he was stable with reference to his remote esophageal CA and that there was no need to do any repeat endoscopy.   He underwent a 2-D echo Doppler study in October 2016 which showed an EF of 55-60%.  Mild to moderate aortic insufficiency, mild TR and trivial PR.  I saw him June 2018 at which time he was remaining stable.  I last saw him in September 2019.  He remained active and did not have complaints and was working 4 days/week, 6 hours/day [name].He denies recurrent chest pain.  He continues to see [name] in follow-up of his esophageal cancer and has had follow-up endoscopy with [name].    I saw him in September 2020 at which time he was no longer working since the COVID-19 pandemic.  He had noticed occasional episodes of palpitations as well as a slight increase in his resting heart rate.  He sees [name] who has checked his laboratory.  He also was evaluated by [name] for his prostate.  He denies any chest pain.  He has been on simvastatin for hyperlipidemia.  He continues to be on levothyroxine 125 mcg for hypothyroidism.  He denies chest pain PND orthopnea, presyncope or syncope.  There is a history of diverticulosis and mild COPD.  During that evaluation, with his episodes of occasional increased heart rate I suggested the addition of low-dose metoprolol succinate initially at 12.5 mg to take at bedtime to be helpful to control his palpitations.  With hemoglobin A1c at 6.7 I recommended follow-up with [name] for diabetes mellitus.  I saw him in March 2021 at which time he continued to feel well.  He noted significant improvement in palpitations since the initiation of Toprol-XL 12.5 mg.  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    He was evaluated by Almyra Deforest, PA in September 2021 and had some mild complaints of nonexertional chest pain.  He subsequently was referred for a nuclear perfusion study which was low risk and done on December 29, 2019 and suggested possible small prior infarct versus prominent diaphragmatic attenuation.  There was no ischemia.  EF was 52%.  I last saw him on June 15, 2020 at which time he felt well and denied any recurrent chest pain.  At times he was experiencing very mild dizziness.  He denied  any syncope.  His blood pressure at home typically runs around 536 systolic.  He continues to be on metoprolol succinate 12.5 mg and denies any awareness of palpitations.  He is on simvastatin 20 mg for hyperlipidemia and levothyroxine 125 mcg for hypothyroidism.    Presently, he continues to feel well.  He was diagnosed with prostate cancer and has completed 40 radiation treatments, followed by [name].  He is unaware of any palpitations and has been taking metoprolol succinate 12.5 mg daily.  He is on simvastatin 20 mg daily for hyperlipidemia.   His blood pressure at home typically runs around 644-034 systolically.  He presents for evaluation.   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, now removed    Esophageal stricture    Gastritis    GERD (gastroesophageal reflux disease)    Hemorrhoids    Hiatal hernia    History of chemotherapy 05/03/09-06/16/09   Taxol/carboplatin   History of nuclear stress test 07/2008   exercise; mild-mod perfusion dfect in basal inf/mid inf/apical inf regions; EKG negative for ischemia   History of radiation therapy 05/03/09-06/16/09   squamous cell ca of esophagus   Hyperlipidemia    [name]   Hypothyroid    s/p radiotherapy    Past Surgical History:  Procedure Laterality Date   CARDIAC CATHETERIZATION  08/2008   mild nonobstructive CAD with 30% LAD stenosis prox & 20% OM1 narrowing    COLONOSCOPY     CREATION OF CUTANEOUS STOMA  2011   ESOPHAGOGASTRODUODENOSCOPY  10/03/2011   Procedure: ESOPHAGOGASTRODUODENOSCOPY (EGD);  Surgeon: [name], MD,FACG;  Location: [name];  Service: Endoscopy;  Laterality: N/A;  no fluro needed   Lower Back Cyst Removed     [facility]   POLYPECTOMY     [name] DILATION  10/03/2011   Procedure: [name] DILATION;  Surgeon: [name], MD,FACG;  Location: [facility];  Service: Endoscopy;  Laterality: N/A;   TONSILLECTOMY     age 30   TRANSTHORACIC ECHOCARDIOGRAM  06/2009   EF=>55%; trace MR/TR   UPPER GASTROINTESTINAL ENDOSCOPY      Allergies  Allergen Reactions   Amoxicillin     REACTION: rash   Cefuroxime Axetil     REACTION: nit feeling well   Fluconazole     REACTION: shaking   Escitalopram Other (See Comments)    Nightmares, numbness in tongue, dizziness     Current Outpatient Medications  Medication Sig Dispense Refill   apalutamide (ERLEADA) 60 MG tablet Take 240 mg by mouth as directed. Take 4 tablet at bedtime May be taken with or without food. Swallow tablets whole.     aspirin 81 MG EC tablet Take 81 mg by mouth daily.     CHOLECALCIFEROL PO Take 600 Units by mouth daily.     clonazePAM (KLONOPIN) 1 MG tablet Take 0.5-1 tablets (0.5-1 mg total) by mouth 2 (two) times daily as needed. for anxiety 180 tablet 1   Dextromethorphan-guaiFENesin 20-400 MG TABS Take 1 tablet by mouth daily as needed.      finasteride (PROSCAR) 5 MG tablet TAKE 1 TABLET BY MOUTH  DAILY 90  tablet 3   levothyroxine (SYNTHROID) 125 MCG tablet TAKE 1 TABLET BY MOUTH  DAILY 90 tablet 1   omeprazole (PRILOSEC) 40 MG capsule Take 1 capsule (40 mg total) by mouth daily. 90 capsule 3   Relugolix (ORGOVYX) 120 MG TABS 1 po qd 90 tablet 3   simvastatin (ZOCOR) 20 MG tablet TAKE 1 TABLET BY MOUTH  DAILY 90 tablet 1   metoprolol succinate (TOPROL-XL) 25 MG 24 hr tablet TAKE 1/2 ALTERNATING WITH 1/4 OF A TABLET BY  MOUTH DAILY 45 tablet 4   No current facility-administered medications for this visit.    Social History   Socioeconomic History   Marital status: Married    Spouse name: Not on file   Number of children: 3   Years of education: 12th   Highest education level: Not on file  Occupational History   Occupation: Retired, [place] [employer]    Employer: RETIRED    Comment: Supervisor  Tobacco Use   Smoking status: Former    Packs/day: 1.00    Years: 40.00    Pack years: 40.00    Types: Cigarettes    Quit date: 01/16/2001    Years since quitting: 20.2   Smokeless tobacco: Never   Tobacco comments:    Quit 2002  Vaping Use   Vaping Use: Never used  Substance and Sexual Activity   Alcohol use: Yes    Alcohol/week: 0.0 standard drinks    Comment: occasional drink 2-3 a month   Drug use: No    Comment: quit smoking 2002   Sexual activity: Yes   Other Topics Concern   Not on file  Social History Narrative   Daily Caffeine - 1      Social Determinants of Health   Financial Resource Strain: Low Risk    Difficulty of Paying Living Expenses: Not hard at all  Food Insecurity: No Food Insecurity   Worried About Charity fundraiser in the Last Year: Never true   Ran Out of Food in the Last Year: Never true  Transportation Needs: No Transportation Needs   Lack of Transportation (Medical): No   Lack of Transportation (Non-Medical): No  Physical Activity: Sufficiently Active   Days of Exercise per Week: 5 days   Minutes of Exercise per Session: 30 min  Stress: No Stress Concern Present   Feeling of Stress : Not at all  Social Connections: Socially Integrated   Frequency of Communication with Friends and Family: More than three times a week   Frequency of Social Gatherings with Friends and Family: More than three times a week   Attends Religious Services: More than 4 times per year   Active Member of Clubs or Organizations: No   Attends Music therapist: More than 4 times per year   Marital Status: Married  Human resources officer Violence: Not At Risk   Fear of Current or Ex-Partner: No   Emotionally Abused: No   Physically Abused: No   Sexually Abused: No    Family History  Problem Relation Age of Onset   Prostate cancer Father        100's   Diabetes Father    Stroke Mother        aneurism   Stroke Maternal Grandfather 35   Prostate cancer Brother 60   Colon polyps Brother    Prostate cancer Brother 78   Colon cancer Sister    Coronary artery disease Neg Hx    Hypertension Neg Hx  Stomach cancer Neg Hx    Rectal cancer Neg Hx     ROS General: Negative; No fevers, chills, or night sweats;  HEENT: Negative; No changes in vision or hearing, sinus congestion, difficulty swallowing Pulmonary: Negative; No cough, wheezing, shortness of breath, hemoptysis Cardiovascular: see HPI GI: Remote history of  esophageal cancer, status post chemotherapy and radiation treatment GU: Prostate CVA; recently evaluated by [name], status post 40 radiation treatments Musculoskeletal: Negative; no myalgias, joint pain, or weakness Hematologic/Oncology: Negative; no easy bruising, bleeding Endocrine: Positive for hypothyroidism on Synthroid replacement Neuro: Negative; no changes in balance, headaches Skin: Negative; No rashes or skin lesions Psychiatric: Negative; No behavioral problems, depression Sleep: Negative; No snoring, daytime sleepiness, hypersomnolence, bruxism, restless legs, hypnogognic hallucinations, no cataplexy Other comprehensive 14 point system review is negative.  PE BP (!) 110/58    Pulse 77    Ht _0  (1.905 m)    Wt 199 lb 12.8 oz (90.6 kg)    SpO2 98%    BMI 24.97 kg/m    Repeat blood pressure by me was 114/60  Wt Readings from Last 3 Encounters:  03/31/21 199 lb 12.8 oz (90.6 kg)  12/30/20 204 lb (92.5 kg)  12/14/20 204 lb (92.5 kg)   General: Alert, oriented, no distress.  Skin: normal turgor, no rashes, warm and dry HEENT: Normocephalic, atraumatic. Pupils equal round and reactive to light; sclera anicteric; extraocular muscles intact;  Nose without nasal septal hypertrophy Mouth/Parynx benign; Mallinpatti scale 3 Neck: No JVD, no carotid bruits; normal carotid upstroke Lungs: clear to ausculatation and percussion; no wheezing or rales Chest wall: without tenderness to palpitation Heart: PMI not displaced, RRR, s1 s2 normal, 1/6 systolic murmur, no diastolic murmur, no rubs, gallops, thrills, or heaves Abdomen: soft, nontender; no hepatosplenomehaly, BS+; abdominal aorta nontender and not dilated by palpation. Back: no CVA tenderness Pulses 2+ Musculoskeletal: full range of motion, normal strength, no joint deformities Extremities: no clubbing cyanosis or edema, Homan's sign negative  Neurologic: grossly nonfocal; Cranial nerves grossly wnl Psychologic: Normal mood  and affect  January 6, 2023ECG (independently read by me): NSR at 77, NST abnormality     June 15, 2020 ECG (independently read by me): Normal sinus rhythm at 79, no ectopy, normal intervals  June 15, 2020 ECG (independently read by me): NSR at 79, no ectopy; normal intervals  March 2021 ECG (independently read by me): Sinus rhythm at 81 bpm with an isolated PVC normal intervals    September 2020 ECG (independently read by me): Sinus rhythm at 95 bpm with occasional PVCs with right bundle morphology; normal intervals.  September 2019 ECG (independently read by me): Normal sinus rhythm at 76 bpm.  One isolated PVC.  No ST segment changes.  Normal intervals.  June 2019 ECG (independently read by me): Normal sinus rhythm at 79 bpm.  No ectopy.  No ST segment changes.  May 2017 ECG (independently read by me): Normal sinus rhythm at 76 bpm.  PR interval 176 ms.  QTc interval 411 milliseconds.  No ST segment changes.  September 2016 ECG (independently read by me): Normal sinus rhythm at 87 bpm.  No ectopy.  Normal intervals.  No ST segment changes.  12/17/2013 ECG (independently read by me): Normal sinus rhythm at 65 beats per minute.  Normal intervals.  No ST segment changes.  September 2014 ECG: Normal sinus rhythm at 60 beats per minute. Intervals are normal.  LABS:  BMP Latest Ref Rng & Units 12/14/2020 04/20/2020 10/08/2019  Glucose 70 - 99 mg/dL 98 107(H) 100(H)  BUN 6 - 23 mg/dL _0 Creatinine 0.40 - 1.50 mg/dL 1.55(H) 1.54(H) 1.54(H)  BUN/Creat Ratio 6 - 22 (calc) - - 11  Sodium 135 - 145 mEq/L 135 135 139  Potassium 3.5 - 5.1 mEq/L 3.8 4.0 5.0  Chloride 96 - 112 mEq/L 100 99 100  CO2 19 - 32 mEq/L _1 Calcium 8.4 - 10.5 mg/dL 10.2 10.2 10.7(H)   Hepatic Function Latest Ref Rng & Units 12/14/2020 04/20/2020 10/08/2019  Total Protein 6.0 - 8.3 g/dL 8.5(H) 8.1 8.2(H)  Albumin 3.5 - 5.2 g/dL 4.4 4.5 -  AST 0 - 37 U/L _2 ALT 0 - 53 U/L _3 Alk Phosphatase  39 - 117 U/L 81 68 -  Total Bilirubin 0.2 - 1.2 mg/dL 0.4 0.5 0.4  Bilirubin, Direct 0.0 - 0.2 mg/dL - - 0.1   CBC Latest Ref Rng & Units 10/08/2019 10/09/2018 04/04/2017  WBC 3.8 - 10.8 Thousand/uL 4.7 4.0 3.9(L)  Hemoglobin 13.2 - 17.1 g/dL 14.2 14.7 14.5  Hematocrit 38.5 - 50.0 % 43.4 44.1 44.1  Platelets 140 - 400 Thousand/uL 167 170.0 178.0   Lab Results  Component Value Date   MCV 89.7 10/08/2019   MCV 91.4 10/09/2018   MCV 90.0 04/04/2017   Lab Results  Component Value Date   TSH 1.42 12/14/2020   Lab Results  Component Value Date   HGBA1C 7.1 (H) 12/14/2020   Lipid Panel     Component Value Date/Time   CHOL 180 10/08/2019 1119   CHOL 150 12/05/2017 0942   TRIG 140 10/08/2019 1119   TRIG 120 02/26/2006 1420   HDL 64 10/08/2019 1119   HDL 57 12/05/2017 0942   CHOLHDL 2.8 10/08/2019 1119   VLDL 26.0 10/09/2018 1032   LDLCALC 92 10/08/2019 1119   IMPRESSION:  1. CAD in native artery: Cath June 2010   2. Hyperlipidemia LDL goal <70   3. Essential hypertension   4. Stage 3b chronic kidney disease ([place])   5. Hypothyroidism, unspecified type   6. Prostate CA [facility]): Status post 40 radiation treatments   7. History of esophageal cancer     ASSESSMENT AND PLAN: Aaron [name] is an active 86 year-old African-American male who has documented mild CAD by cardiac catheterization in June 2010 and has remained stable on medical therapy.  He continues to be without recurrent anginal symptomatology and remains relatively active active working in his yard.  Due to palpitations, he had been started with metoprolol succinate 12.5 mg and has continued to do exceptionally well on this therapy with resolution of prior symptomatology.  He had developed nonexertional somewhat atypical chest discomfort and was seen by Almyra Deforest, [place] in September 2021.  A Lexiscan Myoview study was low risk and did not show any ischemia.  When I last saw him in March 2022 he was experiencing rare episodes of  mild dizziness.  I recommended he monitor his blood pressure and heart rate.  I recommended that he can lower his metoprolol dose to 6.25 mg alternating with 12.5 mg every other day depending upon symptomatology.  His pulse today is 77.  Blood pressure is stable.  He completed 40 radiation treatments for his prostate CA, followed by Dr. Thurmond Butts.  He is well from a cardiovascular standpoint.  He has stage III b CKD with most recent creatinine in September 2022 at 1.55 with estimated GFR at 41.3.  He is on levothyroxine at 125 mcg for his hypothyroidism.  He continues to see [name] her primary care.  I will see him in 1 year for reevaluation or sooner as needed.    Troy Sine, MD, [facility]  04/02/2021 12:16 PM

## 2021-03-31 NOTE — Patient Instructions (Signed)
Medication Instructions:  METOPROLOL 1/2 & 1/4 TAB ALTERNATING DAILY  *If you need a refill on your cardiac medications before your next appointment, please call your pharmacy*  Lab Work:   Testing/Procedures:  NONE    NONE  Follow-Up: Your next appointment:  12 month(s) In Person with Shelva Majestic, MD   Please call our office 2 months in advance to schedule this appointment   At [facility] you and your health needs are our priority.  As part of our continuing mission to provide you with exceptional heart care, we have created designated Provider Care Teams.  These Care Teams include your primary Cardiologist (physician) and Advanced Practice Providers (APPs -  Physician Assistants and Nurse Practitioners) who all work together to provide you with the care you need, when you need it.  We recommend signing up for the patient portal called "MyChart".  Sign up information is provided on this After Visit Summary.  MyChart is used to connect with patients for Virtual Visits (Telemedicine).  Patients are able to view lab/test results, encounter notes, upcoming appointments, etc.  Non-urgent messages can be sent to your provider as well.   To learn more about what you can do with MyChart, go to [email or web address].

## 2021-04-02 ENCOUNTER — Encounter: Payer: Self-pay | Admitting: Cardiovascular Disease

## 2021-04-07 ENCOUNTER — Other Ambulatory Visit: Payer: Self-pay | Admitting: Internal Medicine

## 2021-04-14 ENCOUNTER — Encounter: Payer: Self-pay | Admitting: Urology

## 2021-04-14 NOTE — Progress Notes (Signed)
Spoke w/ patient, verified identity, and begin nursing interview. Patient states " Having nocturia x5-6, otherwise doing well." No other symptoms reported at this time.  Meaningful use complete. I-PSS score fo 5 (mild). Urology follow-up w/ [facility]-February, 2023- per patient.  Patient notified of 9:00am-04/19/21 w/ Ashlyn Bruning PA-C. I left my extension [phone] in case patient needs to call. Patient verbalized understanding of information.  Patient preferred contact [phone]

## 2021-04-19 ENCOUNTER — Other Ambulatory Visit: Payer: Self-pay | Admitting: Urology

## 2021-04-19 ENCOUNTER — Ambulatory Visit
Admission: RE | Admit: 2021-04-19 | Discharge: 2021-04-19 | Disposition: A | Discharge status: Home / Self Care | Payer: Medicare Other | Source: Ambulatory Visit | Attending: Urology | Admitting: Urology

## 2021-04-19 DIAGNOSIS — C61 Malignant neoplasm of prostate: Secondary | ICD-10-CM

## 2021-04-19 NOTE — Progress Notes (Signed)
Radiation Oncology         [phone]) [phone] ________________________________  Name: Aaron [name] MRN: [id number]  Date: 04/19/2021  DOB: [birth year]  Post Treatment Note  CC: Plotnikov, Evie Lacks, MD  Irine Seal, MD  Diagnosis:   86 y.o. 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  Interval Since Last Radiation:  5 weeks  01/12/21 - 03/14/21; concurrent with LT-ADT 1. The prostate, seminal vesicles, and pelvic lymph nodes were initially treated to 45 Gy in 25 fractions of 1.8 Gy  2. The prostate only was boosted to 75 Gy with 15 additional fractions of 2.0 Gy    Narrative:  I spoke with the patient to conduct his routine scheduled 1 month follow up visit via telephone to spare the patient unnecessary potential exposure in the healthcare setting during the current COVID-19 pandemic.  The patient was notified in advance and gave permission to proceed with this visit format.  He tolerated radiation treatment relatively well with only minor urinary irritation and modest fatigue.  He did report nocturia 5-6 times per night, urgency, frequency and intermittent dysuria at the start of his stream.  He also reported diarrhea which was improved with Imodium as needed.                              On review of systems, the patient states that he is doing well in general.  He continues with persistent frequency, urgency and nocturia x5 per night but reports that the dysuria has resolved and he denies any gross hematuria, incomplete bladder emptying or incontinence.  He reports a healthy appetite and is maintaining his weight.  He denies any abdominal pain, nausea, vomiting, diarrhea or constipation.  He feels like he continues to tolerate the Orgovyx ADT fairly well aside from fatigue and occasional hot flashes.  He also reports a single episode of fever, where his temperature had increased to 103.4 F but resolved  with a single dose of Tylenol and has not recurred since.  He was not having any associated urinary symptoms with the fever.  He has also noticed intermittent episodes of low blood pressure over the past week or two and plans to discuss this with his primary care physician.  Otherwise, overall, he is pleased with his progress to date.  ALLERGIES:  is allergic to amoxicillin, cefuroxime axetil, fluconazole, and escitalopram.  Meds: Current Outpatient Medications  Medication Sig Dispense Refill   apalutamide (ERLEADA) 60 MG tablet Take 240 mg by mouth as directed. Take 4 tablet at bedtime May be taken with or without food. Swallow tablets whole.     aspirin 81 MG EC tablet Take 81 mg by mouth daily.     CHOLECALCIFEROL PO Take 600 Units by mouth daily.     clonazePAM (KLONOPIN) 1 MG tablet TAKE 1/2 TO 1 TABLET BY  MOUTH TWICE DAILY AS NEEDED FOR ANXIETY 180 tablet 1   Dextromethorphan-guaiFENesin 20-400 MG TABS Take 1 tablet by mouth daily as needed.      finasteride (PROSCAR) 5 MG tablet TAKE 1 TABLET BY MOUTH  DAILY 90 tablet 3   levothyroxine (SYNTHROID) 125 MCG tablet TAKE 1 TABLET BY MOUTH  DAILY 90 tablet 1   metoprolol succinate (TOPROL-XL) 25 MG 24 hr tablet TAKE 1/2 ALTERNATING WITH 1/4 OF A TABLET BY  MOUTH DAILY 45 tablet 4   omeprazole (PRILOSEC)  40 MG capsule Take 1 capsule (40 mg total) by mouth daily. 90 capsule 3   Relugolix (ORGOVYX) 120 MG TABS 1 po qd 90 tablet 3   simvastatin (ZOCOR) 20 MG tablet TAKE 1 TABLET BY MOUTH  DAILY 90 tablet 1   No current facility-administered medications for this encounter.    Physical Findings:  vitals were not taken for this visit.  Pain Assessment Pain Score: 0-No pain/10 Unable to assess due to telephone follow-up visit format.  Lab Findings: Lab Results  Component Value Date   WBC 4.7 10/08/2019   HGB 14.2 10/08/2019   HCT 43.4 10/08/2019   MCV 89.7 10/08/2019   PLT 167 10/08/2019     Radiographic Findings: No results  found.  Impression/Plan: 1. 86 y.o. 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 He will continue to follow up with urology for ongoing PSA determinations and has an appointment scheduled with [name] in February 2023. He understands what to expect with regards to PSA monitoring going forward. I will look forward to following his response to treatment via correspondence with urology, and would be happy to continue to participate in his care if clinically indicated.  I strongly encouraged the patient to follow-up with his PCP and/or cardiologist, regarding the hypotension episodes.  I talked to the patient about what to expect in the future, including his risk for erectile dysfunction and rectal bleeding. I encouraged him to call or return to the office if he has any questions regarding his previous radiation or possible radiation side effects. He was comfortable with this plan and will follow up as needed.     Nicholos Johns, PA-C

## 2021-04-19 NOTE — Progress Notes (Signed)
°  Radiation Oncology         [phone]) [phone] ________________________________  Name: Aaron [name] MRN: [id number]  Date: 03/14/2021  DOB: 1935-07-16  End of Treatment Note  Diagnosis:   86 y.o. 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     Indication for treatment:  Curative, Definitive Radiotherapy       Radiation treatment dates:   01/12/21 - 03/14/21; concurrent with LT-ADT  Site/dose:  1. The prostate, seminal vesicles, and pelvic lymph nodes were initially treated to 45 Gy in 25 fractions of 1.8 Gy  2. The prostate only was boosted to 75 Gy with 15 additional fractions of 2.0 Gy   Beams/energy:  1. The prostate, seminal vesicles, and pelvic lymph nodes were initially treated using VMAT intensity modulated radiotherapy delivering 6 megavolt photons. Image guidance was performed with CB-CT studies prior to each fraction. He was immobilized with a body fix lower extremity mold.  2. the prostate only was boosted using VMAT intensity modulated radiotherapy delivering 6 megavolt photons. Image guidance was performed with CB-CT studies prior to each fraction. He was immobilized with a body fix lower extremity mold.  Narrative: The patient tolerated radiation treatment relatively well with only minor urinary irritation and modest fatigue.  He did report nocturia 5-6 times per night, urgency, frequency and intermittent dysuria at the start of his stream.  He also reported diarrhea which was improved with Imodium as needed.  Plan: The patient has completed radiation treatment. He will return to radiation oncology clinic for routine followup in one month. I advised him to call or return sooner if he has any questions or concerns related to his recovery or treatment. ________________________________  Sheral Apley. Tammi Klippel, M.D.

## 2021-04-28 ENCOUNTER — Other Ambulatory Visit: Payer: Self-pay | Admitting: Gastroenterology

## 2021-05-09 DIAGNOSIS — H04123 Dry eye syndrome of bilateral lacrimal glands: Secondary | ICD-10-CM | POA: Diagnosis not present

## 2021-05-09 DIAGNOSIS — H401211 Low-tension glaucoma, right eye, mild stage: Secondary | ICD-10-CM | POA: Diagnosis not present

## 2021-05-09 DIAGNOSIS — H25813 Combined forms of age-related cataract, bilateral: Secondary | ICD-10-CM | POA: Diagnosis not present

## 2021-05-09 DIAGNOSIS — H40022 Open angle with borderline findings, high risk, left eye: Secondary | ICD-10-CM | POA: Diagnosis not present

## 2021-05-23 ENCOUNTER — Telehealth: Payer: Self-pay | Admitting: *Deleted

## 2021-05-29 ENCOUNTER — Telehealth: Payer: Self-pay | Admitting: *Deleted

## 2021-05-31 DIAGNOSIS — H25813 Combined forms of age-related cataract, bilateral: Secondary | ICD-10-CM | POA: Diagnosis not present

## 2021-05-31 DIAGNOSIS — H40022 Open angle with borderline findings, high risk, left eye: Secondary | ICD-10-CM | POA: Diagnosis not present

## 2021-05-31 DIAGNOSIS — C61 Malignant neoplasm of prostate: Secondary | ICD-10-CM | POA: Diagnosis not present

## 2021-05-31 DIAGNOSIS — H04123 Dry eye syndrome of bilateral lacrimal glands: Secondary | ICD-10-CM | POA: Diagnosis not present

## 2021-05-31 DIAGNOSIS — H401211 Low-tension glaucoma, right eye, mild stage: Secondary | ICD-10-CM | POA: Diagnosis not present

## 2021-06-06 ENCOUNTER — Other Ambulatory Visit: Payer: Self-pay | Admitting: Internal Medicine

## 2021-06-07 DIAGNOSIS — C61 Malignant neoplasm of prostate: Secondary | ICD-10-CM | POA: Diagnosis not present

## 2021-06-14 ENCOUNTER — Other Ambulatory Visit: Payer: Self-pay | Admitting: Internal Medicine

## 2021-06-28 ENCOUNTER — Telehealth: Payer: Self-pay

## 2021-06-28 MED ORDER — OMEPRAZOLE 40 MG PO CPDR: 40.0000 mg | DELAYED_RELEASE_CAPSULE | Freq: Every day | ORAL | 0 refills | Status: DC

## 2021-06-28 NOTE — Telephone Encounter (Signed)
Called home number with no answer and left voicemail that I am sending a refill to his pharmacy but to call and make f/u visit with [name]. ?

## 2021-06-28 NOTE — Telephone Encounter (Signed)
Received request for refill of omeprazole 40 mg. Called patient to schedule f/u visit with [name] since he is due. Patient did not answer and could not leave a voicemail.  ?

## 2021-06-29 NOTE — Telephone Encounter (Signed)
Appointment scheduled 08-09-21 11:10am. ?

## 2021-07-17 ENCOUNTER — Encounter: Payer: Self-pay | Admitting: Internal Medicine

## 2021-07-17 ENCOUNTER — Ambulatory Visit (INDEPENDENT_AMBULATORY_CARE_PROVIDER_SITE_OTHER): Payer: Medicare Other | Admitting: Internal Medicine

## 2021-07-17 DIAGNOSIS — C61 Malignant neoplasm of prostate: Secondary | ICD-10-CM

## 2021-07-17 DIAGNOSIS — R1319 Other dysphagia: Secondary | ICD-10-CM | POA: Diagnosis not present

## 2021-07-17 DIAGNOSIS — I10 Essential (primary) hypertension: Secondary | ICD-10-CM

## 2021-07-17 DIAGNOSIS — R739 Hyperglycemia, unspecified: Secondary | ICD-10-CM | POA: Diagnosis not present

## 2021-07-17 DIAGNOSIS — I2510 Atherosclerotic heart disease of native coronary artery without angina pectoris: Secondary | ICD-10-CM

## 2021-07-17 DIAGNOSIS — F419 Anxiety disorder, unspecified: Secondary | ICD-10-CM

## 2021-07-17 DIAGNOSIS — R269 Unspecified abnormalities of gait and mobility: Secondary | ICD-10-CM | POA: Diagnosis not present

## 2021-07-17 DIAGNOSIS — K2100 Gastro-esophageal reflux disease with esophagitis, without bleeding: Secondary | ICD-10-CM | POA: Diagnosis not present

## 2021-07-17 DIAGNOSIS — E785 Hyperlipidemia, unspecified: Secondary | ICD-10-CM | POA: Diagnosis not present

## 2021-07-17 NOTE — Assessment & Plan Note (Addendum)
Use a cane ?DMV form ?

## 2021-07-17 NOTE — Assessment & Plan Note (Signed)
Chronic ?Continue on Prilosec ?

## 2021-07-17 NOTE — Assessment & Plan Note (Signed)
Monitor A1c 

## 2021-07-17 NOTE — Assessment & Plan Note (Signed)
No relapse 

## 2021-07-17 NOTE — Assessment & Plan Note (Signed)
Chronic  ?On Clonazepam prn ? Potential benefits of a long term benzodiazepines  use as well as potential risks  and complications were explained to the patient and were aknowledged. ?

## 2021-07-17 NOTE — Progress Notes (Signed)
? ?Subjective:  ?Patient ID: Aaron [name], male    DOB: 26-Oct-1935  Age: 86 y.o. MRN: [id number] ? ?CC: Follow-up (Would like to discuss handicap sticker) ? ? ?HPI ?Aaron [name] presents for prostate cancer w/mets - was on XRT; lost wt ?F/u anxiety, dyslipidemia, GERD ? ?Outpatient Medications Prior to Visit  ?Medication Sig Dispense Refill  ? apalutamide (ERLEADA) 60 MG tablet Take 240 mg by mouth as directed. Take 4 tablet at bedtime May be taken with or without food. Swallow tablets whole.    ? aspirin 81 MG EC tablet Take 81 mg by mouth daily.    ? CHOLECALCIFEROL PO Take 600 Units by mouth daily.    ? clonazePAM (KLONOPIN) 1 MG tablet TAKE 1/2 TO 1 TABLET BY  MOUTH TWICE DAILY AS NEEDED FOR ANXIETY 180 tablet 1  ? Dextromethorphan-guaiFENesin 20-400 MG TABS Take 1 tablet by mouth daily as needed.     ? finasteride (PROSCAR) 5 MG tablet TAKE 1 TABLET BY MOUTH  DAILY 90 tablet 3  ? levothyroxine (SYNTHROID) 125 MCG tablet TAKE 1 TABLET BY MOUTH  DAILY 90 tablet 1  ? metoprolol succinate (TOPROL-XL) 25 MG 24 hr tablet TAKE 1/2 ALTERNATING WITH 1/4 OF A TABLET BY  MOUTH DAILY 45 tablet 4  ? omeprazole (PRILOSEC) 40 MG capsule Take 1 capsule (40 mg total) by mouth daily. 90 capsule 0  ? Relugolix (ORGOVYX) 120 MG TABS 1 po qd (Patient taking differently: 2 pills in AM) 90 tablet 3  ? simvastatin (ZOCOR) 20 MG tablet TAKE 1 TABLET BY MOUTH  DAILY 90 tablet 1  ? ?No facility-administered medications prior to visit.  ? ? ?ROS: ?Review of Systems  ?Constitutional:  Positive for fatigue. Negative for appetite change and unexpected weight change.  ?HENT:  Negative for congestion, nosebleeds, sneezing, sore throat and trouble swallowing.   ?Eyes:  Negative for itching and visual disturbance.  ?Respiratory:  Negative for cough.   ?Cardiovascular:  Negative for chest pain, palpitations and leg swelling.  ?Gastrointestinal:  Negative for abdominal distention, blood in stool, diarrhea and nausea.  ?Genitourinary:   Negative for frequency and hematuria.  ?Musculoskeletal:  Positive for arthralgias and gait problem. Negative for back pain, joint swelling and neck pain.  ?Skin:  Negative for rash.  ?Neurological:  Positive for weakness. Negative for dizziness, tremors and speech difficulty.  ?Hematological:  Does not bruise/bleed easily.  ?Psychiatric/Behavioral:  Negative for agitation, dysphoric mood, sleep disturbance and suicidal ideas. The patient is not nervous/anxious.   ? ?Objective:  ?BP (!) 112/58 (BP Location: Left Arm, Patient Position: Sitting, Cuff Size: Large)   Pulse 81   Temp 97.6 ?F (36.4 ?C) (Temporal)   Ht '6\' 3"'$  (1.905 m)   Wt 196 lb (88.9 kg)   SpO2 98%   BMI 24.50 kg/m?  ? ?BP Readings from Last 3 Encounters:  ?07/17/21 (!) 112/58  ?03/31/21 (!) 110/58  ?12/30/20 100/60  ? ? ?Wt Readings from Last 3 Encounters:  ?07/17/21 196 lb (88.9 kg)  ?03/31/21 199 lb 12.8 oz (90.6 kg)  ?12/30/20 204 lb (92.5 kg)  ? ? ?Physical Exam ?Constitutional:   ?   General: He is not in acute distress. ?   Appearance: Normal appearance. He is well-developed.  ?   Comments: NAD  ?Eyes:  ?   Conjunctiva/sclera: Conjunctivae normal.  ?   Pupils: Pupils are equal, round, and reactive to light.  ?Neck:  ?   Thyroid: No thyromegaly.  ?   Vascular: No JVD.  ?  Cardiovascular:  ?   Rate and Rhythm: Normal rate and regular rhythm.  ?   Heart sounds: Normal heart sounds. No murmur heard. ?  No friction rub. No gallop.  ?Pulmonary:  ?   Effort: Pulmonary effort is normal. No respiratory distress.  ?   Breath sounds: Normal breath sounds. No wheezing or rales.  ?Chest:  ?   Chest wall: No tenderness.  ?Abdominal:  ?   General: Bowel sounds are normal. There is no distension.  ?   Palpations: Abdomen is soft. There is no mass.  ?   Tenderness: There is no abdominal tenderness. There is no guarding or rebound.  ?Musculoskeletal:     ?   General: No tenderness. Normal range of motion.  ?   Cervical back: Normal range of motion.   ?Lymphadenopathy:  ?   Cervical: No cervical adenopathy.  ?Skin: ?   General: Skin is warm and dry.  ?   Findings: No rash.  ?Neurological:  ?   Mental Status: He is alert and oriented to person, place, and time.  ?   Cranial Nerves: No cranial nerve deficit.  ?   Motor: No abnormal muscle tone.  ?   Coordination: Coordination normal.  ?   Gait: Gait normal.  ?   Deep Tendon Reflexes: Reflexes are normal and symmetric.  ?Psychiatric:     ?   Behavior: Behavior normal.     ?   Thought Content: Thought content normal.     ?   Judgment: Judgment normal.  ? ?Using a cane ? ?DMV form ?Lab Results  ?Component Value Date  ? WBC 4.7 10/08/2019  ? HGB 14.2 10/08/2019  ? HCT 43.4 10/08/2019  ? PLT 167 10/08/2019  ? GLUCOSE 98 12/14/2020  ? CHOL 180 10/08/2019  ? TRIG 140 10/08/2019  ? HDL 64 10/08/2019  ? [place] 92 10/08/2019  ? ALT 12 12/14/2020  ? AST 24 12/14/2020  ? NA 135 12/14/2020  ? K 3.8 12/14/2020  ? CL 100 12/14/2020  ? CREATININE 1.55 (H) 12/14/2020  ? BUN 22 12/14/2020  ? CO2 29 12/14/2020  ? TSH 1.42 12/14/2020  ? PSA 10.87 (H) 04/20/2020  ? INR 1.19 04/28/2009  ? HGBA1C 7.1 (H) 12/14/2020  ? ? ?No results found. ? ?Assessment & Plan:  ? ?Problem List Items Addressed This Visit   ? ? Dyslipidemia  ?  Chronic ?Cont on Simvastatin  ?  ?  ? Relevant Orders  ? CBC with Differential/Platelet  ? Comprehensive metabolic panel  ? Hemoglobin A1c  ? Essential hypertension  ?  On NAS diet ? ?  ?  ? Relevant Orders  ? CBC with Differential/Platelet  ? Comprehensive metabolic panel  ? Hemoglobin A1c  ? GERD  ?  Chronic ?Continue on Prilosec ?  ?  ? DYSPHAGIA  ?  No relapse ? ?  ?  ? Hyperglycemia  ?  Monitor A1c ? ?  ?  ? Relevant Orders  ? Hemoglobin A1c  ? Prostate cancer ([place])  ?  Prostate cancer w/mets - was on XRT 2023. On Erleada and Orgovyx ?  ?  ? Relevant Orders  ? CBC with Differential/Platelet  ? Comprehensive metabolic panel  ? Hemoglobin A1c  ? Anxiety  ?  Chronic  ?On Clonazepam prn ? Potential benefits of a  long term benzodiazepines  use as well as potential risks  and complications were explained to the patient and were aknowledged. ? ?  ?  ?  Gait disturbance  ?  Use a cane ?DMV form ?  ?  ?  ? ? ?No orders of the defined types were placed in this encounter. ?  ? ? ?Follow-up: Return in about 6 months (around 01/16/2022) for Wellness Exam. ? ?Walker Kehr, MD ?

## 2021-07-17 NOTE — Assessment & Plan Note (Signed)
On NAS diet  

## 2021-07-17 NOTE — Assessment & Plan Note (Signed)
Chronic ?Cont on Simvastatin  ?

## 2021-07-17 NOTE — Assessment & Plan Note (Addendum)
Prostate cancer w/mets - was on XRT 2023. On Erleada and Orgovyx ?

## 2021-08-09 ENCOUNTER — Ambulatory Visit (INDEPENDENT_AMBULATORY_CARE_PROVIDER_SITE_OTHER): Payer: Medicare Other | Admitting: Gastroenterology

## 2021-08-09 ENCOUNTER — Encounter: Payer: Self-pay | Admitting: Gastroenterology

## 2021-08-09 VITALS — BP 110/62 | HR 86 | Ht 75.0 in | Wt 193.0 lb

## 2021-08-09 DIAGNOSIS — Z8501 Personal history of malignant neoplasm of esophagus: Secondary | ICD-10-CM

## 2021-08-09 DIAGNOSIS — I2510 Atherosclerotic heart disease of native coronary artery without angina pectoris: Secondary | ICD-10-CM | POA: Diagnosis not present

## 2021-08-09 DIAGNOSIS — Z8719 Personal history of other diseases of the digestive system: Secondary | ICD-10-CM | POA: Diagnosis not present

## 2021-08-09 DIAGNOSIS — K219 Gastro-esophageal reflux disease without esophagitis: Secondary | ICD-10-CM

## 2021-08-09 MED ORDER — OMEPRAZOLE 40 MG PO CPDR: 40.0000 mg | DELAYED_RELEASE_CAPSULE | Freq: Every day | ORAL | 3 refills | Status: DC

## 2021-08-09 NOTE — Progress Notes (Signed)
? ? ?  Assessment   ?  ?GERD ?History of esophageal strictures post chemoradiation for squamous cell esophageal cancer ? ? ?Recommendations  ?  ?Follow antireflux measures and continue omeprazole 40 mg daily. REV in 1 year. ? ? ?HPI  ?  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  He underwent esophageal stricture dilations in the past.  His reflux symptoms are very well controlled and he has no dysphagia.  He underwent radiation treatments for prostate cancer that ended in December 2022. ? ? ?Labs / Imaging  ?  ? ?  Latest Ref Rng & Units 12/14/2020  ?  2:02 PM 04/20/2020  ?  2:05 PM 10/08/2019  ? 11:19 AM  ?Hepatic Function  ?Total Protein 6.0 - 8.3 g/dL 8.5   8.1   8.2    ?Albumin 3.5 - 5.2 g/dL 4.4   4.5     ?AST 0 - 37 U/L _0 ?ALT 0 - 53 U/L _1 ?Alk Phosphatase 39 - 117 U/L 81   68     ?Total Bilirubin 0.2 - 1.2 mg/dL 0.4   0.5   0.4    ?Bilirubin, Direct 0.0 - 0.2 mg/dL   0.1    ? ? ? ?  Latest Ref Rng & Units 10/08/2019  ? 11:19 AM 10/09/2018  ? 10:32 AM 04/04/2017  ?  9:53 AM  ?CBC  ?WBC 3.8 - 10.8 Thousand/uL 4.7   4.0   3.9    ?Hemoglobin 13.2 - 17.1 g/dL 14.2   14.7   14.5    ?Hematocrit 38.5 - 50.0 % 43.4   44.1   44.1    ?Platelets 140 - 400 Thousand/uL 167   170.0   178.0    ? ? ? ?No results found. ? ? ?Current Medications, Allergies, Past Medical History, Past Surgical History, Family History and Social History were reviewed in [employer] record. ? ? ?Physical Exam: ?General: Well developed, well nourished, no acute distress ?Head: Normocephalic and atraumatic ?Eyes: Sclerae anicteric, EOMI ?Ears: Normal auditory acuity ?Mouth: Not examined, mask on during Covid-19 pandemic ?Lungs: Clear throughout to auscultation ?Heart: Regular rate and rhythm; no murmurs, rubs or bruits ?Abdomen: Soft, non tender and non distended. No masses, hepatosplenomegaly or hernias noted. Normal Bowel sounds ?Rectal: Not  done ?Musculoskeletal: Symmetrical with no gross deformities  ?Pulses:  Normal pulses noted ?Extremities: No clubbing, cyanosis, edema or deformities noted ?Neurological: Alert oriented x 4, grossly nonfocal ?Psychological:  Alert and cooperative. Normal mood and affect ? ? ?Pricilla Riffle. Fuller Plan, MD 08/09/2021, 11:20 AM  ?

## 2021-08-09 NOTE — Patient Instructions (Signed)
We have sent the following prescriptions to your mail in pharmacy: omeprazole. ? ? ?If you have not heard from your mail in pharmacy within 1 week or if you have not received your medication in the mail, please contact us at [phone] so we may find out why. ? ?The [facility] providers would like to encourage you to use [facility] to communicate with providers for non-urgent requests or questions.  Due to long hold times on the telephone, sending your provider a message by [facility] may be a faster and more efficient way to get a response.  Please allow 48 business hours for a response.  Please remember that this is for non-urgent requests.  ? ?Thank you for choosing me and [facility]. ? ?Malcolm T. Dagoberto Ligas., MD., [facility] ? ?

## 2021-08-18 ENCOUNTER — Ambulatory Visit: Payer: Medicare Other

## 2021-09-01 NOTE — Progress Notes (Cosign Needed Addendum)
This encounter was created in error - please disregard.  Medical screening examination/treatment/procedure(s) were performed by non-physician practitioner and as supervising physician I was immediately available for consultation/collaboration.  I agree with above. Lew Dawes, MD

## 2021-09-05 DIAGNOSIS — R310 Gross hematuria: Secondary | ICD-10-CM | POA: Diagnosis not present

## 2021-09-07 ENCOUNTER — Ambulatory Visit (INDEPENDENT_AMBULATORY_CARE_PROVIDER_SITE_OTHER): Payer: Medicare Other

## 2021-09-07 DIAGNOSIS — Z0000 Encounter for general adult medical examination without abnormal findings: Secondary | ICD-10-CM | POA: Diagnosis not present

## 2021-09-07 NOTE — Patient Instructions (Signed)
It was great speaking with you today!  Please schedule your next Medicare Wellness Visit with your Nurse Health Advisor in 1 year by calling [phone]. 

## 2021-09-07 NOTE — Progress Notes (Cosign Needed Addendum)
Subjective:   Aaron [name] is a 86 y.o. male who presents for Medicare Annual/Subsequent preventive examination.  I connected with Aaron [name] today by telephone and verified that I am speaking with the correct person using two identifiers. I discussed the limitations, risks, security and privacy concerns of performing an evaluation and management service by telephone and the availability of in person appointments. I also discussed with the patient that there may be a patient responsible charge related to this service. The patient expressed understanding and agreed to proceed. Location patient: home Location provider: work  Persons participating in the virtual visit: Abednego and Jillene Bucks, [place].   Review of Systems    No ROS. Medicare Wellness Telephone Visit. Additional risk factors are reflected in social history. Cardiac Risk Factors include: male gender;advanced age (>55mn, >>23women)     Objective:    There were no vitals filed for this visit. There is no height or weight on file to calculate BMI.     09/07/2021    2:58 PM 04/14/2021    9:08 AM 11/15/2020    9:36 AM 08/12/2020   10:15 AM 07/24/2019   12:02 PM 05/03/2016   11:35 AM 04/28/2015    9:27 AM  Advanced Directives  Does Patient Have a Medical Advance Directive? Yes Yes Yes Yes Yes No No  Type of Advance Directive  Living will;Healthcare Power of Attorney Living will Living will;[place]care [place] of [place]    Does patient want to make changes to medical advance directive?    No - Patient declined No - Patient declined    Copy of [place] Chart?    No - copy requested No - copy requested    Would patient like information on creating a medical advance directive?       No - patient declined information    Current Medications (verified) Outpatient Encounter Medications as of 09/07/2021  Medication Sig   apalutamide (ERLEADA) 60 MG tablet Take 240 mg by  mouth as directed. Take 4 tablet at bedtime May be taken with or without food. Swallow tablets whole.   aspirin 81 MG EC tablet Take 81 mg by mouth daily.   CHOLECALCIFEROL PO Take 600 Units by mouth daily.   clonazePAM (KLONOPIN) 1 MG tablet TAKE 1/2 TO 1 TABLET BY  MOUTH TWICE DAILY AS NEEDED FOR ANXIETY   Dextromethorphan-guaiFENesin 20-400 MG TABS Take 1 tablet by mouth daily as needed.    finasteride (PROSCAR) 5 MG tablet TAKE 1 TABLET BY MOUTH  DAILY   levothyroxine (SYNTHROID) 125 MCG tablet TAKE 1 TABLET BY MOUTH  DAILY   metoprolol succinate (TOPROL-XL) 25 MG 24 hr tablet TAKE 1/2 ALTERNATING WITH 1/4 OF A TABLET BY  MOUTH DAILY   omeprazole (PRILOSEC) 40 MG capsule Take 1 capsule (40 mg total) by mouth daily.   Relugolix (ORGOVYX) 120 MG TABS 1 po qd (Patient taking differently: 2 pills in AM)   simvastatin (ZOCOR) 20 MG tablet TAKE 1 TABLET BY MOUTH  DAILY   No facility-administered encounter medications on file as of 09/07/2021.    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, now removed    Esophageal stricture    Gastritis    GERD (gastroesophageal reflux disease)    Hemorrhoids    Hiatal hernia    History of chemotherapy 05/03/09-06/16/09   Taxol/carboplatin   History of nuclear stress test 07/2008   exercise; mild-mod perfusion dfect in basal inf/mid inf/apical inf regions; EKG negative for ischemia   History of radiation therapy 05/03/09-06/16/09   squamous cell ca of esophagus   Hyperlipidemia    [name]   Hypothyroid    s/p radiotherapy   Prostate cancer [facility])    Past Surgical History:  Procedure  Laterality Date   CARDIAC CATHETERIZATION  08/2008   mild nonobstructive CAD with 30% LAD stenosis prox & 20% OM1 narrowing    COLONOSCOPY     CREATION OF CUTANEOUS STOMA  2011   ESOPHAGOGASTRODUODENOSCOPY  10/03/2011   Procedure: ESOPHAGOGASTRODUODENOSCOPY (EGD);  Surgeon: [name], MD,FACG;  Location: [name];  Service: Endoscopy;  Laterality: N/A;  no fluro needed   Lower Back Cyst Removed     [facility]   POLYPECTOMY     [name] DILATION  10/03/2011   Procedure: [name] DILATION;  Surgeon: [name], MD,FACG;  Location: [facility];  Service: Endoscopy;  Laterality: N/A;   TONSILLECTOMY     age 70   TRANSTHORACIC ECHOCARDIOGRAM  06/2009   EF=>55%; trace MR/TR   UPPER GASTROINTESTINAL ENDOSCOPY     Family History  Problem Relation Age of Onset   Prostate cancer Father        59's   Diabetes Father    Stroke Mother        aneurism   Stroke Maternal Grandfather 50   Prostate cancer Brother 63   Colon polyps Brother    Prostate cancer Brother 65   Colon cancer Sister    Coronary artery disease Neg Hx    Hypertension Neg Hx    Stomach cancer Neg Hx    Rectal cancer Neg Hx    Social History   Socioeconomic History   Marital status: Married    Spouse name: Not on file   Number of children: 3   Years of education: 12th   Highest education level: Not on file  Occupational History   Occupation: Retired, [place] [employer]    Employer: RETIRED    Comment: Supervisor  Tobacco Use   Smoking status: Former    Packs/day: 1.00    Years: 40.00    Total pack years: 40.00    Types: Cigarettes    Quit date: 01/16/2001    Years since quitting: 20.6   Smokeless tobacco: Never   Tobacco comments:    Quit 2002  Vaping Use   Vaping Use: Never used  Substance and Sexual Activity   Alcohol use: Yes    Alcohol/week: 0.0 standard drinks of alcohol    Comment: occasional drink 2-3 a month   Drug use: No    Comment: quit smoking 2002   Sexual activity: Yes   Other Topics Concern   Not on file  Social History Narrative   Daily Caffeine - 1      Social Determinants of Health   Financial Resource Strain: Low Risk  (09/07/2021)   Overall Financial Resource Strain (CARDIA)    Difficulty of Paying Living Expenses: Not hard at all  Food Insecurity: No Food Insecurity (09/07/2021)   Hunger Vital Sign  Worried About Charity fundraiser in the Last Year: Never true    [place] in the Last Year: Never true  Transportation Needs: No Transportation Needs (09/07/2021)   PRAPARE - Hydrologist (Medical): No    Lack of Transportation (Non-Medical): No  Physical Activity: Insufficiently Active (09/07/2021)   Exercise Vital Sign    Days of Exercise per Week: 2 days    Minutes of Exercise per Session: 20 min  Stress: No Stress Concern Present (09/07/2021)   [place]    Feeling of Stress : Not at all  Social Connections: [place] (09/07/2021)   Social Connection and Isolation Panel [NHANES]    Frequency of Communication with Friends and Family: More than three times a week    Frequency of Social Gatherings with Friends and Family: Once a week    Attends Religious Services: More than 4 times per year    Active Member of [employer] or Organizations: Yes    Attends Archivist Meetings: 1 to 4 times per year    Marital Status: Married    Tobacco Counseling Counseling given: Not Answered Tobacco comments: Quit 2002   Clinical Intake:  Pre-visit preparation completed: Yes  Pain : No/denies pain     Nutritional Risks: None Diabetes: No  How often do you need to have someone help you when you read instructions, pamphlets, or other written materials from your doctor or pharmacy?: 1 - Never What is the last grade level you completed in school?: high school  Diabetic?no  Interpreter Needed?: No  Information entered by :: Jillene Bucks, [place]   Activities of Daily Living    09/07/2021    2:59 PM  In your present state of health, do you have any difficulty performing the following activities:  Hearing? 1  Vision? 0  Difficulty concentrating or making decisions? 0  Comment only sometimes  Walking or climbing stairs? 0  Dressing or bathing? 0  Doing errands, shopping? 0  Preparing Food and eating ? N  Using the Toilet? N  In the past six months, have you accidently leaked urine? N  Do you have problems with loss of bowel control? N  Managing your Medications? N  Managing your Finances? N  Housekeeping or managing your Housekeeping? N    Patient Care Team: Plotnikov, Evie Lacks, MD as PCP - General [name] Joyice Faster, MD as PCP - Cardiology (Cardiology) Tyler Pita, MD (Radiation Oncology) Ladell Pier, MD as Attending Physician (Hematology and Oncology) [name], MD (Gastroenterology) Hortencia Pilar, MD as Consulting Physician (Ophthalmology) Festus Aloe, MD as Consulting Physician (Urology)  Indicate any recent Medical Services you may have received from other than [facility] providers in the past year (date may be approximate).     Assessment:   This is a routine wellness examination for Aaron [name].  Hearing/Vision screen Patient has some hearing difficulty but does not wear hearing aids. Patient has reading glasses.  Dietary issues and exercise activities discussed: Current Exercise Habits: The patient does not participate in regular exercise at present, Exercise limited by: None identified   Goals Addressed             This Visit's Progress    Patient Stated       To stay healthy       Depression Screen    09/07/2021    2:56 PM 07/17/2021    2:37 PM  08/12/2020   10:02 AM 07/24/2019   12:03 PM 10/24/2017   10:19 AM 08/30/2016    9:21 AM 01/31/2015    2:44 PM  PHQ 2/9 Scores  PHQ - 2 Score 0 2 0 0 0 0 0    Fall Risk    09/07/2021    2:59 PM 07/17/2021    2:38 PM  08/12/2020   10:01 AM 07/24/2019   12:03 PM 02/18/2019   10:17 AM  Fall Risk   Falls in the past year? 0 1 0 0 0  Comment     Emmi Telephone Survey: data to providers prior to load  Number falls in past yr: 0 1 0 0   Injury with Fall? 0 0 0 0   Risk for fall due to : No Fall Risks History of fall(s) No Fall Risks No Fall Risks   Follow up Falls evaluation completed  Falls evaluation completed Falls evaluation completed;Education provided;Falls prevention discussed     FALL RISK PREVENTION PERTAINING TO THE HOME:  Any stairs in or around the home? Yes  If so, are there any without handrails? No  Home free of loose throw rugs in walkways, pet beds, electrical cords, etc? Yes  Adequate lighting in your home to reduce risk of falls? Yes   ASSISTIVE DEVICES UTILIZED TO PREVENT FALLS:  Life alert? No  Use of a cane, walker or w/c? Yes  Grab bars in the bathroom? Yes  Shower chair or bench in shower? No  Elevated toilet seat or a handicapped toilet? Yes   TIMED UP AND GO:  Was the test performed? No .  Length of time to ambulate 10 feet: N/A sec.    Cognitive Function:  Patient is cogitatively intact.      07/24/2019   12:04 PM  6CIT Screen  What Year? 0 points  What month? 0 points  What time? 0 points  Count back from 20 0 points  Months in reverse 0 points  Repeat phrase 2 points  Total Score 2 points    Immunizations Immunization History  Administered Date(s) Administered   Fluad Quad(high Dose 65+) 12/14/2020   Influenza Split 12/27/2010, 12/13/2011   Influenza Whole 02/04/2004, 12/25/2007, 01/04/2010   Influenza, High Dose Seasonal PF 01/01/2015, 12/29/2015, 11/29/2016, 01/07/2018, 11/16/2018   Influenza, Seasonal, Injecte, Preservative Fre 12/19/2012   Influenza-Unspecified 12/23/2013   PFIZER(Purple Top)SARS-COV-2 Vaccination 04/16/2019, 05/07/2019, 01/06/2020, 07/29/2020   Pneumococcal Conjugate-13 01/22/2013   Pneumococcal Polysaccharide-23 02/09/2004,  03/04/2008, 09/01/2015   Td 08/26/2006   Tdap 03/29/2016   Zoster Recombinat (Shingrix) 05/13/2020    TDAP status: Up to date  Flu Vaccine status: Up to date  Pneumococcal vaccine status: Up to date  Covid-19 vaccine status: Completed vaccines  Qualifies for Shingles Vaccine? Yes   Zostavax completed No   Shingrix Completed?: No.    Education has been provided regarding the importance of this vaccine. Patient has been advised to call insurance company to determine out of pocket expense if they have not yet received this vaccine. Advised may also receive vaccine at local pharmacy or Health Dept. Verbalized acceptance and understanding.  Screening Tests Health Maintenance  Topic Date Due   Zoster Vaccines- Shingrix (2 of 2) 07/08/2020   COVID-19 Vaccine (5 - Booster for Pfizer series) 09/23/2021 (Originally 09/23/2020)   INFLUENZA VACCINE  10/24/2021   TETANUS/TDAP  03/29/2026   Pneumonia Vaccine 5+ Years old  Completed   HPV VACCINES  Aged Out    Health Maintenance  Health Maintenance  Due  Topic Date Due   Zoster Vaccines- Shingrix (2 of 2) 07/08/2020    Colorectal cancer screening: No longer required.   Lung Cancer Screening: (Low Dose CT Chest recommended if Age 70-80 years, 30 pack-year currently smoking OR have quit w/in 15years.) does not qualify.   Lung Cancer Screening Referral: n/a  Additional Screening:  Hepatitis C Screening: does not qualify; Completed n/a  Vision Screening: Recommended annual ophthalmology exams for early detection of glaucoma and other disorders of the eye. Is the patient up to date with their annual eye exam?  Yes  Who is the provider or what is the name of the office in which the patient attends annual eye exams? Dr. Herbert Deaner If pt is not established with a provider, would they like to be referred to a provider to establish care? No .   Dental Screening: Recommended annual dental exams for proper oral hygiene  Community Resource  Referral / Chronic Care Management: CRR required this visit?  No   CCM required this visit?  No      Plan:     I have personally reviewed and noted the following in the patient's chart:   Medical and social history Use of alcohol, tobacco or illicit drugs  Current medications and supplements including opioid prescriptions. Patient is not currently taking opioid prescriptions. Functional ability and status Nutritional status Physical activity Advanced directives List of other physicians Hospitalizations, surgeries, and ER visits in previous 12 months Vitals Screenings to include cognitive, depression, and falls Referrals and appointments  In addition, I have reviewed and discussed with patient certain preventive protocols, quality metrics, and best practice recommendations. A written personalized care plan for preventive services as well as general preventive health recommendations were provided to patient.     Rossie Muskrat, [place]   09/07/2021   Nurse Notes:   Time Spent with patient on telephone encounter: 30 mins  Medical screening examination/treatment/procedure(s) were performed by non-physician practitioner and as supervising physician I was immediately available for consultation/collaboration.  I agree with above. Lew Dawes, MD

## 2021-10-24 DIAGNOSIS — H40022 Open angle with borderline findings, high risk, left eye: Secondary | ICD-10-CM | POA: Diagnosis not present

## 2021-10-24 DIAGNOSIS — E0500 Thyrotoxicosis with diffuse goiter without thyrotoxic crisis or storm: Secondary | ICD-10-CM | POA: Diagnosis not present

## 2021-10-24 DIAGNOSIS — H04123 Dry eye syndrome of bilateral lacrimal glands: Secondary | ICD-10-CM | POA: Diagnosis not present

## 2021-10-24 DIAGNOSIS — H401211 Low-tension glaucoma, right eye, mild stage: Secondary | ICD-10-CM | POA: Diagnosis not present

## 2021-11-25 ENCOUNTER — Other Ambulatory Visit: Payer: Self-pay | Admitting: Internal Medicine

## 2021-11-28 NOTE — Telephone Encounter (Signed)
Check [place] registry last filled 09/27/2021. MD out of the office until Monday 9/11. Pls advise on refill.Marland KitchenChryl Heck

## 2021-11-28 NOTE — Telephone Encounter (Signed)
Binnie Rail, MD  Earnstine Regal, RMA Caller: Unspecified (3 days ago,  1:35 PM) Looks like prescription was filled 7/5 for a 3 month quantity so he should not be due until the beginning of October.        Denied script until ov../l,mb

## 2021-11-29 DIAGNOSIS — C61 Malignant neoplasm of prostate: Secondary | ICD-10-CM | POA: Diagnosis not present

## 2021-12-06 ENCOUNTER — Other Ambulatory Visit: Payer: Self-pay | Admitting: Internal Medicine

## 2021-12-06 DIAGNOSIS — C778 Secondary and unspecified malignant neoplasm of lymph nodes of multiple regions: Secondary | ICD-10-CM | POA: Diagnosis not present

## 2021-12-06 DIAGNOSIS — C61 Malignant neoplasm of prostate: Secondary | ICD-10-CM | POA: Diagnosis not present

## 2021-12-18 ENCOUNTER — Other Ambulatory Visit: Payer: Self-pay | Admitting: Internal Medicine

## 2021-12-26 DIAGNOSIS — Z23 Encounter for immunization: Secondary | ICD-10-CM | POA: Diagnosis not present

## 2022-01-15 ENCOUNTER — Ambulatory Visit (INDEPENDENT_AMBULATORY_CARE_PROVIDER_SITE_OTHER): Payer: Medicare Other | Admitting: Internal Medicine

## 2022-01-15 ENCOUNTER — Encounter: Payer: Self-pay | Admitting: Internal Medicine

## 2022-01-15 ENCOUNTER — Other Ambulatory Visit: Payer: Self-pay | Admitting: Internal Medicine

## 2022-01-15 DIAGNOSIS — I10 Essential (primary) hypertension: Secondary | ICD-10-CM

## 2022-01-15 DIAGNOSIS — E039 Hypothyroidism, unspecified: Secondary | ICD-10-CM | POA: Diagnosis not present

## 2022-01-15 DIAGNOSIS — C61 Malignant neoplasm of prostate: Secondary | ICD-10-CM

## 2022-01-15 DIAGNOSIS — F419 Anxiety disorder, unspecified: Secondary | ICD-10-CM

## 2022-01-15 DIAGNOSIS — R739 Hyperglycemia, unspecified: Secondary | ICD-10-CM

## 2022-01-15 DIAGNOSIS — I2510 Atherosclerotic heart disease of native coronary artery without angina pectoris: Secondary | ICD-10-CM

## 2022-01-15 DIAGNOSIS — E785 Hyperlipidemia, unspecified: Secondary | ICD-10-CM | POA: Diagnosis not present

## 2022-01-15 DIAGNOSIS — H6123 Impacted cerumen, bilateral: Secondary | ICD-10-CM | POA: Diagnosis not present

## 2022-01-15 DIAGNOSIS — N183 Chronic kidney disease, stage 3 unspecified: Secondary | ICD-10-CM | POA: Diagnosis not present

## 2022-01-15 LAB — COMPREHENSIVE METABOLIC PANEL
ALT: 10 U/L (ref 0–53)
AST: 20 U/L (ref 0–37)
Albumin: 4.4 g/dL (ref 3.5–5.2)
Alkaline Phosphatase: 66 U/L (ref 39–117)
BUN: 25 mg/dL — ABNORMAL HIGH (ref 6–23)
CO2: 27 mEq/L (ref 19–32)
Calcium: 10.7 mg/dL — ABNORMAL HIGH (ref 8.4–10.5)
Chloride: 97 mEq/L (ref 96–112)
Creatinine, Ser: 1.67 mg/dL — ABNORMAL HIGH (ref 0.40–1.50)
GFR: 37.47 mL/min — ABNORMAL LOW (ref >60.00)
Glucose, Bld: 108 mg/dL — ABNORMAL HIGH (ref 70–99)
Potassium: 4.3 mEq/L (ref 3.5–5.1)
Sodium: 133 mEq/L — ABNORMAL LOW (ref 135–145)
Total Bilirubin: 0.4 mg/dL (ref 0.2–1.2)
Total Protein: 8.5 g/dL — ABNORMAL HIGH (ref 6.0–8.3)

## 2022-01-15 LAB — HEMOGLOBIN A1C: Hgb A1c MFr Bld: 6.9 % — ABNORMAL HIGH (ref 4.6–6.5)

## 2022-01-15 LAB — CBC WITH DIFFERENTIAL/PLATELET
Basophils Absolute: 0 10*3/uL (ref 0.0–0.1)
Basophils Relative: 0.4 % (ref 0.0–3.0)
Eosinophils Absolute: 0.1 10*3/uL (ref 0.0–0.7)
Eosinophils Relative: 1.9 % (ref 0.0–5.0)
HCT: 32.8 % — ABNORMAL LOW (ref 39.0–52.0)
Hemoglobin: 10.9 g/dL — ABNORMAL LOW (ref 13.0–17.0)
Lymphocytes Relative: 11.2 % — ABNORMAL LOW (ref 12.0–46.0)
Lymphs Abs: 0.6 10*3/uL — ABNORMAL LOW (ref 0.7–4.0)
MCHC: 33.1 g/dL (ref 30.0–36.0)
MCV: 96.3 fl (ref 78.0–100.0)
Monocytes Absolute: 0.5 10*3/uL (ref 0.1–1.0)
Monocytes Relative: 10 % (ref 3.0–12.0)
Neutro Abs: 3.9 10*3/uL (ref 1.4–7.7)
Neutrophils Relative %: 76.5 % (ref 43.0–77.0)
Platelets: 193 10*3/uL (ref 150.0–400.0)
RBC: 3.41 Mil/uL — ABNORMAL LOW (ref 4.22–5.81)
RDW: 14.4 % (ref 11.5–15.5)
WBC: 5.2 10*3/uL (ref 4.0–10.5)

## 2022-01-15 LAB — TSH: TSH: 120.78 u[IU]/mL — ABNORMAL HIGH (ref 0.35–5.50)

## 2022-01-15 NOTE — Assessment & Plan Note (Signed)
Chronic Cont on Simvastatin  Monitor labs - lipids, LFTs

## 2022-01-15 NOTE — Assessment & Plan Note (Signed)
Hydrate well 

## 2022-01-15 NOTE — Assessment & Plan Note (Addendum)
On Rx: continue on ASA, Simvastatin

## 2022-01-15 NOTE — Assessment & Plan Note (Signed)
On Clonazepam prn  Potential benefits of a long term benzodiazepines  use as well as potential risks  and complications were explained to the patient and were aknowledged.

## 2022-01-15 NOTE — Assessment & Plan Note (Signed)
I removed R earwax manually

## 2022-01-15 NOTE — Progress Notes (Signed)
Subjective:  Patient ID: Aaron [name], male    DOB: 1936/01/15  Age: 86 y.o. MRN: [id number]  CC: Follow-up (6 month f/u)   HPI Aaron [name] presents for ear wax, CAD, HTN, anxiety  Outpatient Medications Prior to Visit  Medication Sig Dispense Refill   apalutamide (ERLEADA) 60 MG tablet Take 240 mg by mouth as directed. Take 4 tablet at bedtime May be taken with or without food. Swallow tablets whole.     aspirin 81 MG EC tablet Take 81 mg by mouth daily.     CHOLECALCIFEROL PO Take 600 Units by mouth daily.     clonazePAM (KLONOPIN) 1 MG tablet TAKE 1/2 TO 1 TABLET BY MOUTH  TWICE DAILY AS NEEDED FOR  ANXIETY 180 tablet 1   Dextromethorphan-guaiFENesin 20-400 MG TABS Take 1 tablet by mouth daily as needed.      finasteride (PROSCAR) 5 MG tablet TAKE 1 TABLET BY MOUTH  DAILY 90 tablet 3   latanoprost (XALATAN) 0.005 % ophthalmic solution Place 1 drop into the left eye at bedtime. Place 1 drop into the left eye at bedtime     levothyroxine (SYNTHROID) 125 MCG tablet TAKE 1 TABLET BY MOUTH  DAILY Annual appt due in Sept must see provider for future refills 90 tablet 0   metoprolol succinate (TOPROL-XL) 25 MG 24 hr tablet TAKE 1/2 ALTERNATING WITH 1/4 OF A TABLET BY  MOUTH DAILY 45 tablet 4   omeprazole (PRILOSEC) 40 MG capsule Take 1 capsule (40 mg total) by mouth daily. 90 capsule 3   Relugolix (ORGOVYX) 120 MG TABS 1 po qd (Patient taking differently: 2 pills in AM) 90 tablet 3   simvastatin (ZOCOR) 20 MG tablet TAKE 1 TABLET BY MOUTH  DAILY 90 tablet 1   No facility-administered medications prior to visit.    ROS: Review of Systems  Constitutional:  Positive for fatigue. Negative for appetite change and unexpected weight change.  HENT:  Negative for congestion, nosebleeds, sneezing, sore throat and trouble swallowing.   Eyes:  Negative for itching and visual disturbance.  Respiratory:  Negative for cough.   Cardiovascular:  Negative for chest pain, palpitations and leg  swelling.  Gastrointestinal:  Negative for abdominal distention, blood in stool, diarrhea and nausea.  Genitourinary:  Negative for frequency and hematuria.  Musculoskeletal:  Positive for arthralgias and gait problem. Negative for back pain, joint swelling and neck pain.  Skin:  Negative for rash.  Neurological:  Negative for dizziness, tremors, speech difficulty and weakness.  Psychiatric/Behavioral:  Negative for agitation, dysphoric mood, sleep disturbance and suicidal ideas. The patient is not nervous/anxious.     Objective:  BP 118/68 (BP Location: Left Arm)   Pulse 73   Temp 97.6 F (36.4 C) (Oral)   Ht '6\' 3"'$  (1.905 m)   Wt 194 lb 9.6 oz (88.3 kg)   SpO2 96%   BMI 24.32 kg/m   BP Readings from Last 3 Encounters:  01/15/22 118/68  08/09/21 110/62  07/17/21 (!) 112/58    Wt Readings from Last 3 Encounters:  01/15/22 194 lb 9.6 oz (88.3 kg)  08/09/21 193 lb (87.5 kg)  07/17/21 196 lb (88.9 kg)    Physical Exam Constitutional:      General: He is not in acute distress.    Appearance: He is well-developed. He is obese.     Comments: NAD  Eyes:     Conjunctiva/sclera: Conjunctivae normal.     Pupils: Pupils are equal, round, and reactive to light.  Neck:     Thyroid: No thyromegaly.     Vascular: No JVD.  Cardiovascular:     Rate and Rhythm: Normal rate and regular rhythm.     Heart sounds: Normal heart sounds. No murmur heard.    No friction rub. No gallop.  Pulmonary:     Effort: Pulmonary effort is normal. No respiratory distress.     Breath sounds: Normal breath sounds. No wheezing or rales.  Chest:     Chest wall: No tenderness.  Abdominal:     General: Bowel sounds are normal. There is no distension.     Palpations: Abdomen is soft. There is no mass.     Tenderness: There is no abdominal tenderness. There is no guarding or rebound.  Musculoskeletal:        General: No tenderness. Normal range of motion.     Cervical back: Normal range of motion.   Lymphadenopathy:     Cervical: No cervical adenopathy.  Skin:    General: Skin is warm and dry.     Findings: No rash.  Neurological:     Mental Status: He is alert and oriented to person, place, and time.     Cranial Nerves: No cranial nerve deficit.     Motor: No abnormal muscle tone.     Coordination: Coordination normal.     Gait: Gait abnormal.     Deep Tendon Reflexes: Reflexes are normal and symmetric.  Psychiatric:        Behavior: Behavior normal.        Thought Content: Thought content normal.        Judgment: Judgment normal.   Wax R ear Using a cane  Lab Results  Component Value Date   WBC 4.7 10/08/2019   HGB 14.2 10/08/2019   HCT 43.4 10/08/2019   PLT 167 10/08/2019   GLUCOSE 98 12/14/2020   CHOL 180 10/08/2019   TRIG 140 10/08/2019   HDL 64 10/08/2019   LDLCALC 92 10/08/2019   ALT 12 12/14/2020   AST 24 12/14/2020   NA 135 12/14/2020   K 3.8 12/14/2020   CL 100 12/14/2020   CREATININE 1.55 (H) 12/14/2020   BUN 22 12/14/2020   CO2 29 12/14/2020   TSH 1.42 12/14/2020   PSA 10.87 (H) 04/20/2020   INR 1.19 04/28/2009   HGBA1C 7.1 (H) 12/14/2020    No results found.  Assessment & Plan:   Problem List Items Addressed This Visit     Anxiety    On Clonazepam prn  Potential benefits of a long term benzodiazepines  use as well as potential risks  and complications were explained to the patient and were aknowledged.      CAD (coronary artery disease)     On Rx: continue on ASA, Simvastatin      Cerumen impaction    I removed R earwax manually      CRI (chronic renal insufficiency), stage 3 (moderate) (HCC)    Hydrate well      Dyslipidemia    Chronic Cont on Simvastatin  Monitor labs - lipids, LFTs      Hyperglycemia    Check A1c         No orders of the defined types were placed in this encounter.     Follow-up: Return in about 4 months (around 05/18/2022) for a follow-up visit.  Walker Kehr, MD

## 2022-01-15 NOTE — Assessment & Plan Note (Signed)
Check A1c. 

## 2022-01-18 ENCOUNTER — Other Ambulatory Visit: Payer: Self-pay | Admitting: Internal Medicine

## 2022-01-18 DIAGNOSIS — E039 Hypothyroidism, unspecified: Secondary | ICD-10-CM

## 2022-01-18 MED ORDER — LEVOTHYROXINE SODIUM 125 MCG PO TABS: ORAL_TABLET | ORAL | 3 refills | Status: DC

## 2022-03-08 ENCOUNTER — Telehealth: Payer: Self-pay | Admitting: Internal Medicine

## 2022-03-08 NOTE — Telephone Encounter (Signed)
Patient called and is requesting a call back from someone clinical, He said he accidentally let a medication run out and he wasn't sure which one it was, He thinks it was either CHOLECALCIFEROL PO  or Dextromethorphan-guaiFENesin 20-400 MG TABS . Call back is [phone]

## 2022-03-08 NOTE — Telephone Encounter (Signed)
Called pt verified the two med he states he does not have. Inform pt the Cholecalciferol is actually her Vitamin D which is can get over counter and the cough syrup ( Robitussin cough syrup) Pt states ok he will get them over the counter...Johny Chess

## 2022-04-04 DIAGNOSIS — C61 Malignant neoplasm of prostate: Secondary | ICD-10-CM | POA: Diagnosis not present

## 2022-04-11 DIAGNOSIS — C778 Secondary and unspecified malignant neoplasm of lymph nodes of multiple regions: Secondary | ICD-10-CM | POA: Diagnosis not present

## 2022-04-11 DIAGNOSIS — C61 Malignant neoplasm of prostate: Secondary | ICD-10-CM | POA: Diagnosis not present

## 2022-05-01 DIAGNOSIS — H40022 Open angle with borderline findings, high risk, left eye: Secondary | ICD-10-CM | POA: Diagnosis not present

## 2022-05-01 DIAGNOSIS — H401211 Low-tension glaucoma, right eye, mild stage: Secondary | ICD-10-CM | POA: Diagnosis not present

## 2022-05-01 DIAGNOSIS — H04123 Dry eye syndrome of bilateral lacrimal glands: Secondary | ICD-10-CM | POA: Diagnosis not present

## 2022-05-01 DIAGNOSIS — H25813 Combined forms of age-related cataract, bilateral: Secondary | ICD-10-CM | POA: Diagnosis not present

## 2022-05-17 ENCOUNTER — Other Ambulatory Visit: Payer: Self-pay | Admitting: Cardiovascular Disease

## 2022-05-21 ENCOUNTER — Encounter: Payer: Self-pay | Admitting: Internal Medicine

## 2022-05-21 ENCOUNTER — Ambulatory Visit (INDEPENDENT_AMBULATORY_CARE_PROVIDER_SITE_OTHER): Payer: Medicare Other | Admitting: Internal Medicine

## 2022-05-21 VITALS — BP 110/60 | HR 84 | Temp 98.6°F | Ht 75.0 in | Wt 198.0 lb

## 2022-05-21 DIAGNOSIS — E039 Hypothyroidism, unspecified: Secondary | ICD-10-CM | POA: Diagnosis not present

## 2022-05-21 DIAGNOSIS — I2510 Atherosclerotic heart disease of native coronary artery without angina pectoris: Secondary | ICD-10-CM | POA: Diagnosis not present

## 2022-05-21 DIAGNOSIS — C155 Malignant neoplasm of lower third of esophagus: Secondary | ICD-10-CM

## 2022-05-21 DIAGNOSIS — R413 Other amnesia: Secondary | ICD-10-CM | POA: Diagnosis not present

## 2022-05-21 DIAGNOSIS — R29898 Other symptoms and signs involving the musculoskeletal system: Secondary | ICD-10-CM | POA: Diagnosis not present

## 2022-05-21 LAB — T3, FREE: T3, Free: 2.5 pg/mL (ref 2.3–4.2)

## 2022-05-21 LAB — TSH: TSH: 102.98 u[IU]/mL — ABNORMAL HIGH (ref 0.35–5.50)

## 2022-05-21 LAB — T4, FREE: Free T4: 0.66 ng/dL (ref 0.60–1.60)

## 2022-05-21 MED ORDER — SIMVASTATIN 20 MG PO TABS: 10.0000 mg | ORAL_TABLET | Freq: Every day | ORAL | 3 refills | Status: DC

## 2022-05-21 NOTE — Assessment & Plan Note (Signed)
Reduce Simvastatin to 1/2 tab qod due to weak legs Cont other meds

## 2022-05-21 NOTE — Progress Notes (Signed)
Subjective:  Patient ID: Aaron [name], male    DOB: November 06, 1935  Age: 87 y.o. MRN: [id number]  CC: Follow-up (4 month f/u)   HPI Aaron [name] presents for memory problems, HTH, dyslipidemia, cancer C/o leg weakness  Outpatient Medications Prior to Visit  Medication Sig Dispense Refill   apalutamide (ERLEADA) 60 MG tablet Take 240 mg by mouth as directed. Take 4 tablet at bedtime May be taken with or without food. Swallow tablets whole.     aspirin 81 MG EC tablet Take 81 mg by mouth daily.     CHOLECALCIFEROL PO Take 600 Units by mouth daily.     clonazePAM (KLONOPIN) 1 MG tablet TAKE 1/2 TO 1 TABLET BY MOUTH  TWICE DAILY AS NEEDED FOR  ANXIETY 180 tablet 1   Dextromethorphan-guaiFENesin 20-400 MG TABS Take 1 tablet by mouth daily as needed.      finasteride (PROSCAR) 5 MG tablet TAKE 1 TABLET BY MOUTH  DAILY 90 tablet 3   latanoprost (XALATAN) 0.005 % ophthalmic solution Place 1 drop into the left eye at bedtime. Place 1 drop into the left eye at bedtime     levothyroxine (SYNTHROID) 125 MCG tablet TAKE 1 TABLET BY MOUTH  DAILY 90 tablet 3   metoprolol succinate (TOPROL-XL) 25 MG 24 hr tablet TAKE ONE-HALF TABLET BY MOUTH  ALTERNATING WITH 1/4 OF A TABLET BY MOUTH DAILY 34 tablet 2   omeprazole (PRILOSEC) 40 MG capsule Take 1 capsule (40 mg total) by mouth daily. 90 capsule 3   Relugolix (ORGOVYX) 120 MG TABS 1 po qd (Patient taking differently: 2 pills in AM) 90 tablet 3   simvastatin (ZOCOR) 20 MG tablet TAKE 1 TABLET BY MOUTH DAILY 90 tablet 3   No facility-administered medications prior to visit.    ROS: Review of Systems  Constitutional:  Negative for appetite change, fatigue and unexpected weight change.  HENT:  Negative for congestion, nosebleeds, sneezing, sore throat and trouble swallowing.   Eyes:  Negative for itching and visual disturbance.  Respiratory:  Negative for cough.   Cardiovascular:  Negative for chest pain, palpitations and leg swelling.   Gastrointestinal:  Negative for abdominal distention, blood in stool, diarrhea and nausea.  Genitourinary:  Negative for frequency and hematuria.  Musculoskeletal:  Positive for arthralgias and gait problem. Negative for back pain, joint swelling and neck pain.  Skin:  Negative for rash.  Neurological:  Negative for dizziness, tremors, speech difficulty and weakness.  Psychiatric/Behavioral:  Positive for decreased concentration. Negative for agitation, behavioral problems, dysphoric mood and sleep disturbance. The patient is nervous/anxious.     Objective:  BP 110/60 (BP Location: Left Arm, Patient Position: Sitting, Cuff Size: Normal)   Pulse 84   Temp 98.6 F (37 C) (Oral)   Ht '6\' 3"'$  (1.905 m)   Wt 198 lb (89.8 kg)   SpO2 94%   BMI 24.75 kg/m   BP Readings from Last 3 Encounters:  05/21/22 110/60  01/15/22 118/68  08/09/21 110/62    Wt Readings from Last 3 Encounters:  05/21/22 198 lb (89.8 kg)  01/15/22 194 lb 9.6 oz (88.3 kg)  08/09/21 193 lb (87.5 kg)    Physical Exam Constitutional:      General: He is not in acute distress.    Appearance: Normal appearance. He is well-developed. He is obese.     Comments: NAD  Eyes:     Conjunctiva/sclera: Conjunctivae normal.     Pupils: Pupils are equal, round, and reactive to light.  Neck:     Thyroid: No thyromegaly.     Vascular: No JVD.  Cardiovascular:     Rate and Rhythm: Normal rate and regular rhythm.     Heart sounds: Normal heart sounds. No murmur heard.    No friction rub. No gallop.  Pulmonary:     Effort: Pulmonary effort is normal. No respiratory distress.     Breath sounds: Normal breath sounds. No wheezing or rales.  Chest:     Chest wall: No tenderness.  Abdominal:     General: Bowel sounds are normal. There is no distension.     Palpations: Abdomen is soft. There is no mass.     Tenderness: There is no abdominal tenderness. There is no guarding or rebound.  Musculoskeletal:        General: No  tenderness. Normal range of motion.     Cervical back: Normal range of motion.  Lymphadenopathy:     Cervical: No cervical adenopathy.  Skin:    General: Skin is warm and dry.     Findings: No rash.  Neurological:     Mental Status: He is alert and oriented to person, place, and time.     Cranial Nerves: No cranial nerve deficit.     Motor: No abnormal muscle tone.     Coordination: Coordination normal.     Gait: Gait abnormal.     Deep Tendon Reflexes: Reflexes are normal and symmetric.  Psychiatric:        Behavior: Behavior normal.        Thought Content: Thought content normal.        Judgment: Judgment normal.   Using a cane  Lab Results  Component Value Date   WBC 5.2 01/15/2022   HGB 10.9 (L) 01/15/2022   HCT 32.8 (L) 01/15/2022   PLT 193.0 01/15/2022   GLUCOSE 108 (H) 01/15/2022   CHOL 180 10/08/2019   TRIG 140 10/08/2019   HDL 64 10/08/2019   LDLCALC 92 10/08/2019   ALT 10 01/15/2022   AST 20 01/15/2022   NA 133 (L) 01/15/2022   K 4.3 01/15/2022   CL 97 01/15/2022   CREATININE 1.67 (H) 01/15/2022   BUN 25 (H) 01/15/2022   CO2 27 01/15/2022   TSH 120.78 Repeated and verified X2. (H) 01/15/2022   PSA 10.87 (H) 04/20/2020   INR 1.19 04/28/2009   HGBA1C 6.9 (H) 01/15/2022    No results found.  Assessment & Plan:   Problem List Items Addressed This Visit       Cardiovascular and Mediastinum   CAD (coronary artery disease)    Reduce Simvastatin to 1/2 tab qod due to weak legs Cont other meds      Relevant Medications   simvastatin (ZOCOR) 20 MG tablet     Digestive   Esophageal cancer (HCC)    F/u w/Dr Fuller Plan        Endocrine   Hypothyroidism    Check FT4 TSH        Other   Memory problem    Reduce Simvastatin to 1/2 tab qod      Leg weakness, bilateral - Primary    Worse Reduce Simvastatin to 1/2 tab qod         Meds ordered this encounter  Medications   simvastatin (ZOCOR) 20 MG tablet    Sig: Take 0.5 tablets (10 mg total)  by mouth daily.    Dispense:  90 tablet    Refill:  3    Please  send a replace/new response with 90-Day Supply if appropriate to maximize member benefit. Requesting 1 year supply.      Follow-up: Return in about 3 months (around 08/19/2022) for a follow-up visit.  Walker Kehr, MD

## 2022-05-21 NOTE — Assessment & Plan Note (Signed)
Reduce Simvastatin to 1/2 tab qod

## 2022-05-21 NOTE — Assessment & Plan Note (Signed)
F/u w/Dr Fuller Plan

## 2022-05-21 NOTE — Assessment & Plan Note (Signed)
Worse Reduce Simvastatin to 1/2 tab qod

## 2022-05-21 NOTE — Assessment & Plan Note (Signed)
Check FT4 TSH

## 2022-06-02 ENCOUNTER — Other Ambulatory Visit: Payer: Self-pay | Admitting: Internal Medicine

## 2022-07-12 ENCOUNTER — Ambulatory Visit: Payer: Medicare Other | Attending: Cardiovascular Disease | Admitting: Cardiovascular Disease

## 2022-07-12 ENCOUNTER — Encounter: Payer: Self-pay | Admitting: Cardiovascular Disease

## 2022-07-12 DIAGNOSIS — Z8501 Personal history of malignant neoplasm of esophagus: Secondary | ICD-10-CM | POA: Diagnosis not present

## 2022-07-12 DIAGNOSIS — N1832 Chronic kidney disease, stage 3b: Secondary | ICD-10-CM | POA: Diagnosis not present

## 2022-07-12 DIAGNOSIS — R7989 Other specified abnormal findings of blood chemistry: Secondary | ICD-10-CM | POA: Insufficient documentation

## 2022-07-12 DIAGNOSIS — I10 Essential (primary) hypertension: Secondary | ICD-10-CM | POA: Insufficient documentation

## 2022-07-12 DIAGNOSIS — E785 Hyperlipidemia, unspecified: Secondary | ICD-10-CM | POA: Diagnosis not present

## 2022-07-12 DIAGNOSIS — C61 Malignant neoplasm of prostate: Secondary | ICD-10-CM | POA: Insufficient documentation

## 2022-07-12 DIAGNOSIS — I2510 Atherosclerotic heart disease of native coronary artery without angina pectoris: Secondary | ICD-10-CM | POA: Insufficient documentation

## 2022-07-12 NOTE — Patient Instructions (Signed)
Medication Instructions:  No Changes *If you need a refill on your cardiac medications before your next appointment, please call your pharmacy*   Lab Work: Your physician recommends that you return for lab work in: 1 Week CBC CMET TSH, Free T4,T3 Lipid Panel   If you have labs (blood work) drawn today and your tests are completely normal, you will receive your results only by: MyChart Message (if you have MyChart) OR A paper copy in the mail If you have any lab test that is abnormal or we need to change your treatment, we will call you to review the results.   Testing/Procedures: None Ordered   Follow-Up: At [facility], you and your health needs are our priority.  As part of our continuing mission to provide you with exceptional heart care, we have created designated Provider Care Teams.  These Care Teams include your primary Cardiologist (physician) and Advanced Practice Providers (APPs -  Physician Assistants and Nurse Practitioners) who all work together to provide you with the care you need, when you need it.  We recommend signing up for the patient portal called "MyChart".  Sign up information is provided on this After Visit Summary.  MyChart is used to connect with patients for Virtual Visits (Telemedicine).  Patients are able to view lab/test results, encounter notes, upcoming appointments, etc.  Non-urgent messages can be sent to your provider as well.   To learn more about what you can do with MyChart, go to [email or web address].    Your next appointment:   6 month(s)  Provider:   Nicki Guadalajara, MD

## 2022-07-12 NOTE — Progress Notes (Signed)
Patient ID: Aaron [name], male   DOB: 1936-02-20, 87 y.o.   MRN: [id number]       HPI: Aaron [name] is a 87 y.o. male presents to the office today for a 15 month cardiology evaluation.  Aaron [name] has a history of mild CAD with 30% ostial LAD stenosis and 20% OM1 narrowing noted at cardiac catheterization in June 2010. Additional problems include hypertension with mild diastolic dysfunction, hyperlipidemia, and history of esophageal cancer, status post chemotherapy radiation therapy, history of hypothyroidism for which he is on thyroid replacement therapy. Over the past year, Aaron [name] has continued to do well. 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  He denies any change in exercise tolerance.  He denies chest pressure.  He denies palpitations.  He denies presyncope or syncope.  He denies edema.  He does have a history of diverticulosis and mild COPD.  He tells me he recently saw Dr. Russella Dar who felt that he was stable with reference to his remote esophageal CA and that there was no need to do any repeat endoscopy.   He underwent a 2-D echo Doppler study in October 2016 which showed an EF of 55-60%.  Mild to moderate aortic insufficiency, mild TR and trivial PR.  I saw him June 2018 at which time he was remaining stable.  I last saw him in September 2019.  He remained active and did not have complaints and was working 4 days/week, 6 hours/day [employer].He denies recurrent chest pain.  He continues to see Dr. Claudette Head in follow-up of his esophageal cancer and has had follow-up endoscopy with Dr. Russella Dar.    I saw him in September 2020 at which time he was no longer working since the COVID-19 pandemic.  He had noticed occasional episodes of palpitations as well as a slight increase in his resting heart rate.  He sees Dr. Posey Rea who has checked his laboratory.  He also was evaluated by Dr. Annabell Howells for his  prostate. He denies any chest pain.  He has been on simvastatin for hyperlipidemia.  He continues to be on levothyroxine 125 mcg for hypothyroidism.  He denies chest pain PND orthopnea, presyncope or syncope.  There is a history of diverticulosis and mild COPD.  During that evaluation, with his episodes of occasional increased heart rate I suggested the addition of low-dose metoprolol succinate initially at 12.5 mg to take at bedtime to be helpful to control his palpitations.  With hemoglobin A1c at 6.7 I recommended follow-up with Dr. Posey Rea for diabetes mellitus.  I saw him in March 2021 at which time he continued to feel well.  He noted significant improvement in palpitations since the initiation of Toprol-XL 12.5 mg.  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    He was evaluated by Azalee Course, PA in September 2021 and had some mild complaints of nonexertional chest pain.  He subsequently was referred for a nuclear perfusion study which was low risk and done on December 29, 2019 and suggested possible small prior infarct versus prominent diaphragmatic attenuation.  There was no ischemia.  EF was 52%.  When I saw him on June 15, 2020  he felt well and denied any recurrent chest pain.  At times he was experiencing very mild dizziness.  He denied  any syncope.  His blood pressure at home typically runs around 120 systolic.  He continues to be on metoprolol succinate 12.5 mg and denies any awareness of palpitations.  He is on simvastatin 20 mg for hyperlipidemia and levothyroxine 125 mcg for hypothyroidism.    I last saw him in April 20, 2021.  At that time he continued to feel well.    He was diagnosed with prostate cancer and has completed 40 radiation treatments, followed by Dr. Annabell Howells.  He is unaware of any palpitations and has been taking metoprolol succinate 12.5 mg daily.  He is on  simvastatin 20 mg daily for hyperlipidemia.  His blood pressure at home typically runs around 102-124 systolically.    Since I last saw him, he has remained stable.  He continues to be followed by Dr. Louretta [name] with his prostate CA.  He denies chest pain or shortness of breath.  He has had issues with decreased hearing.  He does note some fatigability.  He is unaware of palpitations.  He continues to be on Aaron [name] in addition to finasteride.  He is on levothyroxine for hypothyroidism.  He takes simvastatin 20 mg for hyperlipidemia and is on baby aspirin.  He takes clonazepam as needed for anxiety.  Apparently, he had laboratory in October 2023 which showed hemoglobin 10.9, hematocrit 32.8.  Creatinine was 1.67 consistent with stage IIIb CKD with estimated GFR at 37.5.  Hemoglobin A1c was 6.9.  TSH was low at 2.98 with free T4  at 0.66 and free T3 at 2.5.  He presents for evaluation.   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, now removed    Esophageal stricture    Gastritis    GERD (gastroesophageal reflux disease)    Hemorrhoids    Hiatal hernia    History of chemotherapy 05/03/09-06/16/09   Taxol/carboplatin   History of nuclear stress test 07/2008   exercise; mild-mod perfusion dfect in basal inf/mid inf/apical inf regions; EKG negative for ischemia   History of radiation therapy 05/03/09-06/16/09   squamous cell ca of esophagus   Hyperlipidemia    Dr Tresa Endo   Hypothyroid    s/p radiotherapy   Prostate cancer     Past Surgical History:  Procedure Laterality Date   CARDIAC CATHETERIZATION  08/2008   mild nonobstructive CAD with 30% LAD stenosis prox & 20% OM1 narrowing    COLONOSCOPY     CREATION OF CUTANEOUS STOMA   2011   ESOPHAGOGASTRODUODENOSCOPY  10/03/2011   Procedure: ESOPHAGOGASTRODUODENOSCOPY (EGD);  Surgeon: Meryl Dare, MD,FACG;  Location: [facility];  Service: Endoscopy;  Laterality: N/A;  no fluro needed   Lower Back Cyst Removed     [facility]   POLYPECTOMY     [name] DILATION  10/03/2011   Procedure: [name] DILATION;  Surgeon: Meryl Dare, MD,FACG;  Location: [facility];  Service: Endoscopy;  Laterality: N/A;   TONSILLECTOMY     age 45   TRANSTHORACIC ECHOCARDIOGRAM  06/2009   EF=>55%; trace MR/TR   UPPER GASTROINTESTINAL ENDOSCOPY      Allergies  Allergen Reactions   Amoxicillin     REACTION: rash  Cefuroxime Axetil     REACTION: nit feeling well   Fluconazole     REACTION: shaking   Escitalopram Other (See Comments)    Nightmares, numbness in tongue, dizziness    Current Outpatient Medications  Medication Sig Dispense Refill   apalutamide (ERLEADA) 60 MG tablet Take 240 mg by mouth as directed. Take 4 tablet at bedtime May be taken with or without food. Swallow tablets whole.     aspirin 81 MG EC tablet Take 81 mg by mouth daily.     CHOLECALCIFEROL PO Take 600 Units by mouth daily.     clonazePAM (KLONOPIN) 1 MG tablet TAKE 1/2 TO 1 TABLET BY MOUTH  TWICE DAILY AS NEEDED FOR  ANXIETY 180 tablet 1   Dextromethorphan-guaiFENesin 20-400 MG TABS Take 1 tablet by mouth daily as needed.      finasteride (PROSCAR) 5 MG tablet TAKE 1 TABLET BY MOUTH  DAILY 90 tablet 3   latanoprost (XALATAN) 0.005 % ophthalmic solution Place 1 drop into the left eye at bedtime. Place 1 drop into the left eye at bedtime     levothyroxine (SYNTHROID) 125 MCG tablet TAKE 1 TABLET BY MOUTH  DAILY 90 tablet 3   metoprolol succinate (TOPROL-XL) 25 MG 24 hr tablet TAKE ONE-HALF TABLET BY MOUTH  ALTERNATING WITH 1/4 OF A TABLET BY MOUTH DAILY 34 tablet 2   omeprazole (PRILOSEC) 40 MG capsule Take 1 capsule (40 mg total) by mouth daily. 90 capsule 3   Relugolix (Aaron [name]) 120 MG TABS 1 po qd  (Patient taking differently: 2 pills in AM) 90 tablet 3   simvastatin (ZOCOR) 20 MG tablet Take 0.5 tablets (10 mg total) by mouth daily. 90 tablet 3   No current facility-administered medications for this visit.    Social History   Socioeconomic History   Marital status: Married    Spouse name: Not on file   Number of children: 3   Years of education: 12th   Highest education level: Not on file  Occupational History   OccupatioKorean: Retired, [employer]    Employer: RETIRED    Comment: Supervisor  Tobacco Use   Smoking status: Former    Packs/day: 1.00    Years: 40.00    Additional pack years: 0.00    Total pack years: 40.00    Types: Cigarettes    Quit date: 01/16/2001    Years since quitting: 21.5   Smokeless tobacco: Never   Tobacco comments:    Quit 2002  Vaping Use   Vaping Use: Never used  Substance and Sexual Activity   Alcohol use: Yes    Alcohol/week: 0.0 standard drinks of alcohol    Comment: occasional drink 2-3 a month   Drug use: No    Comment: quit smoking 2002   Sexual activity: Yes  Other Topics Concern   Not on file  Social History Narrative   Daily Caffeine - 1      Social Determinants of Health   Financial Resource Strain: Low Risk  (09/07/2021)   Overall Financial Resource Strain (CARDIA)    Difficulty of Paying Living Expenses: Not hard at all  Food Insecurity: No Food Insecurity (09/07/2021)   Hunger Vital Sign    Worried About Running Out of Food in the Last Year: Never true    Ran Out of Food in the Last Year: Never true  Transportation Needs: No Transportation Needs (09/07/2021)   PRAPARE - Administrator, Civil Service (Medical): No  Lack of Transportation (Non-Medical): No  Physical Activity: Insufficiently Active (09/07/2021)   Exercise Vital Sign    Days of Exercise per Week: 2 days    Minutes of Exercise per Session: 20 min  Stress: No Stress Concern Present (09/07/2021)   [employer] of  Occupational Health - Occupational Stress Questionnaire    Feeling of Stress : Not at all  Social Connections: Socially Integrated (09/07/2021)   Social Connection and Isolation Panel [NHANES]    Frequency of Communication with Friends and Family: More than three times a week    Frequency of Social Gatherings with Friends and Family: Once a week    Attends Religious Services: More than 4 times per year    Active Member of [employer] or Organizations: Yes    Attends Banker Meetings: 1 to 4 times per year    Marital Status: Married  Catering manager Violence: Not At Risk (09/07/2021)   Humiliation, Afraid, Rape, and Kick questionnaire    Fear of Current or Ex-Partner: No    Emotionally Abused: No    Physically Abused: No    Sexually Abused: No    Family History  Problem Relation Age of Onset   Prostate cancer Father        38's   Diabetes Father    Stroke Mother        aneurism   Stroke Maternal Grandfather 35   Prostate cancer Brother 36   Colon polyps Brother    Prostate cancer Brother 63   Colon cancer Sister    Coronary artery disease Neg Hx    Hypertension Neg Hx    Stomach cancer Neg Hx    Rectal cancer Neg Hx     ROS General: Negative; No fevers, chills, or night sweats;  HEENT: Negative; No changes in vision or hearing, sinus congestion, difficulty swallowing Pulmonary: Negative; No cough, wheezing, shortness of breath, hemoptysis Cardiovascular: see HPI GI: Remote history of esophageal cancer, status post chemotherapy and radiation treatment GU: Prostate CVA; recently evaluated by Dr. Annabell Howells, status post 40 radiation treatments Musculoskeletal: Negative; no myalgias, joint pain, or weakness Hematologic/Oncology: Negative; no easy bruising, bleeding Endocrine: Positive for hypothyroidism on Synthroid replacement Neuro: Negative; no changes in balance, headaches Skin: Negative; No rashes or skin lesions Psychiatric: Negative; No behavioral problems,  depression Sleep: Negative; No snoring, daytime sleepiness, hypersomnolence, bruxism, restless legs, hypnogognic hallucinations, no cataplexy Other comprehensive 14 point system review is negative.  PE BP 130/82   Pulse 79   Ht 6\' 3"  (1.905 m)   Wt 197 lb 12.8 oz (89.7 kg)   SpO2 97%   BMI 24.72 kg/m    Repeat blood pressure by me was 122/80  Wt Readings from Last 3 Encounters:  07/12/22 197 lb 12.8 oz (89.7 kg)  05/21/22 198 lb (89.8 kg)  01/15/22 194 lb 9.6 oz (88.3 kg)   General: Alert, oriented, no distress.  Skin: normal turgor, no rashes, warm and dry HEENT: Normocephalic, atraumatic. Pupils equal round and reactive to light; sclera anicteric; extraocular muscles intact;  Nose without nasal septal hypertrophy Mouth/Parynx benign; Mallinpatti scale 3 Neck: No JVD, no carotid bruits; normal carotid upstroke Lungs: clear to ausculatation and percussion; no wheezing or rales Chest wall: without tenderness to palpitation Heart: PMI not displaced, RRR, s1 s2 normal, 1/6 systolic murmur, no diastolic murmur, no rubs, gallops, thrills, or heaves Abdomen: soft, nontender; no hepatosplenomehaly, BS+; abdominal aorta nontender and not dilated by palpation. Back: no CVA tenderness Pulses 2+ Musculoskeletal: full  range of motion, normal strength, no joint deformities Extremities: no clubbing cyanosis or edema, Homan's sign negative  Neurologic: grossly nonfocal; Cranial nerves grossly wnl Psychologic: Normal mood and affect    July 12, 2022 ECG (independently read by me): Normal sinus rhythm at 79 bpm.  March 31, 2021 ECG (independently read by me): NSR at 77, NST abnormality     June 15, 2020 ECG (independently read by me): Normal sinus rhythm at 79, no ectopy, normal intervals  June 15, 2020 ECG (independently read by me): NSR at 79, no ectopy; normal intervals  March 2021 ECG (independently read by me): Sinus rhythm at 81 bpm with an isolated PVC normal intervals     September 2020 ECG (independently read by me): Sinus rhythm at 95 bpm with occasional PVCs with right bundle morphology; normal intervals.  September 2019 ECG (independently read by me): Normal sinus rhythm at 76 bpm.  One isolated PVC.  No ST segment changes.  Normal intervals.  June 2019 ECG (independently read by me): Normal sinus rhythm at 79 bpm.  No ectopy.  No ST segment changes.  May 2017 ECG (independently read by me): Normal sinus rhythm at 76 bpm.  PR interval 176 ms.  QTc interval 411 milliseconds.  No ST segment changes.  September 2016 ECG (independently read by me): Normal sinus rhythm at 87 bpm.  No ectopy.  Normal intervals.  No ST segment changes.  12/17/2013 ECG (independently read by me): Normal sinus rhythm at 65 beats per minute.  Normal intervals.  No ST segment changes.  September 2014 ECG: Normal sinus rhythm at 60 beats per minute. Intervals are normal.  LABS:     Latest Ref Rng & Units 01/15/2022   11:33 AM 12/14/2020    2:02 PM 04/20/2020    2:05 PM  BMP  Glucose 70 - 99 mg/dL 161  98  096   BUN 6 - 23 mg/dL 25  22  14    Creatinine 0.40 - 1.50 mg/dL 0.45  4.09  8.11   Sodium 135 - 145 mEq/L 133  135  135   Potassium 3.5 - 5.1 mEq/L 4.3  3.8  4.0   Chloride 96 - 112 mEq/L 97  100  99   CO2 19 - 32 mEq/L 27  29  30    Calcium 8.4 - 10.5 mg/dL 91.4  78.2  95.6       Latest Ref Rng & Units 01/15/2022   11:33 AM 12/14/2020    2:02 PM 04/20/2020    2:05 PM  Hepatic Function  Total Protein 6.0 - 8.3 g/dL 8.5  8.5  8.1   Albumin 3.5 - 5.2 g/dL 4.4  4.4  4.5   AST 0 - 37 U/L 20  24  25    ALT 0 - 53 U/L 10  12  13    Alk Phosphatase 39 - 117 U/L 66  81  68   Total Bilirubin 0.2 - 1.2 mg/dL 0.4  0.4  0.5       Latest Ref Rng & Units 01/15/2022   11:33 AM 10/08/2019   11:19 AM 10/09/2018   10:32 AM  CBC  WBC 4.0 - 10.5 K/uL 5.2  4.7  4.0   Hemoglobin 13.0 - 17.0 g/dL 21.3  08.6  57.8   Hematocrit 39.0 - 52.0 % 32.8  43.4  44.1   Platelets 150.0 - 400.0  K/uL 193.0  167  170.0    Lab Results  Component Value Date   MCV  96.3 01/15/2022   MCV 89.7 10/08/2019   MCV 91.4 10/09/2018   Lab Results  Component Value Date   TSH 102.98 (H) 05/21/2022   Lab Results  Component Value Date   HGBA1C 6.9 (H) 01/15/2022   Lipid Panel     Component Value Date/Time   CHOL 180 10/08/2019 1119   CHOL 150 12/05/2017 0942   TRIG 140 10/08/2019 1119   TRIG 120 02/26/2006 1420   HDL 64 10/08/2019 1119   HDL 57 12/05/2017 0942   CHOLHDL 2.8 10/08/2019 1119   VLDL 26.0 10/09/2018 1032   LDLCALC 92 10/08/2019 1119   IMPRESSION:  1. CAD in native artery: Cath June 2010   2. Hyperlipidemia LDL goal <70   3. Essential hypertension   4. Stage 3b chronic kidney disease   5. Elevated TSH   6. Prostate CA [facility]): Status post 40 radiation treatments   7. History of esophageal cancer     ASSESSMENT AND PLAN: Aaron [name] is an active 87 year-old African-American male who has documented mild CAD by cardiac catheterization in June 2010 and has remained stable on medical therapy.  He continues to be without recurrent anginal symptomatology and remains relatively active active working in his yard.  Due to palpitations, he had been started with metoprolol succinate 12.5 mg and has continued to do exceptionally well on this therapy with resolution of prior symptomatology.  He had developed nonexertional somewhat atypical chest discomfort and was seen by Azalee Course, PA in September 2021.  A Lexiscan Myoview study was low risk and did not show any ischemia.  When I saw him in March 2022 he was experiencing rare episodes of mild dizziness.  I recommended he monitor his blood pressure and heart rate.  I recommended that he can lower his metoprolol dose to 6.25 mg alternating with 12.5 mg every other day depending upon symptomatology.  When seen in January 2023 his pulse was 77 and blood pressure was stable.  His pulse today is 77.  Blood pressure is stable.  He completed 40  radiation treatments for his prostate CA, followed by Dr. Annabell Howells.  Presently, he remains asymptomatic from a cardiac perspective.  There is no chest pain or shortness of breath.  He does admit to fatigability.  I reviewed laboratory from October 2023 which showed stage IIIb CKD with creatinine 1.67.  TSH was significantly increased at 102.98 with normal free T4 and free T3 levels.  I have recommended repeat fasting laboratory with a comprehensive metabolic panel, CBC, TSH, free T3, free T4, and fasting lipid studies.  He sees Dr. Posey Rea for his primary care.  He will be following up with Dr. Kennedy Bucker for his prostate CA.  As long as he is stable I will see him in 6 months for Cardiolite lab at vascular reevaluation    Lennette Bihari, MD, [facility]  07/19/2022 9:18 AM

## 2022-07-19 ENCOUNTER — Encounter: Payer: Self-pay | Admitting: Cardiovascular Disease

## 2022-07-19 DIAGNOSIS — E785 Hyperlipidemia, unspecified: Secondary | ICD-10-CM | POA: Diagnosis not present

## 2022-07-19 DIAGNOSIS — R7989 Other specified abnormal findings of blood chemistry: Secondary | ICD-10-CM | POA: Diagnosis not present

## 2022-07-27 LAB — TSH+T4F+T3FREE
Free T4: 0.7 ng/dL — ABNORMAL LOW (ref 0.82–1.77)
T3, Free: 1.5 pg/mL — ABNORMAL LOW (ref 2.0–4.4)
TSH: 173 u[IU]/mL — ABNORMAL HIGH (ref 0.450–4.500)

## 2022-07-27 LAB — CBC
Hematocrit: 36.2 % — ABNORMAL LOW (ref 37.5–51.0)
Hemoglobin: 11.7 g/dL — ABNORMAL LOW (ref 13.0–17.7)
MCH: 30.4 pg (ref 26.6–33.0)
MCHC: 32.3 g/dL (ref 31.5–35.7)
MCV: 94 fL (ref 79–97)
Platelets: 231 10*3/uL (ref 150–450)
RBC: 3.85 x10E6/uL — ABNORMAL LOW (ref 4.14–5.80)
RDW: 13.6 % (ref 11.6–15.4)
WBC: 3.6 10*3/uL (ref 3.4–10.8)

## 2022-07-27 LAB — COMPREHENSIVE METABOLIC PANEL
ALT: 6 IU/L (ref 0–44)
AST: 17 IU/L (ref 0–40)
Albumin/Globulin Ratio: 1.4 (ref 1.2–2.2)
Albumin: 4.4 g/dL (ref 3.7–4.7)
Alkaline Phosphatase: 79 IU/L (ref 44–121)
BUN/Creatinine Ratio: 12 (ref 10–24)
BUN: 21 mg/dL (ref 8–27)
Bilirubin Total: 0.2 mg/dL (ref 0.0–1.2)
CO2: 25 mmol/L (ref 20–29)
Calcium: 10.8 mg/dL — ABNORMAL HIGH (ref 8.6–10.2)
Chloride: 94 mmol/L — ABNORMAL LOW (ref 96–106)
Creatinine, Ser: 1.71 mg/dL — ABNORMAL HIGH (ref 0.76–1.27)
Globulin, Total: 3.1 g/dL (ref 1.5–4.5)
Glucose: 130 mg/dL — ABNORMAL HIGH (ref 70–99)
Potassium: 4.3 mmol/L (ref 3.5–5.2)
Sodium: 134 mmol/L (ref 134–144)
Total Protein: 7.5 g/dL (ref 6.0–8.5)
eGFR: 39 mL/min/{1.73_m2} — ABNORMAL LOW (ref >59)

## 2022-07-27 LAB — LIPID PANEL
Chol/HDL Ratio: 3.9 ratio (ref 0.0–5.0)
Cholesterol, Total: 219 mg/dL — ABNORMAL HIGH (ref 100–199)
HDL: 56 mg/dL (ref >39)
LDL Chol Calc (NIH): 138 mg/dL — ABNORMAL HIGH (ref 0–99)
Triglycerides: 140 mg/dL (ref 0–149)
VLDL Cholesterol Cal: 25 mg/dL (ref 5–40)

## 2022-07-30 ENCOUNTER — Telehealth: Payer: Self-pay | Admitting: Cardiovascular Disease

## 2022-07-30 NOTE — Telephone Encounter (Signed)
Please verify if cardiology making changes to synthroid or PCP to follow . Thank you

## 2022-07-30 NOTE — Telephone Encounter (Signed)
Send lab back to PCP for treatment management

## 2022-07-30 NOTE — Telephone Encounter (Signed)
New Message:     Patient's wife called. She said the last time patient was seen, his thyroid level was down. She wanted to know if Dr Tresa Endo called in a higher dosage for him?

## 2022-07-31 NOTE — Telephone Encounter (Signed)
Spoke with wife and advised to speak with PCP regarding this.  She will speak with them at his appt. Later this month

## 2022-08-06 ENCOUNTER — Other Ambulatory Visit: Payer: Self-pay | Admitting: Gastroenterology

## 2022-08-09 ENCOUNTER — Ambulatory Visit (INDEPENDENT_AMBULATORY_CARE_PROVIDER_SITE_OTHER): Payer: Medicare Other | Admitting: Internal Medicine

## 2022-08-09 ENCOUNTER — Encounter: Payer: Self-pay | Admitting: Internal Medicine

## 2022-08-09 VITALS — BP 102/68 | HR 83 | Temp 98.2°F | Ht 75.0 in | Wt 193.0 lb

## 2022-08-09 DIAGNOSIS — H9190 Unspecified hearing loss, unspecified ear: Secondary | ICD-10-CM | POA: Insufficient documentation

## 2022-08-09 DIAGNOSIS — H6123 Impacted cerumen, bilateral: Secondary | ICD-10-CM

## 2022-08-09 DIAGNOSIS — E039 Hypothyroidism, unspecified: Secondary | ICD-10-CM | POA: Diagnosis not present

## 2022-08-09 DIAGNOSIS — D500 Iron deficiency anemia secondary to blood loss (chronic): Secondary | ICD-10-CM | POA: Diagnosis not present

## 2022-08-09 DIAGNOSIS — R413 Other amnesia: Secondary | ICD-10-CM

## 2022-08-09 DIAGNOSIS — H9193 Unspecified hearing loss, bilateral: Secondary | ICD-10-CM

## 2022-08-09 MED ORDER — LEVOTHYROXINE SODIUM 150 MCG PO TABS
150.0000 ug | ORAL_TABLET | Freq: Every day | ORAL | 3 refills | Status: DC
Start: 2022-08-09 — End: 2022-09-11

## 2022-08-09 NOTE — Assessment & Plan Note (Addendum)
Worse.  Low FT4 Increase Levothyroxin dose 

## 2022-08-09 NOTE — Assessment & Plan Note (Addendum)
FGet a hearing test at [facility]

## 2022-08-09 NOTE — Assessment & Plan Note (Signed)
Check CBC 

## 2022-08-09 NOTE — Assessment & Plan Note (Signed)
Worse.  Low FT4 Increase Levothyroxin dose

## 2022-08-09 NOTE — Progress Notes (Signed)
Subjective:  Patient ID: Aaron [name], male    DOB: November 09, 1935  Age: 87 y.o. MRN: [id number]  CC: No chief complaint on file.   HPI Aaron [name] presents for DOE, fatigue, hot lashes - not new  Outpatient Medications Prior to Visit  Medication Sig Dispense Refill   apalutamide (ERLEADA) 60 MG tablet Take 240 mg by mouth as directed. Take 4 tablet at bedtime May be taken with or without food. Swallow tablets whole.     aspirin 81 MG EC tablet Take 81 mg by mouth daily.     CHOLECALCIFEROL PO Take 600 Units by mouth daily.     clonazePAM (KLONOPIN) 1 MG tablet TAKE 1/2 TO 1 TABLET BY MOUTH  TWICE DAILY AS NEEDED FOR  ANXIETY 180 tablet 1   Dextromethorphan-guaiFENesin 20-400 MG TABS Take 1 tablet by mouth daily as needed.      finasteride (PROSCAR) 5 MG tablet TAKE 1 TABLET BY MOUTH  DAILY 90 tablet 3   latanoprost (XALATAN) 0.005 % ophthalmic solution Place 1 drop into the left eye at bedtime. Place 1 drop into the left eye at bedtime     metoprolol succinate (TOPROL-XL) 25 MG 24 hr tablet TAKE ONE-HALF TABLET BY MOUTH  ALTERNATING WITH 1/4 OF A TABLET BY MOUTH DAILY 34 tablet 2   omeprazole (PRILOSEC) 40 MG capsule TAKE 1 CAPSULE BY MOUTH DAILY 90 capsule 0   Relugolix (ORGOVYX) 120 MG TABS 1 po qd (Patient taking differently: 2 pills in AM) 90 tablet 3   simvastatin (ZOCOR) 20 MG tablet Take 0.5 tablets (10 mg total) by mouth daily. 90 tablet 3   levothyroxine (SYNTHROID) 125 MCG tablet TAKE 1 TABLET BY MOUTH  DAILY 90 tablet 3   No facility-administered medications prior to visit.    ROS: Review of Systems  Constitutional:  Positive for diaphoresis and fatigue. Negative for appetite change and unexpected weight change.  HENT:  Negative for congestion, nosebleeds, sneezing, sore throat and trouble swallowing.   Eyes:  Negative for itching and visual disturbance.  Respiratory:  Positive for shortness of breath. Negative for cough.   Cardiovascular:  Negative for chest pain,  palpitations and leg swelling.  Gastrointestinal:  Negative for abdominal distention, blood in stool, diarrhea and nausea.  Genitourinary:  Negative for frequency and hematuria.  Musculoskeletal:  Negative for back pain, gait problem, joint swelling and neck pain.  Skin:  Negative for rash.  Neurological:  Positive for weakness. Negative for dizziness, tremors and speech difficulty.  Psychiatric/Behavioral:  Negative for agitation, dysphoric mood and sleep disturbance. The patient is not nervous/anxious.     Objective:  BP 102/68 (BP Location: Left Arm, Patient Position: Sitting, Cuff Size: Large)   Pulse 83   Temp 98.2 F (36.8 C) (Oral)   Ht 6\' 3"  (1.905 m)   Wt 193 lb (87.5 kg)   SpO2 95%   BMI 24.12 kg/m   BP Readings from Last 3 Encounters:  08/09/22 102/68  07/12/22 130/82  05/21/22 110/60    Wt Readings from Last 3 Encounters:  08/09/22 193 lb (87.5 kg)  07/12/22 197 lb 12.8 oz (89.7 kg)  05/21/22 198 lb (89.8 kg)    Physical Exam Constitutional:      General: He is not in acute distress.    Appearance: Normal appearance. He is well-developed.     Comments: NAD  Eyes:     Conjunctiva/sclera: Conjunctivae normal.     Pupils: Pupils are equal, round, and reactive to light.  Neck:  Thyroid: No thyromegaly.     Vascular: No JVD.  Cardiovascular:     Rate and Rhythm: Normal rate and regular rhythm.     Heart sounds: Normal heart sounds. No murmur heard.    No friction rub. No gallop.  Pulmonary:     Effort: Pulmonary effort is normal. No respiratory distress.     Breath sounds: Normal breath sounds. No wheezing or rales.  Chest:     Chest wall: No tenderness.  Abdominal:     General: Bowel sounds are normal. There is no distension.     Palpations: Abdomen is soft. There is no mass.     Tenderness: There is no abdominal tenderness. There is no guarding or rebound.  Musculoskeletal:        General: No tenderness. Normal range of motion.     Cervical back:  Normal range of motion.  Lymphadenopathy:     Cervical: No cervical adenopathy.  Skin:    General: Skin is warm and dry.     Findings: No rash.  Neurological:     Mental Status: He is alert and oriented to person, place, and time.     Cranial Nerves: No cranial nerve deficit.     Motor: No abnormal muscle tone.     Coordination: Coordination normal.     Gait: Gait normal.     Deep Tendon Reflexes: Reflexes are normal and symmetric.  Psychiatric:        Behavior: Behavior normal.        Thought Content: Thought content normal.        Judgment: Judgment normal.   Using a cane Wax B - cleaned out w a loop  Lab Results  Component Value Date   WBC 3.6 07/19/2022   HGB 11.7 (L) 07/19/2022   HCT 36.2 (L) 07/19/2022   PLT 231 07/19/2022   GLUCOSE 130 (H) 07/19/2022   CHOL 219 (H) 07/19/2022   TRIG 140 07/19/2022   HDL 56 07/19/2022   LDLCALC 138 (H) 07/19/2022   ALT 6 07/19/2022   AST 17 07/19/2022   NA 134 07/19/2022   K 4.3 07/19/2022   CL 94 (L) 07/19/2022   CREATININE 1.71 (H) 07/19/2022   BUN 21 07/19/2022   CO2 25 07/19/2022   TSH 173.000 (H) 07/19/2022   PSA 10.87 (H) 04/20/2020   INR 1.19 04/28/2009   HGBA1C 6.9 (H) 01/15/2022    No results found.  Assessment & Plan:   Problem List Items Addressed This Visit     Iron deficiency anemia - Primary    Check CBC      Hypothyroidism    Worse.  Low FT4 Increase Levothyroxin dose      Relevant Medications   levothyroxine (SYNTHROID) 150 MCG tablet   Other Relevant Orders   Comprehensive metabolic panel   TSH   T4, free   Cerumen impaction    B wax - removed w/a loop      Memory problem    Worse.  Low FT4 Increase Levothyroxin dose      Relevant Orders   Comprehensive metabolic panel   TSH   T4, free   Hearing loss    [facility]         Meds ordered this encounter  Medications   levothyroxine (SYNTHROID) 150 MCG tablet    Sig: Take 1 tablet (150 mcg total) by mouth  daily.    Dispense:  90 tablet    Refill:  3  Follow-up: Return in about 6 weeks (around 09/20/2022) for a follow-up visit.  Sonda Primes, MD

## 2022-08-09 NOTE — Assessment & Plan Note (Signed)
B wax - removed w/a loop

## 2022-08-10 DIAGNOSIS — C61 Malignant neoplasm of prostate: Secondary | ICD-10-CM | POA: Diagnosis not present

## 2022-08-15 DIAGNOSIS — C778 Secondary and unspecified malignant neoplasm of lymph nodes of multiple regions: Secondary | ICD-10-CM | POA: Diagnosis not present

## 2022-08-15 DIAGNOSIS — C61 Malignant neoplasm of prostate: Secondary | ICD-10-CM | POA: Diagnosis not present

## 2022-09-10 ENCOUNTER — Ambulatory Visit (INDEPENDENT_AMBULATORY_CARE_PROVIDER_SITE_OTHER): Payer: Medicare Other

## 2022-09-10 VITALS — Ht 75.0 in | Wt 200.0 lb

## 2022-09-10 DIAGNOSIS — Z0000 Encounter for general adult medical examination without abnormal findings: Secondary | ICD-10-CM

## 2022-09-10 NOTE — Progress Notes (Cosign Needed Addendum)
I connected with  [name] on 09/10/22 by a audio enabled telemedicine application and verified that I am speaking with the correct person using two identifiers.  Patient Location: Home  Provider Location: Office/Clinic  I discussed the limitations of evaluation and management by telemedicine. The patient expressed understanding and agreed to proceed.  Subjective:   [name] is a 87 y.o. male who presents for Medicare Annual/Subsequent preventive examination.  Review of Systems     Cardiac Risk Factors include: advanced age (>49men, >23 women);dyslipidemia;family history of premature cardiovascular disease;hypertension;male gender;sedentary lifestyle     Objective:    Today's Vitals   09/10/22 1548  Weight: 200 lb (90.7 kg)  Height: 6\' 3"  (1.905 m)  PainSc: 0-No pain   Body mass index is 25 kg/m.     09/10/2022    3:50 PM 09/07/2021    2:58 PM 04/14/2021    9:08 AM 11/15/2020    9:36 AM 08/12/2020   10:15 AM 07/24/2019   12:02 PM 05/03/2016   11:35 AM  Advanced Directives  Does Patient Have a Medical Advance Directive? Yes Yes Yes Yes Yes Yes No  Type of Estate agent of [place];Living will  Living will;Healthcare Power of Attorney Living will Living will;Healthcare Power of [employer] Power of [place];Living will   Does patient want to make changes to medical advance directive?     No - Patient declined No - Patient declined   Copy of Healthcare Power of Attorney in Chart? No - copy requested    No - copy requested No - copy requested     Current Medications (verified) Outpatient Encounter Medications as of 09/10/2022  Medication Sig   apalutamide (ERLEADA) 60 MG tablet Take 240 mg by mouth as directed. Take 4 tablet at bedtime May be taken with or without food. Swallow tablets whole.   aspirin 81 MG EC tablet Take 81 mg by mouth daily.   CHOLECALCIFEROL PO Take 600 Units by mouth daily.   clonazePAM (KLONOPIN) 1 MG tablet TAKE  1/2 TO 1 TABLET BY MOUTH  TWICE DAILY AS NEEDED FOR  ANXIETY   Dextromethorphan-guaiFENesin 20-400 MG TABS Take 1 tablet by mouth daily as needed.    finasteride (PROSCAR) 5 MG tablet TAKE 1 TABLET BY MOUTH  DAILY   latanoprost (XALATAN) 0.005 % ophthalmic solution Place 1 drop into the left eye at bedtime. Place 1 drop into the left eye at bedtime   levothyroxine (SYNTHROID) 150 MCG tablet Take 1 tablet (150 mcg total) by mouth daily.   metoprolol succinate (TOPROL-XL) 25 MG 24 hr tablet TAKE ONE-HALF TABLET BY MOUTH  ALTERNATING WITH 1/4 OF A TABLET BY MOUTH DAILY   omeprazole (PRILOSEC) 40 MG capsule TAKE 1 CAPSULE BY MOUTH DAILY   Relugolix (ORGOVYX) 120 MG TABS 1 po qd (Patient taking differently: 2 pills in AM)   simvastatin (ZOCOR) 20 MG tablet Take 0.5 tablets (10 mg total) by mouth daily.   No facility-administered encounter medications on file as of 09/10/2022.    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)    Past Surgical History:  Procedure Laterality Date   CARDIAC CATHETERIZATION  08/2008   mild nonobstructive  CAD with 30% LAD stenosis prox & 20% OM1 narrowing    COLONOSCOPY     CREATION OF CUTANEOUS STOMA  2011   ESOPHAGOGASTRODUODENOSCOPY  10/03/2011   Procedure: ESOPHAGOGASTRODUODENOSCOPY (EGD);  Surgeon: Meryl Dare, MD,FACG;  Location: [facility];  Service: Endoscopy;  Laterality: N/A;  no fluro needed   Lower Back Cyst Removed     [facility]   POLYPECTOMY     [name] DILATION  10/03/2011   Procedure: [name] DILATION;  Surgeon: Meryl Dare, MD,FACG;  Location: [facility];  Service: Endoscopy;  Laterality: N/A;   TONSILLECTOMY     age 78   TRANSTHORACIC ECHOCARDIOGRAM  06/2009   EF=>55%; trace MR/TR   UPPER GASTROINTESTINAL ENDOSCOPY     Family History  Problem Relation Age of Onset   Prostate cancer Father        64's   Diabetes Father    Stroke Mother        aneurism   Stroke Maternal Grandfather 70   Prostate cancer Brother 81   Colon polyps Brother    Prostate cancer Brother 73   Colon cancer Sister    Coronary artery disease Neg Hx    Hypertension Neg Hx    Stomach cancer Neg Hx    Rectal cancer Neg Hx    Social History   Socioeconomic History   Marital status: Married    Spouse name: Not on file   Number of children: 3   Years of education: 12th   Highest education level: Not on file  Occupational History   Occupation: Retired, [place] [employer]    Employer: RETIRED    Comment: Supervisor  Tobacco Use   Smoking status: Former    Packs/day: 1.00    Years: 40.00    Additional pack years: 0.00    Total pack years: 40.00    Types: Cigarettes    Quit date: 01/16/2001    Years since quitting: 21.6   Smokeless tobacco: Never   Tobacco comments:    Quit 2002  Vaping Use   Vaping Use: Never used  Substance and Sexual Activity   Alcohol use: Yes    Alcohol/week: 0.0 standard drinks of alcohol    Comment: occasional drink 2-3 a month   Drug use: No    Comment: quit smoking 2002   Sexual activity: Yes  Other Topics Concern   Not on file   Social History Narrative   Daily Caffeine - 1      Social Determinants of Health   Financial Resource Strain: Low Risk  (09/10/2022)   Overall Financial Resource Strain (CARDIA)    Difficulty of Paying Living Expenses: Not hard at all  Food Insecurity: No Food Insecurity (09/10/2022)   Hunger Vital Sign    Worried About Running Out of Food in the  Last Year: Never true    Ran Out of Food in the Last Year: Never true  Transportation Needs: No Transportation Needs (09/10/2022)   PRAPARE - Administrator, Civil Service (Medical): No    Lack of Transportation (Non-Medical): No  Physical Activity: Inactive (09/10/2022)   Exercise Vital Sign    Days of Exercise per Week: 0 days    Minutes of Exercise per Session: 0 min  Stress: No Stress Concern Present (09/10/2022)   [employer] of Occupational Health - Occupational Stress Questionnaire    Feeling of Stress : Not at all  Social Connections: Socially Integrated (09/10/2022)   Social Connection and Isolation Panel [NHANES]    Frequency of Communication with Friends and Family: More than three times a week    Frequency of Social Gatherings with Friends and Family: More than three times a week    Attends Religious Services: More than 4 times per year    Active Member of [employer] or Organizations: Yes    Attends Banker Meetings: 1 to 4 times per year    Marital Status: Married    Tobacco Counseling Counseling given: Not Answered Tobacco comments: Quit 2002   Clinical Intake:  Pre-visit preparation completed: Yes  Pain : No/denies pain Pain Score: 0-No pain     BMI - recorded: 25 Nutritional Status: BMI 25 -29 Overweight Nutritional Risks: None Diabetes: No  How often do you need to have someone help you when you read instructions, pamphlets, or other written materials from your doctor or pharmacy?: 1 - Never What is the last grade level you completed in school?: HSG  Diabetic? No  Interpreter  Needed?: No  Information entered by :: Duanna Runk N. Sarabelle Genson, LPN.   Activities of Daily Living    09/10/2022    4:02 PM  In your present state of health, do you have any difficulty performing the following activities:  Hearing? 0  Vision? 0  Difficulty concentrating or making decisions? 0  Walking or climbing stairs? 1  Dressing or bathing? 0  Doing errands, shopping? 0  Preparing Food and eating ? N  Using the Toilet? N  In the past six months, have you accidently leaked urine? N  Do you have problems with loss of bowel control? N  Managing your Medications? N  Managing your Finances? N  Housekeeping or managing your Housekeeping? N    Patient Care Team: Plotnikov, Georgina Quint, MD as PCP - General Tresa Endo Clovis Pu, MD as PCP - Cardiology (Cardiology) Margaretmary Dys, MD (Radiation Oncology) [name], MD as Attending Physician (Hematology and Oncology) Meryl Dare, MD (Gastroenterology) Jerilee Field, MD as Consulting Physician (Urology) Diona Foley, MD as Consulting Physician (Ophthalmology)  Indicate any recent Medical Services you may have received from other than [facility] providers in the past year (date may be approximate).     Assessment:   This is a routine wellness examination for Aaron [name].  Hearing/Vision screen Hearing Screening - Comments:: Patient has hearing difficulties; no hearing aids.   Vision Screening - Comments:: Wears rx glasses - up to date with routine eye exams with Oliver Pila, MD.   Dietary issues and exercise activities discussed: Current Exercise Habits: The patient does not participate in regular exercise at present, Exercise limited by: respiratory conditions(s);Other - see comments (gait d/o)   Goals Addressed             This Visit's Progress    My goal for 2024 is to  maintain my health and staying independent.        Depression Screen    09/10/2022    4:00 PM 05/21/2022   10:59 AM 09/07/2021    2:56 PM 07/17/2021     2:37 PM 08/12/2020   10:02 AM 07/24/2019   12:03 PM 10/24/2017   10:19 AM  PHQ 2/9 Scores  PHQ - 2 Score 0 0 0 2 0 0 0  PHQ- 9 Score 2          Fall Risk    09/10/2022    3:59 PM 05/21/2022   10:59 AM 09/07/2021    2:59 PM 07/17/2021    2:38 PM 08/12/2020   10:01 AM  Fall Risk   Falls in the past year? 0 0 0 1 0  Number falls in past yr: 0 0 0 1 0  Injury with Fall? 0 0 0 0 0  Risk for fall due to : Impaired balance/gait No Fall Risks No Fall Risks History of fall(s) No Fall Risks  Follow up Falls prevention discussed Falls evaluation completed Falls evaluation completed  Falls evaluation completed    FALL RISK PREVENTION PERTAINING TO THE HOME:  Any stairs in or around the home? Yes  If so, are there any without handrails? No  Home free of loose throw rugs in walkways, pet beds, electrical cords, etc? Yes  Adequate lighting in your home to reduce risk of falls? Yes   ASSISTIVE DEVICES UTILIZED TO PREVENT FALLS:  Life alert? No  Use of a cane, walker or w/c? Yes  Grab bars in the bathroom? Yes  Shower chair or bench in shower? No  Elevated toilet seat or a handicapped toilet? Yes   TIMED UP AND GO:  Was the test performed? No . Telephonic Visit  Cognitive Function:        09/10/2022    3:54 PM 07/24/2019   12:04 PM  6CIT Screen  What Year? 0 points 0 points  What month? 0 points 0 points  What time? 0 points 0 points  Count back from 20 0 points 0 points  Months in reverse 0 points 0 points  Repeat phrase 0 points 2 points  Total Score 0 points 2 points    Immunizations Immunization History  Administered Date(s) Administered   Fluad Quad(high Dose 65+) 12/14/2020, 12/26/2021   Influenza Split 12/27/2010, 12/13/2011   Influenza Whole 02/04/2004, 12/25/2007, 01/04/2010   Influenza, High Dose Seasonal PF 01/01/2015, 12/29/2015, 11/29/2016, 01/07/2018, 11/16/2018   Influenza, Seasonal, Injecte, Preservative Fre 12/19/2012   Influenza-Unspecified 12/23/2013    PFIZER(Purple Top)SARS-COV-2 Vaccination 04/16/2019, 05/07/2019, 01/06/2020, 07/29/2020   Pneumococcal Conjugate-13 01/22/2013   Pneumococcal Polysaccharide-23 02/09/2004, 03/04/2008, 09/01/2015   Td 08/26/2006   Tdap 03/29/2016   Zoster Recombinat (Shingrix) 05/13/2020    TDAP status: Up to date  Flu Vaccine status: Up to date  Pneumococcal vaccine status: Up to date  Covid-19 vaccine status: Completed vaccines  Qualifies for Shingles Vaccine? Yes   Zostavax completed No   Shingrix Completed?: Yes  Screening Tests Health Maintenance  Topic Date Due   COVID-19 Vaccine (5 - 2023-24 season) 11/24/2021   INFLUENZA VACCINE  10/25/2022   Medicare Annual Wellness (AWV)  09/10/2023   DTaP/Tdap/Td (3 - Td or Tdap) 03/29/2026   Pneumonia Vaccine 6+ Years old  Completed   HPV VACCINES  Aged Out   Zoster Vaccines- Shingrix  Discontinued    Health Maintenance  Health Maintenance Due  Topic Date Due   COVID-19 Vaccine (5 -  2023-24 season) 11/24/2021    Colorectal cancer screening: No longer required.   Lung Cancer Screening: (Low Dose CT Chest recommended if Age 38-80 years, 30 pack-year currently smoking OR have quit w/in 15years.) does not qualify.   Lung Cancer Screening Referral: no  Additional Screening:  Hepatitis C Screening: does not qualify; Completed: no  Vision Screening: Recommended annual ophthalmology exams for early detection of glaucoma and other disorders of the eye. Is the patient up to date with their annual eye exam?  Yes  Who is the provider or what is the name of the office in which the patient attends annual eye exams? Sharyn Dross, MD. If pt is not established with a provider, would they like to be referred to a provider to establish care? No .   Dental Screening: Recommended annual dental exams for proper oral hygiene  Community Resource Referral / Chronic Care Management: CRR required this visit?  No   CCM required this visit?  No      Plan:      I have personally reviewed and noted the following in the patient's chart:   Medical and social history Use of alcohol, tobacco or illicit drugs  Current medications and supplements including opioid prescriptions. Patient is not currently taking opioid prescriptions. Functional ability and status Nutritional status Physical activity Advanced directives List of other physicians Hospitalizations, surgeries, and ER visits in previous 12 months Vitals Screenings to include cognitive, depression, and falls Referrals and appointments  In addition, I have reviewed and discussed with patient certain preventive protocols, quality metrics, and best practice recommendations. A written personalized care plan for preventive services as well as general preventive health recommendations were provided to patient.     Mickeal Needy, LPN   1/61/0960   Nurse Notes: Normal cognitive status assessed by direct observation via telephone conversation by this Nurse Health Advisor. No abnormalities found.  Medical screening examination/treatment/procedure(s) were performed by non-physician practitioner and as supervising physician I was immediately available for consultation/collaboration.  I agree with above. Jacinta Shoe, MD

## 2022-09-10 NOTE — Patient Instructions (Addendum)
Aaron [name] , Thank you for taking time to come for your Medicare Wellness Visit. I appreciate your ongoing commitment to your health goals. Please review the following plan we discussed and let me know if I can assist you in the future.   These are the goals we discussed:  Goals      My goal for 2024 is to maintain my health and staying independent.        This is a list of the screening recommended for you and due dates:  Health Maintenance  Topic Date Due   COVID-19 Vaccine (5 - 2023-24 season) 11/24/2021   Flu Shot  10/25/2022   Medicare Annual Wellness Visit  09/10/2023   DTaP/Tdap/Td vaccine (3 - Td or Tdap) 03/29/2026   Pneumonia Vaccine  Completed   HPV Vaccine  Aged Out   Zoster (Shingles) Vaccine  Discontinued    Advanced directives: Yes; Please bring a copy of your health care power of attorney and living will to the office at your convenience.  Conditions/risks identified: Yes  Next appointment: Follow up in one year for your annual wellness visit with Nurse Percell Miller via telephone visit on 09/17/2023 at 3:00 p.m. If you need to cancel or reschedule please call [phone].  Preventive Care 85 Years and Older, Male  Preventive care refers to lifestyle choices and visits with your health care provider that can promote health and wellness. What does preventive care include? A yearly physical exam. This is also called an annual well check. Dental exams once or twice a year. Routine eye exams. Ask your health care provider how often you should have your eyes checked. Personal lifestyle choices, including: Daily care of your teeth and gums. Regular physical activity. Eating a healthy diet. Avoiding tobacco and drug use. Limiting alcohol use. Practicing safe sex. Taking low doses of aspirin every day. Taking vitamin and mineral supplements as recommended by your health care provider. What happens during an annual well check? The services and screenings done by your  health care provider during your annual well check will depend on your age, overall health, lifestyle risk factors, and family history of disease. Counseling  Your health care provider may ask you questions about your: Alcohol use. Tobacco use. Drug use. Emotional well-being. Home and relationship well-being. Sexual activity. Eating habits. History of falls. Memory and ability to understand (cognition). Work and work Astronomer. Screening  You may have the following tests or measurements: Height, weight, and BMI. Blood pressure. Lipid and cholesterol levels. These may be checked every 5 years, or more frequently if you are over 66 years old. Skin check. Lung cancer screening. You may have this screening every year starting at age 103 if you have a 30-pack-year history of smoking and currently smoke or have quit within the past 15 years. Fecal occult blood test (FOBT) of the stool. You may have this test every year starting at age 29. Flexible sigmoidoscopy or colonoscopy. You may have a sigmoidoscopy every 5 years or a colonoscopy every 10 years starting at age 38. Prostate cancer screening. Recommendations will vary depending on your family history and other risks. Hepatitis C blood test. Hepatitis B blood test. Sexually transmitted disease (STD) testing. Diabetes screening. This is done by checking your blood sugar (glucose) after you have not eaten for a while (fasting). You may have this done every 1-3 years. Abdominal aortic aneurysm (AAA) screening. You may need this if you are a current or former smoker. Osteoporosis. You may be screened starting  at age 69 if you are at high risk. Talk with your health care provider about your test results, treatment options, and if necessary, the need for more tests. Vaccines  Your health care provider may recommend certain vaccines, such as: Influenza vaccine. This is recommended every year. Tetanus, diphtheria, and acellular pertussis  (Tdap, Td) vaccine. You may need a Td booster every 10 years. Zoster vaccine. You may need this after age 61. Pneumococcal 13-valent conjugate (PCV13) vaccine. One dose is recommended after age 74. Pneumococcal polysaccharide (PPSV23) vaccine. One dose is recommended after age 60. Talk to your health care provider about which screenings and vaccines you need and how often you need them. This information is not intended to replace advice given to you by your health care provider. Make sure you discuss any questions you have with your health care provider. Document Released: 04/08/2015 Document Revised: 11/30/2015 Document Reviewed: 01/11/2015 Elsevier Interactive Patient Education  2017 [employer] Prevention in the Home Falls can cause injuries. They can happen to people of all ages. There are many things you can do to make your home safe and to help prevent falls. What can I do on the outside of my home? Regularly fix the edges of walkways and driveways and fix any cracks. Remove anything that might make you trip as you walk through a door, such as a raised step or threshold. Trim any bushes or trees on the path to your home. Use bright outdoor lighting. Clear any walking paths of anything that might make someone trip, such as rocks or tools. Regularly check to see if handrails are loose or broken. Make sure that both sides of any steps have handrails. Any raised decks and porches should have guardrails on the edges. Have any leaves, snow, or ice cleared regularly. Use sand or salt on walking paths during winter. Clean up any spills in your garage right away. This includes oil or grease spills. What can I do in the bathroom? Use night lights. Install grab bars by the toilet and in the tub and shower. Do not use towel bars as grab bars. Use non-skid mats or decals in the tub or shower. If you need to sit down in the shower, use a plastic, non-slip stool. Keep the floor dry. Clean  up any water that spills on the floor as soon as it happens. Remove soap buildup in the tub or shower regularly. Attach bath mats securely with double-sided non-slip rug tape. Do not have throw rugs and other things on the floor that can make you trip. What can I do in the bedroom? Use night lights. Make sure that you have a light by your bed that is easy to reach. Do not use any sheets or blankets that are too big for your bed. They should not hang down onto the floor. Have a firm chair that has side arms. You can use this for support while you get dressed. Do not have throw rugs and other things on the floor that can make you trip. What can I do in the kitchen? Clean up any spills right away. Avoid walking on wet floors. Keep items that you use a lot in easy-to-reach places. If you need to reach something above you, use a strong step stool that has a grab bar. Keep electrical cords out of the way. Do not use floor polish or wax that makes floors slippery. If you must use wax, use non-skid floor wax. Do not have throw rugs  and other things on the floor that can make you trip. What can I do with my stairs? Do not leave any items on the stairs. Make sure that there are handrails on both sides of the stairs and use them. Fix handrails that are broken or loose. Make sure that handrails are as long as the stairways. Check any carpeting to make sure that it is firmly attached to the stairs. Fix any carpet that is loose or worn. Avoid having throw rugs at the top or bottom of the stairs. If you do have throw rugs, attach them to the floor with carpet tape. Make sure that you have a light switch at the top of the stairs and the bottom of the stairs. If you do not have them, ask someone to add them for you. What else can I do to help prevent falls? Wear shoes that: Do not have high heels. Have rubber bottoms. Are comfortable and fit you well. Are closed at the toe. Do not wear sandals. If you  use a stepladder: Make sure that it is fully opened. Do not climb a closed stepladder. Make sure that both sides of the stepladder are locked into place. Ask someone to hold it for you, if possible. Clearly mark and make sure that you can see: Any grab bars or handrails. First and last steps. Where the edge of each step is. Use tools that help you move around (mobility aids) if they are needed. These include: Canes. Walkers. Scooters. Crutches. Turn on the lights when you go into a dark area. Replace any light bulbs as soon as they burn out. Set up your furniture so you have a clear path. Avoid moving your furniture around. If any of your floors are uneven, fix them. If there are any pets around you, be aware of where they are. Review your medicines with your doctor. Some medicines can make you feel dizzy. This can increase your chance of falling. Ask your doctor what other things that you can do to help prevent falls. This information is not intended to replace advice given to you by your health care provider. Make sure you discuss any questions you have with your health care provider. Document Released: 01/06/2009 Document Revised: 08/18/2015 Document Reviewed: 04/16/2014 Elsevier Interactive Patient Education  2017 [employer].

## 2022-09-11 ENCOUNTER — Other Ambulatory Visit: Payer: Self-pay | Admitting: *Deleted

## 2022-09-11 DIAGNOSIS — C61 Malignant neoplasm of prostate: Secondary | ICD-10-CM | POA: Diagnosis not present

## 2022-09-11 MED ORDER — LEVOTHYROXINE SODIUM 150 MCG PO TABS: 150.0000 ug | ORAL_TABLET | Freq: Every day | ORAL | 3 refills | Status: DC

## 2022-09-20 ENCOUNTER — Encounter: Payer: Self-pay | Admitting: Internal Medicine

## 2022-09-20 ENCOUNTER — Ambulatory Visit (INDEPENDENT_AMBULATORY_CARE_PROVIDER_SITE_OTHER): Payer: Medicare Other | Admitting: Internal Medicine

## 2022-09-20 VITALS — BP 110/64 | HR 84 | Temp 97.9°F | Ht 75.0 in | Wt 195.0 lb

## 2022-09-20 DIAGNOSIS — E039 Hypothyroidism, unspecified: Secondary | ICD-10-CM | POA: Diagnosis not present

## 2022-09-20 DIAGNOSIS — N183 Chronic kidney disease, stage 3 unspecified: Secondary | ICD-10-CM | POA: Diagnosis not present

## 2022-09-20 DIAGNOSIS — I10 Essential (primary) hypertension: Secondary | ICD-10-CM | POA: Diagnosis not present

## 2022-09-20 DIAGNOSIS — D500 Iron deficiency anemia secondary to blood loss (chronic): Secondary | ICD-10-CM | POA: Diagnosis not present

## 2022-09-20 DIAGNOSIS — R413 Other amnesia: Secondary | ICD-10-CM | POA: Diagnosis not present

## 2022-09-20 LAB — COMPREHENSIVE METABOLIC PANEL
ALT: 7 U/L (ref 0–53)
AST: 18 U/L (ref 0–37)
Albumin: 4 g/dL (ref 3.5–5.2)
Alkaline Phosphatase: 73 U/L (ref 39–117)
BUN: 17 mg/dL (ref 6–23)
CO2: 30 mEq/L (ref 19–32)
Calcium: 10.5 mg/dL (ref 8.4–10.5)
Chloride: 97 mEq/L (ref 96–112)
Creatinine, Ser: 1.59 mg/dL — ABNORMAL HIGH (ref 0.40–1.50)
GFR: 39.55 mL/min — ABNORMAL LOW (ref >60.00)
Glucose, Bld: 112 mg/dL — ABNORMAL HIGH (ref 70–99)
Potassium: 3.8 mEq/L (ref 3.5–5.1)
Sodium: 136 mEq/L (ref 135–145)
Total Bilirubin: 0.3 mg/dL (ref 0.2–1.2)
Total Protein: 7.9 g/dL (ref 6.0–8.3)

## 2022-09-20 LAB — T4, FREE: Free T4: 0.74 ng/dL (ref 0.60–1.60)

## 2022-09-20 LAB — TSH: TSH: >48.7 u[IU]/mL — ABNORMAL HIGH (ref 0.35–5.50)

## 2022-09-20 NOTE — Assessment & Plan Note (Signed)
Hydrate well 

## 2022-09-20 NOTE — Assessment & Plan Note (Signed)
BetterWorse.  Low FT4 Increase Levothyroxin dose

## 2022-09-20 NOTE — Progress Notes (Signed)
Subjective:  Patient ID: Aaron [name], male    DOB: [birth year]  Age: 87 y.o. MRN: [id number]  CC: Follow-up (6 week f/u)   HPI Javonta Gronau presents for fatigue, low thyroid, anemia  Outpatient Medications Prior to Visit  Medication Sig Dispense Refill   apalutamide (ERLEADA) 60 MG tablet Take 240 mg by mouth as directed. Take 4 tablet at bedtime May be taken with or without food. Swallow tablets whole.     aspirin 81 MG EC tablet Take 81 mg by mouth daily.     CHOLECALCIFEROL PO Take 600 Units by mouth daily.     clonazePAM (KLONOPIN) 1 MG tablet TAKE 1/2 TO 1 TABLET BY MOUTH  TWICE DAILY AS NEEDED FOR  ANXIETY 180 tablet 1   Dextromethorphan-guaiFENesin 20-400 MG TABS Take 1 tablet by mouth daily as needed.      finasteride (PROSCAR) 5 MG tablet TAKE 1 TABLET BY MOUTH  DAILY 90 tablet 3   latanoprost (XALATAN) 0.005 % ophthalmic solution Place 1 drop into the left eye at bedtime. Place 1 drop into the left eye at bedtime     levothyroxine (SYNTHROID) 150 MCG tablet Take 1 tablet (150 mcg total) by mouth daily. 90 tablet 3   metoprolol succinate (TOPROL-XL) 25 MG 24 hr tablet TAKE ONE-HALF TABLET BY MOUTH  ALTERNATING WITH 1/4 OF A TABLET BY MOUTH DAILY 34 tablet 2   omeprazole (PRILOSEC) 40 MG capsule TAKE 1 CAPSULE BY MOUTH DAILY 90 capsule 0   Relugolix (ORGOVYX) 120 MG TABS 1 po qd (Patient taking differently: 2 pills in AM) 90 tablet 3   simvastatin (ZOCOR) 20 MG tablet Take 0.5 tablets (10 mg total) by mouth daily. 90 tablet 3   No facility-administered medications prior to visit.    ROS: Review of Systems  Constitutional:  Positive for fatigue. Negative for appetite change and unexpected weight change.  HENT:  Negative for congestion, nosebleeds, sneezing, sore throat and trouble swallowing.   Eyes:  Negative for itching and visual disturbance.  Respiratory:  Negative for cough.   Cardiovascular:  Negative for chest pain, palpitations and leg swelling.   Gastrointestinal:  Negative for abdominal distention, blood in stool, diarrhea and nausea.  Genitourinary:  Negative for frequency and hematuria.  Musculoskeletal:  Positive for gait problem. Negative for back pain, joint swelling and neck pain.  Skin:  Negative for rash.  Neurological:  Negative for dizziness, tremors, speech difficulty and weakness.  Psychiatric/Behavioral:  Negative for agitation, dysphoric mood and sleep disturbance. The patient is not nervous/anxious.     Objective:  BP 110/64 (BP Location: Left Arm, Patient Position: Sitting, Cuff Size: Large)   Pulse 84   Temp 97.9 F (36.6 C) (Oral)   Ht 6\' 3"  (1.905 m)   Wt 195 lb (88.5 kg)   SpO2 98%   BMI 24.37 kg/m   BP Readings from Last 3 Encounters:  09/20/22 110/64  08/09/22 102/68  07/12/22 130/82    Wt Readings from Last 3 Encounters:  09/20/22 195 lb (88.5 kg)  09/10/22 200 lb (90.7 kg)  08/09/22 193 lb (87.5 kg)    Physical Exam Constitutional:      General: He is not in acute distress.    Appearance: Normal appearance. He is well-developed.     Comments: NAD  Eyes:     Conjunctiva/sclera: Conjunctivae normal.     Pupils: Pupils are equal, round, and reactive to light.  Neck:     Thyroid: No thyromegaly.  Vascular: No JVD.  Cardiovascular:     Rate and Rhythm: Normal rate and regular rhythm.     Heart sounds: Normal heart sounds. No murmur heard.    No friction rub. No gallop.  Pulmonary:     Effort: Pulmonary effort is normal. No respiratory distress.     Breath sounds: Normal breath sounds. No wheezing or rales.  Chest:     Chest wall: No tenderness.  Abdominal:     General: Bowel sounds are normal. There is no distension.     Palpations: Abdomen is soft. There is no mass.     Tenderness: There is no abdominal tenderness. There is no guarding or rebound.  Musculoskeletal:        General: Tenderness present. Normal range of motion.     Cervical back: Normal range of motion.   Lymphadenopathy:     Cervical: No cervical adenopathy.  Skin:    General: Skin is warm and dry.     Findings: No rash.  Neurological:     Mental Status: He is alert and oriented to person, place, and time.     Cranial Nerves: No cranial nerve deficit.     Motor: No abnormal muscle tone.     Coordination: Coordination abnormal.     Gait: Gait abnormal.     Deep Tendon Reflexes: Reflexes are normal and symmetric.  Psychiatric:        Behavior: Behavior normal.        Thought Content: Thought content normal.        Judgment: Judgment normal.   Hard hearing Using a pole  Lab Results  Component Value Date   WBC 3.6 07/19/2022   HGB 11.7 (L) 07/19/2022   HCT 36.2 (L) 07/19/2022   PLT 231 07/19/2022   GLUCOSE 130 (H) 07/19/2022   CHOL 219 (H) 07/19/2022   TRIG 140 07/19/2022   HDL 56 07/19/2022   LDLCALC 138 (H) 07/19/2022   ALT 6 07/19/2022   AST 17 07/19/2022   NA 134 07/19/2022   K 4.3 07/19/2022   CL 94 (L) 07/19/2022   CREATININE 1.71 (H) 07/19/2022   BUN 21 07/19/2022   CO2 25 07/19/2022   TSH 173.000 (H) 07/19/2022   PSA 10.87 (H) 04/20/2020   INR 1.19 04/28/2009   HGBA1C 6.9 (H) 01/15/2022    No results found.  Assessment & Plan:   Problem List Items Addressed This Visit     Iron deficiency anemia    Check CBC      Essential hypertension    On NAS diet      Hypothyroidism - Primary    BetterWorse.  Low FT4 Increase Levothyroxin dose      CRI (chronic renal insufficiency), stage 3 (moderate) (HCC)    Hydrate well      Memory problem    Get hearing aids         No orders of the defined types were placed in this encounter.     Follow-up: Return in about 3 months (around 12/21/2022) for a follow-up visit.  Sonda Primes, MD

## 2022-09-20 NOTE — Assessment & Plan Note (Signed)
On NAS diet  

## 2022-09-20 NOTE — Assessment & Plan Note (Signed)
Check CBC 

## 2022-09-20 NOTE — Assessment & Plan Note (Signed)
Get hearing aids

## 2022-09-21 ENCOUNTER — Other Ambulatory Visit (HOSPITAL_COMMUNITY): Payer: Self-pay | Admitting: Urology

## 2022-09-21 DIAGNOSIS — C61 Malignant neoplasm of prostate: Secondary | ICD-10-CM

## 2022-09-21 DIAGNOSIS — C778 Secondary and unspecified malignant neoplasm of lymph nodes of multiple regions: Secondary | ICD-10-CM

## 2022-10-09 ENCOUNTER — Other Ambulatory Visit: Payer: Self-pay | Admitting: Gastroenterology

## 2022-10-18 ENCOUNTER — Encounter (HOSPITAL_COMMUNITY)
Admission: RE | Admit: 2022-10-18 | Discharge: 2022-10-18 | Disposition: A | Discharge status: Home / Self Care | Payer: Medicare Other | Source: Ambulatory Visit | Attending: Urology | Admitting: Urology

## 2022-10-18 DIAGNOSIS — C61 Malignant neoplasm of prostate: Secondary | ICD-10-CM | POA: Insufficient documentation

## 2022-10-18 DIAGNOSIS — C778 Secondary and unspecified malignant neoplasm of lymph nodes of multiple regions: Secondary | ICD-10-CM | POA: Diagnosis not present

## 2022-10-18 DIAGNOSIS — C7951 Secondary malignant neoplasm of bone: Secondary | ICD-10-CM | POA: Diagnosis not present

## 2022-10-18 MED ORDER — PIFLIFOLASTAT F 18 (PYLARIFY) INJECTION
9.0000 | Freq: Once | INTRAVENOUS | Status: AC
  Administered 2022-10-18: 9.153 via INTRAVENOUS

## 2022-10-22 ENCOUNTER — Encounter: Payer: Self-pay | Admitting: Internal Medicine

## 2022-10-22 ENCOUNTER — Ambulatory Visit (INDEPENDENT_AMBULATORY_CARE_PROVIDER_SITE_OTHER): Payer: Medicare Other | Admitting: Internal Medicine

## 2022-10-22 VITALS — BP 110/68 | HR 91 | Temp 98.2°F | Ht 75.0 in | Wt 183.0 lb

## 2022-10-22 DIAGNOSIS — C61 Malignant neoplasm of prostate: Secondary | ICD-10-CM

## 2022-10-22 DIAGNOSIS — D500 Iron deficiency anemia secondary to blood loss (chronic): Secondary | ICD-10-CM

## 2022-10-22 DIAGNOSIS — C154 Malignant neoplasm of middle third of esophagus: Secondary | ICD-10-CM | POA: Diagnosis not present

## 2022-10-22 DIAGNOSIS — R634 Abnormal weight loss: Secondary | ICD-10-CM | POA: Diagnosis not present

## 2022-10-22 DIAGNOSIS — R5383 Other fatigue: Secondary | ICD-10-CM

## 2022-10-22 DIAGNOSIS — C155 Malignant neoplasm of lower third of esophagus: Secondary | ICD-10-CM

## 2022-10-22 NOTE — Assessment & Plan Note (Addendum)
PET CT scan was done last week, report is pending Labs OK

## 2022-10-22 NOTE — Patient Instructions (Signed)
Wt Readings from Last 3 Encounters:  10/22/22 183 lb (83 kg)  09/20/22 195 lb (88.5 kg)  09/10/22 200 lb (90.7 kg)

## 2022-10-22 NOTE — Assessment & Plan Note (Signed)
PET CT scan was done last week, report is pending

## 2022-10-22 NOTE — Assessment & Plan Note (Signed)
PET CT scan was done last week, report is pending Labs were OK

## 2022-10-22 NOTE — Assessment & Plan Note (Signed)
Monitor CBC 

## 2022-10-22 NOTE — Progress Notes (Signed)
Subjective:  Patient ID: Aaron [name], male    DOB: 16-Aug-1935  Age: 87 y.o. MRN: [id number]  CC: Follow-up (5 MNTH F/U)   HPI Aaron [name] presents for hypothyroidism, fatigue, wt loss, prostate and esophageal CA C/o fatigue  Outpatient Medications Prior to Visit  Medication Sig Dispense Refill   apalutamide (ERLEADA) 60 MG tablet Take 240 mg by mouth as directed. Take 4 tablet at bedtime May be taken with or without food. Swallow tablets whole.     aspirin 81 MG EC tablet Take 81 mg by mouth daily.     CHOLECALCIFEROL PO Take 600 Units by mouth daily.     clonazePAM (KLONOPIN) 1 MG tablet TAKE 1/2 TO 1 TABLET BY MOUTH  TWICE DAILY AS NEEDED FOR  ANXIETY 180 tablet 1   Dextromethorphan-guaiFENesin 20-400 MG TABS Take 1 tablet by mouth daily as needed.      finasteride (PROSCAR) 5 MG tablet TAKE 1 TABLET BY MOUTH  DAILY 90 tablet 3   latanoprost (XALATAN) 0.005 % ophthalmic solution Place 1 drop into the left eye at bedtime. Place 1 drop into the left eye at bedtime     levothyroxine (SYNTHROID) 150 MCG tablet Take 1 tablet (150 mcg total) by mouth daily. 90 tablet 3   metoprolol succinate (TOPROL-XL) 25 MG 24 hr tablet TAKE ONE-HALF TABLET BY MOUTH  ALTERNATING WITH 1/4 OF A TABLET BY MOUTH DAILY 34 tablet 2   omeprazole (PRILOSEC) 40 MG capsule TAKE 1 CAPSULE BY MOUTH DAILY 90 capsule 0   Relugolix (ORGOVYX) 120 MG TABS 1 po qd (Patient taking differently: 2 pills in AM) 90 tablet 3   simvastatin (ZOCOR) 20 MG tablet Take 0.5 tablets (10 mg total) by mouth daily. 90 tablet 3   No facility-administered medications prior to visit.    ROS: Review of Systems  Constitutional:  Positive for fatigue and unexpected weight change. Negative for appetite change.  HENT:  Negative for congestion, nosebleeds, sneezing, sore throat and trouble swallowing.   Eyes:  Negative for itching and visual disturbance.  Respiratory:  Negative for cough.   Cardiovascular:  Negative for chest pain,  palpitations and leg swelling.  Gastrointestinal:  Negative for abdominal distention, blood in stool, diarrhea and nausea.  Genitourinary:  Negative for frequency and hematuria.  Musculoskeletal:  Positive for gait problem. Negative for back pain, joint swelling and neck pain.  Skin:  Negative for rash.  Neurological:  Negative for dizziness, tremors, speech difficulty and weakness.  Psychiatric/Behavioral:  Negative for agitation, dysphoric mood, sleep disturbance and suicidal ideas. The patient is not nervous/anxious.     Objective:  BP 110/68 (BP Location: Right Arm, Patient Position: Sitting, Cuff Size: Large)   Pulse 91   Temp 98.2 F (36.8 C) (Oral)   Ht 6\' 3"  (1.905 m)   Wt 183 lb (83 kg)   SpO2 95%   BMI 22.87 kg/m   BP Readings from Last 3 Encounters:  10/22/22 110/68  09/20/22 110/64  08/09/22 102/68    Wt Readings from Last 3 Encounters:  10/22/22 183 lb (83 kg)  09/20/22 195 lb (88.5 kg)  09/10/22 200 lb (90.7 kg)    Physical Exam Constitutional:      General: He is not in acute distress.    Appearance: Normal appearance. He is well-developed.     Comments: NAD  Eyes:     Conjunctiva/sclera: Conjunctivae normal.     Pupils: Pupils are equal, round, and reactive to light.  Neck:  Thyroid: No thyromegaly.     Vascular: No JVD.  Cardiovascular:     Rate and Rhythm: Normal rate and regular rhythm.     Heart sounds: Normal heart sounds. No murmur heard.    No friction rub. No gallop.  Pulmonary:     Effort: Pulmonary effort is normal. No respiratory distress.     Breath sounds: Normal breath sounds. No wheezing or rales.  Chest:     Chest wall: No tenderness.  Abdominal:     General: Bowel sounds are normal. There is no distension.     Palpations: Abdomen is soft. There is no mass.     Tenderness: There is no abdominal tenderness. There is no guarding or rebound.  Musculoskeletal:        General: No tenderness. Normal range of motion.     Cervical  back: Normal range of motion.     Right lower leg: No edema.     Left lower leg: No edema.  Lymphadenopathy:     Cervical: No cervical adenopathy.  Skin:    General: Skin is warm and dry.     Findings: No rash.  Neurological:     Mental Status: He is alert and oriented to person, place, and time.     Cranial Nerves: No cranial nerve deficit.     Motor: No abnormal muscle tone.     Coordination: Coordination normal.     Gait: Gait normal.     Deep Tendon Reflexes: Reflexes are normal and symmetric.  Psychiatric:        Behavior: Behavior normal.        Thought Content: Thought content normal.        Judgment: Judgment normal.    Using a pole   Lab Results  Component Value Date   WBC 3.6 07/19/2022   HGB 11.7 (L) 07/19/2022   HCT 36.2 (L) 07/19/2022   PLT 231 07/19/2022   GLUCOSE 112 (H) 09/20/2022   CHOL 219 (H) 07/19/2022   TRIG 140 07/19/2022   HDL 56 07/19/2022   LDLCALC 138 (H) 07/19/2022   ALT 7 09/20/2022   AST 18 09/20/2022   NA 136 09/20/2022   K 3.8 09/20/2022   CL 97 09/20/2022   CREATININE 1.59 (H) 09/20/2022   BUN 17 09/20/2022   CO2 30 09/20/2022   TSH >48.70 (H) 09/20/2022   PSA 10.87 (H) 04/20/2020   INR 1.19 04/28/2009   HGBA1C 6.9 (H) 01/15/2022    No results found.  Assessment & Plan:   Problem List Items Addressed This Visit     Malignant neoplasm of thoracic esophagus (HCC) - Primary    PET CT scan was done last week, report is pending      Iron deficiency anemia    Monitor CBC      Weight loss    PET CT scan was done last week, report is pending Labs OK      Esophageal cancer [facility])    PET CT scan was done last week, report is pending      Prostate cancer [facility])    PET CT scan was done last week, report is pending      Fatigue    PET CT scan was done last week, report is pending Labs were OK         No orders of the defined types were placed in this encounter.     Follow-up: Return in about 4 weeks (around  11/19/2022) for a follow-up visit.  Sonda Primes, MD

## 2022-11-08 ENCOUNTER — Encounter: Payer: Self-pay | Admitting: *Deleted

## 2022-11-08 NOTE — Progress Notes (Signed)
PATIENT NAVIGATOR PROGRESS NOTE  Name: Aaron [name] Date: 11/08/2022 MRN: [id number]  DOB: 10-Apr-1935   Reason for visit:  New patient appt  Comments:  Called and spoke with wife Ms Kille and scheduled him for appt with Dr Truett Perna on 11/09/22 at 1:40 pm  Reviewed directions to building and parking and gave contact number to call with any questions    Time spent counseling/coordinating care: 30-45 minutes

## 2022-11-09 ENCOUNTER — Inpatient Hospital Stay: Payer: Medicare Other

## 2022-11-09 ENCOUNTER — Other Ambulatory Visit: Payer: Self-pay

## 2022-11-09 ENCOUNTER — Inpatient Hospital Stay: Payer: Medicare Other | Attending: Oncology | Admitting: Oncology

## 2022-11-09 VITALS — BP 100/63 | HR 93 | Temp 98.1°F | Resp 18 | Ht 75.0 in | Wt 179.0 lb

## 2022-11-09 DIAGNOSIS — Z87891 Personal history of nicotine dependence: Secondary | ICD-10-CM | POA: Diagnosis not present

## 2022-11-09 DIAGNOSIS — R300 Dysuria: Secondary | ICD-10-CM | POA: Diagnosis not present

## 2022-11-09 DIAGNOSIS — Z8546 Personal history of malignant neoplasm of prostate: Secondary | ICD-10-CM | POA: Insufficient documentation

## 2022-11-09 DIAGNOSIS — C61 Malignant neoplasm of prostate: Secondary | ICD-10-CM

## 2022-11-09 DIAGNOSIS — R32 Unspecified urinary incontinence: Secondary | ICD-10-CM | POA: Insufficient documentation

## 2022-11-09 DIAGNOSIS — C7951 Secondary malignant neoplasm of bone: Secondary | ICD-10-CM | POA: Insufficient documentation

## 2022-11-09 LAB — URINALYSIS, COMPLETE (UACMP) WITH MICROSCOPIC
Bacteria, UA: NONE SEEN
Bilirubin Urine: NEGATIVE
Glucose, UA: NEGATIVE mg/dL
Hgb urine dipstick: NEGATIVE
Ketones, ur: NEGATIVE mg/dL
Leukocytes,Ua: NEGATIVE
Nitrite: NEGATIVE
Specific Gravity, Urine: 1.018 (ref 1.005–1.030)
pH: 5.5 (ref 5.0–8.0)

## 2022-11-09 NOTE — Progress Notes (Signed)
[facility] New Patient Consult   Requesting MD: Jerilee Field, Md [address] [place],  [place] [address]   Aaron [name] 87 y.o.  [birth year]    Reason for Consult: Prostate cancer   HPI: [name] has a remote history of esophagus cancer.  He has a history of an elevated PSA.  He was treated with finasteride initially.  The PSA rose on finasteride.  He was diagnosed with a Gleason 9 prostate cancer in June 2022.  A PSMA scan 10/14/2020 revealed marked increased activity in the prostate and seminal vesicles.  There was also increased activity in peritoneal and pelvic lymph nodes.  No evidence of metastatic disease in the bones. He was referred to Dr. Kathrynn Running and was treated with external beam radiation in combination with androgen deprivation therapy.  He began treatment with Orgovyx and Erleada and this has continued to the present.  He completed external beam radiation to the prostate, seminal vesicles, and hypermetabolic lymph nodes in December 2022.  He had an initial PSA response.  The PSA was higher in May and June. He was referred for a restaging PSMA scan on 10/18/2022.  The PSMA scan confirmed diffuse bone metastases.  He is referred for oncology evaluation.  [name] is here with his wife and daughter.  He has developed generalized weakness with anorexia and weight loss over the past several weeks.  No pain.    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, now removed    Esophageal stricture    Gastritis    GERD (gastroesophageal reflux disease)    Hemorrhoids    Hiatal hernia    History of chemotherapy 05/03/09-06/16/09   Taxol/carboplatin   History of nuclear stress test  07/2008   exercise; mild-mod perfusion dfect in basal inf/mid inf/apical inf regions; EKG negative for ischemia   History of radiation therapy 05/03/09-06/16/09   squamous cell ca of esophagus   Hyperlipidemia    Dr Tresa Endo   Hypothyroid    s/p radiotherapy   Prostate cancer [facility])     Past Surgical History:  Procedure Laterality Date   CARDIAC CATHETERIZATION  08/2008   mild nonobstructive CAD with 30% LAD stenosis prox & 20% OM1 narrowing    COLONOSCOPY     CREATION OF CUTANEOUS STOMA  2011   ESOPHAGOGASTRODUODENOSCOPY  10/03/2011   Procedure: ESOPHAGOGASTRODUODENOSCOPY (EGD);  Surgeon: Meryl Dare, MD,FACG;  Location: [facility];  Service: Endoscopy;  Laterality: N/A;  no fluro needed   Lower Back Cyst Removed     [facility]   POLYPECTOMY     [name] DILATION  10/03/2011   Procedure: [name] DILATION;  Surgeon: Meryl Dare, MD,FACG;  Location: [facility];  Service: Endoscopy;  Laterality: N/A;   TONSILLECTOMY     age 89   TRANSTHORACIC ECHOCARDIOGRAM  06/2009   EF=>55%; trace MR/TR   UPPER GASTROINTESTINAL ENDOSCOPY      Medications: Reviewed  Allergies:  Allergies  Allergen Reactions   Amoxicillin     REACTION: rash   Cefuroxime Axetil     REACTION: nit feeling well   Fluconazole     REACTION:  shaking   Escitalopram Other (See Comments)    Nightmares, numbness in tongue, dizziness    Family history: 3 brothers have prostate cancer.  His father had prostate cancer.  2 brothers had colon cancer.  A sister had colon cancer.  Social History:   He lives with his wife and son in [place].  He is retired from a Development worker, international aid.  He quit smoking cigarettes 20 years ago.  He does not use alcohol.  No transfusion history.  No risk factor for HIV or hepatitis.  ROS:   Positives include: Constipation, urinary incontinence, hearing loss, diffuse weakness of the arms and legs, hot flashes lasting 1 minute, anorexia and weight loss  A complete ROS was otherwise  negative.  Physical Exam:  Blood pressure 100/63, pulse 93, temperature 98.1 F (36.7 C), temperature source Oral, resp. rate 18, height 6\' 3"  (1.905 m), weight 179 lb (81.2 kg), SpO2 100%.  HEENT: Mild thrush at the left buccal mucosa and posterior palate, upper and lower denture plate Lungs: Distant breath sounds, no respiratory distress Cardiac: Regular rate and rhythm Abdomen: No hepatosplenomegaly, no mass, nontender  Vascular: No leg edema Lymph nodes: No cervical, supraclavicular, axillary, or inguinal nodes Neurologic: Alert and oriented, the motor exam appears intact in the arms, legs, and feet bilaterally.  He ambulated to the exam table with assistance Skin: No rash Musculoskeletal: No spine tenderness   LAB:  CBC  Lab Results  Component Value Date   WBC 3.6 07/19/2022   HGB 11.7 (L) 07/19/2022   HCT 36.2 (L) 07/19/2022   MCV 94 07/19/2022   PLT 231 07/19/2022   NEUTROABS 3.9 01/15/2022        CMP  Lab Results  Component Value Date   NA 136 09/20/2022   K 3.8 09/20/2022   CL 97 09/20/2022   CO2 30 09/20/2022   GLUCOSE 112 (H) 09/20/2022   BUN 17 09/20/2022   CREATININE 1.59 (H) 09/20/2022   CALCIUM 10.5 09/20/2022   PROT 7.9 09/20/2022   ALBUMIN 4.0 09/20/2022   AST 18 09/20/2022   ALT 7 09/20/2022   ALKPHOS 73 09/20/2022   BILITOT 0.3 09/20/2022   GFRNONAA >60 08/15/2009   GFRAA  08/15/2009    >60        The eGFR has been calculated using the MDRD equation. This calculation has not been validated in all clinical situations. eGFR's persistently <60 mL/min signify possible Chronic Kidney Disease.    09/11/2022-PSA 12.7, testosterone 16.9   Assessment/Plan:    Squamous cell carcinoma of the esophagus, status post an endoscopic biopsy, 04/07/2009, with the pathology confirming at least superficial invasion. PET scan, 04/20/2009, confirmed hypermetabolic activity associated with the upper and thoracic esophagus mass with a single  hypermetabolic right paratracheal lymph node. He began concurrent radiation and weekly Taxol/carboplatin chemotherapy, 05/03/2009. The last treatment with Taxol/carboplatin was given 06/14/2009. He completed radiation on 06/16/2009.   Solid/liquid dysphagia secondary to the proximal esophageal mass, resolved. He is status post an esophageal dilatation for management of a stricture, 08/24/2009, repeat esophageal dilatation 10/03/2011. Borderline enlarged mediastinal and gastrohepatic lymph nodes on staging CT scans. PET scan 04/20/2009 confirmed hypermetabolic activity associated with the right paratracheal lymph node. Hypertension. Status post placement of a feeding jejunostomy tube by Dr. Michaell Cowing. The jejunostomy tube has been removed. Admission with neutropenia and anemia, March 2011.   Fever of unknown origin, April 2011, resolved. He was treated with prednisone beginning on 07/13/2009 for presumed radiation pneumonitis. He developed episodes of rigors  on prednisone, and the prednisone was discontinued, 07/15/2009. A CT scan did not reveal evidence for an infectious process. Zoster rash at the right back and anterior chest July 2013. Questionable leukoplakia on exam 04/09/2013-he was evaluated by dental medicine Prostate cancer-Gleason 9 prostate cancer diagnosed in June 2022 Orgovyx August 2022, and apalutamide, September 2022 on the HERO trial 01/12/2021 - 03/14/2021 external beam radiation, 45 Gy in 25 fractions to prostate, seminal vesicles, and pelvic lymph nodes, prostate only boosted to 75 Gy with 15 additional fractions of 2 Gy nodes Rising PSA May 2024 09/11/2022 PSA 12.7, testosterone 16.9 PSMA PET 10/18/2022-significant decrease in hypermetabolism associated with the prostate, abdominopelvic lymph nodes, and seminal vesicles, development of diffuse osseous and probable thoracic lymph node metastases   Disposition:   Mr. Athas has a remote history of esophagus cancer.  He was diagnosed  with prostate cancer in 2022.  He was treated with external beam radiation and androgen deprivation therapy.  He continues androgen deprivation therapy.  His performance status has declined over the past few months and the PSA is higher.  A PSMA scan 10/18/2022 confirms the development of diffuse bone metastases.  Mr. Watson is symptomatic with failure to thrive.  He does not have significant pain.  He knows to contact us if he develops focal neurologic symptoms including leg numbness/weakness or new difficulty with bowel or bladder control.  We discussed treatment options including comfort care/hospice.  He does not appear to be a candidate for docetaxel.  His family agrees.  I recommend he discontinue the apalutamide.  He will continue Relugolix.  I will refer him for consideration of Pluvicto therapy.  We made a referral to Dr. Amil Amen.  We also discussed the small chance of a clinical response with second line antiandrogen therapy.  He will return for an office visit in approximately 3 weeks.  He will call for new symptoms.  Thornton Papas, MD  11/09/2022, 2:06 PM

## 2022-11-10 LAB — URINE CULTURE: Culture: NO GROWTH

## 2022-11-12 ENCOUNTER — Observation Stay (HOSPITAL_COMMUNITY): Payer: Medicare Other

## 2022-11-12 ENCOUNTER — Emergency Department (HOSPITAL_COMMUNITY): Payer: Medicare Other

## 2022-11-12 ENCOUNTER — Telehealth: Payer: Self-pay | Admitting: *Deleted

## 2022-11-12 ENCOUNTER — Telehealth: Payer: Self-pay

## 2022-11-12 ENCOUNTER — Inpatient Hospital Stay (HOSPITAL_COMMUNITY)
Admission: EM | Admit: 2022-11-12 | Discharge: 2022-11-16 | DRG: 640 | Disposition: A | Discharge status: Hospice | Payer: Medicare Other | Attending: Internal Medicine | Admitting: Internal Medicine

## 2022-11-12 ENCOUNTER — Other Ambulatory Visit: Payer: Self-pay

## 2022-11-12 ENCOUNTER — Encounter: Payer: Self-pay | Admitting: *Deleted

## 2022-11-12 ENCOUNTER — Encounter (HOSPITAL_COMMUNITY): Payer: Self-pay

## 2022-11-12 DIAGNOSIS — N179 Acute kidney failure, unspecified: Secondary | ICD-10-CM | POA: Diagnosis not present

## 2022-11-12 DIAGNOSIS — G9341 Metabolic encephalopathy: Secondary | ICD-10-CM | POA: Diagnosis present

## 2022-11-12 DIAGNOSIS — Z833 Family history of diabetes mellitus: Secondary | ICD-10-CM

## 2022-11-12 DIAGNOSIS — E440 Moderate protein-calorie malnutrition: Secondary | ICD-10-CM | POA: Diagnosis present

## 2022-11-12 DIAGNOSIS — E039 Hypothyroidism, unspecified: Secondary | ICD-10-CM | POA: Diagnosis not present

## 2022-11-12 DIAGNOSIS — E871 Hypo-osmolality and hyponatremia: Secondary | ICD-10-CM | POA: Diagnosis present

## 2022-11-12 DIAGNOSIS — I2510 Atherosclerotic heart disease of native coronary artery without angina pectoris: Secondary | ICD-10-CM | POA: Diagnosis present

## 2022-11-12 DIAGNOSIS — Z823 Family history of stroke: Secondary | ICD-10-CM

## 2022-11-12 DIAGNOSIS — B370 Candidal stomatitis: Secondary | ICD-10-CM | POA: Diagnosis present

## 2022-11-12 DIAGNOSIS — Z7982 Long term (current) use of aspirin: Secondary | ICD-10-CM

## 2022-11-12 DIAGNOSIS — R531 Weakness: Secondary | ICD-10-CM | POA: Diagnosis not present

## 2022-11-12 DIAGNOSIS — E785 Hyperlipidemia, unspecified: Secondary | ICD-10-CM | POA: Diagnosis present

## 2022-11-12 DIAGNOSIS — Z66 Do not resuscitate: Secondary | ICD-10-CM | POA: Diagnosis present

## 2022-11-12 DIAGNOSIS — Z923 Personal history of irradiation: Secondary | ICD-10-CM

## 2022-11-12 DIAGNOSIS — Z7401 Bed confinement status: Secondary | ICD-10-CM | POA: Diagnosis not present

## 2022-11-12 DIAGNOSIS — Z79899 Other long term (current) drug therapy: Secondary | ICD-10-CM

## 2022-11-12 DIAGNOSIS — N1831 Chronic kidney disease, stage 3a: Secondary | ICD-10-CM | POA: Diagnosis present

## 2022-11-12 DIAGNOSIS — M8458XA Pathological fracture in neoplastic disease, other specified site, initial encounter for fracture: Secondary | ICD-10-CM | POA: Diagnosis present

## 2022-11-12 DIAGNOSIS — Z1152 Encounter for screening for COVID-19: Secondary | ICD-10-CM | POA: Diagnosis not present

## 2022-11-12 DIAGNOSIS — Z880 Allergy status to penicillin: Secondary | ICD-10-CM

## 2022-11-12 DIAGNOSIS — D6959 Other secondary thrombocytopenia: Secondary | ICD-10-CM | POA: Diagnosis present

## 2022-11-12 DIAGNOSIS — Z79890 Hormone replacement therapy (postmenopausal): Secondary | ICD-10-CM

## 2022-11-12 DIAGNOSIS — I129 Hypertensive chronic kidney disease with stage 1 through stage 4 chronic kidney disease, or unspecified chronic kidney disease: Secondary | ICD-10-CM | POA: Diagnosis present

## 2022-11-12 DIAGNOSIS — C7951 Secondary malignant neoplasm of bone: Secondary | ICD-10-CM | POA: Diagnosis not present

## 2022-11-12 DIAGNOSIS — J449 Chronic obstructive pulmonary disease, unspecified: Secondary | ICD-10-CM | POA: Diagnosis not present

## 2022-11-12 DIAGNOSIS — M47814 Spondylosis without myelopathy or radiculopathy, thoracic region: Secondary | ICD-10-CM | POA: Diagnosis not present

## 2022-11-12 DIAGNOSIS — E860 Dehydration: Secondary | ICD-10-CM | POA: Diagnosis present

## 2022-11-12 DIAGNOSIS — R4182 Altered mental status, unspecified: Secondary | ICD-10-CM | POA: Diagnosis not present

## 2022-11-12 DIAGNOSIS — R059 Cough, unspecified: Secondary | ICD-10-CM | POA: Diagnosis not present

## 2022-11-12 DIAGNOSIS — R627 Adult failure to thrive: Secondary | ICD-10-CM | POA: Diagnosis present

## 2022-11-12 DIAGNOSIS — N183 Chronic kidney disease, stage 3 unspecified: Secondary | ICD-10-CM | POA: Diagnosis not present

## 2022-11-12 DIAGNOSIS — M4854XA Collapsed vertebra, not elsewhere classified, thoracic region, initial encounter for fracture: Secondary | ICD-10-CM | POA: Diagnosis not present

## 2022-11-12 DIAGNOSIS — Z87891 Personal history of nicotine dependence: Secondary | ICD-10-CM

## 2022-11-12 DIAGNOSIS — C154 Malignant neoplasm of middle third of esophagus: Secondary | ICD-10-CM | POA: Diagnosis present

## 2022-11-12 DIAGNOSIS — M4802 Spinal stenosis, cervical region: Secondary | ICD-10-CM | POA: Diagnosis present

## 2022-11-12 DIAGNOSIS — M4316 Spondylolisthesis, lumbar region: Secondary | ICD-10-CM | POA: Diagnosis not present

## 2022-11-12 DIAGNOSIS — C61 Malignant neoplasm of prostate: Secondary | ICD-10-CM | POA: Diagnosis not present

## 2022-11-12 DIAGNOSIS — M549 Dorsalgia, unspecified: Secondary | ICD-10-CM | POA: Diagnosis not present

## 2022-11-12 DIAGNOSIS — Z800 Family history of malignant neoplasm of digestive organs: Secondary | ICD-10-CM

## 2022-11-12 DIAGNOSIS — Z888 Allergy status to other drugs, medicaments and biological substances status: Secondary | ICD-10-CM

## 2022-11-12 DIAGNOSIS — M47816 Spondylosis without myelopathy or radiculopathy, lumbar region: Secondary | ICD-10-CM | POA: Diagnosis not present

## 2022-11-12 DIAGNOSIS — R32 Unspecified urinary incontinence: Secondary | ICD-10-CM | POA: Diagnosis present

## 2022-11-12 DIAGNOSIS — Z515 Encounter for palliative care: Secondary | ICD-10-CM

## 2022-11-12 DIAGNOSIS — Z9221 Personal history of antineoplastic chemotherapy: Secondary | ICD-10-CM

## 2022-11-12 DIAGNOSIS — M47817 Spondylosis without myelopathy or radiculopathy, lumbosacral region: Secondary | ICD-10-CM | POA: Diagnosis not present

## 2022-11-12 DIAGNOSIS — M47812 Spondylosis without myelopathy or radiculopathy, cervical region: Secondary | ICD-10-CM | POA: Diagnosis not present

## 2022-11-12 DIAGNOSIS — R55 Syncope and collapse: Secondary | ICD-10-CM | POA: Diagnosis not present

## 2022-11-12 DIAGNOSIS — I700 Atherosclerosis of aorta: Secondary | ICD-10-CM | POA: Diagnosis not present

## 2022-11-12 DIAGNOSIS — K219 Gastro-esophageal reflux disease without esophagitis: Secondary | ICD-10-CM | POA: Diagnosis present

## 2022-11-12 DIAGNOSIS — W19XXXA Unspecified fall, initial encounter: Secondary | ICD-10-CM | POA: Diagnosis not present

## 2022-11-12 DIAGNOSIS — Z8042 Family history of malignant neoplasm of prostate: Secondary | ICD-10-CM

## 2022-11-12 DIAGNOSIS — Z043 Encounter for examination and observation following other accident: Secondary | ICD-10-CM | POA: Diagnosis not present

## 2022-11-12 LAB — COMPREHENSIVE METABOLIC PANEL
ALT: 12 U/L (ref 0–44)
AST: 39 U/L (ref 15–41)
Albumin: 3.8 g/dL (ref 3.5–5.0)
Alkaline Phosphatase: 114 U/L (ref 38–126)
Anion gap: 12 (ref 5–15)
BUN: 30 mg/dL — ABNORMAL HIGH (ref 8–23)
CO2: 24 mmol/L (ref 22–32)
Calcium: 12.2 mg/dL — ABNORMAL HIGH (ref 8.9–10.3)
Chloride: 92 mmol/L — ABNORMAL LOW (ref 98–111)
Creatinine, Ser: 2.12 mg/dL — ABNORMAL HIGH (ref 0.61–1.24)
GFR, Estimated: 30 mL/min — ABNORMAL LOW (ref >60)
Glucose, Bld: 107 mg/dL — ABNORMAL HIGH (ref 70–99)
Potassium: 3.9 mmol/L (ref 3.5–5.1)
Sodium: 128 mmol/L — ABNORMAL LOW (ref 135–145)
Total Bilirubin: 0.5 mg/dL (ref 0.3–1.2)
Total Protein: 8.3 g/dL — ABNORMAL HIGH (ref 6.5–8.1)

## 2022-11-12 LAB — CBC WITH DIFFERENTIAL/PLATELET
Abs Immature Granulocytes: 0.35 10*3/uL — ABNORMAL HIGH (ref 0.00–0.07)
Basophils Absolute: 0 10*3/uL (ref 0.0–0.1)
Basophils Relative: 1 %
Eosinophils Absolute: 0 10*3/uL (ref 0.0–0.5)
Eosinophils Relative: 0 %
HCT: 31.5 % — ABNORMAL LOW (ref 39.0–52.0)
Hemoglobin: 10.4 g/dL — ABNORMAL LOW (ref 13.0–17.0)
Immature Granulocytes: 7 %
Lymphocytes Relative: 9 %
Lymphs Abs: 0.4 10*3/uL — ABNORMAL LOW (ref 0.7–4.0)
MCH: 31 pg (ref 26.0–34.0)
MCHC: 33 g/dL (ref 30.0–36.0)
MCV: 93.8 fL (ref 80.0–100.0)
Monocytes Absolute: 0.5 10*3/uL (ref 0.1–1.0)
Monocytes Relative: 9 %
Neutro Abs: 3.8 10*3/uL (ref 1.7–7.7)
Neutrophils Relative %: 74 %
Platelets: 120 10*3/uL — ABNORMAL LOW (ref 150–400)
RBC: 3.36 MIL/uL — ABNORMAL LOW (ref 4.22–5.81)
RDW: 14.1 % (ref 11.5–15.5)
WBC: 5.1 10*3/uL (ref 4.0–10.5)
nRBC: 0 % (ref 0.0–0.2)

## 2022-11-12 LAB — TROPONIN I (HIGH SENSITIVITY)
Troponin I (High Sensitivity): 31 ng/L — ABNORMAL HIGH (ref <18)
Troponin I (High Sensitivity): 32 ng/L — ABNORMAL HIGH (ref <18)

## 2022-11-12 LAB — RESP PANEL BY RT-PCR (RSV, FLU A&B, COVID)  RVPGX2
Influenza A by PCR: NEGATIVE
Influenza B by PCR: NEGATIVE
Resp Syncytial Virus by PCR: NEGATIVE
SARS Coronavirus 2 by RT PCR: NEGATIVE

## 2022-11-12 LAB — LIPASE, BLOOD: Lipase: 26 U/L (ref 11–51)

## 2022-11-12 MED ORDER — SODIUM CHLORIDE 0.9 % IV SOLN: Freq: Once | INTRAVENOUS | Status: AC

## 2022-11-12 MED ORDER — SODIUM CHLORIDE 0.9 % IV BOLUS
500.0000 mL | Freq: Once | INTRAVENOUS | Status: AC
  Administered 2022-11-12: 500 mL via INTRAVENOUS

## 2022-11-12 MED ORDER — ASPIRIN 81 MG PO TBEC
81.0000 mg | DELAYED_RELEASE_TABLET | Freq: Every day | ORAL | Status: DC
  Administered 2022-11-13 – 2022-11-16 (×4): 81 mg via ORAL
  Filled 2022-11-12 (×4): qty 1

## 2022-11-12 MED ORDER — LEVOTHYROXINE SODIUM 75 MCG PO TABS
150.0000 ug | ORAL_TABLET | Freq: Every day | ORAL | Status: DC
  Administered 2022-11-13 – 2022-11-16 (×4): 150 ug via ORAL
  Filled 2022-11-12 (×4): qty 2

## 2022-11-12 MED ORDER — SIMVASTATIN 20 MG PO TABS
10.0000 mg | ORAL_TABLET | Freq: Every day | ORAL | Status: DC
  Administered 2022-11-13 – 2022-11-16 (×4): 10 mg via ORAL
  Filled 2022-11-12 (×4): qty 1

## 2022-11-12 MED ORDER — GADOBUTROL 1 MMOL/ML IV SOLN
8.0000 mL | Freq: Once | INTRAVENOUS | Status: AC | PRN
  Administered 2022-11-12: 8 mL via INTRAVENOUS

## 2022-11-12 MED ORDER — LATANOPROST 0.005 % OP SOLN
1.0000 [drp] | Freq: Every day | OPHTHALMIC | Status: DC
  Administered 2022-11-13 – 2022-11-15 (×4): 1 [drp] via OPHTHALMIC
  Filled 2022-11-12: qty 2.5

## 2022-11-12 NOTE — Assessment & Plan Note (Signed)
Cont synthroid Recheck TSH and T4

## 2022-11-12 NOTE — Assessment & Plan Note (Addendum)
IVF NS Not severe enough to require calcitonin Per Dr. Candise Che: no zometa for the moment given renal fxn Dr. Truett Perna will decide on Rivka Barbara / Aredia

## 2022-11-12 NOTE — Assessment & Plan Note (Addendum)
With widespread bone mets seen recently on imaging studies.  Referred back to Onc on 16th (See. Dr. Truett Perna office note from then). Message sent to Dr. Truett Perna and Dr. Candise Che (on-call for onc). Will hold and defer re-ordering home Relugolix to them. Looks like the apalutamide was [facility] by Dr. Truett Perna during that 8/16 visit, but looks like its still on med rec?  Will hold. Generalized weakness, will r/o cord involvement with MR spine mets screening protocol.

## 2022-11-12 NOTE — ED Notes (Signed)
ED TO INPATIENT HANDOFF REPORT  Name/Age/Gender Aaron [name] 87 y.o. male  Code Status   Home/SNF/Other Home  Chief Complaint Hypercalcemia [E83.52]  Level of Care/Admitting Diagnosis ED Disposition     ED Disposition  Admit   Condition  --   Comment  Hospital Area: [facility] [[id number]]  Level of Care: Telemetry [5]  Admit to tele based on following criteria: Complex arrhythmia (Bradycardia/Tachycardia)  May place patient in observation at [facility] or [facility] if equivalent level of care is available:: No  Covid Evaluation: Asymptomatic - no recent exposure (last 10 days) testing not required  Diagnosis: Hypercalcemia [275.42.ICD-9-CM]  Admitting Physician: Hillary Bow [[id number]]  Attending Physician: Hillary Bow [phone]          Medical History 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, now removed    Esophageal stricture    Gastritis    GERD (gastroesophageal reflux disease)    Hemorrhoids    Hiatal hernia    History of chemotherapy 05/03/09-06/16/09   Taxol/carboplatin   History of nuclear stress test 07/2008   exercise; mild-mod perfusion dfect in basal inf/mid inf/apical inf regions; EKG negative for ischemia   History of radiation therapy 05/03/09-06/16/09   squamous cell ca of esophagus   Hyperlipidemia    Dr Tresa Endo   Hypothyroid    s/p radiotherapy   Prostate cancer [facility])     Allergies Allergies  Allergen Reactions   Amoxicillin     REACTION: rash   Cefuroxime Axetil     REACTION: nit feeling well   Fluconazole     REACTION: shaking   Escitalopram Other (See Comments)    Nightmares, numbness in tongue, dizziness    IV  Location/Drains/Wounds Patient Lines/Drains/Airways Status     Active Line/Drains/Airways     Name Placement date Placement time Site Days   Peripheral IV 11/12/22 Left Antecubital 11/12/22  1700  Antecubital  less than 1            Labs/Imaging Results for orders placed or performed during the hospital encounter of 11/12/22 (from the past 48 hour(s))  CBC with Differential     Status: Abnormal   Collection Time: 11/12/22  6:25 PM  Result Value Ref Range   WBC 5.1 4.0 - 10.5 K/uL   RBC 3.36 (L) 4.22 - 5.81 MIL/uL   Hemoglobin 10.4 (L) 13.0 - 17.0 g/dL   HCT 54.0 (L) 98.1 - 19.1 %   MCV 93.8 80.0 - 100.0 fL   MCH 31.0 26.0 - 34.0 pg   MCHC 33.0 30.0 - 36.0 g/dL   RDW 47.8 29.5 - 62.1 %   Platelets 120 (L) 150 - 400 K/uL    Comment: SPECIMEN CHECKED FOR CLOTS REPEATED TO VERIFY    nRBC 0.0 0.0 - 0.2 %   Neutrophils Relative % 74 %   Neutro Abs 3.8 1.7 - 7.7 K/uL   Lymphocytes Relative 9 %   Lymphs Abs 0.4 (L) 0.7 - 4.0 K/uL   Monocytes Relative 9 %   Monocytes Absolute 0.5 0.1 - 1.0 K/uL   Eosinophils Relative 0 %   Eosinophils  Absolute 0.0 0.0 - 0.5 K/uL   Basophils Relative 1 %   Basophils Absolute 0.0 0.0 - 0.1 K/uL   Immature Granulocytes 7 %   Abs Immature Granulocytes 0.35 (H) 0.00 - 0.07 K/uL    Comment: Performed at [facility], [address] [place], [place] [address]  Comprehensive metabolic panel     Status: Abnormal   Collection Time: 11/12/22  6:25 PM  Result Value Ref Range   Sodium 128 (L) 135 - 145 mmol/L   Potassium 3.9 3.5 - 5.1 mmol/L   Chloride 92 (L) 98 - 111 mmol/L   CO2 24 22 - 32 mmol/L   Glucose, Bld 107 (H) 70 - 99 mg/dL    Comment: Glucose reference range applies only to samples taken after fasting for at least 8 hours.   BUN 30 (H) 8 - 23 mg/dL   Creatinine, Ser 6.21 (H) 0.61 - 1.24 mg/dL   Calcium 30.8 (H) 8.9 - 10.3 mg/dL   Total Protein 8.3 (H) 6.5 - 8.1 g/dL   Albumin 3.8 3.5 - 5.0 g/dL   AST 39 15 - 41 U/L    ALT 12 0 - 44 U/L   Alkaline Phosphatase 114 38 - 126 U/L   Total Bilirubin 0.5 0.3 - 1.2 mg/dL   GFR, Estimated 30 (L) >60 mL/min    Comment: (NOTE) Calculated using the CKD-EPI Creatinine Equation (2021)    Anion gap 12 5 - 15    Comment: Performed at [facility], [address], [place], [place] [address]  Lipase, blood     Status: None   Collection Time: 11/12/22  6:25 PM  Result Value Ref Range   Lipase 26 11 - 51 U/L    Comment: Performed at [facility], [address] [place], [place] [address]  Troponin I (High Sensitivity)     Status: Abnormal   Collection Time: 11/12/22  6:25 PM  Result Value Ref Range   Troponin I (High Sensitivity) 31 (H) <18 ng/L    Comment: (NOTE) Elevated high sensitivity troponin I (hsTnI) values and significant  changes across serial measurements may suggest ACS but many other  chronic and acute conditions are known to elevate hsTnI results.  Refer to the "Links" section for chest pain algorithms and additional  guidance. Performed at [facility], [address], [place], [place] [address]   Resp panel by RT-PCR (RSV, Flu A&B, Covid) Anterior Nasal Swab     Status: None   Collection Time: 11/12/22  6:25 PM   Specimen: Anterior Nasal Swab  Result Value Ref Range   SARS Coronavirus 2 by RT PCR NEGATIVE NEGATIVE    Comment: (NOTE) SARS-CoV-2 target nucleic acids are NOT DETECTED.  The SARS-CoV-2 RNA is generally detectable in upper respiratory specimens during the acute phase of infection. The lowest concentration of SARS-CoV-2 viral copies this assay can detect is 138 copies/mL. A negative result does not preclude SARS-Cov-2 infection and should not be used as the sole basis for treatment or other patient management decisions. A negative result may occur with  improper specimen collection/handling, submission of specimen other than nasopharyngeal swab, presence of viral  mutation(s) within the areas targeted by this assay, and inadequate number of viral copies(<138 copies/mL). A negative result must be combined with clinical observations, patient history, and epidemiological information. The expected result is Negative.  Fact Sheet for Patients:  BloggerCourse.com  Fact Sheet for Healthcare Providers:  SeriousBroker.it  This test  is no t yet approved or cleared by the [place] and  has been authorized for detection and/or diagnosis of SARS-CoV-2 by FDA under an Emergency Use Authorization (EUA). This EUA will remain  in effect (meaning this test can be used) for the duration of the COVID-19 declaration under Section 564(b)(1) of the Act, 21 U.S.C.section 360bbb-3(b)(1), unless the authorization is terminated  or revoked sooner.       Influenza A by PCR NEGATIVE NEGATIVE   Influenza B by PCR NEGATIVE NEGATIVE    Comment: (NOTE) The Xpert Xpress SARS-CoV-2/FLU/RSV plus assay is intended as an aid in the diagnosis of influenza from Nasopharyngeal swab specimens and should not be used as a sole basis for treatment. Nasal washings and aspirates are unacceptable for Xpert Xpress SARS-CoV-2/FLU/RSV testing.  Fact Sheet for Patients: BloggerCourse.com  Fact Sheet for Healthcare Providers: SeriousBroker.it  This test is not yet approved or cleared by the [place] FDA and has been authorized for detection and/or diagnosis of SARS-CoV-2 by FDA under an Emergency Use Authorization (EUA). This EUA will remain in effect (meaning this test can be used) for the duration of the COVID-19 declaration under Section 564(b)(1) of the Act, 21 U.S.C. section 360bbb-3(b)(1), unless the authorization is terminated or revoked.     Resp Syncytial Virus by PCR NEGATIVE NEGATIVE    Comment: (NOTE) Fact Sheet for  Patients: BloggerCourse.com  Fact Sheet for Healthcare Providers: SeriousBroker.it  This test is not yet approved or cleared by the [place] FDA and has been authorized for detection and/or diagnosis of SARS-CoV-2 by FDA under an Emergency Use Authorization (EUA). This EUA will remain in effect (meaning this test can be used) for the duration of the COVID-19 declaration under Section 564(b)(1) of the Act, 21 U.S.C. section 360bbb-3(b)(1), unless the authorization is terminated or revoked.  Performed at [facility], [address], [place], [place] [address]   Troponin I (High Sensitivity)     Status: Abnormal   Collection Time: 11/12/22  8:18 PM  Result Value Ref Range   Troponin I (High Sensitivity) 32 (H) <18 ng/L    Comment: (NOTE) Elevated high sensitivity troponin I (hsTnI) values and significant  changes across serial measurements may suggest ACS but many other  chronic and acute conditions are known to elevate hsTnI results.  Refer to the "Links" section for chest pain algorithms and additional  guidance. Performed at [facility], [address] [place], [place] [address]    CT Lumbar Spine Wo Contrast  Result Date: 11/12/2022 CLINICAL DATA:  Fall, back pain EXAM: CT LUMBAR SPINE WITHOUT CONTRAST TECHNIQUE: Multidetector CT imaging of the lumbar spine was performed without intravenous contrast administration. Multiplanar CT image reconstructions were also generated. RADIATION DOSE REDUCTION: This exam was performed according to the departmental dose-optimization program which includes automated exposure control, adjustment of the mA and/or kV according to patient size and/or use of iterative reconstruction technique. COMPARISON:  Plain films today. FINDINGS: Segmentation: 5 lumbar type vertebrae. Alignment: 4 mm anterolisthesis of L4 on L5 related to degenerative facet disease.  Vertebrae: Slight compression fracture the superior endplate of T12. This is age indeterminate. No additional fracture. Diffuse osteopenia. Paraspinal and other soft tissues: Paraspinal soft tissues negative. Large posterior bladder wall diverticulum on the right. Aortic atherosclerosis without aneurysm. Bilateral renal cysts. Disc levels: Advanced degenerative facet disease, most pronounced from L3-4 through L5-S1. no disc herniation. IMPRESSION: Slight compression fracture through the anterior superior endplate of T12, age indeterminate.  Degenerative changes, most pronounced in the facets from L3-4 through L5-S1. Electronically Signed   By: Charlett Nose M.D.   On: 11/12/2022 19:21   DG Lumbar Spine 2-3 Views  Result Date: 11/12/2022 CLINICAL DATA:  Fall EXAM: LUMBAR SPINE - 2-3 VIEW COMPARISON:  None Available. FINDINGS: There is 4 mm of anterolisthesis at L4-L5 which is favored as degenerative. Alignment is otherwise anatomic. There is no acute fracture. There significant degenerative changes of facet joints throughout the lumbar spine. There are atherosclerotic calcifications of the aorta. IMPRESSION: 1. No acute fracture. 2. Grade 1 anterolisthesis at L4-L5 is favored as degenerative. Electronically Signed   By: Darliss Cheney M.D.   On: 11/12/2022 19:09   DG Pelvis 1-2 Views  Result Date: 11/12/2022 CLINICAL DATA:  Fall EXAM: PELVIS - 1-2 VIEW COMPARISON:  None Available. FINDINGS: Hip joints and SI joints symmetric. No acute bony abnormality. Specifically, no fracture, subluxation, or dislocation. IMPRESSION: No acute bony abnormality. Electronically Signed   By: Charlett Nose M.D.   On: 11/12/2022 19:07   CT Head Wo Contrast  Result Date: 11/12/2022 CLINICAL DATA:  Mental status changes.  Fall. EXAM: CT HEAD WITHOUT CONTRAST TECHNIQUE: Contiguous axial images were obtained from the base of the skull through the vertex without intravenous contrast. RADIATION DOSE REDUCTION: This exam was performed  according to the departmental dose-optimization program which includes automated exposure control, adjustment of the mA and/or kV according to patient size and/or use of iterative reconstruction technique. COMPARISON:  None Available. FINDINGS: Brain: There is atrophy and chronic small vessel disease changes. No acute intracranial abnormality. Specifically, no hemorrhage, hydrocephalus, mass lesion, acute infarction, or significant intracranial injury. Vascular: No hyperdense vessel or unexpected calcification. Skull: No acute calvarial abnormality. Sinuses/Orbits: No acute findings Other: None IMPRESSION: Atrophy, chronic microvascular disease. No acute intracranial abnormality. Electronically Signed   By: Charlett Nose M.D.   On: 11/12/2022 19:03   DG Chest Port 1 View  Result Date: 11/12/2022 CLINICAL DATA:  Fall, weakness.  History of COPD, former smoker. EXAM: PORTABLE CHEST 1 VIEW COMPARISON:  04/20/2020 FINDINGS: The heart size and mediastinal contours are within normal limits. Both lungs are clear. The visualized skeletal structures are unremarkable. IMPRESSION: No active disease. Electronically Signed   By: Charlett Nose M.D.   On: 11/12/2022 19:03    Pending Labs Unresulted Labs (From admission, onward)     Start     Ordered   11/13/22 0500  Comprehensive metabolic panel  Tomorrow morning,   R        11/12/22 2154   11/12/22 2134  TSH  Once,   URGENT        11/12/22 2133   11/12/22 2134  T4, free  Once,   URGENT        11/12/22 2133   11/12/22 1715  Urinalysis, Routine w reflex microscopic -Urine, Clean Catch  Once,   URGENT       Question:  Specimen Source  Answer:  Urine, Clean Catch   11/12/22 1714            Vitals/Pain Today's Vitals   11/12/22 1700 11/12/22 1855 11/12/22 2000  BP: 103/71  111/65  Pulse: 90  78  Resp: 20  19  Temp: 97.6 F (36.4 C)  97.6 F (36.4 C)  SpO2: 94%  95%  Weight:  178 lb 9.2 oz (81 kg)   Height:  6\' 3"  (1.905 m)   PainSc:  0-No pain      Isolation  Precautions No active isolations  Medications Medications  levothyroxine (SYNTHROID) tablet 150 mcg (has no administration in time range)  sodium chloride 0.9 % bolus 500 mL (0 mLs Intravenous Stopped 11/12/22 1857)  0.9 %  sodium chloride infusion ( Intravenous New Bag/Given 11/12/22 2143)    Mobility non-ambulatory

## 2022-11-12 NOTE — Telephone Encounter (Signed)
Spoke with pt's wife per MD Truett Perna and verbalized his recommendation to go to the ED to be evaluated for possible cord compression. Pt's wife verbalizes understanding and agrees with plan of care.

## 2022-11-12 NOTE — Assessment & Plan Note (Signed)
"  Remote" history of.  Despite Stage 4 and inoperable, apparently was successfully treated back in 2011 into long term remission with chemo + radiation from what I can tell?

## 2022-11-12 NOTE — H&P (Signed)
History and Physical    Patient: Aaron [name] ION:[id number] DOB: 03/18/1936 DOA: 11/12/2022 DOS: the patient was seen and examined on 11/12/2022 PCP: Plotnikov, Georgina Quint, MD  Patient coming from: Home  Chief Complaint: Weakness  HPI: [name] is a 87 y.o. male with medical history significant of Esophageal CA treated in 2011 with chemo/radiation, inoperable but apparently long term remission? Prostate CA June 2022.  Radiation therapy and hormone therapy.  Prostate CA has recently progressed and now has widespread bony metastatic disease as of 10/18/22.  Pt saw Dr. Truett Perna again 8/16: Dr. Truett Perna felt pt symptomatic with failure to thrive and declining performance status.  Didn't appear to be a candidate for docetaxel.  He recommend he discontinue the apalutamide. He will continue Relugolix.  Pt referred for consideration of Pluvicto.  Noted small chance of clinical response with second line antiandrogen therapy.  Since that visit he has progressive weakness, BLE.  Slid to floor x 2 this weekend trying to get to bathroom   Pt in to ED.  Pt with urinary incontinence but this is not new.  No BM this weekend.  Dr. Rodena Medin ordered CT of L spine and head which wernt really impressive other than for an age indeterminate compression fx.  Dr. Rodena Medin reports to me that he feels cord compression is unlikely and not c/w patients exam findings, rather he reports he thinks that patient's weakness is due to metabolic abnormalities.   Review of Systems: As mentioned in the history of present illness. All other systems reviewed and are negative. 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, now removed     Esophageal stricture    Gastritis    GERD (gastroesophageal reflux disease)    Hemorrhoids    Hiatal hernia    History of chemotherapy 05/03/09-06/16/09   Taxol/carboplatin   History of nuclear stress test 07/2008   exercise; mild-mod perfusion dfect in basal inf/mid inf/apical inf regions; EKG negative for ischemia   History of radiation therapy 05/03/09-06/16/09   squamous cell ca of esophagus   Hyperlipidemia    Dr Tresa Endo   Hypothyroid    s/p radiotherapy   Prostate cancer [facility])    Past Surgical History:  Procedure Laterality Date   CARDIAC CATHETERIZATION  08/2008   mild nonobstructive CAD with 30% LAD stenosis prox & 20% OM1 narrowing    COLONOSCOPY     CREATION OF CUTANEOUS STOMA  2011   ESOPHAGOGASTRODUODENOSCOPY  10/03/2011   Procedure: ESOPHAGOGASTRODUODENOSCOPY (EGD);  Surgeon: Meryl Dare, MD,FACG;  Location: [facility];  Service: Endoscopy;  Laterality: N/A;  no fluro needed   Lower Back Cyst Removed     [facility]   POLYPECTOMY     [name] DILATION  10/03/2011   Procedure: [name] DILATION;  Surgeon: Meryl Dare, MD,FACG;  Location: [facility];  Service: Endoscopy;  Laterality: N/A;   TONSILLECTOMY     age 71   TRANSTHORACIC ECHOCARDIOGRAM  06/2009   EF=>55%; trace MR/TR   UPPER GASTROINTESTINAL ENDOSCOPY     Social History:  reports that he quit smoking about 21 years ago. His smoking use included cigarettes. He started smoking  about 61 years ago. He has a 40 pack-year smoking history. He has never used smokeless tobacco. He reports current alcohol use. He reports that he does not use drugs.  Allergies  Allergen Reactions   Amoxicillin     REACTION: rash   Cefuroxime Axetil     REACTION: nit feeling well   Fluconazole     REACTION: shaking   Escitalopram Other (See Comments)    Nightmares, numbness in tongue, dizziness    Family History  Problem Relation Age of Onset   Prostate cancer Father        28's   Diabetes Father    Stroke Mother         aneurism   Stroke Maternal Grandfather 55   Prostate cancer Brother 71   Colon polyps Brother    Prostate cancer Brother 70   Colon cancer Sister    Coronary artery disease Neg Hx    Hypertension Neg Hx    Stomach cancer Neg Hx    Rectal cancer Neg Hx     Prior to Admission medications   Medication Sig Start Date End Date Taking? Authorizing Provider  apalutamide (ERLEADA) 60 MG tablet Take 240 mg by mouth at bedtime. May be taken with or without food. Swallow tablets whole.   Yes [provider]  aspirin 81 MG EC tablet Take 81 mg by mouth daily.   Yes [provider]  clonazePAM (KLONOPIN) 1 MG tablet TAKE 1/2 TO 1 TABLET BY MOUTH  TWICE DAILY AS NEEDED FOR  ANXIETY 06/05/22  Yes Plotnikov, Georgina Quint, MD  latanoprost (XALATAN) 0.005 % ophthalmic solution Place 1 drop into the left eye at bedtime. 10/24/21  Yes [provider]  levothyroxine (SYNTHROID) 150 MCG tablet Take 1 tablet (150 mcg total) by mouth daily. 09/11/22  Yes Plotnikov, Georgina Quint, MD  metoprolol succinate (TOPROL-XL) 25 MG 24 hr tablet TAKE ONE-HALF TABLET BY MOUTH  ALTERNATING WITH 1/4 OF A TABLET BY MOUTH DAILY 05/17/22  Yes Lennette Bihari, MD  simvastatin (ZOCOR) 20 MG tablet Take 0.5 tablets (10 mg total) by mouth daily. 05/21/22  Yes Plotnikov, Georgina Quint, MD  Dextromethorphan-guaiFENesin 20-400 MG TABS Take 1 tablet by mouth daily as needed.     [provider]  docusate sodium (COLACE) 100 MG capsule Take 100 mg by mouth 2 (two) times daily.    [provider]  finasteride (PROSCAR) 5 MG tablet TAKE 1 TABLET BY MOUTH  DAILY 08/19/20   Plotnikov, Georgina Quint, MD  omeprazole (PRILOSEC) 40 MG capsule TAKE 1 CAPSULE BY MOUTH DAILY 08/07/22   Meryl Dare, MD  Relugolix (ORGOVYX) 120 MG TABS 1 po qd Patient taking differently: 2 pills in AM 12/14/20   Plotnikov, Georgina Quint, MD    Physical Exam: Vitals:   11/12/22 1700 11/12/22 1855 11/12/22 2000  BP: 103/71  111/65  Pulse: 90   78  Resp: 20  19  Temp: 97.6 F (36.4 C)  97.6 F (36.4 C)  SpO2: 94%  95%  Weight:  81 kg   Height:  6\' 3"  (1.905 m)    Constitutional: NAD, calm, comfortable Respiratory: clear to auscultation bilaterally, no wheezing, no crackles. Normal respiratory effort. No accessory muscle use.  Cardiovascular: Regular rate and rhythm, no murmurs / rubs / gallops. No extremity edema. 2+ pedal pulses. No carotid bruits.  Abdomen: no tenderness, no masses palpated. No hepatosplenomegaly. Bowel sounds positive.  Neurologic: Grossly intact.  Has BLE muscle weakness, but no change  in sensation.  Does have slight L eyelid droop but wife says this is normal for him when he doesn't take his glaucoma drops. Psychiatric: Normal judgment and insight. Alert and oriented x 3. Normal mood.   Data Reviewed:    Labs on Admission: I have personally reviewed following labs and imaging studies  CBC: Recent Labs  Lab 11/12/22 1825  WBC 5.1  NEUTROABS 3.8  HGB 10.4*  HCT 31.5*  MCV 93.8  PLT 120*   Basic Metabolic Panel: Recent Labs  Lab 11/12/22 1825  NA 128*  K 3.9  CL 92*  CO2 24  GLUCOSE 107*  BUN 30*  CREATININE 2.12*  CALCIUM 12.2*   GFR: Estimated Creatinine Clearance: 29.7 mL/min (A) (by C-G formula based on SCr of 2.12 mg/dL (H)). Liver Function Tests: Recent Labs  Lab 11/12/22 1825  AST 39  ALT 12  ALKPHOS 114  BILITOT 0.5  PROT 8.3*  ALBUMIN 3.8   Recent Labs  Lab 11/12/22 1825  LIPASE 26   No results for input(s): "AMMONIA" in the last 168 hours. Coagulation Profile: No results for input(s): "INR", "PROTIME" in the last 168 hours. Cardiac Enzymes: No results for input(s): "CKTOTAL", "CKMB", "CKMBINDEX", "TROPONINI" in the last 168 hours. BNP (last 3 results) No results for input(s): "PROBNP" in the last 8760 hours. HbA1C: No results for input(s): "HGBA1C" in the last 72 hours. CBG: No results for input(s): "GLUCAP" in the last 168 hours. Lipid Profile: No  results for input(s): "CHOL", "HDL", "LDLCALC", "TRIG", "CHOLHDL", "LDLDIRECT" in the last 72 hours. Thyroid Function Tests: No results for input(s): "TSH", "T4TOTAL", "FREET4", "T3FREE", "THYROIDAB" in the last 72 hours. Anemia Panel: No results for input(s): "VITAMINB12", "FOLATE", "FERRITIN", "TIBC", "IRON", "RETICCTPCT" in the last 72 hours. Urine analysis:    Component Value Date/Time   COLORURINE YELLOW 11/09/2022 1524   APPEARANCEUR CLEAR 11/09/2022 1524   LABSPEC 1.018 11/09/2022 1524   LABSPEC 1.005 07/06/2009 1109   PHURINE 5.5 11/09/2022 1524   GLUCOSEU NEGATIVE 11/09/2022 1524   GLUCOSEU NEGATIVE 08/05/2014 0731   HGBUR NEGATIVE 11/09/2022 1524   BILIRUBINUR NEGATIVE 11/09/2022 1524   BILIRUBINUR Negative 07/06/2009 1109   KETONESUR NEGATIVE 11/09/2022 1524   PROTEINUR TRACE (A) 11/09/2022 1524   UROBILINOGEN 0.2 08/05/2014 0731   NITRITE NEGATIVE 11/09/2022 1524   LEUKOCYTESUR NEGATIVE 11/09/2022 1524   LEUKOCYTESUR Negative 07/06/2009 1109    Radiological Exams on Admission: CT Lumbar Spine Wo Contrast  Result Date: 11/12/2022 CLINICAL DATA:  Fall, back pain EXAM: CT LUMBAR SPINE WITHOUT CONTRAST TECHNIQUE: Multidetector CT imaging of the lumbar spine was performed without intravenous contrast administration. Multiplanar CT image reconstructions were also generated. RADIATION DOSE REDUCTION: This exam was performed according to the departmental dose-optimization program which includes automated exposure control, adjustment of the mA and/or kV according to patient size and/or use of iterative reconstruction technique. COMPARISON:  Plain films today. FINDINGS: Segmentation: 5 lumbar type vertebrae. Alignment: 4 mm anterolisthesis of L4 on L5 related to degenerative facet disease. Vertebrae: Slight compression fracture the superior endplate of T12. This is age indeterminate. No additional fracture. Diffuse osteopenia. Paraspinal and other soft tissues: Paraspinal soft tissues  negative. Large posterior bladder wall diverticulum on the right. Aortic atherosclerosis without aneurysm. Bilateral renal cysts. Disc levels: Advanced degenerative facet disease, most pronounced from L3-4 through L5-S1. no disc herniation. IMPRESSION: Slight compression fracture through the anterior superior endplate of T12, age indeterminate. Degenerative changes, most pronounced in the facets from L3-4 through L5-S1. Electronically Signed   By:  Charlett Nose M.D.   On: 11/12/2022 19:21   DG Lumbar Spine 2-3 Views  Result Date: 11/12/2022 CLINICAL DATA:  Fall EXAM: LUMBAR SPINE - 2-3 VIEW COMPARISON:  None Available. FINDINGS: There is 4 mm of anterolisthesis at L4-L5 which is favored as degenerative. Alignment is otherwise anatomic. There is no acute fracture. There significant degenerative changes of facet joints throughout the lumbar spine. There are atherosclerotic calcifications of the aorta. IMPRESSION: 1. No acute fracture. 2. Grade 1 anterolisthesis at L4-L5 is favored as degenerative. Electronically Signed   By: Darliss Cheney M.D.   On: 11/12/2022 19:09   DG Pelvis 1-2 Views  Result Date: 11/12/2022 CLINICAL DATA:  Fall EXAM: PELVIS - 1-2 VIEW COMPARISON:  None Available. FINDINGS: Hip joints and SI joints symmetric. No acute bony abnormality. Specifically, no fracture, subluxation, or dislocation. IMPRESSION: No acute bony abnormality. Electronically Signed   By: Charlett Nose M.D.   On: 11/12/2022 19:07   CT Head Wo Contrast  Result Date: 11/12/2022 CLINICAL DATA:  Mental status changes.  Fall. EXAM: CT HEAD WITHOUT CONTRAST TECHNIQUE: Contiguous axial images were obtained from the base of the skull through the vertex without intravenous contrast. RADIATION DOSE REDUCTION: This exam was performed according to the departmental dose-optimization program which includes automated exposure control, adjustment of the mA and/or kV according to patient size and/or use of iterative reconstruction  technique. COMPARISON:  None Available. FINDINGS: Brain: There is atrophy and chronic small vessel disease changes. No acute intracranial abnormality. Specifically, no hemorrhage, hydrocephalus, mass lesion, acute infarction, or significant intracranial injury. Vascular: No hyperdense vessel or unexpected calcification. Skull: No acute calvarial abnormality. Sinuses/Orbits: No acute findings Other: None IMPRESSION: Atrophy, chronic microvascular disease. No acute intracranial abnormality. Electronically Signed   By: Charlett Nose M.D.   On: 11/12/2022 19:03   DG Chest Port 1 View  Result Date: 11/12/2022 CLINICAL DATA:  Fall, weakness.  History of COPD, former smoker. EXAM: PORTABLE CHEST 1 VIEW COMPARISON:  04/20/2020 FINDINGS: The heart size and mediastinal contours are within normal limits. Both lungs are clear. The visualized skeletal structures are unremarkable. IMPRESSION: No active disease. Electronically Signed   By: Charlett Nose M.D.   On: 11/12/2022 19:03    EKG: Independently reviewed.   Assessment and Plan: * AKI (acute kidney injury) (HCC) AKI on CKD 3 due to hypercalcemia most likely. IVF Strict intake and output Repeat BMP in AM  Hypercalcemia IVF NS Not severe enough to require calcitonin Per Dr. Candise Che: no zometa for the moment given renal fxn Dr. Truett Perna will decide on Xgeva / Aredia  Malignant neoplasm of prostate [facility]) With widespread bone mets seen recently on imaging studies.  Referred back to Onc on 16th (See. Dr. Truett Perna office note from then). Message sent to Dr. Truett Perna and Dr. Candise Che (on-call for onc). Will hold and defer re-ordering home Relugolix to them. Looks like the apalutamide was [facility] by Dr. Truett Perna during that 8/16 visit, but looks like its still on med rec?  Will hold. Generalized weakness, will r/o cord involvement with MR spine mets screening protocol.  Hypothyroidism Cont synthroid Recheck TSH and T4  Malignant neoplasm of thoracic esophagus  (HCC) "Remote" history of.  Despite Stage 4 and inoperable, apparently was successfully treated back in 2011 into long term remission with chemo + radiation from what I can tell?      Advance Care Planning:   Code Status: Full Code  Consults: Message sent to Dr. Candise Che and Dr. Truett Perna  Family  Communication: Family at bedside  Severity of Illness: The appropriate patient status for this patient is OBSERVATION. Observation status is judged to be reasonable and necessary in order to provide the required intensity of service to ensure the patient's safety. The patient's presenting symptoms, physical exam findings, and initial radiographic and laboratory data in the context of their medical condition is felt to place them at decreased risk for further clinical deterioration. Furthermore, it is anticipated that the patient will be medically stable for discharge from the hospital within 2 midnights of admission.   Author: Hillary Bow., DO 11/12/2022 10:03 PM  For on call review [email or web address].

## 2022-11-12 NOTE — Assessment & Plan Note (Signed)
AKI on CKD 3 due to hypercalcemia most likely. IVF Strict intake and output Repeat BMP in AM

## 2022-11-12 NOTE — Telephone Encounter (Signed)
error 

## 2022-11-12 NOTE — Progress Notes (Signed)
Per spouse daughter [place] [phone]([phone]) [phone]) is POA.

## 2022-11-12 NOTE — Telephone Encounter (Signed)
Mrs. Piontek wanted MD aware that Mr. Bissonnette is getting weaker. He can barely move his legs and has slid to floor x 2 this weekend trying to take him to bathroom. Was not injured. He is incontinent of urine, which is not new. Has not had a BM this weekend. He is in no pain.  Inquired as to what her wishes were for him currently and she said for now she is comfortable with him at home on bedrest. She plans to obtain a bedpan in case he needs to have a bowel movement.

## 2022-11-12 NOTE — ED Triage Notes (Signed)
BIBA from home for fall, witnessed by wife. Patient was trying to get to the bathroom and fell. Denies LOC or head injury. Denies any other injury. AAOX4. NAD

## 2022-11-12 NOTE — ED Provider Notes (Signed)
[place] EMERGENCY DEPARTMENT AT [facility] Provider Note   CSN: [id number] Arrival date & time: 11/12/22  1646     History {Add pertinent medical, surgical, social history, OB history to HPI:1} No chief complaint on file.   Aaron [name] is a 87 y.o. male.  HPI     Home Medications Prior to Admission medications   Medication Sig Start Date End Date Taking? Authorizing Provider  apalutamide (ERLEADA) 60 MG tablet Take 240 mg by mouth as directed. Take 4 tablet at bedtime May be taken with or without food. Swallow tablets whole.    [provider]  aspirin 81 MG EC tablet Take 81 mg by mouth daily.    [provider]  clonazePAM (KLONOPIN) 1 MG tablet TAKE 1/2 TO 1 TABLET BY MOUTH  TWICE DAILY AS NEEDED FOR  ANXIETY 06/05/22   Plotnikov, Georgina Quint, MD  Dextromethorphan-guaiFENesin 20-400 MG TABS Take 1 tablet by mouth daily as needed.     [provider]  docusate sodium (COLACE) 100 MG capsule Take 100 mg by mouth 2 (two) times daily.    [provider]  finasteride (PROSCAR) 5 MG tablet TAKE 1 TABLET BY MOUTH  DAILY 08/19/20   Plotnikov, Georgina Quint, MD  latanoprost (XALATAN) 0.005 % ophthalmic solution Place 1 drop into the left eye at bedtime. Place 1 drop into the left eye at bedtime 10/24/21   [provider]  levothyroxine (SYNTHROID) 150 MCG tablet Take 1 tablet (150 mcg total) by mouth daily. 09/11/22   Plotnikov, Georgina Quint, MD  metoprolol succinate (TOPROL-XL) 25 MG 24 hr tablet TAKE ONE-HALF TABLET BY MOUTH  ALTERNATING WITH 1/4 OF A TABLET BY MOUTH DAILY 05/17/22   Lennette Bihari, MD  omeprazole (PRILOSEC) 40 MG capsule TAKE 1 CAPSULE BY MOUTH DAILY 08/07/22   Meryl Dare, MD  Relugolix (ORGOVYX) 120 MG TABS 1 po qd Patient taking differently: 2 pills in AM 12/14/20   Plotnikov, Georgina Quint, MD  simvastatin (ZOCOR) 20 MG tablet Take 0.5 tablets (10 mg total) by mouth daily. 05/21/22   Plotnikov, Georgina Quint, MD       Allergies    Amoxicillin, Cefuroxime axetil, Fluconazole, and Escitalopram    Review of Systems   Review of Systems  Physical Exam Updated Vital Signs BP 111/65   Pulse 78   Temp 97.6 F (36.4 C)   Resp 19   Ht 6\' 3"  (1.905 m)   Wt 81 kg   SpO2 95%   BMI 22.32 kg/m  Physical Exam  ED Results / Procedures / Treatments   Labs (all labs ordered are listed, but only abnormal results are displayed) Labs Reviewed  CBC WITH DIFFERENTIAL/PLATELET - Abnormal; Notable for the following components:      Result Value   RBC 3.36 (*)    Hemoglobin 10.4 (*)    HCT 31.5 (*)    Platelets 120 (*)    Lymphs Abs 0.4 (*)    Abs Immature Granulocytes 0.35 (*)    All other components within normal limits  COMPREHENSIVE METABOLIC PANEL - Abnormal; Notable for the following components:   Sodium 128 (*)    Chloride 92 (*)    Glucose, Bld 107 (*)    BUN 30 (*)    Creatinine, Ser 2.12 (*)    Calcium 12.2 (*)    Total Protein 8.3 (*)    GFR, Estimated 30 (*)    All other components within normal limits  TROPONIN I (HIGH  SENSITIVITY) - Abnormal; Notable for the following components:   Troponin I (High Sensitivity) 31 (*)    All other components within normal limits  TROPONIN I (HIGH SENSITIVITY) - Abnormal; Notable for the following components:   Troponin I (High Sensitivity) 32 (*)    All other components within normal limits  RESP PANEL BY RT-PCR (RSV, FLU A&B, COVID)  RVPGX2  LIPASE, BLOOD  URINALYSIS, ROUTINE W REFLEX MICROSCOPIC    EKG EKG Interpretation Date/Time:  Monday November 12 2022 16:59:27 EDT Ventricular Rate:  90 PR Interval:  182 QRS Duration:  73 QT Interval:  347 QTC Calculation: 425 R Axis:   70  Text Interpretation: Sinus rhythm Confirmed by Kristine Royal [phone]) on 11/12/2022 5:15:41 PM  Radiology CT Lumbar Spine Wo Contrast  Result Date: 11/12/2022 CLINICAL DATA:  Fall, back pain EXAM: CT LUMBAR SPINE WITHOUT CONTRAST TECHNIQUE: Multidetector CT imaging  of the lumbar spine was performed without intravenous contrast administration. Multiplanar CT image reconstructions were also generated. RADIATION DOSE REDUCTION: This exam was performed according to the departmental dose-optimization program which includes automated exposure control, adjustment of the mA and/or kV according to patient size and/or use of iterative reconstruction technique. COMPARISON:  Plain films today. FINDINGS: Segmentation: 5 lumbar type vertebrae. Alignment: 4 mm anterolisthesis of L4 on L5 related to degenerative facet disease. Vertebrae: Slight compression fracture the superior endplate of T12. This is age indeterminate. No additional fracture. Diffuse osteopenia. Paraspinal and other soft tissues: Paraspinal soft tissues negative. Large posterior bladder wall diverticulum on the right. Aortic atherosclerosis without aneurysm. Bilateral renal cysts. Disc levels: Advanced degenerative facet disease, most pronounced from L3-4 through L5-S1. no disc herniation. IMPRESSION: Slight compression fracture through the anterior superior endplate of T12, age indeterminate. Degenerative changes, most pronounced in the facets from L3-4 through L5-S1. Electronically Signed   By: Charlett Nose M.D.   On: 11/12/2022 19:21   DG Lumbar Spine 2-3 Views  Result Date: 11/12/2022 CLINICAL DATA:  Fall EXAM: LUMBAR SPINE - 2-3 VIEW COMPARISON:  None Available. FINDINGS: There is 4 mm of anterolisthesis at L4-L5 which is favored as degenerative. Alignment is otherwise anatomic. There is no acute fracture. There significant degenerative changes of facet joints throughout the lumbar spine. There are atherosclerotic calcifications of the aorta. IMPRESSION: 1. No acute fracture. 2. Grade 1 anterolisthesis at L4-L5 is favored as degenerative. Electronically Signed   By: Darliss Cheney M.D.   On: 11/12/2022 19:09   DG Pelvis 1-2 Views  Result Date: 11/12/2022 CLINICAL DATA:  Fall EXAM: PELVIS - 1-2 VIEW COMPARISON:   None Available. FINDINGS: Hip joints and SI joints symmetric. No acute bony abnormality. Specifically, no fracture, subluxation, or dislocation. IMPRESSION: No acute bony abnormality. Electronically Signed   By: Charlett Nose M.D.   On: 11/12/2022 19:07   CT Head Wo Contrast  Result Date: 11/12/2022 CLINICAL DATA:  Mental status changes.  Fall. EXAM: CT HEAD WITHOUT CONTRAST TECHNIQUE: Contiguous axial images were obtained from the base of the skull through the vertex without intravenous contrast. RADIATION DOSE REDUCTION: This exam was performed according to the departmental dose-optimization program which includes automated exposure control, adjustment of the mA and/or kV according to patient size and/or use of iterative reconstruction technique. COMPARISON:  None Available. FINDINGS: Brain: There is atrophy and chronic small vessel disease changes. No acute intracranial abnormality. Specifically, no hemorrhage, hydrocephalus, mass lesion, acute infarction, or significant intracranial injury. Vascular: No hyperdense vessel or unexpected calcification. Skull: No acute calvarial abnormality. Sinuses/Orbits: No acute  findings Other: None IMPRESSION: Atrophy, chronic microvascular disease. No acute intracranial abnormality. Electronically Signed   By: Charlett Nose M.D.   On: 11/12/2022 19:03   DG Chest Port 1 View  Result Date: 11/12/2022 CLINICAL DATA:  Fall, weakness.  History of COPD, former smoker. EXAM: PORTABLE CHEST 1 VIEW COMPARISON:  04/20/2020 FINDINGS: The heart size and mediastinal contours are within normal limits. Both lungs are clear. The visualized skeletal structures are unremarkable. IMPRESSION: No active disease. Electronically Signed   By: Charlett Nose M.D.   On: 11/12/2022 19:03    Procedures Procedures  {Document cardiac monitor, telemetry assessment procedure when appropriate:1}  Medications Ordered in ED Medications  sodium chloride 0.9 % bolus 500 mL (0 mLs Intravenous Stopped  11/12/22 1857)    ED Course/ Medical Decision Making/ A&P   {   Click here for ABCD2, HEART and other calculatorsREFRESH Note before signing :1}                              Medical Decision Making Amount and/or Complexity of Data Reviewed Labs: ordered. Radiology: ordered.   ***  {Document critical care time when appropriate:1} {Document review of labs and clinical decision tools ie heart score, Chads2Vasc2 etc:1}  {Document your independent review of radiology images, and any outside records:1} {Document your discussion with family members, caretakers, and with consultants:1} {Document social determinants of health affecting pt's care:1} {Document your decision making why or why not admission, treatments were needed:1} Final Clinical Impression(s) / ED Diagnoses Final diagnoses:  None    Rx / DC Orders ED Discharge Orders     None

## 2022-11-13 DIAGNOSIS — Z79890 Hormone replacement therapy (postmenopausal): Secondary | ICD-10-CM | POA: Diagnosis not present

## 2022-11-13 DIAGNOSIS — N1831 Chronic kidney disease, stage 3a: Secondary | ICD-10-CM | POA: Diagnosis present

## 2022-11-13 DIAGNOSIS — C61 Malignant neoplasm of prostate: Secondary | ICD-10-CM | POA: Diagnosis present

## 2022-11-13 DIAGNOSIS — I129 Hypertensive chronic kidney disease with stage 1 through stage 4 chronic kidney disease, or unspecified chronic kidney disease: Secondary | ICD-10-CM | POA: Diagnosis present

## 2022-11-13 DIAGNOSIS — I2510 Atherosclerotic heart disease of native coronary artery without angina pectoris: Secondary | ICD-10-CM | POA: Diagnosis present

## 2022-11-13 DIAGNOSIS — Z1152 Encounter for screening for COVID-19: Secondary | ICD-10-CM | POA: Diagnosis not present

## 2022-11-13 DIAGNOSIS — Z66 Do not resuscitate: Secondary | ICD-10-CM | POA: Diagnosis present

## 2022-11-13 DIAGNOSIS — M8458XA Pathological fracture in neoplastic disease, other specified site, initial encounter for fracture: Secondary | ICD-10-CM | POA: Diagnosis present

## 2022-11-13 DIAGNOSIS — N179 Acute kidney failure, unspecified: Secondary | ICD-10-CM | POA: Diagnosis present

## 2022-11-13 DIAGNOSIS — R627 Adult failure to thrive: Secondary | ICD-10-CM | POA: Diagnosis present

## 2022-11-13 DIAGNOSIS — E785 Hyperlipidemia, unspecified: Secondary | ICD-10-CM | POA: Diagnosis present

## 2022-11-13 DIAGNOSIS — E039 Hypothyroidism, unspecified: Secondary | ICD-10-CM | POA: Diagnosis present

## 2022-11-13 DIAGNOSIS — E440 Moderate protein-calorie malnutrition: Secondary | ICD-10-CM | POA: Diagnosis present

## 2022-11-13 DIAGNOSIS — Z515 Encounter for palliative care: Secondary | ICD-10-CM | POA: Diagnosis not present

## 2022-11-13 DIAGNOSIS — C154 Malignant neoplasm of middle third of esophagus: Secondary | ICD-10-CM | POA: Diagnosis present

## 2022-11-13 DIAGNOSIS — G9341 Metabolic encephalopathy: Secondary | ICD-10-CM | POA: Diagnosis present

## 2022-11-13 DIAGNOSIS — J449 Chronic obstructive pulmonary disease, unspecified: Secondary | ICD-10-CM | POA: Diagnosis present

## 2022-11-13 DIAGNOSIS — E871 Hypo-osmolality and hyponatremia: Secondary | ICD-10-CM | POA: Diagnosis present

## 2022-11-13 DIAGNOSIS — E860 Dehydration: Secondary | ICD-10-CM | POA: Diagnosis present

## 2022-11-13 DIAGNOSIS — D6959 Other secondary thrombocytopenia: Secondary | ICD-10-CM | POA: Diagnosis present

## 2022-11-13 DIAGNOSIS — C7951 Secondary malignant neoplasm of bone: Secondary | ICD-10-CM | POA: Diagnosis present

## 2022-11-13 DIAGNOSIS — Z7401 Bed confinement status: Secondary | ICD-10-CM | POA: Diagnosis not present

## 2022-11-13 DIAGNOSIS — R059 Cough, unspecified: Secondary | ICD-10-CM | POA: Diagnosis not present

## 2022-11-13 DIAGNOSIS — B370 Candidal stomatitis: Secondary | ICD-10-CM | POA: Diagnosis present

## 2022-11-13 DIAGNOSIS — Z87891 Personal history of nicotine dependence: Secondary | ICD-10-CM | POA: Diagnosis not present

## 2022-11-13 LAB — COMPREHENSIVE METABOLIC PANEL
ALT: 11 U/L (ref 0–44)
AST: 29 U/L (ref 15–41)
Albumin: 3.4 g/dL — ABNORMAL LOW (ref 3.5–5.0)
Alkaline Phosphatase: 99 U/L (ref 38–126)
Anion gap: 11 (ref 5–15)
BUN: 27 mg/dL — ABNORMAL HIGH (ref 8–23)
CO2: 25 mmol/L (ref 22–32)
Calcium: 11.8 mg/dL — ABNORMAL HIGH (ref 8.9–10.3)
Chloride: 93 mmol/L — ABNORMAL LOW (ref 98–111)
Creatinine, Ser: 1.93 mg/dL — ABNORMAL HIGH (ref 0.61–1.24)
GFR, Estimated: 34 mL/min — ABNORMAL LOW (ref >60)
Glucose, Bld: 96 mg/dL (ref 70–99)
Potassium: 3.4 mmol/L — ABNORMAL LOW (ref 3.5–5.1)
Sodium: 129 mmol/L — ABNORMAL LOW (ref 135–145)
Total Bilirubin: 0.8 mg/dL (ref 0.3–1.2)
Total Protein: 7.6 g/dL (ref 6.5–8.1)

## 2022-11-13 LAB — AMMONIA: Ammonia: 20 umol/L (ref 9–35)

## 2022-11-13 LAB — T4, FREE: Free T4: 0.88 ng/dL (ref 0.61–1.12)

## 2022-11-13 LAB — TSH: TSH: 29.833 u[IU]/mL — ABNORMAL HIGH (ref 0.350–4.500)

## 2022-11-13 LAB — VITAMIN B12: Vitamin B-12: 230 pg/mL (ref 180–914)

## 2022-11-13 LAB — VITAMIN D 25 HYDROXY (VIT D DEFICIENCY, FRACTURES): Vit D, 25-Hydroxy: 57.29 ng/mL (ref 30–100)

## 2022-11-13 MED ORDER — SODIUM CHLORIDE 0.9 % IV SOLN: INTRAVENOUS | Status: AC

## 2022-11-13 MED ORDER — SENNOSIDES-DOCUSATE SODIUM 8.6-50 MG PO TABS: 1.0000 | ORAL_TABLET | Freq: Every evening | ORAL | Status: DC | PRN

## 2022-11-13 MED ORDER — METOPROLOL TARTRATE 5 MG/5ML IV SOLN: 5.0000 mg | INTRAVENOUS | Status: DC | PRN

## 2022-11-13 MED ORDER — HYDRALAZINE HCL 20 MG/ML IJ SOLN: 10.0000 mg | INTRAMUSCULAR | Status: DC | PRN

## 2022-11-13 MED ORDER — RELUGOLIX 120 MG PO TABS
240.0000 mg | ORAL_TABLET | Freq: Every day | ORAL | Status: DC
  Administered 2022-11-15 – 2022-11-16 (×2): 240 mg via ORAL

## 2022-11-13 MED ORDER — ACETAMINOPHEN 650 MG RE SUPP: 650.0000 mg | Freq: Four times a day (QID) | RECTAL | Status: DC | PRN

## 2022-11-13 MED ORDER — ACETAMINOPHEN 325 MG PO TABS: 650.0000 mg | ORAL_TABLET | Freq: Four times a day (QID) | ORAL | Status: DC | PRN

## 2022-11-13 MED ORDER — ZOLEDRONIC ACID 4 MG/5ML IV CONC
2.0000 mg | Freq: Once | INTRAVENOUS | Status: AC
  Administered 2022-11-13: 2 mg via INTRAVENOUS
  Filled 2022-11-13 (×2): qty 2.5

## 2022-11-13 MED ORDER — ENOXAPARIN SODIUM 40 MG/0.4ML IJ SOSY
40.0000 mg | PREFILLED_SYRINGE | INTRAMUSCULAR | Status: DC
  Administered 2022-11-13 – 2022-11-16 (×3): 40 mg via SUBCUTANEOUS
  Filled 2022-11-13 (×4): qty 0.4

## 2022-11-13 MED ORDER — TRAZODONE HCL 50 MG PO TABS: 50.0000 mg | ORAL_TABLET | Freq: Every evening | ORAL | Status: DC | PRN

## 2022-11-13 MED ORDER — ENOXAPARIN SODIUM 30 MG/0.3ML IJ SOSY: 30.0000 mg | PREFILLED_SYRINGE | INTRAMUSCULAR | Status: DC

## 2022-11-13 MED ORDER — NYSTATIN 100000 UNIT/ML MT SUSP
5.0000 mL | Freq: Four times a day (QID) | OROMUCOSAL | Status: DC
  Administered 2022-11-13 – 2022-11-16 (×14): 500000 [IU] via ORAL
  Filled 2022-11-13 (×14): qty 5

## 2022-11-13 MED ORDER — ENSURE ENLIVE PO LIQD
237.0000 mL | Freq: Two times a day (BID) | ORAL | Status: DC
  Administered 2022-11-13 – 2022-11-16 (×5): 237 mL via ORAL

## 2022-11-13 MED ORDER — ADULT MULTIVITAMIN W/MINERALS CH
1.0000 | ORAL_TABLET | Freq: Every day | ORAL | Status: DC
  Administered 2022-11-13 – 2022-11-16 (×4): 1 via ORAL
  Filled 2022-11-13 (×4): qty 1

## 2022-11-13 MED ORDER — SODIUM CHLORIDE 0.9 % IV SOLN: INTRAVENOUS | Status: DC

## 2022-11-13 MED ORDER — ONDANSETRON HCL 4 MG/2ML IJ SOLN: 4.0000 mg | Freq: Four times a day (QID) | INTRAMUSCULAR | Status: DC | PRN

## 2022-11-13 MED ORDER — IPRATROPIUM-ALBUTEROL 0.5-2.5 (3) MG/3ML IN SOLN: 3.0000 mL | RESPIRATORY_TRACT | Status: DC | PRN

## 2022-11-13 MED ORDER — ONDANSETRON HCL 4 MG PO TABS: 4.0000 mg | ORAL_TABLET | Freq: Four times a day (QID) | ORAL | Status: DC | PRN

## 2022-11-13 MED ORDER — GUAIFENESIN 100 MG/5ML PO LIQD
5.0000 mL | ORAL | Status: DC | PRN
  Administered 2022-11-16: 5 mL via ORAL
  Filled 2022-11-13: qty 10

## 2022-11-13 NOTE — Plan of Care (Signed)

## 2022-11-13 NOTE — Progress Notes (Addendum)
IP PROGRESS NOTE  Subjective:   Aaron [name] is known to me with remote history of esophagus cancer and current hormone refractory prostate cancer.  He has been referred to consider Pluvicto therapy.  He presented to the emergency room yesterday with generalized weakness and the inability to ambulate.  He was admitted for further evaluation.  The admission chemistry panel was remarkable for a calcium of 12.2, BUN 30, creatinine 2.12, and albumin 3.8.  Aaron [name] denies pain.  He has no specific complaint this morning.  His family is not present.   Objective: Vital signs in last 24 hours: Blood pressure 122/70, pulse (!) 104, temperature 99.6 F (37.6 C), temperature source Oral, resp. rate 19, height 6\' 3"  (1.905 m), weight 176 lb 5.9 oz (80 kg), SpO2 95%.  Intake/Output from previous day: 08/19 0701 - 08/20 0700 In: 793.8 [I.V.:293.8; IV Piggyback:500] Out: -   Physical Exam:  HEENT: Thrush over the palate and tongue Lungs: Distant breath sounds, inspiratory rhonchi at the right lower posterior chest, no respiratory distress Cardiac: Regular rhythm with premature beats Abdomen: Nontender, no hepatosplenomegaly Extremities: No leg edema Neurologic: Oriented to year, not month or place.  Follows commands.  Moves all extremities to command.  The leg strength appears intact bilaterally  Portacath/PICC-without erythema  Lab Results: Recent Labs    11/12/22 1825  WBC 5.1  HGB 10.4*  HCT 31.5*  PLT 120*    BMET Recent Labs    11/12/22 1825 11/13/22 0217  NA 128* 129*  K 3.9 3.4*  CL 92* 93*  CO2 24 25  GLUCOSE 107* 96  BUN 30* 27*  CREATININE 2.12* 1.93*  CALCIUM 12.2* 11.8*    No results found for: "CEA1", "CEA", "ZOX096", "CA125"  Studies/Results: MR TOTAL SPINE METS SCREENING  Result Date: 11/13/2022 CLINICAL DATA:  Prostate cancer, metastatic to bone EXAM: MRI TOTAL SPINE WITHOUT AND WITH CONTRAST TECHNIQUE: Multisequence MR imaging of the spine from the  cervical spine to the sacrum was performed prior to and following IV contrast administration for evaluation of spinal metastatic disease. CONTRAST:  8mL GADAVIST GADOBUTROL 1 MMOL/ML IV SOLN COMPARISON:  No prior MRI of the spine available, correlation is made with 11/12/2022 CT lumbar spine and 10/18/2022 PET-CT FINDINGS: MRI CERVICAL SPINE FINDINGS Alignment: No significant listhesis. Preservation of the normal cervical lordosis. Vertebrae: Diffusely abnormal marrow signal throughout the cervical spine, which correlates with the diffuse metabolically active lesions seen on the recent PET-CT. Vertebral body heights appear preserved. The axial sequences are somewhat motion limited, however there does not appear to be cord compression secondary to epidural extension of tumor. Cord: Normal signal and morphology.  No abnormal enhancement. Posterior Fossa, vertebral arteries, paraspinal tissues: Grossly normal. Disc levels: C3-C4: Moderate disc bulge. Facet and uncovertebral hypertrophy. Ligamentum flavum hypertrophy. Moderate to severe spinal canal stenosis. Severe right and moderate to severe left neural foraminal narrowing. C4-C5: Moderate disc bulge. Facet and uncovertebral hypertrophy. Mild-to-moderate spinal canal stenosis. Severe right and moderate to severe left neural foraminal narrowing. C5-C6: Moderate disc bulge. Facet and uncovertebral hypertrophy. Ligamentum flavum hypertrophy. Moderate to severe spinal canal stenosis. Moderate right and mild left neural foraminal narrowing. Milder degenerative changes at other cervical levels. MRI THORACIC SPINE FINDINGS Alignment: No listhesis. Mild S-shaped curvature of the thoracolumbar spine. Vertebrae: Diffusely abnormal marrow signal throughout the thoracic spine. No definite acute fracture. No epidural extension of tumor. Cord:  Normal spinal cord signal.  No abnormal enhancement. Paraspinal and other soft tissues: No acute finding. Disc levels:  No significant  spinal canal stenosis. MRI LUMBAR SPINE FINDINGS Segmentation: 5 lumbar type vertebral bodies. Lumbarization of S1. The last fully formed disc space is labeled S1-S2. Alignment: Trace anterolisthesis of L5 on S1. S-shaped curvature of the thoracolumbar spine. Vertebrae: Diffusely abnormal marrow signal. No evidence of acute fracture or epidural extension of tumor. Conus medullaris: Extends to the L1 level and appears normal. No abnormal enhancement. Paraspinal and other soft tissues: No definite acute finding given only sagittal images. Redemonstrated bladder diverticulum. Disc levels: No high-grade spinal canal stenosis. IMPRESSION: 1. Diffusely abnormal marrow signal throughout the spine, which correlates with the diffuse metabolically active lesions seen on the 10/18/2022 PET-CT. 2. No evidence of epidural extension of tumor or pathologic cord compression. 3. Degenerative changes result in moderate to severe spinal canal stenosis at C3-C4 and C5-C6. 4. Multilevel neural foraminal narrowing, which is severe on the right at C3-C4 and C4-C5 and moderate to severe on the left at C3-C4 and C4-C5. 5. No high-grade spinal canal stenosis in the thoracic or lumbar spine. Electronically Signed   By: Wiliam Ke M.D.   On: 11/13/2022 00:36   CT Lumbar Spine Wo Contrast  Result Date: 11/12/2022 CLINICAL DATA:  Fall, back pain EXAM: CT LUMBAR SPINE WITHOUT CONTRAST TECHNIQUE: Multidetector CT imaging of the lumbar spine was performed without intravenous contrast administration. Multiplanar CT image reconstructions were also generated. RADIATION DOSE REDUCTION: This exam was performed according to the departmental dose-optimization program which includes automated exposure control, adjustment of the mA and/or kV according to patient size and/or use of iterative reconstruction technique. COMPARISON:  Plain films today. FINDINGS: Segmentation: 5 lumbar type vertebrae. Alignment: 4 mm anterolisthesis of L4 on L5 related to  degenerative facet disease. Vertebrae: Slight compression fracture the superior endplate of T12. This is age indeterminate. No additional fracture. Diffuse osteopenia. Paraspinal and other soft tissues: Paraspinal soft tissues negative. Large posterior bladder wall diverticulum on the right. Aortic atherosclerosis without aneurysm. Bilateral renal cysts. Disc levels: Advanced degenerative facet disease, most pronounced from L3-4 through L5-S1. no disc herniation. IMPRESSION: Slight compression fracture through the anterior superior endplate of T12, age indeterminate. Degenerative changes, most pronounced in the facets from L3-4 through L5-S1. Electronically Signed   By: Charlett Nose M.D.   On: 11/12/2022 19:21   DG Lumbar Spine 2-3 Views  Result Date: 11/12/2022 CLINICAL DATA:  Fall EXAM: LUMBAR SPINE - 2-3 VIEW COMPARISON:  None Available. FINDINGS: There is 4 mm of anterolisthesis at L4-L5 which is favored as degenerative. Alignment is otherwise anatomic. There is no acute fracture. There significant degenerative changes of facet joints throughout the lumbar spine. There are atherosclerotic calcifications of the aorta. IMPRESSION: 1. No acute fracture. 2. Grade 1 anterolisthesis at L4-L5 is favored as degenerative. Electronically Signed   By: Darliss Cheney M.D.   On: 11/12/2022 19:09   DG Pelvis 1-2 Views  Result Date: 11/12/2022 CLINICAL DATA:  Fall EXAM: PELVIS - 1-2 VIEW COMPARISON:  None Available. FINDINGS: Hip joints and SI joints symmetric. No acute bony abnormality. Specifically, no fracture, subluxation, or dislocation. IMPRESSION: No acute bony abnormality. Electronically Signed   By: Charlett Nose M.D.   On: 11/12/2022 19:07   CT Head Wo Contrast  Result Date: 11/12/2022 CLINICAL DATA:  Mental status changes.  Fall. EXAM: CT HEAD WITHOUT CONTRAST TECHNIQUE: Contiguous axial images were obtained from the base of the skull through the vertex without intravenous contrast. RADIATION DOSE  REDUCTION: This exam was performed according to the departmental dose-optimization program  which includes automated exposure control, adjustment of the mA and/or kV according to patient size and/or use of iterative reconstruction technique. COMPARISON:  None Available. FINDINGS: Brain: There is atrophy and chronic small vessel disease changes. No acute intracranial abnormality. Specifically, no hemorrhage, hydrocephalus, mass lesion, acute infarction, or significant intracranial injury. Vascular: No hyperdense vessel or unexpected calcification. Skull: No acute calvarial abnormality. Sinuses/Orbits: No acute findings Other: None IMPRESSION: Atrophy, chronic microvascular disease. No acute intracranial abnormality. Electronically Signed   By: Charlett Nose M.D.   On: 11/12/2022 19:03   DG Chest Port 1 View  Result Date: 11/12/2022 CLINICAL DATA:  Fall, weakness.  History of COPD, former smoker. EXAM: PORTABLE CHEST 1 VIEW COMPARISON:  04/20/2020 FINDINGS: The heart size and mediastinal contours are within normal limits. Both lungs are clear. The visualized skeletal structures are unremarkable. IMPRESSION: No active disease. Electronically Signed   By: Charlett Nose M.D.   On: 11/12/2022 19:03    Medications: I have reviewed the patient's current medications.  Assessment/Plan: Squamous cell carcinoma of the esophagus, status post an endoscopic biopsy, 04/07/2009, with the pathology confirming at least superficial invasion. PET scan, 04/20/2009, confirmed hypermetabolic activity associated with the upper and thoracic esophagus mass with a single hypermetabolic right paratracheal lymph node. He began concurrent radiation and weekly Taxol/carboplatin chemotherapy, 05/03/2009. The last treatment with Taxol/carboplatin was given 06/14/2009. He completed radiation on 06/16/2009.   Solid/liquid dysphagia secondary to the proximal esophageal mass, resolved. He is status post an esophageal dilatation for management of  a stricture, 08/24/2009, repeat esophageal dilatation 10/03/2011. Borderline enlarged mediastinal and gastrohepatic lymph nodes on staging CT scans. PET scan 04/20/2009 confirmed hypermetabolic activity associated with the right paratracheal lymph node. Hypertension. Status post placement of a feeding jejunostomy tube by Dr. Michaell Cowing. The jejunostomy tube has been removed. Admission with neutropenia and anemia, March 2011.   Fever of unknown origin, April 2011, resolved. He was treated with prednisone beginning on 07/13/2009 for presumed radiation pneumonitis. He developed episodes of rigors on prednisone, and the prednisone was discontinued, 07/15/2009. A CT scan did not reveal evidence for an infectious process. Zoster rash at the right back and anterior chest July 2013. Questionable leukoplakia on exam 04/09/2013-he was evaluated by dental medicine Prostate cancer-Gleason 9 prostate cancer diagnosed in June 2022 Orgovyx August 2022, and apalutamide, September 2022 on the HERO trial 01/12/2021 - 03/14/2021 external beam radiation, 45 Gy in 25 fractions to prostate, seminal vesicles, and pelvic lymph nodes, prostate only boosted to 75 Gy with 15 additional fractions of 2 Gy nodes Rising PSA May 2024 09/11/2022 PSA 12.7, testosterone 16.9 PSMA PET 10/18/2022-significant decrease in hypermetabolism associated with the prostate, abdominopelvic lymph nodes, and seminal vesicles, development of diffuse osseous and probable thoracic lymph node metastases MRI total spine 11/13/2022-diffuse metastatic prostate cancer involving the spine, no evidence of epidural tumor extension or cord compression, spinal stenosis at C3-C4 and C5-C6 secondary to degenerative changes 12.  Admission 11/12/2022 with hypercalcemia   Aaron [name] has metastatic hormone refractory prostate cancer.  He presented yesterday with generalized weakness and hypercalcemia.  He has altered mental status.  I suspect the current clinical  presentation is related to symptoms from hypercalcemia of malignancy in addition to failure to thrive from advanced metastatic prostate cancer.  He does not appear to have a cord compression syndrome.  Recommendations: Intravenous hydration Biphosphonate therapy for hypercalcemia Physical therapy evaluation Proceed with nuclear medicine neurology evaluation to consider Pluvicto therapy for the refractory prostate cancer Continue Relugolix Nystatin for oral  candidiasis          LOS: 0 days   Thornton Papas, MD   11/13/2022, 6:58 AM

## 2022-11-13 NOTE — TOC Initial Note (Addendum)
Transition of Care [facility]) - Initial/Assessment Note    Patient Details  Name: Aaron [name] MRN: [id number] Date of Birth: Feb 18, 1936  Transition of Care [facility]) CM/SW Contact:    Howell Rucks, RN Phone Number: 11/13/2022, 10:55 AM  Clinical Narrative: Met with pt at bedside, pt admitted with AMS. NCM called to pt's spouse Steward Drone) to introduce role of TOC/NCM and review for dc planning,  confirmed pt has PCP, Oncologist, pharmacy in place, no current home care  services, reports pt uses a family member's walker, wife reports pt needs a walker, wheelchair and hospital bed. TOC consult for HH/DME needs, request sent to attending to enter PT eval for DME recommendations. Wife reports she and her son will be up to hospital later today, NCM will meet with family at  bedside. TOC will continue to follow.     -1:23pm  Met with pt's spouse Steward Drone) at bedside, provided list of personal care services agencies. Steward Drone requesting additional DME: transfer  board, BSC, shower chair, also requesting Palliative Medicine consult, attending notified of all requests. TOC will continue to follow.                 Barriers to Discharge: Continued Medical Work up   Patient Goals and CMS Choice Patient states their goals for this hospitalization and ongoing recovery are:: pt admitted with AMS, dc plan TBD          Expected Discharge Plan and Services   Discharge Planning Services: CM Consult   Living arrangements for the past 2 months: Single Family Home                                      Prior Living Arrangements/Services Living arrangements for the past 2 months: Single Family Home Lives with:: Spouse Patient language and need for interpreter reviewed:: Yes        Need for Family Participation in Patient Care: Yes (Comment) Care giver support system in place?: Yes (comment) Current home services: DME (walker) Criminal Activity/Legal Involvement Pertinent to Current  Situation/Hospitalization: No - Comment as needed  Activities of Daily Living Home Assistive Devices/Equipment: Eyeglasses, Walker (specify type), Dentures (specify type) ADL Screening (condition at time of admission) Patient's cognitive ability adequate to safely complete daily activities?: No Is the patient deaf or have difficulty hearing?: Yes Does the patient have difficulty seeing, even when wearing glasses/contacts?: No Does the patient have difficulty concentrating, remembering, or making decisions?: Yes Patient able to express need for assistance with ADLs?: Yes Does the patient have difficulty dressing or bathing?: Yes Independently performs ADLs?: No Communication: Needs assistance Is this a change from baseline?: Pre-admission baseline Dressing (OT): Needs assistance Is this a change from baseline?: Pre-admission baseline Grooming: Needs assistance Is this a change from baseline?: Pre-admission baseline Feeding: Needs assistance Is this a change from baseline?: Pre-admission baseline (set up) Bathing: Needs assistance Is this a change from baseline?: Pre-admission baseline Toileting: Dependent Is this a change from baseline?: Pre-admission baseline In/Out Bed: Dependent Is this a change from baseline?: Pre-admission baseline Walks in Home: Needs assistance Is this a change from baseline?: Pre-admission baseline Does the patient have difficulty walking or climbing stairs?: Yes Weakness of Legs: Both Weakness of Arms/Hands: None  Permission Sought/Granted Permission sought to share information with : Case Manager Permission granted to share information with : Yes, Verbal Permission Granted  Share Information with NAME: Fannie Knee, RN  Emotional Assessment Appearance:: Appears stated age Attitude/Demeanor/Rapport: Other (comment) (AMS) Affect (typically observed): Other (comment) (AMS)   Alcohol / Substance Use: Not Applicable Psych Involvement: No  (comment)  Admission diagnosis:  Hypercalcemia [E83.52] Weakness [R53.1] AKI (acute kidney injury) (HCC) [N17.9] Patient Active Problem List   Diagnosis Date Noted   Hypercalcemia 11/12/2022   AKI (acute kidney injury) (HCC) 11/12/2022   Fatigue 10/22/2022   Hearing loss 08/09/2022   Leg weakness, bilateral 05/21/2022   Gait disturbance 12/14/2020   Malignant neoplasm of prostate (HCC) 11/15/2020   Memory problem 04/20/2020   Constipation 04/20/2020   CRI (chronic renal insufficiency), stage 3 (moderate) (HCC) 10/10/2018   Anxiety 10/24/2017   Acute bronchitis 07/04/2017   Prostate cancer (HCC) 08/30/2016   Wart of scalp 01/31/2015   Cardiac murmur 12/20/2014   Well adult exam 01/22/2013   Neoplasm of uncertain behavior of skin 01/22/2013   CAD (coronary artery disease) 11/27/2012   Cerumen impaction 10/16/2012   Angular stomatitis 03/13/2012   Esophageal cancer (HCC)    Arrhythmia    Knee pain, left 12/27/2010   Hypothyroidism 06/02/2010   Keloid scar 06/02/2010   COPD exacerbation (HCC) 02/24/2010   Cough 02/24/2010   SEBACEOUS CYST, INFECTED 12/20/2009   INVOLUNTARY MOVEMENTS, ABNORMAL 08/17/2009   Malignant neoplasm of thoracic esophagus (HCC) 06/13/2009   Iron deficiency anemia 06/13/2009   FEVER UNSPECIFIED 06/13/2009   Stricture and stenosis of esophagus 04/06/2009   Weight loss 04/06/2009   DYSPHAGIA 04/06/2009   TOBACCO USE, QUIT 03/01/2009   RASH-NONVESICULAR 03/23/2008   ALLERGIC RHINITIS 03/04/2008   CRAMPS,LEG 09/04/2007   Hyperglycemia 09/04/2007   SINUSITIS, ACUTE 02/28/2007   GERD 02/28/2007   Dyslipidemia 01/27/2007   Essential hypertension 01/27/2007   DIVERTICULOSIS, COLON 01/27/2007   PCP:  Tresa Garter, MD Pharmacy:   [facility]) - [place], [place] - [address] [facility] [address] [place] [address] [place] [address] Phone: [phone] Fax: [phone]  [id number] [place],  [place] - [address] AT [facility] & [address] [place] [address] Phone: [phone] Fax: [phone]  [facility] - [place], [place] - [address] [place]  [address] Phone: [phone] Fax: [phone]     Social Determinants of Health (SDOH) Social History: SDOH Screenings   Food Insecurity: No Food Insecurity (11/12/2022)  Housing: Low Risk  (11/12/2022)  Transportation Needs: No Transportation Needs (11/12/2022)  Utilities: Not At Risk (11/12/2022)  Alcohol Screen: Low Risk  (09/10/2022)  Depression (PHQ2-9): High Risk (11/09/2022)  Financial Resource Strain: Low Risk  (09/10/2022)  Physical Activity: Inactive (09/10/2022)  Social Connections: Socially Integrated (09/10/2022)  Stress: No Stress Concern Present (09/10/2022)  Tobacco Use: Medium Risk (11/12/2022)   SDOH Interventions:     Readmission Risk Interventions     No data to display

## 2022-11-13 NOTE — Progress Notes (Addendum)
PROGRESS NOTE    Aaron [name]  XBJ:[id number] DOB: 1935/08/22 DOA: 11/12/2022 PCP: Tresa Garter, MD   Brief Narrative:  87 year old with history of esophageal cancer treated in 2011 with chemoradiation, inoperable but apparently long-term remission, prostate cancer June 2022 on radiation/hormonal therapy which has recently progressed and became widely metastatic as of October 18, 2022.  Seen by oncology on the day of admission patient has become significantly weaker therefore comes to the hospital.   Assessment & Plan:  Principal Problem:   AKI (acute kidney injury) (HCC) Active Problems:   Malignant neoplasm of prostate (HCC)   Hypercalcemia   CRI (chronic renal insufficiency), stage 3 (moderate) (HCC)   Malignant neoplasm of thoracic esophagus (HCC)   Hypothyroidism   AKI (acute kidney injury) (HCC) on CKD stage III AAA Baseline creatinine 1.4, admission creatinine 2.12.  Slowly improving with IV fluids.   Hypercalcemia Admission calcium 12.2, slowly improving. In the setting of widespread metastatic cancer.  IV fluids and bisphosphonate therapy.  Check vitamin D and PTH  Generalized weakness - CT of the head negative.  Elevated TSH, normal free T4.  Check cortisol levels.   Malignant neoplasm of prostate [facility]) Now with metastatic disease.  Previously patient has had known squamous cell carcinoma of esophagus in 2011 and thereafter also prostate cancer in June 2022. Will defer cancer treatment to oncology team, Dr. Truett Perna is following this patient. MRI of spine shows multilevel degenerative disease and also abnormal signaling consistent with likely metastatic disease.   Hypothyroidism Continue Synthroid   Malignant neoplasm of thoracic esophagus (HCC) "Remote" history of.  Despite Stage 4 and inoperable, apparently was successfully treated back in 2011 into long term remission with chemo + radiation from what I can tell?  PT/OT Nutrition consult   DVT  prophylaxis: enoxaparin (LOVENOX) injection 30 mg Start: 11/13/22 1000 Code Status: Full Family Communication:  Spouse Updated Cont hosp stay for multiple issues       Diet Orders (From admission, onward)     Start     Ordered   11/12/22 2349  Diet Heart Fluid consistency: Thin  Diet effective now       Question:  Fluid consistency:  Answer:  Thin   11/12/22 2348            Subjective: Doing ok no complaints.  Feels slightly better.    Examination:  General exam: Appears calm and comfortable ; generally weak.  Respiratory system: Clear to auscultation. Respiratory effort normal. Cardiovascular system: S1 & S2 heard, RRR. No JVD, murmurs, rubs, gallops or clicks. No pedal edema. Gastrointestinal system: Abdomen is nondistended, soft and nontender. No organomegaly or masses felt. Normal bowel sounds heard. Central nervous system: Alert and oriented. No focal neurological deficits. Extremities: Symmetric 4 x 5 power. Skin: No rashes, lesions or ulcers Psychiatry: Judgement and insight appear normal. Mood & affect appropriate.  Objective: Vitals:   11/12/22 2000 11/12/22 2345 11/12/22 2346 11/13/22 0457  BP: 111/65 (!) 109/57  122/70  Pulse: 78 95  (!) 104  Resp: 19 20  19   Temp: 97.6 F (36.4 C) 97.7 F (36.5 C)  99.6 F (37.6 C)  TempSrc:  Oral  Oral  SpO2: 95% 97%  95%  Weight:   80 kg   Height:   6\' 3"  (1.905 m)     Intake/Output Summary (Last 24 hours) at 11/13/2022 0916 Last data filed at 11/13/2022 0230 Gross per 24 hour  Intake 793.75 ml  Output --  Net 793.75 ml  Filed Weights   11/12/22 1855 11/12/22 2346  Weight: 81 kg 80 kg    Scheduled Meds:  aspirin EC  81 mg Oral Daily   enoxaparin (LOVENOX) injection  30 mg Subcutaneous Q24H   latanoprost  1 drop Left Eye QHS   levothyroxine  150 mcg Oral Q0600   nystatin  5 mL Oral QID   simvastatin  10 mg Oral Daily   Continuous Infusions:  sodium chloride 125 mL/hr at 11/13/22 0708     Nutritional status     Body mass index is 22.04 kg/m.  Data Reviewed:   CBC: Recent Labs  Lab 11/12/22 1825  WBC 5.1  NEUTROABS 3.8  HGB 10.4*  HCT 31.5*  MCV 93.8  PLT 120*   Basic Metabolic Panel: Recent Labs  Lab 11/12/22 1825 11/13/22 0217  NA 128* 129*  K 3.9 3.4*  CL 92* 93*  CO2 24 25  GLUCOSE 107* 96  BUN 30* 27*  CREATININE 2.12* 1.93*  CALCIUM 12.2* 11.8*   GFR: Estimated Creatinine Clearance: 32.2 mL/min (A) (by C-G formula based on SCr of 1.93 mg/dL (H)). Liver Function Tests: Recent Labs  Lab 11/12/22 1825 11/13/22 0217  AST 39 29  ALT 12 11  ALKPHOS 114 99  BILITOT 0.5 0.8  PROT 8.3* 7.6  ALBUMIN 3.8 3.4*   Recent Labs  Lab 11/12/22 1825  LIPASE 26   No results for input(s): "AMMONIA" in the last 168 hours. Coagulation Profile: No results for input(s): "INR", "PROTIME" in the last 168 hours. Cardiac Enzymes: No results for input(s): "CKTOTAL", "CKMB", "CKMBINDEX", "TROPONINI" in the last 168 hours. BNP (last 3 results) No results for input(s): "PROBNP" in the last 8760 hours. HbA1C: No results for input(s): "HGBA1C" in the last 72 hours. CBG: No results for input(s): "GLUCAP" in the last 168 hours. Lipid Profile: No results for input(s): "CHOL", "HDL", "LDLCALC", "TRIG", "CHOLHDL", "LDLDIRECT" in the last 72 hours. Thyroid Function Tests: Recent Labs    11/13/22 0217  TSH 29.833*  FREET4 0.88   Anemia Panel: No results for input(s): "VITAMINB12", "FOLATE", "FERRITIN", "TIBC", "IRON", "RETICCTPCT" in the last 72 hours. Sepsis Labs: No results for input(s): "PROCALCITON", "LATICACIDVEN" in the last 168 hours.  Recent Results (from the past 240 hour(s))  Urine Culture     Status: None   Collection Time: 11/09/22  3:24 PM   Specimen: Urine, Clean Catch  Result Value Ref Range Status   Specimen Description   Final    URINE, CLEAN CATCH Performed at [facility], [address]  [place], [place] [address]    Special Requests   Final    NONE Performed at [facility], [address], [place], [place] [address]    Culture   Final    NO GROWTH Performed at [facility], [address] [place], [place] [address]    Report Status 11/10/2022 FINAL  Final  Resp panel by RT-PCR (RSV, Flu A&B, Covid) Anterior Nasal Swab     Status: None   Collection Time: 11/12/22  6:25 PM   Specimen: Anterior Nasal Swab  Result Value Ref Range Status   SARS Coronavirus 2 by RT PCR NEGATIVE NEGATIVE Final    Comment: (NOTE) SARS-CoV-2 target nucleic acids are NOT DETECTED.  The SARS-CoV-2 RNA is generally detectable in upper respiratory specimens during the acute phase of infection. The lowest concentration of SARS-CoV-2 viral copies this assay can detect is 138 copies/mL. A negative result does not preclude  SARS-Cov-2 infection and should not be used as the sole basis for treatment or other patient management decisions. A negative result may occur with  improper specimen collection/handling, submission of specimen other than nasopharyngeal swab, presence of viral mutation(s) within the areas targeted by this assay, and inadequate number of viral copies(<138 copies/mL). A negative result must be combined with clinical observations, patient history, and epidemiological information. The expected result is Negative.  Fact Sheet for Patients:  BloggerCourse.com  Fact Sheet for Healthcare Providers:  SeriousBroker.it  This test is no t yet approved or cleared by the [place] FDA and  has been authorized for detection and/or diagnosis of SARS-CoV-2 by FDA under an Emergency Use Authorization (EUA). This EUA will remain  in effect (meaning this test can be used) for the duration of the COVID-19 declaration under Section 564(b)(1) of the Act, 21 U.S.C.section 360bbb-3(b)(1), unless the authorization is  terminated  or revoked sooner.       Influenza A by PCR NEGATIVE NEGATIVE Final   Influenza B by PCR NEGATIVE NEGATIVE Final    Comment: (NOTE) The Xpert Xpress SARS-CoV-2/FLU/RSV plus assay is intended as an aid in the diagnosis of influenza from Nasopharyngeal swab specimens and should not be used as a sole basis for treatment. Nasal washings and aspirates are unacceptable for Xpert Xpress SARS-CoV-2/FLU/RSV testing.  Fact Sheet for Patients: BloggerCourse.com  Fact Sheet for Healthcare Providers: SeriousBroker.it  This test is not yet approved or cleared by the [place] FDA and has been authorized for detection and/or diagnosis of SARS-CoV-2 by FDA under an Emergency Use Authorization (EUA). This EUA will remain in effect (meaning this test can be used) for the duration of the COVID-19 declaration under Section 564(b)(1) of the Act, 21 U.S.C. section 360bbb-3(b)(1), unless the authorization is terminated or revoked.     Resp Syncytial Virus by PCR NEGATIVE NEGATIVE Final    Comment: (NOTE) Fact Sheet for Patients: BloggerCourse.com  Fact Sheet for Healthcare Providers: SeriousBroker.it  This test is not yet approved or cleared by the [place] FDA and has been authorized for detection and/or diagnosis of SARS-CoV-2 by FDA under an Emergency Use Authorization (EUA). This EUA will remain in effect (meaning this test can be used) for the duration of the COVID-19 declaration under Section 564(b)(1) of the Act, 21 U.S.C. section 360bbb-3(b)(1), unless the authorization is terminated or revoked.  Performed at [facility], [address], [place], [place] [address]          Radiology Studies: MR TOTAL SPINE METS SCREENING  Result Date: 11/13/2022 CLINICAL DATA:  Prostate cancer, metastatic to bone EXAM: MRI TOTAL SPINE WITHOUT AND WITH  CONTRAST TECHNIQUE: Multisequence MR imaging of the spine from the cervical spine to the sacrum was performed prior to and following IV contrast administration for evaluation of spinal metastatic disease. CONTRAST:  8mL GADAVIST GADOBUTROL 1 MMOL/ML IV SOLN COMPARISON:  No prior MRI of the spine available, correlation is made with 11/12/2022 CT lumbar spine and 10/18/2022 PET-CT FINDINGS: MRI CERVICAL SPINE FINDINGS Alignment: No significant listhesis. Preservation of the normal cervical lordosis. Vertebrae: Diffusely abnormal marrow signal throughout the cervical spine, which correlates with the diffuse metabolically active lesions seen on the recent PET-CT. Vertebral body heights appear preserved. The axial sequences are somewhat motion limited, however there does not appear to be cord compression secondary to epidural extension of tumor. Cord: Normal signal and morphology.  No abnormal enhancement. Posterior Fossa, vertebral arteries, paraspinal tissues: Grossly normal. Disc levels: C3-C4:  Moderate disc bulge. Facet and uncovertebral hypertrophy. Ligamentum flavum hypertrophy. Moderate to severe spinal canal stenosis. Severe right and moderate to severe left neural foraminal narrowing. C4-C5: Moderate disc bulge. Facet and uncovertebral hypertrophy. Mild-to-moderate spinal canal stenosis. Severe right and moderate to severe left neural foraminal narrowing. C5-C6: Moderate disc bulge. Facet and uncovertebral hypertrophy. Ligamentum flavum hypertrophy. Moderate to severe spinal canal stenosis. Moderate right and mild left neural foraminal narrowing. Milder degenerative changes at other cervical levels. MRI THORACIC SPINE FINDINGS Alignment: No listhesis. Mild S-shaped curvature of the thoracolumbar spine. Vertebrae: Diffusely abnormal marrow signal throughout the thoracic spine. No definite acute fracture. No epidural extension of tumor. Cord:  Normal spinal cord signal.  No abnormal enhancement. Paraspinal and  other soft tissues: No acute finding. Disc levels: No significant spinal canal stenosis. MRI LUMBAR SPINE FINDINGS Segmentation: 5 lumbar type vertebral bodies. Lumbarization of S1. The last fully formed disc space is labeled S1-S2. Alignment: Trace anterolisthesis of L5 on S1. S-shaped curvature of the thoracolumbar spine. Vertebrae: Diffusely abnormal marrow signal. No evidence of acute fracture or epidural extension of tumor. Conus medullaris: Extends to the L1 level and appears normal. No abnormal enhancement. Paraspinal and other soft tissues: No definite acute finding given only sagittal images. Redemonstrated bladder diverticulum. Disc levels: No high-grade spinal canal stenosis. IMPRESSION: 1. Diffusely abnormal marrow signal throughout the spine, which correlates with the diffuse metabolically active lesions seen on the 10/18/2022 PET-CT. 2. No evidence of epidural extension of tumor or pathologic cord compression. 3. Degenerative changes result in moderate to severe spinal canal stenosis at C3-C4 and C5-C6. 4. Multilevel neural foraminal narrowing, which is severe on the right at C3-C4 and C4-C5 and moderate to severe on the left at C3-C4 and C4-C5. 5. No high-grade spinal canal stenosis in the thoracic or lumbar spine. Electronically Signed   By: Wiliam Ke M.D.   On: 11/13/2022 00:36   CT Lumbar Spine Wo Contrast  Result Date: 11/12/2022 CLINICAL DATA:  Fall, back pain EXAM: CT LUMBAR SPINE WITHOUT CONTRAST TECHNIQUE: Multidetector CT imaging of the lumbar spine was performed without intravenous contrast administration. Multiplanar CT image reconstructions were also generated. RADIATION DOSE REDUCTION: This exam was performed according to the departmental dose-optimization program which includes automated exposure control, adjustment of the mA and/or kV according to patient size and/or use of iterative reconstruction technique. COMPARISON:  Plain films today. FINDINGS: Segmentation: 5 lumbar type  vertebrae. Alignment: 4 mm anterolisthesis of L4 on L5 related to degenerative facet disease. Vertebrae: Slight compression fracture the superior endplate of T12. This is age indeterminate. No additional fracture. Diffuse osteopenia. Paraspinal and other soft tissues: Paraspinal soft tissues negative. Large posterior bladder wall diverticulum on the right. Aortic atherosclerosis without aneurysm. Bilateral renal cysts. Disc levels: Advanced degenerative facet disease, most pronounced from L3-4 through L5-S1. no disc herniation. IMPRESSION: Slight compression fracture through the anterior superior endplate of T12, age indeterminate. Degenerative changes, most pronounced in the facets from L3-4 through L5-S1. Electronically Signed   By: Charlett Nose M.D.   On: 11/12/2022 19:21   DG Lumbar Spine 2-3 Views  Result Date: 11/12/2022 CLINICAL DATA:  Fall EXAM: LUMBAR SPINE - 2-3 VIEW COMPARISON:  None Available. FINDINGS: There is 4 mm of anterolisthesis at L4-L5 which is favored as degenerative. Alignment is otherwise anatomic. There is no acute fracture. There significant degenerative changes of facet joints throughout the lumbar spine. There are atherosclerotic calcifications of the aorta. IMPRESSION: 1. No acute fracture. 2. Grade 1 anterolisthesis at L4-L5  is favored as degenerative. Electronically Signed   By: Darliss Cheney M.D.   On: 11/12/2022 19:09   DG Pelvis 1-2 Views  Result Date: 11/12/2022 CLINICAL DATA:  Fall EXAM: PELVIS - 1-2 VIEW COMPARISON:  None Available. FINDINGS: Hip joints and SI joints symmetric. No acute bony abnormality. Specifically, no fracture, subluxation, or dislocation. IMPRESSION: No acute bony abnormality. Electronically Signed   By: Charlett Nose M.D.   On: 11/12/2022 19:07   CT Head Wo Contrast  Result Date: 11/12/2022 CLINICAL DATA:  Mental status changes.  Fall. EXAM: CT HEAD WITHOUT CONTRAST TECHNIQUE: Contiguous axial images were obtained from the base of the skull  through the vertex without intravenous contrast. RADIATION DOSE REDUCTION: This exam was performed according to the departmental dose-optimization program which includes automated exposure control, adjustment of the mA and/or kV according to patient size and/or use of iterative reconstruction technique. COMPARISON:  None Available. FINDINGS: Brain: There is atrophy and chronic small vessel disease changes. No acute intracranial abnormality. Specifically, no hemorrhage, hydrocephalus, mass lesion, acute infarction, or significant intracranial injury. Vascular: No hyperdense vessel or unexpected calcification. Skull: No acute calvarial abnormality. Sinuses/Orbits: No acute findings Other: None IMPRESSION: Atrophy, chronic microvascular disease. No acute intracranial abnormality. Electronically Signed   By: Charlett Nose M.D.   On: 11/12/2022 19:03   DG Chest Port 1 View  Result Date: 11/12/2022 CLINICAL DATA:  Fall, weakness.  History of COPD, former smoker. EXAM: PORTABLE CHEST 1 VIEW COMPARISON:  04/20/2020 FINDINGS: The heart size and mediastinal contours are within normal limits. Both lungs are clear. The visualized skeletal structures are unremarkable. IMPRESSION: No active disease. Electronically Signed   By: Charlett Nose M.D.   On: 11/12/2022 19:03           LOS: 0 days   Time spent= 35 mins    Miguel Rota, MD [facility]  If 7PM-7AM, please contact night-coverage  11/13/2022, 9:16 AM

## 2022-11-13 NOTE — Progress Notes (Signed)
Initial Nutrition Assessment  DOCUMENTATION CODES:   Non-severe (moderate) malnutrition in context of chronic illness  INTERVENTION:   -Ensure Plus High Protein po BID, each supplement provides 350 kcal and 20 grams of protein.   -Multivitamin with minerals daily  -Liberalize diet to help promote PO intakes  NUTRITION DIAGNOSIS:   Moderate Malnutrition related to chronic illness, cancer and cancer related treatments as evidenced by mild fat depletion, mild muscle depletion, percent weight loss.  GOAL:   Patient will meet greater than or equal to 90% of their needs  MONITOR:   PO intake, Supplement acceptance, Labs, Weight trends, I & O's  REASON FOR ASSESSMENT:   Consult Assessment of nutrition requirement/status  ASSESSMENT:   87 y.o. male with medical history significant of Esophageal CA treated in 2011 with chemo/radiation, inoperable but apparently long term remission? Prostate CA June 2022.  Radiation therapy and hormone therapy.  Patient in room, alert/oriented x 1. Not able to  provide much history. Sitting up in bed, ate a few bites of lunch. Mainly some corn and mashed potatoes. Denies any issues with swallowing or chewing.  Pt open to drinking Ensure supplements. Will liberalize diet as well to help improve intakes.  Noted MD is checking vitamin D, B-12 and folate labs.  Per weight records, pt with 23 lbs since of weight loss since 6/17 (11% wt loss  x 2 months, significant for time frame).   Medications reviewed.  Labs reviewed:  Low Na Low K  NUTRITION - FOCUSED PHYSICAL EXAM:  Flowsheet Row Most Recent Value  Orbital Region Mild depletion  Upper Arm Region Mild depletion  Thoracic and Lumbar Region Unable to assess  Buccal Region No depletion  Temple Region Mild depletion  Clavicle Bone Region Mild depletion  Clavicle and Acromion Bone Region Mild depletion  Scapular Bone Region Mild depletion  Dorsal Hand No depletion  Patellar Region Unable to  assess  Anterior Thigh Region Unable to assess  Posterior Calf Region Unable to assess  Edema (RD Assessment) None  Hair Reviewed  Eyes Reviewed  Mouth Reviewed  Skin Reviewed       Diet Order:   Diet Order             Diet Heart Fluid consistency: Thin  Diet effective now                   EDUCATION NEEDS:   No education needs have been identified at this time  Skin:  Skin Assessment: Reviewed RN Assessment  Last BM:  PTA  Height:   Ht Readings from Last 1 Encounters:  11/12/22 6\' 3"  (1.905 m)    Weight:   Wt Readings from Last 1 Encounters:  11/12/22 80 kg    BMI:  Body mass index is 22.04 kg/m.  Estimated Nutritional Needs:   Kcal:  2000-2200  Protein:  90-100g  Fluid:  2L/day   Aaron Franco, MS, RD, LDN Inpatient Clinical Dietitian Contact information available via Amion

## 2022-11-13 NOTE — Plan of Care (Signed)

## 2022-11-13 NOTE — Evaluation (Signed)
Physical Therapy Evaluation Patient Details Name: Aaron [name] MRN: [id number] DOB: May 13, 1935 Today's Date: 11/13/2022  History of Present Illness  87 yo male admitted with AKI, weakness. Hx of adv met prostate Ca  Clinical Impression  On eval, pt required Mod A for mobility. He stood at bedside with RW x 2. Pt presents with general weakness, decreased activity tolerance, and impaired gait and balance. Fall risk when mobilizing. No family present during session. Per chart review, plan appears to be for home. Recommend HHPT f/u. If family is unable to provide current level of care, may need to consider ST SNF.         If plan is discharge home, recommend the following: Assistance with cooking/housework;Assist for transportation;Help with stairs or ramp for entrance;A lot of help with walking and/or transfers;A lot of help with bathing/dressing/bathroom   Can travel by private vehicle        Equipment Recommendations None recommended by PT  Recommendations for Other Services  OT consult    Functional Status Assessment Patient has had a recent decline in their functional status and/or demonstrates limited ability to make significant improvements in function in a reasonable and predictable amount of time     Precautions / Restrictions Precautions Precautions: Fall Restrictions Weight Bearing Restrictions: No      Mobility  Bed Mobility Overal bed mobility: Needs Assistance Bed Mobility: Supine to Sit, Sit to Supine     Supine to sit: Min assist, HOB elevated, Used rails Sit to supine: HOB elevated, Used rails, Mod assist   General bed mobility comments: Assist for trunk and bil LEs. Increased time. Cues for safety, technique.    Transfers Overall transfer level: Needs assistance Equipment used: Rolling walker (2 wheels) Transfers: Sit to/from Stand Sit to Stand: From elevated surface, Mod assist           General transfer comment: x 2. Assist to shift weight  anteriorly, power up, stabilize, control descent. Cues for safety, technique, hand/feet placement. Increased time. Pt took a few side steps along the bedside with a RW. He is weak and tends to sit pretty abruptly. Difficulty maintaining extension of trunk/hips,knees. High fall risk.    Ambulation/Gait                  Stairs            Wheelchair Mobility     Tilt Bed    Modified Rankin (Stroke Patients Only)       Balance Overall balance assessment: Needs assistance         Standing balance support: Bilateral upper extremity supported, During functional activity Standing balance-Leahy Scale: Poor                               Pertinent Vitals/Pain Pain Assessment Pain Assessment: No/denies pain    Home Living Family/patient expects to be discharged to:: Private residence Living Arrangements: Spouse/significant other;Other relatives Available Help at Discharge: Family Type of Home: House Home Access: Stairs to enter         Home Equipment: Agricultural consultant (2 wheels)      Prior Function Prior Level of Function : Needs assist             Mobility Comments: per pt, uses walker with assistance ADLs Comments: requires assistance     Extremity/Trunk Assessment   Upper Extremity Assessment Upper Extremity Assessment: Defer to OT evaluation    Lower Extremity  Assessment Lower Extremity Assessment: Generalized weakness (Strength at least ~3/5 throughout)       Communication   Communication Communication: No apparent difficulties  Cognition Arousal: Alert Behavior During Therapy: WFL for tasks assessed/performed Overall Cognitive Status: No family/caregiver present to determine baseline cognitive functioning                                          General Comments      Exercises     Assessment/Plan    PT Assessment Patient needs continued PT services  PT Problem List Decreased strength;Decreased  activity tolerance;Decreased balance;Decreased mobility;Decreased range of motion;Decreased coordination;Decreased cognition;Decreased knowledge of use of DME       PT Treatment Interventions DME instruction;Gait training;Functional mobility training;Therapeutic activities;Therapeutic exercise;Balance training;Patient/family education    PT Goals (Current goals can be found in the Care Plan section)  Acute Rehab PT Goals Patient Stated Goal: none stated PT Goal Formulation: With patient Time For Goal Achievement: 11/27/22 Potential to Achieve Goals: Fair    Frequency Min 1X/week     Co-evaluation               AM-PAC PT "6 Clicks" Mobility  Outcome Measure Help needed turning from your back to your side while in a flat bed without using bedrails?: A Little Help needed moving from lying on your back to sitting on the side of a flat bed without using bedrails?: A Little Help needed moving to and from a bed to a chair (including a wheelchair)?: Total Help needed standing up from a chair using your arms (e.g., wheelchair or bedside chair)?: A Lot Help needed to walk in hospital room?: Total Help needed climbing 3-5 steps with a railing? : Total 6 Click Score: 11    End of Session Equipment Utilized During Treatment: Gait belt Activity Tolerance: Patient tolerated treatment well Patient left: in bed;with call bell/phone within reach;with bed alarm set        Time: 0981-1914 PT Time Calculation (min) (ACUTE ONLY): 16 min   Charges:   PT Evaluation $PT Eval Low Complexity: 1 Low   PT General Charges $$ ACUTE PT VISIT: 1 Visit            Faye Ramsay, PT Acute Rehabilitation  Office: [phone]

## 2022-11-14 DIAGNOSIS — N179 Acute kidney failure, unspecified: Secondary | ICD-10-CM | POA: Diagnosis not present

## 2022-11-14 LAB — COMPREHENSIVE METABOLIC PANEL
ALT: 10 U/L (ref 0–44)
AST: 23 U/L (ref 15–41)
Albumin: 3.2 g/dL — ABNORMAL LOW (ref 3.5–5.0)
Alkaline Phosphatase: 83 U/L (ref 38–126)
Anion gap: 12 (ref 5–15)
BUN: 24 mg/dL — ABNORMAL HIGH (ref 8–23)
CO2: 24 mmol/L (ref 22–32)
Calcium: 11.1 mg/dL — ABNORMAL HIGH (ref 8.9–10.3)
Chloride: 93 mmol/L — ABNORMAL LOW (ref 98–111)
Creatinine, Ser: 1.78 mg/dL — ABNORMAL HIGH (ref 0.61–1.24)
GFR, Estimated: 37 mL/min — ABNORMAL LOW (ref >60)
Glucose, Bld: 103 mg/dL — ABNORMAL HIGH (ref 70–99)
Potassium: 3.3 mmol/L — ABNORMAL LOW (ref 3.5–5.1)
Sodium: 129 mmol/L — ABNORMAL LOW (ref 135–145)
Total Bilirubin: 0.4 mg/dL (ref 0.3–1.2)
Total Protein: 7.4 g/dL (ref 6.5–8.1)

## 2022-11-14 LAB — CBC
HCT: 27.3 % — ABNORMAL LOW (ref 39.0–52.0)
Hemoglobin: 8.9 g/dL — ABNORMAL LOW (ref 13.0–17.0)
MCH: 30.4 pg (ref 26.0–34.0)
MCHC: 32.6 g/dL (ref 30.0–36.0)
MCV: 93.2 fL (ref 80.0–100.0)
Platelets: 119 10*3/uL — ABNORMAL LOW (ref 150–400)
RBC: 2.93 MIL/uL — ABNORMAL LOW (ref 4.22–5.81)
RDW: 14 % (ref 11.5–15.5)
WBC: 4.6 10*3/uL (ref 4.0–10.5)
nRBC: 0 % (ref 0.0–0.2)

## 2022-11-14 LAB — PHOSPHORUS: Phosphorus: 2.7 mg/dL (ref 2.5–4.6)

## 2022-11-14 LAB — FOLATE: Folate: 8.4 ng/mL (ref >5.9)

## 2022-11-14 LAB — MAGNESIUM: Magnesium: 2 mg/dL (ref 1.7–2.4)

## 2022-11-14 MED ORDER — POTASSIUM CHLORIDE CRYS ER 20 MEQ PO TBCR
40.0000 meq | EXTENDED_RELEASE_TABLET | ORAL | Status: AC
  Administered 2022-11-14 (×3): 40 meq via ORAL
  Filled 2022-11-14 (×3): qty 2

## 2022-11-14 NOTE — Progress Notes (Signed)
IP PROGRESS NOTE  Subjective:   Aaron [name] denies pain.  He has no complaint.   Objective: Vital signs in last 24 hours: Blood pressure 108/70, pulse 94, temperature 100.2 F (37.9 C), temperature source Oral, resp. rate 18, height 6\' 3"  (1.905 m), weight 176 lb 5.9 oz (80 kg), SpO2 97%.  Intake/Output from previous day: 08/20 0701 - 08/21 0700 In: 644 [P.O.:360; I.V.:283.7; IV Piggyback:0.3] Out: 500 [Urine:500]  Physical Exam:  HEENT: Thrush over the tongue and left buccal mucosa  Abdomen: Nontender, no hepatosplenomegaly Extremities: No leg edema Neurologic: Oriented to month, not year, place, or diagnosis.  Follows commands.  Moves all extremities to command.  The leg strength appears intact bilaterally   Lab Results: Recent Labs    11/12/22 1825 11/14/22 0501  WBC 5.1 4.6  HGB 10.4* 8.9*  HCT 31.5* 27.3*  PLT 120* 119*    BMET Recent Labs    11/13/22 0217 11/14/22 0501  NA 129* 129*  K 3.4* 3.3*  CL 93* 93*  CO2 25 24  GLUCOSE 96 103*  BUN 27* 24*  CREATININE 1.93* 1.78*  CALCIUM 11.8* 11.1*    No results found for: "CEA1", "CEA", "WUJ811", "CA125"  Studies/Results: MR TOTAL SPINE METS SCREENING  Result Date: 11/13/2022 CLINICAL DATA:  Prostate cancer, metastatic to bone EXAM: MRI TOTAL SPINE WITHOUT AND WITH CONTRAST TECHNIQUE: Multisequence MR imaging of the spine from the cervical spine to the sacrum was performed prior to and following IV contrast administration for evaluation of spinal metastatic disease. CONTRAST:  8mL GADAVIST GADOBUTROL 1 MMOL/ML IV SOLN COMPARISON:  No prior MRI of the spine available, correlation is made with 11/12/2022 CT lumbar spine and 10/18/2022 PET-CT FINDINGS: MRI CERVICAL SPINE FINDINGS Alignment: No significant listhesis. Preservation of the normal cervical lordosis. Vertebrae: Diffusely abnormal marrow signal throughout the cervical spine, which correlates with the diffuse metabolically active lesions seen on the  recent PET-CT. Vertebral body heights appear preserved. The axial sequences are somewhat motion limited, however there does not appear to be cord compression secondary to epidural extension of tumor. Cord: Normal signal and morphology.  No abnormal enhancement. Posterior Fossa, vertebral arteries, paraspinal tissues: Grossly normal. Disc levels: C3-C4: Moderate disc bulge. Facet and uncovertebral hypertrophy. Ligamentum flavum hypertrophy. Moderate to severe spinal canal stenosis. Severe right and moderate to severe left neural foraminal narrowing. C4-C5: Moderate disc bulge. Facet and uncovertebral hypertrophy. Mild-to-moderate spinal canal stenosis. Severe right and moderate to severe left neural foraminal narrowing. C5-C6: Moderate disc bulge. Facet and uncovertebral hypertrophy. Ligamentum flavum hypertrophy. Moderate to severe spinal canal stenosis. Moderate right and mild left neural foraminal narrowing. Milder degenerative changes at other cervical levels. MRI THORACIC SPINE FINDINGS Alignment: No listhesis. Mild S-shaped curvature of the thoracolumbar spine. Vertebrae: Diffusely abnormal marrow signal throughout the thoracic spine. No definite acute fracture. No epidural extension of tumor. Cord:  Normal spinal cord signal.  No abnormal enhancement. Paraspinal and other soft tissues: No acute finding. Disc levels: No significant spinal canal stenosis. MRI LUMBAR SPINE FINDINGS Segmentation: 5 lumbar type vertebral bodies. Lumbarization of S1. The last fully formed disc space is labeled S1-S2. Alignment: Trace anterolisthesis of L5 on S1. S-shaped curvature of the thoracolumbar spine. Vertebrae: Diffusely abnormal marrow signal. No evidence of acute fracture or epidural extension of tumor. Conus medullaris: Extends to the L1 level and appears normal. No abnormal enhancement. Paraspinal and other soft tissues: No definite acute finding given only sagittal images. Redemonstrated bladder diverticulum. Disc  levels: No high-grade spinal canal stenosis.  IMPRESSION: 1. Diffusely abnormal marrow signal throughout the spine, which correlates with the diffuse metabolically active lesions seen on the 10/18/2022 PET-CT. 2. No evidence of epidural extension of tumor or pathologic cord compression. 3. Degenerative changes result in moderate to severe spinal canal stenosis at C3-C4 and C5-C6. 4. Multilevel neural foraminal narrowing, which is severe on the right at C3-C4 and C4-C5 and moderate to severe on the left at C3-C4 and C4-C5. 5. No high-grade spinal canal stenosis in the thoracic or lumbar spine. Electronically Signed   By: Wiliam Ke M.D.   On: 11/13/2022 00:36   CT Lumbar Spine Wo Contrast  Result Date: 11/12/2022 CLINICAL DATA:  Fall, back pain EXAM: CT LUMBAR SPINE WITHOUT CONTRAST TECHNIQUE: Multidetector CT imaging of the lumbar spine was performed without intravenous contrast administration. Multiplanar CT image reconstructions were also generated. RADIATION DOSE REDUCTION: This exam was performed according to the departmental dose-optimization program which includes automated exposure control, adjustment of the mA and/or kV according to patient size and/or use of iterative reconstruction technique. COMPARISON:  Plain films today. FINDINGS: Segmentation: 5 lumbar type vertebrae. Alignment: 4 mm anterolisthesis of L4 on L5 related to degenerative facet disease. Vertebrae: Slight compression fracture the superior endplate of T12. This is age indeterminate. No additional fracture. Diffuse osteopenia. Paraspinal and other soft tissues: Paraspinal soft tissues negative. Large posterior bladder wall diverticulum on the right. Aortic atherosclerosis without aneurysm. Bilateral renal cysts. Disc levels: Advanced degenerative facet disease, most pronounced from L3-4 through L5-S1. no disc herniation. IMPRESSION: Slight compression fracture through the anterior superior endplate of T12, age indeterminate.  Degenerative changes, most pronounced in the facets from L3-4 through L5-S1. Electronically Signed   By: Charlett Nose M.D.   On: 11/12/2022 19:21   DG Lumbar Spine 2-3 Views  Result Date: 11/12/2022 CLINICAL DATA:  Fall EXAM: LUMBAR SPINE - 2-3 VIEW COMPARISON:  None Available. FINDINGS: There is 4 mm of anterolisthesis at L4-L5 which is favored as degenerative. Alignment is otherwise anatomic. There is no acute fracture. There significant degenerative changes of facet joints throughout the lumbar spine. There are atherosclerotic calcifications of the aorta. IMPRESSION: 1. No acute fracture. 2. Grade 1 anterolisthesis at L4-L5 is favored as degenerative. Electronically Signed   By: Darliss Cheney M.D.   On: 11/12/2022 19:09   DG Pelvis 1-2 Views  Result Date: 11/12/2022 CLINICAL DATA:  Fall EXAM: PELVIS - 1-2 VIEW COMPARISON:  None Available. FINDINGS: Hip joints and SI joints symmetric. No acute bony abnormality. Specifically, no fracture, subluxation, or dislocation. IMPRESSION: No acute bony abnormality. Electronically Signed   By: Charlett Nose M.D.   On: 11/12/2022 19:07   CT Head Wo Contrast  Result Date: 11/12/2022 CLINICAL DATA:  Mental status changes.  Fall. EXAM: CT HEAD WITHOUT CONTRAST TECHNIQUE: Contiguous axial images were obtained from the base of the skull through the vertex without intravenous contrast. RADIATION DOSE REDUCTION: This exam was performed according to the departmental dose-optimization program which includes automated exposure control, adjustment of the mA and/or kV according to patient size and/or use of iterative reconstruction technique. COMPARISON:  None Available. FINDINGS: Brain: There is atrophy and chronic small vessel disease changes. No acute intracranial abnormality. Specifically, no hemorrhage, hydrocephalus, mass lesion, acute infarction, or significant intracranial injury. Vascular: No hyperdense vessel or unexpected calcification. Skull: No acute calvarial  abnormality. Sinuses/Orbits: No acute findings Other: None IMPRESSION: Atrophy, chronic microvascular disease. No acute intracranial abnormality. Electronically Signed   By: Charlett Nose M.D.   On: 11/12/2022  19:03   DG Chest Port 1 View  Result Date: 11/12/2022 CLINICAL DATA:  Fall, weakness.  History of COPD, former smoker. EXAM: PORTABLE CHEST 1 VIEW COMPARISON:  04/20/2020 FINDINGS: The heart size and mediastinal contours are within normal limits. Both lungs are clear. The visualized skeletal structures are unremarkable. IMPRESSION: No active disease. Electronically Signed   By: Charlett Nose M.D.   On: 11/12/2022 19:03    Medications: I have reviewed the patient's current medications.  Assessment/Plan: Squamous cell carcinoma of the esophagus, status post an endoscopic biopsy, 04/07/2009, with the pathology confirming at least superficial invasion. PET scan, 04/20/2009, confirmed hypermetabolic activity associated with the upper and thoracic esophagus mass with a single hypermetabolic right paratracheal lymph node. He began concurrent radiation and weekly Taxol/carboplatin chemotherapy, 05/03/2009. The last treatment with Taxol/carboplatin was given 06/14/2009. He completed radiation on 06/16/2009.   Solid/liquid dysphagia secondary to the proximal esophageal mass, resolved. He is status post an esophageal dilatation for management of a stricture, 08/24/2009, repeat esophageal dilatation 10/03/2011. Borderline enlarged mediastinal and gastrohepatic lymph nodes on staging CT scans. PET scan 04/20/2009 confirmed hypermetabolic activity associated with the right paratracheal lymph node. Hypertension. Status post placement of a feeding jejunostomy tube by Dr. Michaell Cowing. The jejunostomy tube has been removed. Admission with neutropenia and anemia, March 2011.   Fever of unknown origin, April 2011, resolved. He was treated with prednisone beginning on 07/13/2009 for presumed radiation pneumonitis. He  developed episodes of rigors on prednisone, and the prednisone was discontinued, 07/15/2009. A CT scan did not reveal evidence for an infectious process. Zoster rash at the right back and anterior chest July 2013. Questionable leukoplakia on exam 04/09/2013-he was evaluated by dental medicine Prostate cancer-Gleason 9 prostate cancer diagnosed in June 2022 Orgovyx August 2022, and apalutamide, September 2022 on the HERO trial 01/12/2021 - 03/14/2021 external beam radiation, 45 Gy in 25 fractions to prostate, seminal vesicles, and pelvic lymph nodes, prostate only boosted to 75 Gy with 15 additional fractions of 2 Gy nodes Rising PSA May 2024 09/11/2022 PSA 12.7, testosterone 16.9 PSMA PET 10/18/2022-significant decrease in hypermetabolism associated with the prostate, abdominopelvic lymph nodes, and seminal vesicles, development of diffuse osseous and probable thoracic lymph node metastases MRI total spine 11/13/2022-diffuse metastatic prostate cancer involving the spine, no evidence of epidural tumor extension or cord compression, spinal stenosis at C3-C4 and C5-C6 secondary to degenerative changes 12.  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  He presented with generalized weakness and altered mental status.  The altered mental status is likely related to delirium from metastatic prostate cancer and hypercalcemia.  He has a poor prognosis.  We had planned a trial of Pluvicto radiotherapy in an attempt to improve the metastatic prostate cancer and extend survival.  I am concerned his performance status will not allow for further treatment of the prostate cancer.  I began a discussion regarding hospice care with Mr. Ambert.  His wife and daughter were not present this morning.  The calcium has improved with intravenous hydration and Zometa.  We can monitor for improvement in his mental status and activity level as the calcium further  improves.  The anemia and thrombocytopenia are likely secondary to extensive metastatic prostate cancer involving the bone marrow.  Recommendations: Continue intravenous hydration Check calcium level 11/15/2022 Continue physical therapy Begin discussions with his wife regarding skilled nursing facility placement and hospice care Continue Relugolix Nystatin for oral candidiasis  LOS: 1 day   Thornton Papas, MD   11/14/2022, 6:34 AM

## 2022-11-14 NOTE — Plan of Care (Signed)
  Problem: Nutrition: Goal: Adequate nutrition will be maintained Outcome: Progressing   Problem: Pain Managment: Goal: General experience of comfort will improve Outcome: Progressing   Problem: Safety: Goal: Ability to remain free from injury will improve Outcome: Progressing   

## 2022-11-14 NOTE — Evaluation (Signed)
Occupational Therapy Evaluation Patient Details Name: Aaron [name] MRN: [id number] DOB: 12-10-1935 Today's Date: 11/14/2022   History of Present Illness 87 yo male admitted with AKI, weakness. Hx of adv met prostate Ca   Clinical Impression   Pt presents with decline in function and safety with ADLs and ADL mobility with impaired strength, balance and endurance. Pt is a poor historian and no caregiver or family present to provide PLOF info. Pt with some confusion and currently requires min - mod A with bed mobility, max a with LB ADLs, min A with UB ADLs, mod A with sit - stand and SPTs to [facility] using RW. Pt required mi verbal anc tactile cue for safety throughout session. Recommend HH OT f/u, however if family is unable to provide current level of care, may need to consider ST SNF.        If plan is discharge home, recommend the following: A lot of help with bathing/dressing/bathroom;A lot of help with walking and/or transfers;Direct supervision/assist for medications management;Assist for transportation;Help with stairs or ramp for entrance    Functional Status Assessment  Patient has had a recent decline in their functional status and demonstrates the ability to make significant improvements in function in a reasonable and predictable amount of time.  Equipment Recommendations  None recommended by OT    Recommendations for Other Services       Precautions / Restrictions Precautions Precautions: Fall Restrictions Weight Bearing Restrictions: No      Mobility Bed Mobility Overal bed mobility: Needs Assistance Bed Mobility: Supine to Sit, Sit to Supine     Supine to sit: Min assist, HOB elevated, Used rails Sit to supine: HOB elevated, Used rails, Mod assist   General bed mobility comments: Assist for trunk and bil LEs. Increased time. Cues for safety, technique. Patient Response: Flat affect  Transfers Overall transfer level: Needs assistance Equipment used: Rolling  walker (2 wheels) Transfers: Sit to/from Stand Sit to Stand: From elevated surface, Mod assist           General transfer comment: Cues for safety, technique, hand/feet placement. Increased time. Pt took a few side steps along the bedside with a W toward rail/HOB.  Difficulty maintaining extension of trunk/hips,knees. High fall risk.      Balance Overall balance assessment: Needs assistance Sitting-balance support: No upper extremity supported, Feet supported Sitting balance-Leahy Scale: Fair     Standing balance support: Bilateral upper extremity supported, During functional activity Standing balance-Leahy Scale: Poor                             ADL either performed or assessed with clinical judgement   ADL Overall ADL's : Needs assistance/impaired Eating/Feeding: Set up;Sitting;Bed level   Grooming: Wash/dry hands;Wash/dry face;Contact guard assist;Sitting   Upper Body Bathing: Minimal assistance;Sitting   Lower Body Bathing: Maximal assistance   Upper Body Dressing : Minimal assistance;Sitting   Lower Body Dressing: Maximal assistance   Toilet Transfer: Moderate assistance;Rolling walker (2 wheels);BSC/3in1;Cueing for safety;Cueing for sequencing Toilet Transfer Details (indicate cue type and reason): heavy mod A SPT to [facility] Toileting- Clothing Manipulation and Hygiene: Total assistance       Functional mobility during ADLs: Moderate assistance;Rolling walker (2 wheels);Cueing for safety;Cueing for sequencing       Vision Ability to See in Adequate Light: 0 Adequate Patient Visual Report: No change from baseline       Perception  Praxis         Pertinent Vitals/Pain Pain Assessment Pain Assessment: No/denies pain     Extremity/Trunk Assessment Upper Extremity Assessment Upper Extremity Assessment: Generalized weakness   Lower Extremity Assessment Lower Extremity Assessment: Defer to PT evaluation       Communication  Communication Communication: No apparent difficulties Cueing Techniques: Verbal cues;Tactile cues   Cognition Arousal: Alert Behavior During Therapy: WFL for tasks assessed/performed Overall Cognitive Status: No family/caregiver present to determine baseline cognitive functioning Area of Impairment: Memory, Following commands, Safety/judgement                       Following Commands: Follows one step commands with increased time, Follows one step commands inconsistently             General Comments       Exercises     Shoulder Instructions      Home Living Family/patient expects to be discharged to:: Private residence Living Arrangements: Spouse/significant other;Other relatives Available Help at Discharge: Family Type of Home: House Home Access: Stairs to enter     Home Layout: Two level     Bathroom Shower/Tub: Walk-in shower         Home Equipment: Agricultural consultant (2 wheels)          Prior Functioning/Environment               Mobility Comments: per pt, uses walker with assistance ADLs Comments: requires assistance        OT Problem List: Decreased strength;Impaired balance (sitting and/or standing);Decreased cognition;Decreased safety awareness;Decreased activity tolerance;Decreased coordination;Decreased knowledge of use of DME or AE      OT Treatment/Interventions: Self-care/ADL training;DME and/or AE instruction    OT Goals(Current goals can be found in the care plan section) Acute Rehab OT Goals Patient Stated Goal: none stated OT Goal Formulation: With patient Time For Goal Achievement: 11/28/22 Potential to Achieve Goals: Good  OT Frequency: Min 2X/week    Co-evaluation              AM-PAC OT "6 Clicks" Daily Activity     Outcome Measure Help from another person eating meals?: A Little Help from another person taking care of personal grooming?: A Little Help from another person toileting, which includes using toliet,  bedpan, or urinal?: Total Help from another person bathing (including washing, rinsing, drying)?: A Lot Help from another person to put on and taking off regular upper body clothing?: A Little Help from another person to put on and taking off regular lower body clothing?: A Lot 6 Click Score: 14   End of Session Equipment Utilized During Treatment: Gait belt;Rolling walker (2 wheels);Other (comment) (BSC)  Activity Tolerance: Patient tolerated treatment well Patient left: in bed;with call bell/phone within reach;with bed alarm set  OT Visit Diagnosis: Unsteadiness on feet (R26.81);Other abnormalities of gait and mobility (R26.89);Muscle weakness (generalized) (M62.81);Other symptoms and signs involving cognitive function                Time: 4540-9811 OT Time Calculation (min): 26 min Charges:  OT General Charges $OT Visit: 1 Visit OT Evaluation $OT Eval Low Complexity: 1 Low OT Treatments $Therapeutic Activity: 8-22 mins    Galen Manila 11/14/2022, 2:33 PM

## 2022-11-14 NOTE — Consult Note (Addendum)
Consultation Note Date: 11/14/2022   Patient Name: Aaron [name]  DOB: 04-Mar-1936  MRN: [id number]  Age / Sex: 87 y.o., male  PCP: Plotnikov, Georgina Quint, MD Referring Physician: Miguel Rota, MD  Reason for Consultation: Establishing goals of care  HPI/Patient Profile: 87 y.o. male admitted on 11/12/2022.   Clinical Assessment and Goals of Care: 87 year old gentleman who lives at home with his wife, history of esophageal cancer treated in 2011 with chemoradiation.  History of prostate cancer diagnosed in June 2022 on radiation/hormonal therapy.  Found to have evidence of widely metastatic prostate cancer October 18, 2022.  Followed closely by medical oncology with Dr. Truett Perna.  Admitted to hospital medicine service for ongoing decline in functional status, metastatic prostate cancer widely metastatic as well as hypercalcemia of malignancy. Started on IV fluids and bisphosphonate therapy. Followed closely by Dr. Truett Perna and with the most recent recommendations for consideration for hospice services. Palliative consult requested for establishing goals of care. Chart reviewed, palliative consult request received, patient seen and examined. Palliative medicine is specialized medical care for people living with serious illness. It focuses on providing relief from the symptoms and stress of a serious illness. The goal is to improve quality of life for both the patient and the family. Goals of care: Broad aims of medical therapy in relation to the patient's values and preferences. Our aim is to provide medical care aimed at enabling patients to achieve the goals that matter most to them, given the circumstances of their particular medical situation and their constraints.  NEXT OF KIN  Spouse Aaron [name] [phone]   Discussion/SUMMARY OF RECOMMENDATIONS     Attempted to discuss goals of care with patient.  He does  recall seeing Dr. Truett Perna.  He is not able to describe the full extent of his current condition.  He believes that further scans will be done in the future to determine further treatments.  Call placed, unable to reach his spouse.  PMT will reattempt on 11-15-2022.  Continue current mode of care.  Appreciate TOC follow-up, note reviewed.  PMT to follow.  Addendum: 1430 on 11-14-22: Discussed with Ms Aaron [name], she has also discussed with [facility] colleague. She plans on bringing the patient home towards the end of this hospitalization with home health care and home PT attempt, recommend home based palliative services to start with, and then to transition to home with hospice after home PT is done. PMT to follow up on 11-15-22.   Code Status/Advance Care Planning: Full code   Symptom Management:     Palliative Prophylaxis:  Frequent Pain Assessment  Additional Recommendations (Limitations, Scope, Preferences): Full Scope Treatment  Psycho-social/Spiritual:  Desire for further Chaplaincy support:yes Additional Recommendations: Caregiving  Support/Resources  Prognosis:  Unable to determine  Discharge Planning: To Be Determined      Primary Diagnoses: Present on Admission:  Hypercalcemia  Hypothyroidism  CRI (chronic renal insufficiency), stage 3 (moderate) (HCC)  AKI (acute kidney injury) (HCC)  Malignant neoplasm of prostate (HCC)  Malignant neoplasm of thoracic  esophagus (HCC)   I have reviewed the medical record, interviewed the patient and family, and examined the patient. The following aspects are pertinent.  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, now removed    Esophageal stricture    Gastritis     GERD (gastroesophageal reflux disease)    Hemorrhoids    Hiatal hernia    History of chemotherapy 05/03/09-06/16/09   Taxol/carboplatin   History of nuclear stress test 07/2008   exercise; mild-mod perfusion dfect in basal inf/mid inf/apical inf regions; EKG negative for ischemia   History of radiation therapy 05/03/09-06/16/09   squamous cell ca of esophagus   Hyperlipidemia    Dr Tresa Endo   Hypothyroid    s/p radiotherapy   Prostate cancer [facility])    Social History   Socioeconomic History   Marital status: Married    Spouse name: Not on file   Number of children: 3   Years of education: 12th   Highest education level: Not on file  Occupational History   Occupation: Retired, [place] [employer] 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  Tobacco Use   Smoking status: Former    Current packs/day: 0.00    Average packs/day: 1 pack/day for 40.0 years (40.0 ttl pk-yrs)    Types: Cigarettes    Start date: 01/16/1961    Quit date: 01/16/2001    Years since quitting: 21.8   Smokeless tobacco: Never   Tobacco comments:    Quit 2002  Vaping Use   Vaping status: Never Used  Substance and Sexual Activity   Alcohol use: Yes    Alcohol/week: 0.0 standard drinks of alcohol    Comment: occasional drink 2-3 a month   Drug use: No    Comment: quit smoking 2002   Sexual activity: Yes  Other Topics Concern   Not on file  Social History Narrative   Daily Caffeine - 1      Social Determinants of Health   Financial Resource Strain: Low Risk  (09/10/2022)   Overall Financial Resource Strain (CARDIA)    Difficulty of Paying Living Expenses: Not hard at all  Food Insecurity: No Food Insecurity (11/12/2022)   Hunger Vital Sign    Worried About Running Out of Food in the Last Year: Never true    Ran Out of Food in the Last Year: Never true  Transportation Needs: No Transportation Needs (11/12/2022)   PRAPARE - Administrator, Civil Service (Medical): No    Lack of  Transportation (Non-Medical): No  Physical Activity: Inactive (09/10/2022)   Exercise Vital Sign    Days of Exercise per Week: 0 days    Minutes of Exercise per Session: 0 min  Stress: No Stress Concern Present (09/10/2022)   [employer] of Occupational Health - Occupational Stress Questionnaire    Feeling of Stress : Not at all  Social Connections: Socially Integrated (09/10/2022)   Social Connection and Isolation Panel [NHANES]    Frequency of Communication with Friends and Family: More than three times a week    Frequency of Social Gatherings with Friends and Family: More than  three times a week    Attends Religious Services: More than 4 times per year    Active Member of Clubs or Organizations: Yes    Attends Banker Meetings: 1 to 4 times per year    Marital Status: Married   Family History  Problem Relation Age of Onset   Prostate cancer Father        10's   Diabetes Father    Stroke Mother        aneurism   Stroke Maternal Grandfather 35   Prostate cancer Brother 74   Colon polyps Brother    Prostate cancer Brother 33   Colon cancer Sister    Coronary artery disease Neg Hx    Hypertension Neg Hx    Stomach cancer Neg Hx    Rectal cancer Neg Hx    Scheduled Meds:  aspirin EC  81 mg Oral Daily   enoxaparin (LOVENOX) injection  40 mg Subcutaneous Q24H   feeding supplement  237 mL Oral BID BM   latanoprost  1 drop Left Eye QHS   levothyroxine  150 mcg Oral Q0600   multivitamin with minerals  1 tablet Oral Daily   nystatin  5 mL Oral QID   potassium chloride  40 mEq Oral Q4H   relugolix  240 mg Oral Daily   simvastatin  10 mg Oral Daily   Continuous Infusions:  sodium chloride 75 mL/hr at 11/14/22 0527   PRN Meds:.acetaminophen **OR** acetaminophen, guaiFENesin, hydrALAZINE, ipratropium-albuterol, metoprolol tartrate, ondansetron **OR** ondansetron (ZOFRAN) IV, senna-docusate, traZODone Medications Prior to Admission:  Prior to Admission  medications   Medication Sig Start Date End Date Taking? Authorizing Provider  apalutamide (ERLEADA) 60 MG tablet Take 240 mg by mouth at bedtime. May be taken with or without food. Swallow tablets whole.   Yes [provider]  aspirin 81 MG EC tablet Take 81 mg by mouth daily.   Yes [provider]  clonazePAM (KLONOPIN) 1 MG tablet TAKE 1/2 TO 1 TABLET BY MOUTH  TWICE DAILY AS NEEDED FOR  ANXIETY 06/05/22  Yes Plotnikov, Georgina Quint, MD  docusate sodium (COLACE) 100 MG capsule Take 100 mg by mouth daily as needed for moderate constipation.   Yes [provider]  latanoprost (XALATAN) 0.005 % ophthalmic solution Place 1 drop into the left eye at bedtime. 10/24/21  Yes [provider]  levothyroxine (SYNTHROID) 150 MCG tablet Take 1 tablet (150 mcg total) by mouth daily. 09/11/22  Yes Plotnikov, Georgina Quint, MD  metoprolol succinate (TOPROL-XL) 25 MG 24 hr tablet TAKE ONE-HALF TABLET BY MOUTH  ALTERNATING WITH 1/4 OF A TABLET BY MOUTH DAILY 05/17/22  Yes Lennette Bihari, MD  omeprazole (PRILOSEC) 40 MG capsule TAKE 1 CAPSULE BY MOUTH DAILY 08/07/22  Yes Meryl Dare, MD  Relugolix (ORGOVYX) 120 MG TABS 1 po qd Patient taking differently: Take 240 mg by mouth in the morning. 2 pills in AM 12/14/20  Yes Plotnikov, Georgina Quint, MD  simvastatin (ZOCOR) 20 MG tablet Take 0.5 tablets (10 mg total) by mouth daily. 05/21/22  Yes Plotnikov, Georgina Quint, MD   Allergies  Allergen Reactions   Amoxicillin     REACTION: rash   Cefuroxime Axetil     REACTION: nit feeling well   Fluconazole     REACTION: shaking   Escitalopram Other (See Comments)    Nightmares, numbness in tongue, dizziness   Review of Systems +weakness  Physical Exam Awake alert Sitting up in bed Able to  feed himself applesauce Answers a few yes/no type questions appropriately, unable to have full goals of care discussions. Regular work of breathing  Vital Signs: BP 104/66 (BP Location: Right Arm)    Pulse 93   Temp (!) 97.5 F (36.4 C) (Oral)   Resp 17   Ht 6\' 3"  (1.905 m)   Wt 80 kg   SpO2 97%   BMI 22.04 kg/m  Pain Scale: 0-10   Pain Score: 0-No pain   SpO2: SpO2: 97 % O2 Device:SpO2: 97 % O2 Flow Rate: .   IO: Intake/output summary:  Intake/Output Summary (Last 24 hours) at 11/14/2022 1340 Last data filed at 11/13/2022 1856 Gross per 24 hour  Intake 523.98 ml  Output 500 ml  Net 23.98 ml    LBM: Last BM Date :  (UTA) Baseline Weight: Weight: 81 kg Most recent weight: Weight: 80 kg     Palliative Assessment/Data:   PPS 50%  Time In:  1300 Time Out:  1400 Time Total:  60 Greater than 50%  of this time was spent counseling and coordinating care related to the above assessment and plan.  Signed by: Rosalin Hawking, MD   Please contact Palliative Medicine Team phone at [phone] for questions and concerns.  For individual provider: See Loretha Stapler

## 2022-11-14 NOTE — Progress Notes (Signed)
PROGRESS NOTE    Aaron [name]  WGN:[id number] DOB: 1935/11/30 DOA: 11/12/2022 PCP: Tresa Garter, MD     Brief Narrative:  87 year old with history of esophageal cancer treated in 2011 with chemoradiation, inoperable but apparently long-term remission, prostate cancer June 2022 on radiation/hormonal therapy which has recently progressed and became widely metastatic as of October 18, 2022.  Seen by oncology on the day of admission patient has become significantly weaker therefore comes to the hospital. Admitted for hyperCal, started on IVF, and Bisphos therapy.      Assessment & Plan:  Principal Problem:   AKI (acute kidney injury) (HCC) Active Problems:   Malignant neoplasm of prostate (HCC)   Hypercalcemia   CRI (chronic renal insufficiency), stage 3 (moderate) (HCC)   Malignant neoplasm of thoracic esophagus (HCC)   Hypothyroidism   AKI (acute kidney injury) (HCC) on CKD stage III AAA Baseline creatinine 1.4, admission creatinine 2.12.  Slowly improving with IV fluids. Cr today 1.78   Hypercalcemia Admission calcium 12.2, slowly improving.  Creatinine today morning 11.1 In the setting of widespread metastatic cancer.  IV fluids and bisphosphonate therapy.  Vitamin D is normal   Generalized weakness - CT of the head negative.  Elevated TSH, normal free T4.     Malignant neoplasm of prostate [facility]) Now with metastatic disease.  Previously patient has had known squamous cell carcinoma of esophagus in 2011 and thereafter also prostate cancer in June 2022. Will defer cancer treatment to oncology team, Dr. Truett Perna is following this patient. MRI of spine shows multilevel degenerative disease and also abnormal signaling consistent with likely metastatic disease.   Hypothyroidism Continue Synthroid   Malignant neoplasm of thoracic esophagus (HCC) "Remote" history of.  Despite Stage 4 and inoperable, apparently was successfully treated back in 2011 into long term remission  with chemo + radiation from what I can tell?   PT/OT-home health Nutrition consult     DVT prophylaxis: enoxaparin (LOVENOX) injection 30 mg Start: 11/13/22 1000 Code Status: Full Family Communication:  Spouse Updated Cont hosp stay for multiple issues             Diet Orders (From admission, onward)     Start     Ordered   11/13/22 1228  Diet regular Room service appropriate? No; Fluid consistency: Thin  Diet effective now       Question Answer Comment  Room service appropriate? No   Fluid consistency: Thin      11/13/22 1227            Subjective: Patient seen and examined at bedside, does not have any complaints.  Tolerating orals without any issues.  Overall weak but improving   Examination:  General exam: Appears calm and comfortable  Respiratory system: Clear to auscultation. Respiratory effort normal. Cardiovascular system: S1 & S2 heard, RRR. No JVD, murmurs, rubs, gallops or clicks. No pedal edema. Gastrointestinal system: Abdomen is nondistended, soft and nontender. No organomegaly or masses felt. Normal bowel sounds heard. Central nervous system: Alert and oriented. No focal neurological deficits. Extremities: Symmetric 5 x 5 power. Skin: No rashes, lesions or ulcers Psychiatry: Judgement and insight appear normal. Mood & affect appropriate.  Objective: Vitals:   11/13/22 0457 11/13/22 1236 11/13/22 2035 11/14/22 0446  BP: 122/70 (!) 98/54 (!) 100/52 108/70  Pulse: (!) 104 91 95 94  Resp: 19 16 19 18   Temp: 99.6 F (37.6 C) 98 F (36.7 C) 99.9 F (37.7 C) 100.2 F (37.9 C)  TempSrc: Oral Oral  Oral Oral  SpO2: 95% 98% 94% 97%  Weight:      Height:        Intake/Output Summary (Last 24 hours) at 11/14/2022 1202 Last data filed at 11/13/2022 1856 Gross per 24 hour  Intake 643.98 ml  Output 500 ml  Net 143.98 ml   Filed Weights   11/12/22 1855 11/12/22 2346  Weight: 81 kg 80 kg    Scheduled Meds:  aspirin EC  81 mg Oral Daily    enoxaparin (LOVENOX) injection  40 mg Subcutaneous Q24H   feeding supplement  237 mL Oral BID BM   latanoprost  1 drop Left Eye QHS   levothyroxine  150 mcg Oral Q0600   multivitamin with minerals  1 tablet Oral Daily   nystatin  5 mL Oral QID   potassium chloride  40 mEq Oral Q4H   relugolix  240 mg Oral Daily   simvastatin  10 mg Oral Daily   Continuous Infusions:  sodium chloride 75 mL/hr at 11/14/22 9528    Nutritional status Signs/Symptoms: mild fat depletion, mild muscle depletion, percent weight loss Interventions: Ensure Enlive (each supplement provides 350kcal and 20 grams of protein), MVI, Magic cup, Liberalize Diet Body mass index is 22.04 kg/m.  Data Reviewed:   CBC: Recent Labs  Lab 11/12/22 1825 11/14/22 0501  WBC 5.1 4.6  NEUTROABS 3.8  --   HGB 10.4* 8.9*  HCT 31.5* 27.3*  MCV 93.8 93.2  PLT 120* 119*   Basic Metabolic Panel: Recent Labs  Lab 11/12/22 1825 11/13/22 0217 11/14/22 0501  NA 128* 129* 129*  K 3.9 3.4* 3.3*  CL 92* 93* 93*  CO2 24 25 24   GLUCOSE 107* 96 103*  BUN 30* 27* 24*  CREATININE 2.12* 1.93* 1.78*  CALCIUM 12.2* 11.8* 11.1*  MG  --   --  2.0  PHOS  --   --  2.7   GFR: Estimated Creatinine Clearance: 35 mL/min (A) (by C-G formula based on SCr of 1.78 mg/dL (H)). Liver Function Tests: Recent Labs  Lab 11/12/22 1825 11/13/22 0217 11/14/22 0501  AST 39 29 23  ALT 12 11 10   ALKPHOS 114 99 83  BILITOT 0.5 0.8 0.4  PROT 8.3* 7.6 7.4  ALBUMIN 3.8 3.4* 3.2*   Recent Labs  Lab 11/12/22 1825  LIPASE 26   Recent Labs  Lab 11/13/22 1040  AMMONIA 20   Coagulation Profile: No results for input(s): "INR", "PROTIME" in the last 168 hours. Cardiac Enzymes: No results for input(s): "CKTOTAL", "CKMB", "CKMBINDEX", "TROPONINI" in the last 168 hours. BNP (last 3 results) No results for input(s): "PROBNP" in the last 8760 hours. HbA1C: No results for input(s): "HGBA1C" in the last 72 hours. CBG: No results for input(s):  "GLUCAP" in the last 168 hours. Lipid Profile: No results for input(s): "CHOL", "HDL", "LDLCALC", "TRIG", "CHOLHDL", "LDLDIRECT" in the last 72 hours. Thyroid Function Tests: Recent Labs    11/13/22 0217  TSH 29.833*  FREET4 0.88   Anemia Panel: Recent Labs    11/13/22 1040 11/14/22 0501  VITAMINB12 230  --   FOLATE  --  8.4   Sepsis Labs: No results for input(s): "PROCALCITON", "LATICACIDVEN" in the last 168 hours.  Recent Results (from the past 240 hour(s))  Urine Culture     Status: None   Collection Time: 11/09/22  3:24 PM   Specimen: Urine, Clean Catch  Result Value Ref Range Status   Specimen Description   Final    URINE, CLEAN CATCH  Performed at [facility], [address] [place], [place] [address]    Special Requests   Final    NONE Performed at [facility], [address], [place], [place] [address]    Culture   Final    NO GROWTH Performed at [facility], [address] [place], [place] [address]    Report Status 11/10/2022 FINAL  Final  Resp panel by RT-PCR (RSV, Flu A&B, Covid) Anterior Nasal Swab     Status: None   Collection Time: 11/12/22  6:25 PM   Specimen: Anterior Nasal Swab  Result Value Ref Range Status   SARS Coronavirus 2 by RT PCR NEGATIVE NEGATIVE Final    Comment: (NOTE) SARS-CoV-2 target nucleic acids are NOT DETECTED.  The SARS-CoV-2 RNA is generally detectable in upper respiratory specimens during the acute phase of infection. The lowest concentration of SARS-CoV-2 viral copies this assay can detect is 138 copies/mL. A negative result does not preclude SARS-Cov-2 infection and should not be used as the sole basis for treatment or other patient management decisions. A negative result may occur with  improper specimen collection/handling, submission of specimen other than nasopharyngeal swab, presence of viral mutation(s) within the areas targeted by this assay, and inadequate  number of viral copies(<138 copies/mL). A negative result must be combined with clinical observations, patient history, and epidemiological information. The expected result is Negative.  Fact Sheet for Patients:  BloggerCourse.com  Fact Sheet for Healthcare Providers:  SeriousBroker.it  This test is no t yet approved or cleared by the [place] FDA and  has been authorized for detection and/or diagnosis of SARS-CoV-2 by FDA under an Emergency Use Authorization (EUA). This EUA will remain  in effect (meaning this test can be used) for the duration of the COVID-19 declaration under Section 564(b)(1) of the Act, 21 U.S.C.section 360bbb-3(b)(1), unless the authorization is terminated  or revoked sooner.       Influenza A by PCR NEGATIVE NEGATIVE Final   Influenza B by PCR NEGATIVE NEGATIVE Final    Comment: (NOTE) The Xpert Xpress SARS-CoV-2/FLU/RSV plus assay is intended as an aid in the diagnosis of influenza from Nasopharyngeal swab specimens and should not be used as a sole basis for treatment. Nasal washings and aspirates are unacceptable for Xpert Xpress SARS-CoV-2/FLU/RSV testing.  Fact Sheet for Patients: BloggerCourse.com  Fact Sheet for Healthcare Providers: SeriousBroker.it  This test is not yet approved or cleared by the [place] FDA and has been authorized for detection and/or diagnosis of SARS-CoV-2 by FDA under an Emergency Use Authorization (EUA). This EUA will remain in effect (meaning this test can be used) for the duration of the COVID-19 declaration under Section 564(b)(1) of the Act, 21 U.S.C. section 360bbb-3(b)(1), unless the authorization is terminated or revoked.     Resp Syncytial Virus by PCR NEGATIVE NEGATIVE Final    Comment: (NOTE) Fact Sheet for Patients: BloggerCourse.com  Fact Sheet for Healthcare  Providers: SeriousBroker.it  This test is not yet approved or cleared by the [place] FDA and has been authorized for detection and/or diagnosis of SARS-CoV-2 by FDA under an Emergency Use Authorization (EUA). This EUA will remain in effect (meaning this test can be used) for the duration of the COVID-19 declaration under Section 564(b)(1) of the Act, 21 U.S.C. section 360bbb-3(b)(1), unless the authorization is terminated or revoked.  Performed at [facility], [address], [place], [place] [address]  Radiology Studies: MR TOTAL SPINE METS SCREENING  Result Date: 11/13/2022 CLINICAL DATA:  Prostate cancer, metastatic to bone EXAM: MRI TOTAL SPINE WITHOUT AND WITH CONTRAST TECHNIQUE: Multisequence MR imaging of the spine from the cervical spine to the sacrum was performed prior to and following IV contrast administration for evaluation of spinal metastatic disease. CONTRAST:  8mL GADAVIST GADOBUTROL 1 MMOL/ML IV SOLN COMPARISON:  No prior MRI of the spine available, correlation is made with 11/12/2022 CT lumbar spine and 10/18/2022 PET-CT FINDINGS: MRI CERVICAL SPINE FINDINGS Alignment: No significant listhesis. Preservation of the normal cervical lordosis. Vertebrae: Diffusely abnormal marrow signal throughout the cervical spine, which correlates with the diffuse metabolically active lesions seen on the recent PET-CT. Vertebral body heights appear preserved. The axial sequences are somewhat motion limited, however there does not appear to be cord compression secondary to epidural extension of tumor. Cord: Normal signal and morphology.  No abnormal enhancement. Posterior Fossa, vertebral arteries, paraspinal tissues: Grossly normal. Disc levels: C3-C4: Moderate disc bulge. Facet and uncovertebral hypertrophy. Ligamentum flavum hypertrophy. Moderate to severe spinal canal stenosis. Severe right and moderate to severe left neural  foraminal narrowing. C4-C5: Moderate disc bulge. Facet and uncovertebral hypertrophy. Mild-to-moderate spinal canal stenosis. Severe right and moderate to severe left neural foraminal narrowing. C5-C6: Moderate disc bulge. Facet and uncovertebral hypertrophy. Ligamentum flavum hypertrophy. Moderate to severe spinal canal stenosis. Moderate right and mild left neural foraminal narrowing. Milder degenerative changes at other cervical levels. MRI THORACIC SPINE FINDINGS Alignment: No listhesis. Mild S-shaped curvature of the thoracolumbar spine. Vertebrae: Diffusely abnormal marrow signal throughout the thoracic spine. No definite acute fracture. No epidural extension of tumor. Cord:  Normal spinal cord signal.  No abnormal enhancement. Paraspinal and other soft tissues: No acute finding. Disc levels: No significant spinal canal stenosis. MRI LUMBAR SPINE FINDINGS Segmentation: 5 lumbar type vertebral bodies. Lumbarization of S1. The last fully formed disc space is labeled S1-S2. Alignment: Trace anterolisthesis of L5 on S1. S-shaped curvature of the thoracolumbar spine. Vertebrae: Diffusely abnormal marrow signal. No evidence of acute fracture or epidural extension of tumor. Conus medullaris: Extends to the L1 level and appears normal. No abnormal enhancement. Paraspinal and other soft tissues: No definite acute finding given only sagittal images. Redemonstrated bladder diverticulum. Disc levels: No high-grade spinal canal stenosis. IMPRESSION: 1. Diffusely abnormal marrow signal throughout the spine, which correlates with the diffuse metabolically active lesions seen on the 10/18/2022 PET-CT. 2. No evidence of epidural extension of tumor or pathologic cord compression. 3. Degenerative changes result in moderate to severe spinal canal stenosis at C3-C4 and C5-C6. 4. Multilevel neural foraminal narrowing, which is severe on the right at C3-C4 and C4-C5 and moderate to severe on the left at C3-C4 and C4-C5. 5. No  high-grade spinal canal stenosis in the thoracic or lumbar spine. Electronically Signed   By: Wiliam Ke M.D.   On: 11/13/2022 00:36   CT Lumbar Spine Wo Contrast  Result Date: 11/12/2022 CLINICAL DATA:  Fall, back pain EXAM: CT LUMBAR SPINE WITHOUT CONTRAST TECHNIQUE: Multidetector CT imaging of the lumbar spine was performed without intravenous contrast administration. Multiplanar CT image reconstructions were also generated. RADIATION DOSE REDUCTION: This exam was performed according to the departmental dose-optimization program which includes automated exposure control, adjustment of the mA and/or kV according to patient size and/or use of iterative reconstruction technique. COMPARISON:  Plain films today. FINDINGS: Segmentation: 5 lumbar type vertebrae. Alignment: 4 mm anterolisthesis of L4 on L5 related to degenerative facet disease. Vertebrae: Slight compression  fracture the superior endplate of T12. This is age indeterminate. No additional fracture. Diffuse osteopenia. Paraspinal and other soft tissues: Paraspinal soft tissues negative. Large posterior bladder wall diverticulum on the right. Aortic atherosclerosis without aneurysm. Bilateral renal cysts. Disc levels: Advanced degenerative facet disease, most pronounced from L3-4 through L5-S1. no disc herniation. IMPRESSION: Slight compression fracture through the anterior superior endplate of T12, age indeterminate. Degenerative changes, most pronounced in the facets from L3-4 through L5-S1. Electronically Signed   By: Charlett Nose M.D.   On: 11/12/2022 19:21   DG Lumbar Spine 2-3 Views  Result Date: 11/12/2022 CLINICAL DATA:  Fall EXAM: LUMBAR SPINE - 2-3 VIEW COMPARISON:  None Available. FINDINGS: There is 4 mm of anterolisthesis at L4-L5 which is favored as degenerative. Alignment is otherwise anatomic. There is no acute fracture. There significant degenerative changes of facet joints throughout the lumbar spine. There are atherosclerotic  calcifications of the aorta. IMPRESSION: 1. No acute fracture. 2. Grade 1 anterolisthesis at L4-L5 is favored as degenerative. Electronically Signed   By: Darliss Cheney M.D.   On: 11/12/2022 19:09   DG Pelvis 1-2 Views  Result Date: 11/12/2022 CLINICAL DATA:  Fall EXAM: PELVIS - 1-2 VIEW COMPARISON:  None Available. FINDINGS: Hip joints and SI joints symmetric. No acute bony abnormality. Specifically, no fracture, subluxation, or dislocation. IMPRESSION: No acute bony abnormality. Electronically Signed   By: Charlett Nose M.D.   On: 11/12/2022 19:07   CT Head Wo Contrast  Result Date: 11/12/2022 CLINICAL DATA:  Mental status changes.  Fall. EXAM: CT HEAD WITHOUT CONTRAST TECHNIQUE: Contiguous axial images were obtained from the base of the skull through the vertex without intravenous contrast. RADIATION DOSE REDUCTION: This exam was performed according to the departmental dose-optimization program which includes automated exposure control, adjustment of the mA and/or kV according to patient size and/or use of iterative reconstruction technique. COMPARISON:  None Available. FINDINGS: Brain: There is atrophy and chronic small vessel disease changes. No acute intracranial abnormality. Specifically, no hemorrhage, hydrocephalus, mass lesion, acute infarction, or significant intracranial injury. Vascular: No hyperdense vessel or unexpected calcification. Skull: No acute calvarial abnormality. Sinuses/Orbits: No acute findings Other: None IMPRESSION: Atrophy, chronic microvascular disease. No acute intracranial abnormality. Electronically Signed   By: Charlett Nose M.D.   On: 11/12/2022 19:03   DG Chest Port 1 View  Result Date: 11/12/2022 CLINICAL DATA:  Fall, weakness.  History of COPD, former smoker. EXAM: PORTABLE CHEST 1 VIEW COMPARISON:  04/20/2020 FINDINGS: The heart size and mediastinal contours are within normal limits. Both lungs are clear. The visualized skeletal structures are unremarkable.  IMPRESSION: No active disease. Electronically Signed   By: Charlett Nose M.D.   On: 11/12/2022 19:03           LOS: 1 day   Time spent= 35 mins    Miguel Rota, MD [facility]  If 7PM-7AM, please contact night-coverage  11/14/2022, 12:02 PM

## 2022-11-14 NOTE — Progress Notes (Signed)
 [facility] Liaison Note  Notified by [facility] manager of patient/family request for [facility] palliative services at home after discharge.   [facility] hospital liaison will follow patient for discharge disposition.   Please call with any hospice or outpatient palliative care related questions.   Thank you for the opportunity to participate in this patient's care.   Dionicio Stall, [facility] Liaison [phone]

## 2022-11-14 NOTE — TOC Progression Note (Addendum)
Transition of Care [facility]) - Progression Note    Patient Details  Name: Aaron [name] MRN: [id number] Date of Birth: 06/29/1935  [facility] CM/SW Contact  Howell Rucks, RN Phone Number: 11/14/2022, 10:17 AM  Clinical Narrative: PT eval completed, recommendation for [facility] PT. Call to pt's spouse Steward Drone) to discuss if agreeable, non answer, vm left requesting call back, will also discuss choice for w/c or walker, and inform shower chair not covered by insurance, will be out of pocket expense, await call back. Palliative consult pending.   -2:35pm Met with pt's spouse at bedside for dc planning , PT recommendation for [facility] PT, pt's spouse agreeable, no preference. Reviewed home DME, pt's spouse reports she would like the hospital bed, wheelchair and DME, reports she will purchase shower chair. NCM will assist with HH PT. [employer] rep-Jermaine -hospital bed, wheelchair and BSC. NCM provided spouses' mobile number to arrange delivery of DME. TOC will continue to follow.    2:45pm Palliative Medicine Team consult completed, per teams chat " she (spouse) wants to bring him home towards the end of this hospitalization, home with home PT and home based palliative. [facility] rep-Shanita, accepted for home Palliative care.       Barriers to Discharge: Continued Medical Work up  Expected Discharge Plan and Services   Discharge Planning Services: CM Consult   Living arrangements for the past 2 months: Single Family Home                                       Social Determinants of Health (SDOH) Interventions SDOH Screenings   Food Insecurity: No Food Insecurity (11/12/2022)  Housing: Low Risk  (11/12/2022)  Transportation Needs: No Transportation Needs (11/12/2022)  Utilities: Not At Risk (11/12/2022)  Alcohol Screen: Low Risk  (09/10/2022)  Depression (PHQ2-9): High Risk (11/09/2022)  Financial Resource Strain: Low Risk  (09/10/2022)  Physical Activity: Inactive (09/10/2022)   Social Connections: Socially Integrated (09/10/2022)  Stress: No Stress Concern Present (09/10/2022)  Tobacco Use: Medium Risk (11/12/2022)    Readmission Risk Interventions     No data to display

## 2022-11-14 NOTE — Hospital Course (Addendum)
  Brief Narrative:  87 year old with history of esophageal cancer treated in 2011 with chemoradiation, inoperable but apparently long-term remission, prostate cancer June 2022 on radiation/hormonal therapy which has recently progressed and became widely metastatic as of October 18, 2022.  Seen by oncology on the day of admission patient has become significantly weaker therefore comes to the hospital. Admitted for hyperCal, started on IVF, and Bisphos therapy.  During the hospitalization with hydration his renal function improved, calcium trended down and normalized.  Eventually family discussed case with oncology and he was transition to go home with hospice services.  Arrangements were made by [facility] team. His CODE STATUS has also been changed to DNR with his wishes.      Assessment & Plan:  Principal Problem:   AKI (acute kidney injury) (HCC) Active Problems:   Malignant neoplasm of prostate (HCC)   Hypercalcemia   CRI (chronic renal insufficiency), stage 3 (moderate) (HCC)   Malignant neoplasm of thoracic esophagus (HCC)   Hypothyroidism   AKI (acute kidney injury) (HCC) on CKD stage III AAA Baseline creatinine 1.4, admission creatinine 2.12.  Slowly improving with IV fluids. Cr today 1.47   Hypercalcemia Admission calcium 12.2, slowly improving.  Creatinine today morning 10.4.  Advised oral hydration In the setting of widespread metastatic cancer.  IV fluids and bisphosphonate therapy.  Vitamin D is normal   Generalized weakness - CT of the head negative.  Elevated TSH, normal free T4.     Malignant neoplasm of prostate [facility]) Now with metastatic disease.  Previously patient has had known squamous cell carcinoma of esophagus in 2011 and thereafter also prostate cancer in June 2022. Will defer cancer treatment to oncology team, Dr. Truett Perna is following this patient. MRI of spine shows multilevel degenerative disease and also abnormal signaling consistent with likely metastatic disease.    Hypothyroidism Continue Synthroid   Malignant neoplasm of thoracic esophagus (HCC) "Remote" history of.  Despite Stage 4 and inoperable, apparently was successfully treated back in 2011 into long term remission with chemo + radiation from what I can tell?   PT/OT-home health, f16f completed.  Nutrition consult Hospice consulted. Family interested.      DVT prophylaxis: enoxaparin (LOVENOX) injection 30 mg Start: 11/13/22 1000 Transition patient home.

## 2022-11-15 ENCOUNTER — Other Ambulatory Visit (HOSPITAL_COMMUNITY): Payer: Self-pay

## 2022-11-15 DIAGNOSIS — N179 Acute kidney failure, unspecified: Secondary | ICD-10-CM | POA: Diagnosis not present

## 2022-11-15 LAB — CBC
HCT: 27.1 % — ABNORMAL LOW (ref 39.0–52.0)
Hemoglobin: 8.8 g/dL — ABNORMAL LOW (ref 13.0–17.0)
MCH: 30.1 pg (ref 26.0–34.0)
MCHC: 32.5 g/dL (ref 30.0–36.0)
MCV: 92.8 fL (ref 80.0–100.0)
Platelets: 125 10*3/uL — ABNORMAL LOW (ref 150–400)
RBC: 2.92 MIL/uL — ABNORMAL LOW (ref 4.22–5.81)
RDW: 14.1 % (ref 11.5–15.5)
WBC: 5.8 10*3/uL (ref 4.0–10.5)
nRBC: 0.3 % — ABNORMAL HIGH (ref 0.0–0.2)

## 2022-11-15 LAB — COMPREHENSIVE METABOLIC PANEL
ALT: 10 U/L (ref 0–44)
AST: 21 U/L (ref 15–41)
Albumin: 3.1 g/dL — ABNORMAL LOW (ref 3.5–5.0)
Alkaline Phosphatase: 78 U/L (ref 38–126)
Anion gap: 9 (ref 5–15)
BUN: 26 mg/dL — ABNORMAL HIGH (ref 8–23)
CO2: 25 mmol/L (ref 22–32)
Calcium: 10.4 mg/dL — ABNORMAL HIGH (ref 8.9–10.3)
Chloride: 96 mmol/L — ABNORMAL LOW (ref 98–111)
Creatinine, Ser: 1.47 mg/dL — ABNORMAL HIGH (ref 0.61–1.24)
GFR, Estimated: 47 mL/min — ABNORMAL LOW (ref >60)
Glucose, Bld: 163 mg/dL — ABNORMAL HIGH (ref 70–99)
Potassium: 4.2 mmol/L (ref 3.5–5.1)
Sodium: 130 mmol/L — ABNORMAL LOW (ref 135–145)
Total Bilirubin: 0.3 mg/dL (ref 0.3–1.2)
Total Protein: 7.5 g/dL (ref 6.5–8.1)

## 2022-11-15 LAB — MAGNESIUM: Magnesium: 1.9 mg/dL (ref 1.7–2.4)

## 2022-11-15 MED ORDER — NYSTATIN 100000 UNIT/ML MT SUSP
5.0000 mL | Freq: Four times a day (QID) | OROMUCOSAL | 0 refills | Status: DC
  Filled 2022-11-15: qty 60, 7d supply, fill #0

## 2022-11-15 NOTE — Progress Notes (Signed)
PROGRESS NOTE    Aaron [name]  OZH:[id number] DOB: 1935/11/16 DOA: 11/12/2022 PCP: Tresa Garter, MD     Brief Narrative:  87 year old with history of esophageal cancer treated in 2011 with chemoradiation, inoperable but apparently long-term remission, prostate cancer June 2022 on radiation/hormonal therapy which has recently progressed and became widely metastatic as of October 18, 2022.  Seen by oncology on the day of admission patient has become significantly weaker therefore comes to the hospital. Admitted for hyperCal, started on IVF, and Bisphos therapy.      Assessment & Plan:  Principal Problem:   AKI (acute kidney injury) (HCC) Active Problems:   Malignant neoplasm of prostate (HCC)   Hypercalcemia   CRI (chronic renal insufficiency), stage 3 (moderate) (HCC)   Malignant neoplasm of thoracic esophagus (HCC)   Hypothyroidism   AKI (acute kidney injury) (HCC) on CKD stage III AAA Baseline creatinine 1.4, admission creatinine 2.12.  Slowly improving with IV fluids. Cr today 1.47   Hypercalcemia Admission calcium 12.2, slowly improving.  Creatinine today morning 10.4.  Advised oral hydration In the setting of widespread metastatic cancer.  IV fluids and bisphosphonate therapy.  Vitamin D is normal   Generalized weakness - CT of the head negative.  Elevated TSH, normal free T4.     Malignant neoplasm of prostate [facility]) Now with metastatic disease.  Previously patient has had known squamous cell carcinoma of esophagus in 2011 and thereafter also prostate cancer in June 2022. Will defer cancer treatment to oncology team, Dr. Truett Perna is following this patient. MRI of spine shows multilevel degenerative disease and also abnormal signaling consistent with likely metastatic disease.   Hypothyroidism Continue Synthroid   Malignant neoplasm of thoracic esophagus (HCC) "Remote" history of.  Despite Stage 4 and inoperable, apparently was successfully treated back in 2011  into long term remission with chemo + radiation from what I can tell?   PT/OT-home health, f22f completed.  Nutrition consult Hospice consulted. Family interested.      DVT prophylaxis: enoxaparin (LOVENOX) injection 30 mg Start: 11/13/22 1000 Code Status: Full Family Communication:  Spouse Updated Cont hosp stay for multiple issues             Diet Orders (From admission, onward)     Start     Ordered   11/13/22 1228  Diet regular Room service appropriate? No; Fluid consistency: Thin  Diet effective now       Question Answer Comment  Room service appropriate? No   Fluid consistency: Thin      11/13/22 1227            Subjective:  No complaints. Generally weak.   Examination:  General exam: Appears calm and comfortable ; frail Respiratory system: Clear to auscultation. Respiratory effort normal. Cardiovascular system: S1 & S2 heard, RRR. No JVD, murmurs, rubs, gallops or clicks. No pedal edema. Gastrointestinal system: Abdomen is nondistended, soft and nontender. No organomegaly or masses felt. Normal bowel sounds heard. Central nervous system: Alert and oriented. No focal neurological deficits. Extremities: Symmetric 5 x 5 power. Skin: No rashes, lesions or ulcers Psychiatry: Judgement and insight appear normal. Mood & affect appropriate.  Objective: Vitals:   11/14/22 0446 11/14/22 1313 11/14/22 1945 11/15/22 0411  BP: 108/70 104/66 111/68 123/78  Pulse: 94 93 (!) 106 (!) 104  Resp: 18 17 17 19   Temp: 100.2 F (37.9 C) (!) 97.5 F (36.4 C) 98.7 F (37.1 C) 97.8 F (36.6 C)  TempSrc: Oral Oral Oral Oral  SpO2:  97% 97% 98% 97%  Weight:      Height:        Intake/Output Summary (Last 24 hours) at 11/15/2022 1426 Last data filed at 11/15/2022 0413 Gross per 24 hour  Intake --  Output 1050 ml  Net -1050 ml   Filed Weights   11/12/22 1855 11/12/22 2346  Weight: 81 kg 80 kg    Scheduled Meds:  aspirin EC  81 mg Oral Daily   enoxaparin  (LOVENOX) injection  40 mg Subcutaneous Q24H   feeding supplement  237 mL Oral BID BM   latanoprost  1 drop Left Eye QHS   levothyroxine  150 mcg Oral Q0600   multivitamin with minerals  1 tablet Oral Daily   nystatin  5 mL Oral QID   relugolix  240 mg Oral Daily   simvastatin  10 mg Oral Daily   Continuous Infusions:  Nutritional status Signs/Symptoms: mild fat depletion, mild muscle depletion, percent weight loss Interventions: Ensure Enlive (each supplement provides 350kcal and 20 grams of protein), MVI, Magic cup, Liberalize Diet Body mass index is 22.04 kg/m.  Data Reviewed:   CBC: Recent Labs  Lab 11/12/22 1825 11/14/22 0501 11/15/22 0450  WBC 5.1 4.6 5.8  NEUTROABS 3.8  --   --   HGB 10.4* 8.9* 8.8*  HCT 31.5* 27.3* 27.1*  MCV 93.8 93.2 92.8  PLT 120* 119* 125*   Basic Metabolic Panel: Recent Labs  Lab 11/12/22 1825 11/13/22 0217 11/14/22 0501 11/15/22 0450  NA 128* 129* 129* 130*  K 3.9 3.4* 3.3* 4.2  CL 92* 93* 93* 96*  CO2 24 25 24 25   GLUCOSE 107* 96 103* 163*  BUN 30* 27* 24* 26*  CREATININE 2.12* 1.93* 1.78* 1.47*  CALCIUM 12.2* 11.8* 11.1* 10.4*  MG  --   --  2.0 1.9  PHOS  --   --  2.7  --    GFR: Estimated Creatinine Clearance: 42.3 mL/min (A) (by C-G formula based on SCr of 1.47 mg/dL (H)). Liver Function Tests: Recent Labs  Lab 11/12/22 1825 11/13/22 0217 11/14/22 0501 11/15/22 0450  AST 39 29 23 21   ALT 12 11 10 10   ALKPHOS 114 99 83 78  BILITOT 0.5 0.8 0.4 0.3  PROT 8.3* 7.6 7.4 7.5  ALBUMIN 3.8 3.4* 3.2* 3.1*   Recent Labs  Lab 11/12/22 1825  LIPASE 26   Recent Labs  Lab 11/13/22 1040  AMMONIA 20   Coagulation Profile: No results for input(s): "INR", "PROTIME" in the last 168 hours. Cardiac Enzymes: No results for input(s): "CKTOTAL", "CKMB", "CKMBINDEX", "TROPONINI" in the last 168 hours. BNP (last 3 results) No results for input(s): "PROBNP" in the last 8760 hours. HbA1C: No results for input(s): "HGBA1C" in the  last 72 hours. CBG: No results for input(s): "GLUCAP" in the last 168 hours. Lipid Profile: No results for input(s): "CHOL", "HDL", "LDLCALC", "TRIG", "CHOLHDL", "LDLDIRECT" in the last 72 hours. Thyroid Function Tests: Recent Labs    11/13/22 0217  TSH 29.833*  FREET4 0.88   Anemia Panel: Recent Labs    11/13/22 1040 11/14/22 0501  VITAMINB12 230  --   FOLATE  --  8.4   Sepsis Labs: No results for input(s): "PROCALCITON", "LATICACIDVEN" in the last 168 hours.  Recent Results (from the past 240 hour(s))  Urine Culture     Status: None   Collection Time: 11/09/22  3:24 PM   Specimen: Urine, Clean Catch  Result Value Ref Range Status   Specimen Description  Final    URINE, CLEAN CATCH Performed at [facility], [address], [place], [place] [address]    Special Requests   Final    NONE Performed at [facility], [address], [place], [place] [address]    Culture   Final    NO GROWTH Performed at [facility], [address] [place], [place] [address]    Report Status 11/10/2022 FINAL  Final  Resp panel by RT-PCR (RSV, Flu A&B, Covid) Anterior Nasal Swab     Status: None   Collection Time: 11/12/22  6:25 PM   Specimen: Anterior Nasal Swab  Result Value Ref Range Status   SARS Coronavirus 2 by RT PCR NEGATIVE NEGATIVE Final    Comment: (NOTE) SARS-CoV-2 target nucleic acids are NOT DETECTED.  The SARS-CoV-2 RNA is generally detectable in upper respiratory specimens during the acute phase of infection. The lowest concentration of SARS-CoV-2 viral copies this assay can detect is 138 copies/mL. A negative result does not preclude SARS-Cov-2 infection and should not be used as the sole basis for treatment or other patient management decisions. A negative result may occur with  improper specimen collection/handling, submission of specimen other than nasopharyngeal swab, presence of viral mutation(s) within  the areas targeted by this assay, and inadequate number of viral copies(<138 copies/mL). A negative result must be combined with clinical observations, patient history, and epidemiological information. The expected result is Negative.  Fact Sheet for Patients:  BloggerCourse.com  Fact Sheet for Healthcare Providers:  SeriousBroker.it  This test is no t yet approved or cleared by the [place] FDA and  has been authorized for detection and/or diagnosis of SARS-CoV-2 by FDA under an Emergency Use Authorization (EUA). This EUA will remain  in effect (meaning this test can be used) for the duration of the COVID-19 declaration under Section 564(b)(1) of the Act, 21 U.S.C.section 360bbb-3(b)(1), unless the authorization is terminated  or revoked sooner.       Influenza A by PCR NEGATIVE NEGATIVE Final   Influenza B by PCR NEGATIVE NEGATIVE Final    Comment: (NOTE) The Xpert Xpress SARS-CoV-2/FLU/RSV plus assay is intended as an aid in the diagnosis of influenza from Nasopharyngeal swab specimens and should not be used as a sole basis for treatment. Nasal washings and aspirates are unacceptable for Xpert Xpress SARS-CoV-2/FLU/RSV testing.  Fact Sheet for Patients: BloggerCourse.com  Fact Sheet for Healthcare Providers: SeriousBroker.it  This test is not yet approved or cleared by the [place] FDA and has been authorized for detection and/or diagnosis of SARS-CoV-2 by FDA under an Emergency Use Authorization (EUA). This EUA will remain in effect (meaning this test can be used) for the duration of the COVID-19 declaration under Section 564(b)(1) of the Act, 21 U.S.C. section 360bbb-3(b)(1), unless the authorization is terminated or revoked.     Resp Syncytial Virus by PCR NEGATIVE NEGATIVE Final    Comment: (NOTE) Fact Sheet for  Patients: BloggerCourse.com  Fact Sheet for Healthcare Providers: SeriousBroker.it  This test is not yet approved or cleared by the [place] FDA and has been authorized for detection and/or diagnosis of SARS-CoV-2 by FDA under an Emergency Use Authorization (EUA). This EUA will remain in effect (meaning this test can be used) for the duration of the COVID-19 declaration under Section 564(b)(1) of the Act, 21 U.S.C. section 360bbb-3(b)(1), unless the authorization is terminated or revoked.  Performed at [facility], [address], [place], [place] [address]  Radiology Studies: No results found.         LOS: 2 days   Time spent= 35 mins    Miguel Rota, MD [facility]  If 7PM-7AM, please contact night-coverage  11/15/2022, 2:26 PM

## 2022-11-15 NOTE — Progress Notes (Signed)
IP PROGRESS NOTE  Subjective:   Mr. Gerhardt appears unchanged.  He denies pain.  No complaint.   Objective: Vital signs in last 24 hours: Blood pressure 123/78, pulse (!) 104, temperature 97.8 F (36.6 C), temperature source Oral, resp. rate 19, height 6\' 3"  (1.905 m), weight 176 lb 5.9 oz (80 kg), SpO2 97%.  Intake/Output from previous day: 08/21 0701 - 08/22 0700 In: -  Out: 1050 [Urine:1050]  Physical Exam:  HEENT: Mild white coat over the tongue, no buccal thrush. Abdomen: Nontender, no hepatosplenomegaly Extremities: No leg edema Neurologic: Alert, not oriented to place or diagnosis.  Moves all extremities to command. Lab Results: Recent Labs    11/14/22 0501 11/15/22 0450  WBC 4.6 5.8  HGB 8.9* 8.8*  HCT 27.3* 27.1*  PLT 119* 125*    BMET Recent Labs    11/14/22 0501 11/15/22 0450  NA 129* 130*  K 3.3* 4.2  CL 93* 96*  CO2 24 25  GLUCOSE 103* 163*  BUN 24* 26*  CREATININE 1.78* 1.47*  CALCIUM 11.1* 10.4*    No results found for: "CEA1", "CEA", "RUE454", "CA125"  Studies/Results: No results found.  Medications: I have reviewed the patient's current medications.  Assessment/Plan: Squamous cell carcinoma of the esophagus, status post an endoscopic biopsy, 04/07/2009, with the pathology confirming at least superficial invasion. PET scan, 04/20/2009, confirmed hypermetabolic activity associated with the upper and thoracic esophagus mass with a single hypermetabolic right paratracheal lymph node. He began concurrent radiation and weekly Taxol/carboplatin chemotherapy, 05/03/2009. The last treatment with Taxol/carboplatin was given 06/14/2009. He completed radiation on 06/16/2009.   Solid/liquid dysphagia secondary to the proximal esophageal mass, resolved. He is status post an esophageal dilatation for management of a stricture, 08/24/2009, repeat esophageal dilatation 10/03/2011. Borderline enlarged mediastinal and gastrohepatic lymph nodes on staging CT  scans. PET scan 04/20/2009 confirmed hypermetabolic activity associated with the right paratracheal lymph node. Hypertension. Status post placement of a feeding jejunostomy tube by Dr. Michaell Cowing. The jejunostomy tube has been removed. Admission with neutropenia and anemia, March 2011.   Fever of unknown origin, April 2011, resolved. He was treated with prednisone beginning on 07/13/2009 for presumed radiation pneumonitis. He developed episodes of rigors on prednisone, and the prednisone was discontinued, 07/15/2009. A CT scan did not reveal evidence for an infectious process. Zoster rash at the right back and anterior chest July 2013. Questionable leukoplakia on exam 04/09/2013-he was evaluated by dental medicine Prostate cancer-Gleason 9 prostate cancer diagnosed in June 2022 Orgovyx August 2022, and apalutamide, September 2022 on the HERO trial 01/12/2021 - 03/14/2021 external beam radiation, 45 Gy in 25 fractions to prostate, seminal vesicles, and pelvic lymph nodes, prostate only boosted to 75 Gy with 15 additional fractions of 2 Gy nodes Rising PSA May 2024 09/11/2022 PSA 12.7, testosterone 16.9 PSMA PET 10/18/2022-significant decrease in hypermetabolism associated with the prostate, abdominopelvic lymph nodes, and seminal vesicles, development of diffuse osseous and probable thoracic lymph node metastases MRI total spine 11/13/2022-diffuse metastatic prostate cancer involving the spine, no evidence of epidural tumor extension or cord compression, spinal stenosis at C3-C4 and C5-C6 secondary to degenerative changes 12.  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  He presented with generalized weakness and altered mental status.  The altered mental status is likely related to delirium from metastatic prostate cancer and hypercalcemia.  He has a poor prognosis.  We had planned a trial of Pluvicto radiotherapy in an attempt to improve the  metastatic prostate  cancer and extend survival.  The hypercalcemia has improved, but he remains confused with a poor performance status.  He is not a candidate for further treatment of the prostate cancer in his current condition.    The anemia and thrombocytopenia are likely secondary to extensive metastatic prostate cancer involving the bone marrow.   I discussed the situation with his wife by telephone at approximately 1:15 PM today.  I explained the poor prognosis.  She would like to take Mr. Velasco home, but understands she will need help.  I recommend full hospice care in order for Mr. Olmsted to receive additional services in the home.  She agrees to a referral for home hospice care.  Recommendations: Continue intravenous hydration Check calcium level 11/16/2022 [facility] referral for home hospice I will be glad to serve as the primary provider with the home hospice team Continue Relugolix Nystatin for oral candidiasis Outpatient follow-up with oncology will be scheduled as needed.           LOS: 2 days   Thornton Papas, MD   11/15/2022, 1:09 PM

## 2022-11-15 NOTE — Plan of Care (Signed)

## 2022-11-15 NOTE — Plan of Care (Signed)
  Problem: Coping: Goal: Level of anxiety will decrease Outcome: Progressing   Problem: Safety: Goal: Ability to remain free from injury will improve Outcome: Progressing   Problem: Skin Integrity: Goal: Risk for impaired skin integrity will decrease Outcome: Progressing   

## 2022-11-15 NOTE — TOC Progression Note (Addendum)
Transition of Care [facility]) - Progression Note    Patient Details  Name: Aaron [name] MRN: [id number] Date of Birth: [birth year]  Transition of Care [facility]) CM/SW Contact  Howell Rucks, RN Phone Number: 11/15/2022, 12:49 PM  Clinical Narrative:  Pt's spouse Steward Drone) requested to speak with NCM at bedside. NCM met with spouse at bedside, inquired about delivery of home DME( hospital bed, wheelchair and [facility]), spouse reports she has been receiving calls but did not recognize the area code so she did not answer. NCM call to [employer] at [phone] at  bedside with spouse, sw Misty Stanley, delivery of equipment scheduled for tomorrow, no ETA, spouse provided contact number to call after 10:30am ( [phone]) for ETA of delivery. Spouse recorded phone number and veriified her contact number. Spouse had no further questions/concerns, states she will have home ready for equipment delivery. Team notified. TOC will continue to follow.     2:04pm TOC consulted for Home Hospice with [facility] rep-Shanita notified. NCM call to [employer] rep-Jermaine, informed pt now transitioned to hospice, cancelled home DME( hospital bed, [facility] and wheelchair) scheduled for delivery tomorrow. TOC will continue to follow.       Barriers to Discharge: Continued Medical Work up  Expected Discharge Plan and Services   Discharge Planning Services: CM Consult   Living arrangements for the past 2 months: Single Family Home Expected Discharge Date: 11/15/22                                     Social Determinants of Health (SDOH) Interventions SDOH Screenings   Food Insecurity: No Food Insecurity (11/12/2022)  Housing: Low Risk  (11/12/2022)  Transportation Needs: No Transportation Needs (11/12/2022)  Utilities: Not At Risk (11/12/2022)  Alcohol Screen: Low Risk  (09/10/2022)  Depression (PHQ2-9): High Risk (11/09/2022)  Financial Resource Strain: Low Risk  (09/10/2022)  Physical Activity: Inactive (09/10/2022)  Social  Connections: Socially Integrated (09/10/2022)  Stress: No Stress Concern Present (09/10/2022)  Tobacco Use: Medium Risk (11/12/2022)    Readmission Risk Interventions    11/14/2022    2:33 PM  Readmission Risk Prevention Plan  Transportation Screening Complete  PCP or Specialist Appt within 5-7 Days Complete  Home Care Screening Complete  Medication Review (RN CM) Complete

## 2022-11-16 ENCOUNTER — Encounter: Payer: Self-pay | Admitting: *Deleted

## 2022-11-16 DIAGNOSIS — N179 Acute kidney failure, unspecified: Secondary | ICD-10-CM | POA: Diagnosis not present

## 2022-11-16 LAB — COMPREHENSIVE METABOLIC PANEL
ALT: 10 U/L (ref 0–44)
AST: 20 U/L (ref 15–41)
Albumin: 2.9 g/dL — ABNORMAL LOW (ref 3.5–5.0)
Alkaline Phosphatase: 67 U/L (ref 38–126)
Anion gap: 10 (ref 5–15)
BUN: 23 mg/dL (ref 8–23)
CO2: 23 mmol/L (ref 22–32)
Calcium: 10.1 mg/dL (ref 8.9–10.3)
Chloride: 97 mmol/L — ABNORMAL LOW (ref 98–111)
Creatinine, Ser: 1.4 mg/dL — ABNORMAL HIGH (ref 0.61–1.24)
GFR, Estimated: 50 mL/min — ABNORMAL LOW (ref >60)
Glucose, Bld: 130 mg/dL — ABNORMAL HIGH (ref 70–99)
Potassium: 4 mmol/L (ref 3.5–5.1)
Sodium: 130 mmol/L — ABNORMAL LOW (ref 135–145)
Total Bilirubin: 0.2 mg/dL — ABNORMAL LOW (ref 0.3–1.2)
Total Protein: 7 g/dL (ref 6.5–8.1)

## 2022-11-16 LAB — MAGNESIUM: Magnesium: 2 mg/dL (ref 1.7–2.4)

## 2022-11-16 LAB — CBC
HCT: 27.7 % — ABNORMAL LOW (ref 39.0–52.0)
Hemoglobin: 8.9 g/dL — ABNORMAL LOW (ref 13.0–17.0)
MCH: 30 pg (ref 26.0–34.0)
MCHC: 32.1 g/dL (ref 30.0–36.0)
MCV: 93.3 fL (ref 80.0–100.0)
Platelets: 117 10*3/uL — ABNORMAL LOW (ref 150–400)
RBC: 2.97 MIL/uL — ABNORMAL LOW (ref 4.22–5.81)
RDW: 14.3 % (ref 11.5–15.5)
WBC: 3.7 10*3/uL — ABNORMAL LOW (ref 4.0–10.5)
nRBC: 0.5 % — ABNORMAL HIGH (ref 0.0–0.2)

## 2022-11-16 MED ORDER — SODIUM CHLORIDE 0.9 % IV SOLN
INTRAVENOUS | Status: DC
  Administered 2022-11-16: 75 mL/h via INTRAVENOUS

## 2022-11-16 MED ORDER — NYSTATIN 100000 UNIT/ML MT SUSP
5.0000 mL | Freq: Four times a day (QID) | OROMUCOSAL | 0 refills | Status: AC
Start: 2022-11-16 — End: 2022-11-23

## 2022-11-16 NOTE — Plan of Care (Signed)
  Problem: Education: Goal: Knowledge of General Education information will improve Description: Including pain rating scale, medication(s)/side effects and non-pharmacologic comfort measures Outcome: Adequate for Discharge   

## 2022-11-16 NOTE — Progress Notes (Signed)
Per Dr. Truett Perna: Patient being d/c'd home with Hospice. Requests 30 minute video visit on 9/4 at 1:15 pm. Scheduling message sent.

## 2022-11-16 NOTE — Care Management Important Message (Signed)
Important Message  Patient Details No IM Letter given Patient to transition home with Hospice. Name: Aaron [name] MRN: [id number] Date of Birth: 06/08/1935   Medicare Important Message Given:  No     Caren Macadam 11/16/2022, 12:04 PM

## 2022-11-16 NOTE — Discharge Summary (Signed)
Physician Discharge Summary  Aaron [name] ONG:[id number] DOB: Jun 17, 1935 DOA: 11/12/2022  PCP: Tresa Garter, MD  Admit date: 11/12/2022 Discharge date: 11/16/2022  Admitted From: [facility] Disposition:  Home Hospice  Recommendations for Outpatient Follow-up:  Follow up with PCP in 1-2 weeks Please obtain BMP/CBC in one week your next doctors visit.  Home with hospice  Discharge Condition: Stable CODE STATUS: DNR Diet recommendation: Regular  Brief/Interim Summary:  Brief Narrative:  87 year old with history of esophageal cancer treated in 2011 with chemoradiation, inoperable but apparently long-term remission, prostate cancer June 2022 on radiation/hormonal therapy which has recently progressed and became widely metastatic as of October 18, 2022.  Seen by oncology on the day of admission patient has become significantly weaker therefore comes to the hospital. Admitted for hyperCal, started on IVF, and Bisphos therapy.  During the hospitalization with hydration his renal function improved, calcium trended down and normalized.  Eventually family discussed case with oncology and he was transition to go home with hospice services.  Arrangements were made by [facility] team. His CODE STATUS has also been changed to DNR with his wishes.      Assessment & Plan:  Principal Problem:   AKI (acute kidney injury) (HCC) Active Problems:   Malignant neoplasm of prostate (HCC)   Hypercalcemia   CRI (chronic renal insufficiency), stage 3 (moderate) (HCC)   Malignant neoplasm of thoracic esophagus (HCC)   Hypothyroidism   AKI (acute kidney injury) (HCC) on CKD stage III AAA Baseline creatinine 1.4, admission creatinine 2.12.  Slowly improving with IV fluids. Cr today 1.47   Hypercalcemia Admission calcium 12.2, slowly improving.  Creatinine today morning 10.4.  Advised oral hydration In the setting of widespread metastatic cancer.  IV fluids and bisphosphonate therapy.  Vitamin D is normal    Generalized weakness - CT of the head negative.  Elevated TSH, normal free T4.     Malignant neoplasm of prostate [facility]) Now with metastatic disease.  Previously patient has had known squamous cell carcinoma of esophagus in 2011 and thereafter also prostate cancer in June 2022. Will defer cancer treatment to oncology team, Dr. Truett Perna is following this patient. MRI of spine shows multilevel degenerative disease and also abnormal signaling consistent with likely metastatic disease.   Hypothyroidism Continue Synthroid   Malignant neoplasm of thoracic esophagus (HCC) "Remote" history of.  Despite Stage 4 and inoperable, apparently was successfully treated back in 2011 into long term remission with chemo + radiation from what I can tell?   PT/OT-home health, f64f completed.  Nutrition consult Hospice consulted. Family interested.      DVT prophylaxis: enoxaparin (LOVENOX) injection 30 mg Start: 11/13/22 1000 Transition patient home.   Discharge Diagnoses:  Principal Problem:   AKI (acute kidney injury) (HCC) Active Problems:   Malignant neoplasm of prostate (HCC)   Hypercalcemia   CRI (chronic renal insufficiency), stage 3 (moderate) (HCC)   Malignant neoplasm of thoracic esophagus (HCC)   Hypothyroidism   Malnutrition of moderate degree      Consultations: Oncology  Subjective: Feeling well no complaints Daughter is at bedside  Discharge Exam: Vitals:   11/15/22 2100 11/16/22 0619  BP: 103/75 117/72  Pulse: (!) 109 97  Resp: 18 20  Temp: 98.7 F (37.1 C) 98 F (36.7 C)  SpO2: 99% 98%   Vitals:   11/15/22 0411 11/15/22 1527 11/15/22 2100 11/16/22 0619  BP: 123/78 108/73 103/75 117/72  Pulse: (!) 104 (!) 103 (!) 109 97  Resp: 19 16 18 20   Temp:  97.8 F (36.6 C) 97.7 F (36.5 C) 98.7 F (37.1 C) 98 F (36.7 C)  TempSrc: Oral Oral Oral Oral  SpO2: 97% 96% 99% 98%  Weight:      Height:        General: Pt is alert, awake, not in acute  distress Cardiovascular: RRR, S1/S2 +, no rubs, no gallops Respiratory: CTA bilaterally, no wheezing, no rhonchi Abdominal: Soft, NT, ND, bowel sounds + Extremities: no edema, no cyanosis  Discharge Instructions   Allergies as of 11/16/2022       Reactions   Amoxicillin    REACTION: rash   Cefuroxime Axetil    REACTION: nit feeling well   Fluconazole    REACTION: shaking   Escitalopram Other (See Comments)   Nightmares, numbness in tongue, dizziness        Medication List     STOP taking these medications    aspirin EC 81 MG tablet   Erleada 60 MG tablet Generic drug: apalutamide   simvastatin 20 MG tablet Commonly known as: ZOCOR       TAKE these medications    clonazePAM 1 MG tablet Commonly known as: KLONOPIN TAKE 1/2 TO 1 TABLET BY MOUTH  TWICE DAILY AS NEEDED FOR  ANXIETY   docusate sodium 100 MG capsule Commonly known as: COLACE Take 100 mg by mouth daily as needed for moderate constipation.   latanoprost 0.005 % ophthalmic solution Commonly known as: XALATAN Place 1 drop into the left eye at bedtime.   levothyroxine 150 MCG tablet Commonly known as: SYNTHROID Take 1 tablet (150 mcg total) by mouth daily.   metoprolol succinate 25 MG 24 hr tablet Commonly known as: TOPROL-XL TAKE ONE-HALF TABLET BY MOUTH  ALTERNATING WITH 1/4 OF A TABLET BY MOUTH DAILY   nystatin 100000 UNIT/ML suspension Commonly known as: MYCOSTATIN Take 5 mLs (500,000 Units total) by mouth 4 (four) times daily for 7 days.   omeprazole 40 MG capsule Commonly known as: PRILOSEC TAKE 1 CAPSULE BY MOUTH DAILY   Orgovyx 120 MG tablet Generic drug: relugolix 1 po qd What changed:  how much to take how to take this when to take this additional instructions               Durable Medical Equipment  (From admission, onward)           Start     Ordered   11/15/22 0848  For home use only DME Hospital bed  Once       Comments: Therapeutic mattress  Question  Answer Comment  Length of Need Lifetime   Bed type Semi-electric      11/15/22 0847   11/13/22 1327  For home use only DME Shower stool  Once        11/13/22 1326   11/13/22 1327  For home use only DME Bedside commode  Once       Question:  Patient needs a bedside commode to treat with the following condition  Answer:  Ambulatory dysfunction   11/13/22 1326   11/13/22 1126  For home use only DME Walker rolling  Once       Question Answer Comment  Walker: With 5 Inch Wheels   Patient needs a walker to treat with the following condition Ambulatory dysfunction      11/13/22 1125   11/13/22 1126  For home use only DME wheelchair cushion (seat and back)  Once        11/13/22 1125  Follow-up Information     [facility] Follow up.   Why: Home Health Physical Therapy/Occupational Therapy/Home Health Aide Contact information: [address] [place], [place] [address]  Phone: [phone]        [facility] Follow up.   Why: Hospital Bed Bedside Commode Wheelchair Contact information: [address] [place], [place] [address]  Phone: [phone]               Allergies  Allergen Reactions   Amoxicillin     REACTION: rash   Cefuroxime Axetil     REACTION: nit feeling well   Fluconazole     REACTION: shaking   Escitalopram Other (See Comments)    Nightmares, numbness in tongue, dizziness    You were cared for by a hospitalist during your hospital stay. If you have any questions about your discharge medications or the care you received while you were in the hospital after you are discharged, you can call the unit and asked to speak with the hospitalist on call if the hospitalist that took care of you is not available. Once you are discharged, your primary care physician will handle any further medical issues. Please note that no refills for any discharge medications will be authorized once you are discharged, as it is imperative that you return to  your primary care physician (or establish a relationship with a primary care physician if you do not have one) for your aftercare needs so that they can reassess your need for medications and monitor your lab values.  You were cared for by a hospitalist during your hospital stay. If you have any questions about your discharge medications or the care you received while you were in the hospital after you are discharged, you can call the unit and asked to speak with the hospitalist on call if the hospitalist that took care of you is not available. Once you are discharged, your primary care physician will handle any further medical issues. Please note that NO REFILLS for any discharge medications will be authorized once you are discharged, as it is imperative that you return to your primary care physician (or establish a relationship with a primary care physician if you do not have one) for your aftercare needs so that they can reassess your need for medications and monitor your lab values.  Please request your Prim.MD to go over all Hospital Tests and Procedure/Radiological results at the follow up, please get all Hospital records sent to your Prim MD by signing hospital release before you go home.  Get CBC, CMP, 2 view Chest X ray checked  by Primary MD during your next visit or SNF MD in 5-7 days ( we routinely change or add medications that can affect your baseline labs and fluid status, therefore we recommend that you get the mentioned basic workup next visit with your PCP, your PCP may decide not to get them or add new tests based on their clinical decision)  On your next visit with your primary care physician please Get Medicines reviewed and adjusted.  If you experience worsening of your admission symptoms, develop shortness of breath, life threatening emergency, suicidal or homicidal thoughts you must seek medical attention immediately by calling 911 or calling your MD immediately  if symptoms less  severe.  You Must read complete instructions/literature along with all the possible adverse reactions/side effects for all the Medicines you take and that have been prescribed to you. Take any new Medicines after you have completely  understood and accpet all the possible adverse reactions/side effects.   Do not drive, operate heavy machinery, perform activities at heights, swimming or participation in water activities or provide baby sitting services if your were admitted for syncope or siezures until you have seen by Primary MD or a Neurologist and advised to do so again.  Do not drive when taking Pain medications.   Procedures/Studies: MR TOTAL SPINE METS SCREENING  Result Date: 11/13/2022 CLINICAL DATA:  Prostate cancer, metastatic to bone EXAM: MRI TOTAL SPINE WITHOUT AND WITH CONTRAST TECHNIQUE: Multisequence MR imaging of the spine from the cervical spine to the sacrum was performed prior to and following IV contrast administration for evaluation of spinal metastatic disease. CONTRAST:  8mL GADAVIST GADOBUTROL 1 MMOL/ML IV SOLN COMPARISON:  No prior MRI of the spine available, correlation is made with 11/12/2022 CT lumbar spine and 10/18/2022 PET-CT FINDINGS: MRI CERVICAL SPINE FINDINGS Alignment: No significant listhesis. Preservation of the normal cervical lordosis. Vertebrae: Diffusely abnormal marrow signal throughout the cervical spine, which correlates with the diffuse metabolically active lesions seen on the recent PET-CT. Vertebral body heights appear preserved. The axial sequences are somewhat motion limited, however there does not appear to be cord compression secondary to epidural extension of tumor. Cord: Normal signal and morphology.  No abnormal enhancement. Posterior Fossa, vertebral arteries, paraspinal tissues: Grossly normal. Disc levels: C3-C4: Moderate disc bulge. Facet and uncovertebral hypertrophy. Ligamentum flavum hypertrophy. Moderate to severe spinal canal stenosis. Severe  right and moderate to severe left neural foraminal narrowing. C4-C5: Moderate disc bulge. Facet and uncovertebral hypertrophy. Mild-to-moderate spinal canal stenosis. Severe right and moderate to severe left neural foraminal narrowing. C5-C6: Moderate disc bulge. Facet and uncovertebral hypertrophy. Ligamentum flavum hypertrophy. Moderate to severe spinal canal stenosis. Moderate right and mild left neural foraminal narrowing. Milder degenerative changes at other cervical levels. MRI THORACIC SPINE FINDINGS Alignment: No listhesis. Mild S-shaped curvature of the thoracolumbar spine. Vertebrae: Diffusely abnormal marrow signal throughout the thoracic spine. No definite acute fracture. No epidural extension of tumor. Cord:  Normal spinal cord signal.  No abnormal enhancement. Paraspinal and other soft tissues: No acute finding. Disc levels: No significant spinal canal stenosis. MRI LUMBAR SPINE FINDINGS Segmentation: 5 lumbar type vertebral bodies. Lumbarization of S1. The last fully formed disc space is labeled S1-S2. Alignment: Trace anterolisthesis of L5 on S1. S-shaped curvature of the thoracolumbar spine. Vertebrae: Diffusely abnormal marrow signal. No evidence of acute fracture or epidural extension of tumor. Conus medullaris: Extends to the L1 level and appears normal. No abnormal enhancement. Paraspinal and other soft tissues: No definite acute finding given only sagittal images. Redemonstrated bladder diverticulum. Disc levels: No high-grade spinal canal stenosis. IMPRESSION: 1. Diffusely abnormal marrow signal throughout the spine, which correlates with the diffuse metabolically active lesions seen on the 10/18/2022 PET-CT. 2. No evidence of epidural extension of tumor or pathologic cord compression. 3. Degenerative changes result in moderate to severe spinal canal stenosis at C3-C4 and C5-C6. 4. Multilevel neural foraminal narrowing, which is severe on the right at C3-C4 and C4-C5 and moderate to severe on  the left at C3-C4 and C4-C5. 5. No high-grade spinal canal stenosis in the thoracic or lumbar spine. Electronically Signed   By: Wiliam Ke M.D.   On: 11/13/2022 00:36   CT Lumbar Spine Wo Contrast  Result Date: 11/12/2022 CLINICAL DATA:  Fall, back pain EXAM: CT LUMBAR SPINE WITHOUT CONTRAST TECHNIQUE: Multidetector CT imaging of the lumbar spine was performed without intravenous contrast administration. Multiplanar CT image  reconstructions were also generated. RADIATION DOSE REDUCTION: This exam was performed according to the departmental dose-optimization program which includes automated exposure control, adjustment of the mA and/or kV according to patient size and/or use of iterative reconstruction technique. COMPARISON:  Plain films today. FINDINGS: Segmentation: 5 lumbar type vertebrae. Alignment: 4 mm anterolisthesis of L4 on L5 related to degenerative facet disease. Vertebrae: Slight compression fracture the superior endplate of T12. This is age indeterminate. No additional fracture. Diffuse osteopenia. Paraspinal and other soft tissues: Paraspinal soft tissues negative. Large posterior bladder wall diverticulum on the right. Aortic atherosclerosis without aneurysm. Bilateral renal cysts. Disc levels: Advanced degenerative facet disease, most pronounced from L3-4 through L5-S1. no disc herniation. IMPRESSION: Slight compression fracture through the anterior superior endplate of T12, age indeterminate. Degenerative changes, most pronounced in the facets from L3-4 through L5-S1. Electronically Signed   By: Charlett Nose M.D.   On: 11/12/2022 19:21   DG Lumbar Spine 2-3 Views  Result Date: 11/12/2022 CLINICAL DATA:  Fall EXAM: LUMBAR SPINE - 2-3 VIEW COMPARISON:  None Available. FINDINGS: There is 4 mm of anterolisthesis at L4-L5 which is favored as degenerative. Alignment is otherwise anatomic. There is no acute fracture. There significant degenerative changes of facet joints throughout the lumbar  spine. There are atherosclerotic calcifications of the aorta. IMPRESSION: 1. No acute fracture. 2. Grade 1 anterolisthesis at L4-L5 is favored as degenerative. Electronically Signed   By: Darliss Cheney M.D.   On: 11/12/2022 19:09   DG Pelvis 1-2 Views  Result Date: 11/12/2022 CLINICAL DATA:  Fall EXAM: PELVIS - 1-2 VIEW COMPARISON:  None Available. FINDINGS: Hip joints and SI joints symmetric. No acute bony abnormality. Specifically, no fracture, subluxation, or dislocation. IMPRESSION: No acute bony abnormality. Electronically Signed   By: Charlett Nose M.D.   On: 11/12/2022 19:07   CT Head Wo Contrast  Result Date: 11/12/2022 CLINICAL DATA:  Mental status changes.  Fall. EXAM: CT HEAD WITHOUT CONTRAST TECHNIQUE: Contiguous axial images were obtained from the base of the skull through the vertex without intravenous contrast. RADIATION DOSE REDUCTION: This exam was performed according to the departmental dose-optimization program which includes automated exposure control, adjustment of the mA and/or kV according to patient size and/or use of iterative reconstruction technique. COMPARISON:  None Available. FINDINGS: Brain: There is atrophy and chronic small vessel disease changes. No acute intracranial abnormality. Specifically, no hemorrhage, hydrocephalus, mass lesion, acute infarction, or significant intracranial injury. Vascular: No hyperdense vessel or unexpected calcification. Skull: No acute calvarial abnormality. Sinuses/Orbits: No acute findings Other: None IMPRESSION: Atrophy, chronic microvascular disease. No acute intracranial abnormality. Electronically Signed   By: Charlett Nose M.D.   On: 11/12/2022 19:03   DG Chest Port 1 View  Result Date: 11/12/2022 CLINICAL DATA:  Fall, weakness.  History of COPD, former smoker. EXAM: PORTABLE CHEST 1 VIEW COMPARISON:  04/20/2020 FINDINGS: The heart size and mediastinal contours are within normal limits. Both lungs are clear. The visualized skeletal  structures are unremarkable. IMPRESSION: No active disease. Electronically Signed   By: Charlett Nose M.D.   On: 11/12/2022 19:03   NM PET (PSMA) SKULL TO MID THIGH  Result Date: 10/29/2022 CLINICAL DATA:  Prostate cancer. Radiation therapy concluded in November. EXAM: NUCLEAR MEDICINE PET SKULL BASE TO THIGH TECHNIQUE: 12.7 mCi F18 Piflufolastat (Pylarify) was injected intravenously. Full-ring PET imaging was performed from the skull base to thigh after the radiotracer. CT data was obtained and used for attenuation correction and anatomic localization. COMPARISON:  10/14/2020 FINDINGS: NECK  No radiotracer activity in neck lymph nodes. Incidental CT finding: Bilateral carotid atherosclerosis. No cervical adenopathy. CHEST A 4 mm nodule on the left major fissure measures a S.U.V. max of 2.1 on 105/4 and is new. The left hilar focus of presumably nodal hypermetabolism is without adenopathy, new at a S.U.V. max of 11.2 on approximately image 104/4. Incidental CT finding: Aortic and coronary artery calcification. Radiation induced fibrosis in the paramediastinal lungs bilaterally. ABDOMEN/PELVIS Prostate: Prostatic hypermetabolism is significantly decreased. Measures a S.U.V. max of 4.7 today versus a S.U.V. max of 60 on the prior. No residual seminal vesicle hypermetabolism. Lymph nodes: Resolution abdominopelvic nodal hypermetabolism. Physiologic uptake within a celiac ganglion including on 145/4. Liver: No evidence of liver metastasis. Incidental CT finding: Normal adrenal glands. Bilateral low-density renal lesions are likely cysts . In the absence of clinically indicated signs/symptoms require(s) no independent follow-up. Abdominal aortic atherosclerosis. Radiation seeds in the prostate. Right sided bladder diverticulum. SKELETON Diffuse osseous metastasis are new in the interval. Example right proximal femoral lesion at a S.U.V. max of 71. Eccentric left L4 lesion at a S.U.V. max of 43.5. IMPRESSION: 1. Mixed  response to therapy since prior PET 10/14/2020. 2. Response to therapy of prostatic primary and abdominopelvic nodal metastasis. 3. Development of diffuse osseous and probable thoracic nodal metastasis. 4. Incidental findings, including: Coronary artery atherosclerosis. Aortic Atherosclerosis (ICD10-I70.0). Electronically Signed   By: Jeronimo Greaves M.D.   On: 10/29/2022 09:39     The results of significant diagnostics from this hospitalization (including imaging, microbiology, ancillary and laboratory) are listed below for reference.     Microbiology: Recent Results (from the past 240 hour(s))  Urine Culture     Status: None   Collection Time: 11/09/22  3:24 PM   Specimen: Urine, Clean Catch  Result Value Ref Range Status   Specimen Description   Final    URINE, CLEAN CATCH Performed at [facility], [address] [place], [place] [address]    Special Requests   Final    NONE Performed at [facility], [address] [place], [place] [address]    Culture   Final    NO GROWTH Performed at [facility], [address], [place], [place] [address]    Report Status 11/10/2022 FINAL  Final  Resp panel by RT-PCR (RSV, Flu A&B, Covid) Anterior Nasal Swab     Status: None   Collection Time: 11/12/22  6:25 PM   Specimen: Anterior Nasal Swab  Result Value Ref Range Status   SARS Coronavirus 2 by RT PCR NEGATIVE NEGATIVE Final    Comment: (NOTE) SARS-CoV-2 target nucleic acids are NOT DETECTED.  The SARS-CoV-2 RNA is generally detectable in upper respiratory specimens during the acute phase of infection. The lowest concentration of SARS-CoV-2 viral copies this assay can detect is 138 copies/mL. A negative result does not preclude SARS-Cov-2 infection and should not be used as the sole basis for treatment or other patient management decisions. A negative result may occur with  improper specimen collection/handling, submission of specimen  other than nasopharyngeal swab, presence of viral mutation(s) within the areas targeted by this assay, and inadequate number of viral copies(<138 copies/mL). A negative result must be combined with clinical observations, patient history, and epidemiological information. The expected result is Negative.  Fact Sheet for Patients:  BloggerCourse.com  Fact Sheet for Healthcare Providers:  SeriousBroker.it  This test is no t yet approved or cleared by the [place] FDA and  has  been authorized for detection and/or diagnosis of SARS-CoV-2 by FDA under an Emergency Use Authorization (EUA). This EUA will remain  in effect (meaning this test can be used) for the duration of the COVID-19 declaration under Section 564(b)(1) of the Act, 21 U.S.C.section 360bbb-3(b)(1), unless the authorization is terminated  or revoked sooner.       Influenza A by PCR NEGATIVE NEGATIVE Final   Influenza B by PCR NEGATIVE NEGATIVE Final    Comment: (NOTE) The Xpert Xpress SARS-CoV-2/FLU/RSV plus assay is intended as an aid in the diagnosis of influenza from Nasopharyngeal swab specimens and should not be used as a sole basis for treatment. Nasal washings and aspirates are unacceptable for Xpert Xpress SARS-CoV-2/FLU/RSV testing.  Fact Sheet for Patients: BloggerCourse.com  Fact Sheet for Healthcare Providers: SeriousBroker.it  This test is not yet approved or cleared by the [place] FDA and has been authorized for detection and/or diagnosis of SARS-CoV-2 by FDA under an Emergency Use Authorization (EUA). This EUA will remain in effect (meaning this test can be used) for the duration of the COVID-19 declaration under Section 564(b)(1) of the Act, 21 U.S.C. section 360bbb-3(b)(1), unless the authorization is terminated or revoked.     Resp Syncytial Virus by PCR NEGATIVE NEGATIVE Final     Comment: (NOTE) Fact Sheet for Patients: BloggerCourse.com  Fact Sheet for Healthcare Providers: SeriousBroker.it  This test is not yet approved or cleared by the [place] FDA and has been authorized for detection and/or diagnosis of SARS-CoV-2 by FDA under an Emergency Use Authorization (EUA). This EUA will remain in effect (meaning this test can be used) for the duration of the COVID-19 declaration under Section 564(b)(1) of the Act, 21 U.S.C. section 360bbb-3(b)(1), unless the authorization is terminated or revoked.  Performed at [facility], [address] [place], [place] [address]      Labs: BNP (last 3 results) No results for input(s): "BNP" in the last 8760 hours. Basic Metabolic Panel: Recent Labs  Lab 11/12/22 1825 11/13/22 0217 11/14/22 0501 11/15/22 0450 11/16/22 0432  NA 128* 129* 129* 130* 130*  K 3.9 3.4* 3.3* 4.2 4.0  CL 92* 93* 93* 96* 97*  CO2 24 25 24 25 23   GLUCOSE 107* 96 103* 163* 130*  BUN 30* 27* 24* 26* 23  CREATININE 2.12* 1.93* 1.78* 1.47* 1.40*  CALCIUM 12.2* 11.8* 11.1* 10.4* 10.1  MG  --   --  2.0 1.9 2.0  PHOS  --   --  2.7  --   --    Liver Function Tests: Recent Labs  Lab 11/12/22 1825 11/13/22 0217 11/14/22 0501 11/15/22 0450 11/16/22 0432  AST 39 29 23 21 20   ALT 12 11 10 10 10   ALKPHOS 114 99 83 78 67  BILITOT 0.5 0.8 0.4 0.3 0.2*  PROT 8.3* 7.6 7.4 7.5 7.0  ALBUMIN 3.8 3.4* 3.2* 3.1* 2.9*   Recent Labs  Lab 11/12/22 1825  LIPASE 26   Recent Labs  Lab 11/13/22 1040  AMMONIA 20   CBC: Recent Labs  Lab 11/12/22 1825 11/14/22 0501 11/15/22 0450 11/16/22 0432  WBC 5.1 4.6 5.8 3.7*  NEUTROABS 3.8  --   --   --   HGB 10.4* 8.9* 8.8* 8.9*  HCT 31.5* 27.3* 27.1* 27.7*  MCV 93.8 93.2 92.8 93.3  PLT 120* 119* 125* 117*   Cardiac Enzymes: No results for input(s): "CKTOTAL", "CKMB", "CKMBINDEX", "TROPONINI" in the last 168  hours. BNP: Invalid input(s): "POCBNP" CBG: No results  for input(s): "GLUCAP" in the last 168 hours. D-Dimer No results for input(s): "DDIMER" in the last 72 hours. Hgb A1c No results for input(s): "HGBA1C" in the last 72 hours. Lipid Profile No results for input(s): "CHOL", "HDL", "LDLCALC", "TRIG", "CHOLHDL", "LDLDIRECT" in the last 72 hours. Thyroid function studies No results for input(s): "TSH", "T4TOTAL", "T3FREE", "THYROIDAB" in the last 72 hours.  Invalid input(s): "FREET3" Anemia work up Recent Labs    11/14/22 0501  FOLATE 8.4   Urinalysis    Component Value Date/Time   COLORURINE YELLOW 11/09/2022 1524   APPEARANCEUR CLEAR 11/09/2022 1524   LABSPEC 1.018 11/09/2022 1524   LABSPEC 1.005 07/06/2009 1109   PHURINE 5.5 11/09/2022 1524   GLUCOSEU NEGATIVE 11/09/2022 1524   GLUCOSEU NEGATIVE 08/05/2014 0731   HGBUR NEGATIVE 11/09/2022 1524   BILIRUBINUR NEGATIVE 11/09/2022 1524   BILIRUBINUR Negative 07/06/2009 1109   KETONESUR NEGATIVE 11/09/2022 1524   PROTEINUR TRACE (A) 11/09/2022 1524   UROBILINOGEN 0.2 08/05/2014 0731   NITRITE NEGATIVE 11/09/2022 1524   LEUKOCYTESUR NEGATIVE 11/09/2022 1524   LEUKOCYTESUR Negative 07/06/2009 1109   Sepsis Labs Recent Labs  Lab 11/12/22 1825 11/14/22 0501 11/15/22 0450 11/16/22 0432  WBC 5.1 4.6 5.8 3.7*   Microbiology Recent Results (from the past 240 hour(s))  Urine Culture     Status: None   Collection Time: 11/09/22  3:24 PM   Specimen: Urine, Clean Catch  Result Value Ref Range Status   Specimen Description   Final    URINE, CLEAN CATCH Performed at [facility], [address] [place], [place] [address]    Special Requests   Final    NONE Performed at [facility], [address] [place], [place] [address]    Culture   Final    NO GROWTH Performed at [facility], [address] [place], [place] [address]    Report Status 11/10/2022 FINAL  Final   Resp panel by RT-PCR (RSV, Flu A&B, Covid) Anterior Nasal Swab     Status: None   Collection Time: 11/12/22  6:25 PM   Specimen: Anterior Nasal Swab  Result Value Ref Range Status   SARS Coronavirus 2 by RT PCR NEGATIVE NEGATIVE Final    Comment: (NOTE) SARS-CoV-2 target nucleic acids are NOT DETECTED.  The SARS-CoV-2 RNA is generally detectable in upper respiratory specimens during the acute phase of infection. The lowest concentration of SARS-CoV-2 viral copies this assay can detect is 138 copies/mL. A negative result does not preclude SARS-Cov-2 infection and should not be used as the sole basis for treatment or other patient management decisions. A negative result may occur with  improper specimen collection/handling, submission of specimen other than nasopharyngeal swab, presence of viral mutation(s) within the areas targeted by this assay, and inadequate number of viral copies(<138 copies/mL). A negative result must be combined with clinical observations, patient history, and epidemiological information. The expected result is Negative.  Fact Sheet for Patients:  BloggerCourse.com  Fact Sheet for Healthcare Providers:  SeriousBroker.it  This test is no t yet approved or cleared by the [place] FDA and  has been authorized for detection and/or diagnosis of SARS-CoV-2 by FDA under an Emergency Use Authorization (EUA). This EUA will remain  in effect (meaning this test can be used) for the duration of the COVID-19 declaration under Section 564(b)(1) of the Act, 21 U.S.C.section 360bbb-3(b)(1), unless the authorization is terminated  or revoked sooner.       Influenza A by  PCR NEGATIVE NEGATIVE Final   Influenza B by PCR NEGATIVE NEGATIVE Final    Comment: (NOTE) The Xpert Xpress SARS-CoV-2/FLU/RSV plus assay is intended as an aid in the diagnosis of influenza from Nasopharyngeal swab specimens and should not be used  as a sole basis for treatment. Nasal washings and aspirates are unacceptable for Xpert Xpress SARS-CoV-2/FLU/RSV testing.  Fact Sheet for Patients: BloggerCourse.com  Fact Sheet for Healthcare Providers: SeriousBroker.it  This test is not yet approved or cleared by the [place] FDA and has been authorized for detection and/or diagnosis of SARS-CoV-2 by FDA under an Emergency Use Authorization (EUA). This EUA will remain in effect (meaning this test can be used) for the duration of the COVID-19 declaration under Section 564(b)(1) of the Act, 21 U.S.C. section 360bbb-3(b)(1), unless the authorization is terminated or revoked.     Resp Syncytial Virus by PCR NEGATIVE NEGATIVE Final    Comment: (NOTE) Fact Sheet for Patients: BloggerCourse.com  Fact Sheet for Healthcare Providers: SeriousBroker.it  This test is not yet approved or cleared by the [place] FDA and has been authorized for detection and/or diagnosis of SARS-CoV-2 by FDA under an Emergency Use Authorization (EUA). This EUA will remain in effect (meaning this test can be used) for the duration of the COVID-19 declaration under Section 564(b)(1) of the Act, 21 U.S.C. section 360bbb-3(b)(1), unless the authorization is terminated or revoked.  Performed at [facility], [address] [place], [place] [address]      Time coordinating discharge:  I have spent 35 minutes face to face with the patient and on the ward discussing the patients care, assessment, plan and disposition with other care givers. >50% of the time was devoted counseling the patient about the risks and benefits of treatment/Discharge disposition and coordinating care.   SIGNED:   Miguel Rota, MD  [facility] 11/16/2022, 12:57 PM   If 7PM-7AM, please contact night-coverage

## 2022-11-16 NOTE — Progress Notes (Addendum)
IP PROGRESS NOTE  Subjective:   [name] appears comfortable.  His daughter is at the bedside.  He denies pain and dyspnea.   Objective: Vital signs in last 24 hours: Blood pressure 117/72, pulse 97, temperature 98 F (36.7 C), temperature source Oral, resp. rate 20, height 6\' 3"  (1.905 m), weight 176 lb 5.9 oz (80 kg), SpO2 98%.  Intake/Output from previous day: 08/22 0701 - 08/23 0700 In: 1291.1 [P.O.:1080; I.V.:211.1] Out: -   Physical Exam:  HEENT: Minimal white coat over the tongue.  No buccal thrush. Lungs: Clear anteriorly, no respiratory distress Cardiac: Regular rate and rhythm Abdomen: Nontender, no hepatosplenomegaly Extremities: No leg edema Neurologic: Alert, oriented to place and year.  Not oriented to diagnosis.  Moves all extremities to command.  The motor exam appears intact in the upper and lower extremities bilaterally Lab Results: Recent Labs    11/15/22 0450 11/16/22 0432  WBC 5.8 3.7*  HGB 8.8* 8.9*  HCT 27.1* 27.7*  PLT 125* 117*    BMET Recent Labs    11/15/22 0450 11/16/22 0432  NA 130* 130*  K 4.2 4.0  CL 96* 97*  CO2 25 23  GLUCOSE 163* 130*  BUN 26* 23  CREATININE 1.47* 1.40*  CALCIUM 10.4* 10.1    No results found for: "CEA1", "CEA", "ZOX096", "CA125"  Studies/Results: No results found.  Medications: I have reviewed the patient's current medications.  Assessment/Plan: Squamous cell carcinoma of the esophagus, status post an endoscopic biopsy, 04/07/2009, with the pathology confirming at least superficial invasion. PET scan, 04/20/2009, confirmed hypermetabolic activity associated with the upper and thoracic esophagus mass with a single hypermetabolic right paratracheal lymph node. He began concurrent radiation and weekly Taxol/carboplatin chemotherapy, 05/03/2009. The last treatment with Taxol/carboplatin was given 06/14/2009. He completed radiation on 06/16/2009.   Solid/liquid dysphagia secondary to the proximal esophageal  mass, resolved. He is status post an esophageal dilatation for management of a stricture, 08/24/2009, repeat esophageal dilatation 10/03/2011. Borderline enlarged mediastinal and gastrohepatic lymph nodes on staging CT scans. PET scan 04/20/2009 confirmed hypermetabolic activity associated with the right paratracheal lymph node. Hypertension. Status post placement of a feeding jejunostomy tube by Dr. Michaell Cowing. The jejunostomy tube has been removed. Admission with neutropenia and anemia, March 2011.   Fever of unknown origin, April 2011, resolved. He was treated with prednisone beginning on 07/13/2009 for presumed radiation pneumonitis. He developed episodes of rigors on prednisone, and the prednisone was discontinued, 07/15/2009. A CT scan did not reveal evidence for an infectious process. Zoster rash at the right back and anterior chest July 2013. Questionable leukoplakia on exam 04/09/2013-he was evaluated by dental medicine Prostate cancer-Gleason 9 prostate cancer diagnosed in June 2022 Orgovyx August 2022, and apalutamide, September 2022 on the HERO trial 01/12/2021 - 03/14/2021 external beam radiation, 45 Gy in 25 fractions to prostate, seminal vesicles, and pelvic lymph nodes, prostate only boosted to 75 Gy with 15 additional fractions of 2 Gy nodes Rising PSA May 2024 09/11/2022 PSA 12.7, testosterone 16.9 PSMA PET 10/18/2022-significant decrease in hypermetabolism associated with the prostate, abdominopelvic lymph nodes, and seminal vesicles, development of diffuse osseous and probable thoracic lymph node metastases MRI total spine 11/13/2022-diffuse metastatic prostate cancer involving the spine, no evidence of epidural tumor extension or cord compression, spinal stenosis at C3-C4 and C5-C6 secondary to degenerative changes 12.  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  He presented with generalized weakness and altered mental  status.  The altered mental  status is likely related to delirium from metastatic prostate cancer and hypercalcemia.  He has a poor prognosis.  We had planned a trial of Pluvicto radiotherapy in an attempt to improve the metastatic prostate cancer . The hypercalcemia has improved, but he has a poor performance status and does not appear to be a candidate for further treatment of the prostate cancer.  I discussed the prognosis with [name] and his daughter.  I discussed the case with his wife by telephone yesterday.  They are in agreement with hospice care.  They would like to take [name] home.  They understand the challenge of caring for him in the home given his limited ability to ambulate.  We discussed CPR and ACLS.  [name] agrees to a no CODE BLUE status.  His daughter is in support of the no CODE BLUE.  They understand he will not be eligible for additional treatment for prostate cancer such as Pluvicto while enrolled in hospice.   Recommendations:  [facility] referral for home hospice I will be glad to serve as the primary provider with the home hospice team No CODE BLUE A telehealth visit with me will be scheduled within the next 2 weeks. Consolidate medical regimen for comfort           LOS: 3 days   Thornton Papas, MD   11/16/2022, 7:55 AM

## 2022-11-16 NOTE — Progress Notes (Signed)
PT Note  Patient Details Name: Aaron [name] MRN: [id number] DOB: 19-Jun-1935   Cancelled Treatment:    Reason Eval/Treat Not Completed: Other (comment). PT signing off pt is to transition home with hospice services. Thank you for the referral.   Johnny Bridge, PT Acute Rehab  Jacqualyn Posey 11/16/2022, 11:52 AM

## 2022-11-16 NOTE — Progress Notes (Signed)
Daily Progress Note   Patient Name: Aaron [name]       Date: 11/16/2022 DOB: November 04, 1935  Age: 87 y.o. MRN#: [id number] Attending Physician: Miguel Rota, MD Primary Care Physician: Tresa Garter, MD Admit Date: 11/12/2022  Reason for Consultation/Follow-up: Hospice Evaluation  Subjective: Patient is resting in bed, no distress, appears with generalized weakness.No family at bedside. Dr Kalman Drape note reviewed.   Length of Stay: 3  Current Medications: Scheduled Meds:   aspirin EC  81 mg Oral Daily   enoxaparin (LOVENOX) injection  40 mg Subcutaneous Q24H   feeding supplement  237 mL Oral BID BM   latanoprost  1 drop Left Eye QHS   levothyroxine  150 mcg Oral Q0600   multivitamin with minerals  1 tablet Oral Daily   nystatin  5 mL Oral QID   relugolix  240 mg Oral Daily   simvastatin  10 mg Oral Daily    Continuous Infusions:  sodium chloride 75 mL/hr at 11/16/22 0700    PRN Meds: acetaminophen **OR** acetaminophen, guaiFENesin, hydrALAZINE, ipratropium-albuterol, metoprolol tartrate, ondansetron **OR** ondansetron (ZOFRAN) IV, senna-docusate, traZODone  Physical Exam         Awakens easily, no distress Generalized weakness No edema Regular work of breathing.   Vital Signs: BP 117/72 (BP Location: Left Arm)   Pulse 97   Temp 98 F (36.7 C) (Oral)   Resp 20   Ht 6\' 3"  (1.905 m)   Wt 80 kg   SpO2 98%   BMI 22.04 kg/m  SpO2: SpO2: 98 % O2 Device: O2 Device: Room Air O2 Flow Rate:    Intake/output summary:  Intake/Output Summary (Last 24 hours) at 11/16/2022 1153 Last data filed at 11/16/2022 0700 Gross per 24 hour  Intake 931.11 ml  Output --  Net 931.11 ml   LBM: Last BM Date : 11/15/22 Baseline Weight: Weight: 81 kg Most recent weight: Weight: 80  kg       Palliative Assessment/Data:      Patient Active Problem List   Diagnosis Date Noted   Malnutrition of moderate degree 11/13/2022   Hypercalcemia 11/12/2022   AKI (acute kidney injury) (HCC) 11/12/2022   Fatigue 10/22/2022   Hearing loss 08/09/2022   Leg weakness, bilateral 05/21/2022   Gait disturbance 12/14/2020   Malignant neoplasm of prostate (HCC) 11/15/2020  Memory problem 04/20/2020   Constipation 04/20/2020   CRI (chronic renal insufficiency), stage 3 (moderate) (HCC) 10/10/2018   Anxiety 10/24/2017   Acute bronchitis 07/04/2017   Prostate cancer (HCC) 08/30/2016   Wart of scalp 01/31/2015   Cardiac murmur 12/20/2014   Well adult exam 01/22/2013   Neoplasm of uncertain behavior of skin 01/22/2013   CAD (coronary artery disease) 11/27/2012   Cerumen impaction 10/16/2012   Angular stomatitis 03/13/2012   Esophageal cancer (HCC)    Arrhythmia    Knee pain, left 12/27/2010   Hypothyroidism 06/02/2010   Keloid scar 06/02/2010   COPD exacerbation (HCC) 02/24/2010   Cough 02/24/2010   SEBACEOUS CYST, INFECTED 12/20/2009   INVOLUNTARY MOVEMENTS, ABNORMAL 08/17/2009   Malignant neoplasm of thoracic esophagus (HCC) 06/13/2009   Iron deficiency anemia 06/13/2009   FEVER UNSPECIFIED 06/13/2009   Stricture and stenosis of esophagus 04/06/2009   Weight loss 04/06/2009   DYSPHAGIA 04/06/2009   TOBACCO USE, QUIT 03/01/2009   RASH-NONVESICULAR 03/23/2008   ALLERGIC RHINITIS 03/04/2008   CRAMPS,LEG 09/04/2007   Hyperglycemia 09/04/2007   SINUSITIS, ACUTE 02/28/2007   GERD 02/28/2007   Dyslipidemia 01/27/2007   Essential hypertension 01/27/2007   DIVERTICULOSIS, COLON 01/27/2007    Palliative Care Assessment & Plan   Patient Profile:    Assessment:  Metastatic prostrate cancer.  Hypercalcemia of malignancy Poor performance status   Recommendations/Plan:  Agree with DNR Agree with home with hospice.     Code Status:    Code Status Orders   (From admission, onward)           Start     Ordered   11/16/22 0803  Do not attempt resuscitation (DNR)  Continuous       Question Answer Comment  If patient has no pulse and is not breathing Do Not Attempt Resuscitation   If patient has a pulse and/or is breathing: Medical Treatment Goals COMFORT MEASURES: Keep clean/warm/dry, use medication by any route; positioning, wound care and other measures to relieve pain/suffering; use oxygen, suction/manual treatment of airway obstruction for comfort; do not transfer unless for comfort needs.   Consent: Discussion documented in EHR or advanced directives reviewed      11/16/22 0802           Code Status History     Date Active Date Inactive Code Status Order ID Comments User Context   11/13/2022 0035 11/16/2022 0802 Full Code [id number]  Hillary Bow, DO Inpatient      Advance Directive Documentation    Flowsheet Row Most Recent Value  Type of Advance Directive Living will  Pre-existing out of facility DNR order (yellow form or pink MOST form) --  "MOST" Form in Place? --       Prognosis:  < 6 months  Discharge Planning: Home with Hospice  Care plan was discussed with IDT   Thank you for allowing the Palliative Medicine Team to assist in the care of this patient.  Low MDM.      Greater than 50%  of this time was spent counseling and coordinating care related to the above assessment and plan.  Rosalin Hawking, MD  Please contact Palliative Medicine Team phone at [phone] for questions and concerns.

## 2022-11-16 NOTE — Plan of Care (Signed)
  Problem: Nutrition: Goal: Adequate nutrition will be maintained Outcome: Progressing   Problem: Coping: Goal: Level of anxiety will decrease Outcome: Progressing   Problem: Elimination: Goal: Will not experience complications related to bowel motility Outcome: Not Progressing   Problem: Pain Managment: Goal: General experience of comfort will improve Outcome: Progressing

## 2022-11-16 NOTE — Progress Notes (Signed)
OT Cancellation Note  Patient Details Name: Aaron [name] MRN: [id number] DOB: 1935/11/02   Cancelled Treatment:    Reason Eval/Treat Not Completed: Other (comment).  Pt to transition home with hospice services. OT will sign off Galen Manila 11/16/2022, 11:56 AM

## 2022-11-16 NOTE — TOC Transition Note (Signed)
Transition of Care [facility]) - CM/SW Discharge Note   Patient Details  Name: Aaron [name] MRN: [id number] Date of Birth: 1936-02-07  [facility] CM/SW Contact:  Howell Rucks, RN Phone Number: 11/16/2022, 12:40 PM   Clinical Narrative: Call to pt's spouse Steward Drone) confirmed all equipment delivered today ( hospital bed, BSC, wheelchair) and in place, states they are ready for pt to dc today. [facility] for transport.  No further TOC needs identified.       Final next level of care: Home w Hospice Care Barriers to Discharge: No Barriers Identified   Patient Goals and CMS Choice CMS Medicare.gov Compare Post Acute Care list provided to:: Patient Represenative (must comment) (Waidelich,Brenda (Spouse)  [phone] (Mobile)    Discharge Placement                  Patient to be transferred to facility by: [facility] Name of family member notified: Opal, Casebeer (Spouse)  [phone] (Mobile Patient and family notified of of transfer: 11/16/22  Discharge Plan and Services Additional resources added to the After Visit Summary for     Discharge Planning Services: CM Consult                                 Social Determinants of Health (SDOH) Interventions SDOH Screenings   Food Insecurity: No Food Insecurity (11/12/2022)  Housing: Low Risk  (11/12/2022)  Transportation Needs: No Transportation Needs (11/12/2022)  Utilities: Not At Risk (11/12/2022)  Alcohol Screen: Low Risk  (09/10/2022)  Depression (PHQ2-9): High Risk (11/09/2022)  Financial Resource Strain: Low Risk  (09/10/2022)  Physical Activity: Inactive (09/10/2022)  Social Connections: Socially Integrated (09/10/2022)  Stress: No Stress Concern Present (09/10/2022)  Tobacco Use: Medium Risk (11/12/2022)     Readmission Risk Interventions    11/14/2022    2:33 PM  Readmission Risk Prevention Plan  Transportation Screening Complete  PCP or Specialist Appt within 5-7 Days Complete  Home Care Screening  Complete  Medication Review (RN CM) Complete

## 2022-11-16 NOTE — Progress Notes (Signed)
Patient discharged home via ambulance, discharge instructions explained to wife over the phone and copy given to [facility] driver transporter for wife. Patient alert, in no distress. Patient's belonging and home medication given to [facility] driver for home.

## 2022-11-17 ENCOUNTER — Other Ambulatory Visit (HOSPITAL_COMMUNITY): Payer: Self-pay

## 2022-11-18 DIAGNOSIS — K219 Gastro-esophageal reflux disease without esophagitis: Secondary | ICD-10-CM | POA: Diagnosis not present

## 2022-11-18 DIAGNOSIS — I10 Essential (primary) hypertension: Secondary | ICD-10-CM | POA: Diagnosis not present

## 2022-11-18 DIAGNOSIS — D001 Carcinoma in situ of esophagus: Secondary | ICD-10-CM | POA: Diagnosis not present

## 2022-11-18 DIAGNOSIS — E785 Hyperlipidemia, unspecified: Secondary | ICD-10-CM | POA: Diagnosis not present

## 2022-11-18 DIAGNOSIS — D075 Carcinoma in situ of prostate: Secondary | ICD-10-CM | POA: Diagnosis not present

## 2022-11-18 DIAGNOSIS — E031 Congenital hypothyroidism without goiter: Secondary | ICD-10-CM | POA: Diagnosis not present

## 2022-11-18 DIAGNOSIS — N401 Enlarged prostate with lower urinary tract symptoms: Secondary | ICD-10-CM | POA: Diagnosis not present

## 2022-11-19 DIAGNOSIS — D075 Carcinoma in situ of prostate: Secondary | ICD-10-CM | POA: Diagnosis not present

## 2022-11-19 DIAGNOSIS — N401 Enlarged prostate with lower urinary tract symptoms: Secondary | ICD-10-CM | POA: Diagnosis not present

## 2022-11-19 DIAGNOSIS — D001 Carcinoma in situ of esophagus: Secondary | ICD-10-CM | POA: Diagnosis not present

## 2022-11-19 DIAGNOSIS — I10 Essential (primary) hypertension: Secondary | ICD-10-CM | POA: Diagnosis not present

## 2022-11-19 DIAGNOSIS — E785 Hyperlipidemia, unspecified: Secondary | ICD-10-CM | POA: Diagnosis not present

## 2022-11-19 DIAGNOSIS — K219 Gastro-esophageal reflux disease without esophagitis: Secondary | ICD-10-CM | POA: Diagnosis not present

## 2022-11-20 DIAGNOSIS — D001 Carcinoma in situ of esophagus: Secondary | ICD-10-CM | POA: Diagnosis not present

## 2022-11-20 DIAGNOSIS — D075 Carcinoma in situ of prostate: Secondary | ICD-10-CM | POA: Diagnosis not present

## 2022-11-20 DIAGNOSIS — K219 Gastro-esophageal reflux disease without esophagitis: Secondary | ICD-10-CM | POA: Diagnosis not present

## 2022-11-20 DIAGNOSIS — E785 Hyperlipidemia, unspecified: Secondary | ICD-10-CM | POA: Diagnosis not present

## 2022-11-20 DIAGNOSIS — I10 Essential (primary) hypertension: Secondary | ICD-10-CM | POA: Diagnosis not present

## 2022-11-20 DIAGNOSIS — N401 Enlarged prostate with lower urinary tract symptoms: Secondary | ICD-10-CM | POA: Diagnosis not present

## 2022-11-21 DIAGNOSIS — D075 Carcinoma in situ of prostate: Secondary | ICD-10-CM | POA: Diagnosis not present

## 2022-11-21 DIAGNOSIS — N401 Enlarged prostate with lower urinary tract symptoms: Secondary | ICD-10-CM | POA: Diagnosis not present

## 2022-11-21 DIAGNOSIS — D001 Carcinoma in situ of esophagus: Secondary | ICD-10-CM | POA: Diagnosis not present

## 2022-11-21 DIAGNOSIS — I10 Essential (primary) hypertension: Secondary | ICD-10-CM | POA: Diagnosis not present

## 2022-11-21 DIAGNOSIS — K219 Gastro-esophageal reflux disease without esophagitis: Secondary | ICD-10-CM | POA: Diagnosis not present

## 2022-11-21 DIAGNOSIS — E785 Hyperlipidemia, unspecified: Secondary | ICD-10-CM | POA: Diagnosis not present

## 2022-11-22 DIAGNOSIS — N401 Enlarged prostate with lower urinary tract symptoms: Secondary | ICD-10-CM | POA: Diagnosis not present

## 2022-11-22 DIAGNOSIS — I10 Essential (primary) hypertension: Secondary | ICD-10-CM | POA: Diagnosis not present

## 2022-11-22 DIAGNOSIS — E785 Hyperlipidemia, unspecified: Secondary | ICD-10-CM | POA: Diagnosis not present

## 2022-11-22 DIAGNOSIS — D075 Carcinoma in situ of prostate: Secondary | ICD-10-CM | POA: Diagnosis not present

## 2022-11-22 DIAGNOSIS — D001 Carcinoma in situ of esophagus: Secondary | ICD-10-CM | POA: Diagnosis not present

## 2022-11-22 DIAGNOSIS — K219 Gastro-esophageal reflux disease without esophagitis: Secondary | ICD-10-CM | POA: Diagnosis not present

## 2022-11-23 DIAGNOSIS — K219 Gastro-esophageal reflux disease without esophagitis: Secondary | ICD-10-CM | POA: Diagnosis not present

## 2022-11-23 DIAGNOSIS — D001 Carcinoma in situ of esophagus: Secondary | ICD-10-CM | POA: Diagnosis not present

## 2022-11-23 DIAGNOSIS — E785 Hyperlipidemia, unspecified: Secondary | ICD-10-CM | POA: Diagnosis not present

## 2022-11-23 DIAGNOSIS — I10 Essential (primary) hypertension: Secondary | ICD-10-CM | POA: Diagnosis not present

## 2022-11-23 DIAGNOSIS — D075 Carcinoma in situ of prostate: Secondary | ICD-10-CM | POA: Diagnosis not present

## 2022-11-23 DIAGNOSIS — N401 Enlarged prostate with lower urinary tract symptoms: Secondary | ICD-10-CM | POA: Diagnosis not present

## 2022-11-24 DIAGNOSIS — D075 Carcinoma in situ of prostate: Secondary | ICD-10-CM | POA: Diagnosis not present

## 2022-11-24 DIAGNOSIS — E785 Hyperlipidemia, unspecified: Secondary | ICD-10-CM | POA: Diagnosis not present

## 2022-11-24 DIAGNOSIS — D001 Carcinoma in situ of esophagus: Secondary | ICD-10-CM | POA: Diagnosis not present

## 2022-11-24 DIAGNOSIS — N401 Enlarged prostate with lower urinary tract symptoms: Secondary | ICD-10-CM | POA: Diagnosis not present

## 2022-11-24 DIAGNOSIS — I10 Essential (primary) hypertension: Secondary | ICD-10-CM | POA: Diagnosis not present

## 2022-11-24 DIAGNOSIS — K219 Gastro-esophageal reflux disease without esophagitis: Secondary | ICD-10-CM | POA: Diagnosis not present

## 2022-11-25 DEATH — deceased

## 2022-11-26 ENCOUNTER — Other Ambulatory Visit: Payer: Self-pay | Admitting: Internal Medicine

## 2022-11-28 ENCOUNTER — Inpatient Hospital Stay: Payer: Medicare Other | Admitting: Oncology

## 2022-11-28 ENCOUNTER — Encounter: Payer: Self-pay | Admitting: Oncology

## 2022-11-28 ENCOUNTER — Encounter: Payer: Self-pay | Admitting: *Deleted

## 2022-11-28 ENCOUNTER — Telehealth: Payer: Self-pay

## 2022-11-28 NOTE — Telephone Encounter (Signed)
FYI: It has been reported that the pt has Passed away on 11-25-2022.

## 2022-11-28 NOTE — Progress Notes (Signed)
Notified by triage nurse w/[facility] that patient died on 2022-12-04. MD aware.

## 2022-11-30 ENCOUNTER — Other Ambulatory Visit: Payer: Self-pay | Admitting: Cardiovascular Disease

## 2022-12-05 ENCOUNTER — Inpatient Hospital Stay: Payer: Medicare Other | Admitting: Nurse Practitioner

## 2022-12-17 NOTE — Telephone Encounter (Signed)
Noted. Thx.

## 2022-12-20 ENCOUNTER — Other Ambulatory Visit (HOSPITAL_BASED_OUTPATIENT_CLINIC_OR_DEPARTMENT_OTHER): Payer: Self-pay

## 2022-12-24 ENCOUNTER — Ambulatory Visit: Payer: Medicare Other | Admitting: Internal Medicine

## 2023-02-13 ENCOUNTER — Ambulatory Visit: Payer: Medicare Other | Admitting: Cardiovascular Disease

## 2023-09-21 ENCOUNTER — Other Ambulatory Visit (HOSPITAL_COMMUNITY): Payer: Self-pay
